# Patient Record
Sex: Male | Born: 1950 | Race: White
Health system: Southern US, Community
[De-identification: ages and names within clinical notes are randomized; demographics above are authoritative.]

## PROBLEM LIST (undated history)

## (undated) DIAGNOSIS — G459 Transient cerebral ischemic attack, unspecified: Secondary | ICD-10-CM

## (undated) DIAGNOSIS — R209 Unspecified disturbances of skin sensation: Secondary | ICD-10-CM

## (undated) DIAGNOSIS — M542 Cervicalgia: Secondary | ICD-10-CM

## (undated) DIAGNOSIS — I495 Sick sinus syndrome: Secondary | ICD-10-CM

## (undated) DIAGNOSIS — R0600 Dyspnea, unspecified: Secondary | ICD-10-CM

## (undated) DIAGNOSIS — R0609 Other forms of dyspnea: Secondary | ICD-10-CM

## (undated) DIAGNOSIS — I4891 Unspecified atrial fibrillation: Secondary | ICD-10-CM

## (undated) DIAGNOSIS — Z85828 Personal history of other malignant neoplasm of skin: Secondary | ICD-10-CM

## (undated) DIAGNOSIS — F419 Anxiety disorder, unspecified: Secondary | ICD-10-CM

## (undated) DIAGNOSIS — F32A Depression, unspecified: Secondary | ICD-10-CM

## (undated) DIAGNOSIS — I10 Essential (primary) hypertension: Secondary | ICD-10-CM

## (undated) DIAGNOSIS — E785 Hyperlipidemia, unspecified: Secondary | ICD-10-CM

## (undated) DIAGNOSIS — G43909 Migraine, unspecified, not intractable, without status migrainosus: Secondary | ICD-10-CM

## (undated) DIAGNOSIS — I77810 Thoracic aortic ectasia: Secondary | ICD-10-CM

## (undated) DIAGNOSIS — M549 Dorsalgia, unspecified: Secondary | ICD-10-CM

## (undated) DIAGNOSIS — J479 Bronchiectasis, uncomplicated: Secondary | ICD-10-CM

## (undated) DIAGNOSIS — I499 Cardiac arrhythmia, unspecified: Secondary | ICD-10-CM

## (undated) DIAGNOSIS — Z950 Presence of cardiac pacemaker: Secondary | ICD-10-CM

## (undated) DIAGNOSIS — N200 Calculus of kidney: Secondary | ICD-10-CM

## (undated) HISTORY — DX: Depression, unspecified: F32.A

## (undated) HISTORY — PX: CARDIAC PACEMAKER PLACEMENT: SHX583

## (undated) HISTORY — DX: Unspecified disturbances of skin sensation: R20.9

## (undated) HISTORY — DX: Other disorders of bilirubin metabolism: E80.6

## (undated) HISTORY — DX: Bronchiectasis, uncomplicated: J47.9

## (undated) HISTORY — DX: Transient cerebral ischemic attack, unspecified: G45.9

## (undated) HISTORY — DX: Migraine, unspecified, not intractable, without status migrainosus: G43.909

## (undated) HISTORY — DX: Cervicalgia: M54.2

## (undated) HISTORY — DX: Personal history of other malignant neoplasm of skin: Z85.828

## (undated) HISTORY — DX: Thoracic aortic ectasia: I77.810

## (undated) HISTORY — DX: Anxiety disorder, unspecified: F41.9

## (undated) HISTORY — DX: Presence of cardiac pacemaker: Z95.0

## (undated) HISTORY — DX: Dorsalgia, unspecified: M54.9

## (undated) SURGERY — CARDIAC CATHETERIZATION
Anesthesia: Local

---

## 1999-02-09 ENCOUNTER — Emergency Department: Admit: 1999-02-09 | Payer: Self-pay | Admitting: Emergency Medicine

## 2006-05-10 ENCOUNTER — Ambulatory Visit
Admit: 2006-05-10 | Disposition: A | Discharge status: Home / Self Care | Payer: Self-pay | Source: Ambulatory Visit | Admitting: Internal Medicine

## 2013-02-07 ENCOUNTER — Ambulatory Visit
Admission: RE | Admit: 2013-02-07 | Discharge: 2013-02-07 | Disposition: A | Discharge status: Home / Self Care | Payer: No Typology Code available for payment source | Source: Ambulatory Visit | Attending: Cardiology | Admitting: Cardiology

## 2013-02-07 DIAGNOSIS — I10 Essential (primary) hypertension: Secondary | ICD-10-CM | POA: Insufficient documentation

## 2013-02-07 DIAGNOSIS — I4891 Unspecified atrial fibrillation: Secondary | ICD-10-CM | POA: Insufficient documentation

## 2013-02-07 DIAGNOSIS — R079 Chest pain, unspecified: Secondary | ICD-10-CM | POA: Insufficient documentation

## 2013-02-07 DIAGNOSIS — I495 Sick sinus syndrome: Secondary | ICD-10-CM | POA: Insufficient documentation

## 2013-02-07 LAB — TSH: TSH: 3.18 (ref 0.35–4.94)

## 2013-02-22 ENCOUNTER — Emergency Department: Payer: No Typology Code available for payment source

## 2013-02-22 ENCOUNTER — Emergency Department
Admission: EM | Admit: 2013-02-22 | Discharge: 2013-02-22 | Disposition: A | Discharge status: Home / Self Care | Payer: No Typology Code available for payment source | Attending: Emergency Medicine | Admitting: Emergency Medicine

## 2013-02-22 DIAGNOSIS — I4891 Unspecified atrial fibrillation: Secondary | ICD-10-CM | POA: Insufficient documentation

## 2013-02-22 DIAGNOSIS — R9389 Abnormal findings on diagnostic imaging of other specified body structures: Secondary | ICD-10-CM | POA: Insufficient documentation

## 2013-02-22 DIAGNOSIS — I10 Essential (primary) hypertension: Secondary | ICD-10-CM | POA: Insufficient documentation

## 2013-02-22 DIAGNOSIS — R059 Cough, unspecified: Secondary | ICD-10-CM | POA: Insufficient documentation

## 2013-02-22 DIAGNOSIS — R042 Hemoptysis: Secondary | ICD-10-CM | POA: Insufficient documentation

## 2013-02-22 HISTORY — DX: Essential (primary) hypertension: I10

## 2013-02-22 HISTORY — DX: Unspecified atrial fibrillation: I48.91

## 2013-02-22 HISTORY — DX: Sick sinus syndrome: I49.5

## 2013-02-22 MED ORDER — HYDROCOD POLST-CPM POLST ER 10-8 MG/5ML PO LQCR
5.0000 mL | Freq: Two times a day (BID) | ORAL | Status: DC | PRN
Start: 2013-02-22 — End: 2013-08-23

## 2013-02-22 MED ORDER — HYDROCODONE-ACETAMINOPHEN 5-325 MG PO TABS
1.0000 | ORAL_TABLET | Freq: Once | ORAL | Status: AC
Start: 2013-02-22 — End: 2013-02-22
  Administered 2013-02-22: 1 via ORAL
  Filled 2013-02-22: qty 1

## 2013-02-22 MED ORDER — TUBERCULIN PPD 5 UNIT/0.1ML ID SOLN
0.1000 mL | Freq: Once | INTRADERMAL | Status: AC
Start: 2013-02-22 — End: 2013-02-22
  Administered 2013-02-22: 0.1 mL via INTRADERMAL
  Filled 2013-02-22: qty 0.1

## 2013-02-22 MED ORDER — GUAIFENESIN 100 MG/5ML PO SOLN
400.0000 mg | Freq: Once | ORAL | Status: AC
Start: 2013-02-22 — End: 2013-02-22
  Administered 2013-02-22: 400 mg via ORAL
  Filled 2013-02-22: qty 20

## 2013-02-22 MED ORDER — CEFUROXIME AXETIL 250 MG PO TABS
500.0000 mg | ORAL_TABLET | Freq: Once | ORAL | Status: AC
Start: 2013-02-22 — End: 2013-02-22
  Administered 2013-02-22: 500 mg via ORAL
  Filled 2013-02-22: qty 2

## 2013-02-22 MED ORDER — DEXTROMETHORPHAN-GUAIFENESIN ER 30-600 MG PO TB12
1.0000 | ORAL_TABLET | Freq: Two times a day (BID) | ORAL | Status: DC
Start: 2013-02-22 — End: 2013-08-23

## 2013-02-22 MED ORDER — CEFUROXIME AXETIL 500 MG PO TABS
500.0000 mg | ORAL_TABLET | Freq: Two times a day (BID) | ORAL | Status: AC
Start: 2013-02-22 — End: 2013-03-01

## 2013-02-22 NOTE — ED Provider Notes (Signed)
Physician/Midlevel provider first contact with patient: 02/22/13 0058         EMERGENCY DEPARTMENT NOTE    Physician/Midlevel provider first contact with patient: 02/22/13 0058         HISTORY OF PRESENT ILLNESS   Historian:Patient  Translator Used: No    HPI: This is a 62 y.o. male with Hx of HTN and AFib and in visit with wife is here with complaints of URI symptoms and severe heavy coughing spells with  hemoptysis x1 hour ago. Pt has been experiencing cold-like Sx contracted from grandchildren for the past x3 days including rhinorrhea and nasal congestion. He is currently on blood thinners for recent Dx of AFib. Pt was advised by physician to take cold and cough alka-seltzer with last dose at 2330, with mild relief. He then began to have Sx of hemoptysis, coughing 1 tbsp of serosanguinous fluid. No CP, SOB, vomiting.    1. Chief Complaint: hemoptysis  2. Onset of symptoms: x1 hour  3. What was patient doing when symptoms started (Context): + sick contacts.  Pt has also been working at [employer] and jails daily   4. Severity: moderate  5. Timing: constant  6. Activities that worsen symptoms: none  7. Activities that improve symptoms: none  8. Quality: aching  9. Radiation of symptoms: none  10. Associated signs and Symptoms: Associated Cough, body aches, rhinorrhea and nasal congestion. Denies SOB and wheezing  11. Are symptoms worsening? no  MEDICAL HISTORY     Past Medical History:  Past Medical History   Diagnosis Date   . Hypertension    . Sick sinus syndrome    . Atrial fibrillation        Past Surgical History:  History reviewed. No pertinent past surgical history.    Social History:  History     Social History   . Marital Status: Married     Spouse Name: N/A     Number of Children: N/A   . Years of Education: N/A     Occupational History   . Not on file.     Social History Main Topics   . Smoking status: Never Smoker    . Smokeless tobacco: Not on file   . Alcohol Use: Yes      Comment: socially   .  Drug Use: No   . Sexually Active: Not on file     Other Topics Concern   . Not on file     Social History Narrative   . No narrative on file       Family History:  Family History   Problem Relation Age of Onset   . Hypertension Mother    . Heart disease Father    . Stroke Father        Outpatient Medication:  Previous Medications    APIXABAN (ELIQUIS) 5 MG    Take 5 mg by mouth every 12 (twelve) hours.    ASPIRIN-SOD BICARB-CITRIC ACID (ALKA-SELTZER) 325 MG EFFER TAB    Take 325 mg by mouth every 6 (six) hours as needed.    LISINOPRIL (PRINIVIL,ZESTRIL) 10 MG TABLET    Take 10 mg by mouth daily.         REVIEW OF SYSTEMS     Review of Systems   Constitutional: Positive for fever, chills and body aches/ftigue  HENT: Positive nasal congestion and rhinorrhea. Negative for  Ear pains: Negative for eye discharge  Respiratory: Positive  for cough. Negative shortness of breath and  wheezing  . Positive for hemoptysis.  Cardiovascular: Negative for chest pain  Gastrointestinal: Negative for  Nausea, vomiting and diarrhea  Musculoskeletal: Positive generalized rash.   All other systems reviewed and are negative.      PHYSICAL EXAM     Filed Vitals:    02/22/13 0051   BP: 156/78   Pulse: 67   Temp: 98.2 F (36.8 C)   Resp: 20   SpO2: 98%       Physical Exam   Nursing note and vitals reviewed.  Constitutional: Pt is well-developed, well-nourished  In minimal pain distress. Posterior pharyngeal wall shows blood clot stringing.   Head:  Normocephalic and atraumatic.   Nose: Clear rhinorrhea with  crusting and  bilateral nasal mucosal edema  Mouth/Throat: Oropharynx is erythematous and slightly injected. No exudate present.    Ear-TM - clear  Eyes: Conjunctivae normal and EOM are normal. Pupils are equal, round, and reactive to light.   Neck: Normal range of motion. Neck supple. No mass   Cardiovascular: Regular rate, irregular rhythm, and normal heart sounds.    Pulmonary/Chest: Effort normal and breath sounds normal. No  respiratory distress.   Abdominal: Soft. Normal appearance and bowel sounds are normal. Pt exhibits no distension. There is no tenderness.   Musculoskeletal: Normal range of motion. Pt  exhibits no edema and no tenderness.   Lymphadenopathy:     Pt has no anterior cervical adenopathy.   Neurological: No focal neuro deficits.   Skin: Skin is warm, dry and intact. No rash noted.    MEDICAL DECISION MAKING   MDM:  URI with flu-like symptoms, Fever, Cough  DDx:  Influenza,  URI, Strep Throat, Allergic Rhinitis,  PLAN:  CXR, cough/ pain med and fever control PRN  reassess.  CT due to abnormal CXR and consult Infectious DZ due to concern for TB    REASSESSMENT   Symptoms: feels improved  Exam: resting comfortably-      RUE:AVWU/ Bronchitissymptoms - Patient presents with upper respiratory and flulike symptoms. Based on my assessment in the ED, I do not suspect any respiratory, airway, pulmonary, cardiovascular (including myocarditis), metabolic, CNS, medical, or surgical emergency medical condition. I have discussed with the patient and/or caregiver signs and symptoms for secondary bacterial infections, such as pneumonia. I believe that the patient's symptoms are most consistent with a viral illness, possibly influenza. Patient is safe for discharge home with conservative therapy.     I have discussed the physical findings, labs, radiological findings, diagnosis and plan of care with the patient and/or family and they have verbally expressed understanding and agreement with this management.        DISCUSSION        Vital Signs: Reviewed the patient?s vital signs.   Nursing Notes: Reviewed and utilized available nursing notes.  Medical Records Reviewed: Reviewed available past medical records.  Counseling: The emergency provider has spoken with the patient and discussed today?s findings, in addition to providing specific details for the plan of care.  Questions are answered and there is agreement with the plan.       CONSULTATIONS     DrDan Humphreys- ID Specialist.  Instructed to place ppd, start ceftin, send sputum cx and refer to Pulmonary for further evaluation.  TB less likely due to lack of fever, weight loss and sweats        IMAGING STUDIES    The following imaging studies were independently interpreted by the Emergency Medicine Physician.  For full imaging  study results please see chart.        PULSE OXIMETRY    Oxygen Saturation by Pulse Oximetry: 98%  Interventions: none  Interpretation: normal  Interpreted independently by Emergency Physician      EMERGENCY DEPT. MEDICATIONS      ED Medication Orders     None          LABORATORY RESULTS    Ordered and independently interpreted AVAILABLE laboratory tests. Please see results section in chart for full details.  Results for orders placed during the hospital encounter of 02/07/13   TSH       Component Value Range    Thyroid Stimulating Hormone 3.18  0.35 - 4.94          CRITICAL CARE        ATTESTATIONS      I have discussed the physical findings available labs,  diagnosis and plan of care with the patient and/or family and they have verbally expressed understanding and agreement with this management.    DIAGNOSIS      Diagnosis:  Final diagnoses:   None       Disposition:  ED Disposition     None          Prescriptions      Varney Daily, MD  02/22/13 [phone]

## 2013-02-22 NOTE — ED Notes (Signed)
Ambulatory alert orientedx4 no sob, c/o of colds for 2-3 days, today at midnite started to cough out blood.bright blood , thick and clotted.

## 2013-02-22 NOTE — ED Notes (Signed)
Patient was moved from room 13 to room 10 for positive pressure room.

## 2013-02-22 NOTE — ED Notes (Signed)
Patient states that his throat pain feels better after the medication

## 2013-02-24 ENCOUNTER — Emergency Department: Payer: No Typology Code available for payment source

## 2013-02-24 ENCOUNTER — Emergency Department
Admission: EM | Admit: 2013-02-24 | Discharge: 2013-02-24 | Disposition: A | Discharge status: Home / Self Care | Payer: No Typology Code available for payment source | Attending: Emergency Medicine | Admitting: Emergency Medicine

## 2013-02-24 DIAGNOSIS — Z111 Encounter for screening for respiratory tuberculosis: Secondary | ICD-10-CM | POA: Insufficient documentation

## 2013-02-24 DIAGNOSIS — I10 Essential (primary) hypertension: Secondary | ICD-10-CM | POA: Insufficient documentation

## 2013-02-24 DIAGNOSIS — R059 Cough, unspecified: Secondary | ICD-10-CM | POA: Insufficient documentation

## 2013-02-24 DIAGNOSIS — I4891 Unspecified atrial fibrillation: Secondary | ICD-10-CM | POA: Insufficient documentation

## 2013-02-24 DIAGNOSIS — R042 Hemoptysis: Secondary | ICD-10-CM | POA: Insufficient documentation

## 2013-02-24 NOTE — ED Notes (Signed)
Pt was here two days ago for cough evalv.; Cough is improving; here with sputum sample to have PPD read.

## 2013-02-24 NOTE — ED Provider Notes (Signed)
Physician/Midlevel provider first contact with patient: 02/24/13 0501         EMERGENCY DEPARTMENT NOTE    Physician/Midlevel provider first contact with patient: 02/24/13 0501         HISTORY OF PRESENT ILLNESS   Historian:Patient  Translator Used: No    HPI: This is a 62 y.o. male  here with complaints of Cough seen in ED 2 days ago for hemoptysis and had sputums sent and PPD placed.  Cough improved.  Hemoptysis decreasing, now more clear sputum expectorated    1. Chief Complaint: Cough  2. Onset of symptoms:  day  3. What was patient doing when symptoms started (Context): denies trauma.   4. Severity: improving  5. Timing: constant  6. Activities that worsen symptoms:none  7. Activities that improve symptoms: none  8. Quality: aching  9. Radiation of symptoms:none  10. Associated signs and Symptoms: No dizziness,  No F/C, focal numbness or weakness  11. Are symptoms worsening? no  MEDICAL HISTORY     Past Medical History:  Past Medical History   Diagnosis Date   . Hypertension    . Sick sinus syndrome    . Atrial fibrillation        Past Surgical History:  History reviewed. No pertinent past surgical history.    Social History:  History     Social History   . Marital Status: Married     Spouse Name: N/A     Number of Children: N/A   . Years of Education: N/A     Occupational History   . Not on file.     Social History Main Topics   . Smoking status: Never Smoker    . Smokeless tobacco: Not on file   . Alcohol Use: Yes      Comment: socially   . Drug Use: No   . Sexually Active: Not on file     Other Topics Concern   . Not on file     Social History Narrative   . No narrative on file       Family History:  Family History   Problem Relation Age of Onset   . Hypertension Mother    . Heart disease Father    . Stroke Father        Outpatient Medication:  Previous Medications    APIXABAN (ELIQUIS) 5 MG    Take 5 mg by mouth every 12 (twelve) hours.    CEFUROXIME (CEFTIN) 500 MG TABLET    Take 1 tablet (500 mg total)  by mouth 2 (two) times daily.    DEXTROMETHORPHAN-GUAIFENESIN (MUCINEX DM) 30-600 MG PER 12 HR TABLET    Take 1 tablet by mouth every 12 (twelve) hours.    HYDROCODONE-CHLORPHENIRAMINE (TUSSIONEX PENNKINETIC ER) 10-8 MG/5ML SUSPENSION    Take 5 mLs by mouth every 12 (twelve) hours as needed.    LISINOPRIL (PRINIVIL,ZESTRIL) 10 MG TABLET    Take 10 mg by mouth daily.         REVIEW OF SYSTEMS   Review of Systems   Constitutional: Negative for fever and chills.    Respiratory: Positive for cough and negative shortness of breath.    Cardiovascular: Negative for chest pain.   Gastrointestinal: Negative for nausea, vomiting, abdominal pain and diarrhea.   Musculoskeletal: Negative for back pain.   Skin: Negative for rash.   Neurological:  Negative for sensory change and focal weakness.   All other systems reviewed and are negative.  PHYSICAL EXAM     Filed Vitals:    02/24/13 0522   BP: 157/79   Pulse: 58   Temp: 97.7 F (36.5 C)   Resp: 18   SpO2: 98%       Physical Exam   Nursing note and vitals reviewed.  Constitutional: Pt is  well-developed, well-nourished, and in  moderate  pain distress . Nontoxic   ENT: Nose normal.Moist mucous membranes.   Lungs: CTA bilaterally    Neurological: Pt is alert and oriented to person, place, and time. No focal neuro deficits   Skin: Skin is warm, dry and intact.   Psychiatric: Affect appropropriate      MEDICAL DECISION MAKING   MDM:  Cough with Hemoptysis   DDx:  TB, infectious process, bronchitis,   PLAN: will read PPD  Previous visits and medical history reviewed     REASSESSMENT PRIOR TO DISPOSITION  Symptoms: Improving  Exam: Resting comfortably    I have discussed the physical findings, labs, radiological findings, diagnosis and plan of care with the patient and/or family and they have verbally expressed understanding and agreement with this management.    DISCUSSION    MDM: Cough  D/C Precautions - I discussed with patient and/or family/caretaker that evaluation in the  ED does not suggest any emergent or life threatening condition medical condition requiring immediate intervention beyond what was provided in the ED, and I believe patient is safe for discharge.  Regardless, an unremarkable evaluation in the ED does not preclude the development or presence of a serious of life threatening condition. As such, patient was instructed to return immediately for any worsening or change in current symptoms.     Vital Signs: Reviewed the patient?s vital signs.   Nursing Notes: Reviewed and utilized available nursing notes.  Medical Records Reviewed: Reviewed available past medical records.  Counseling: The emergency provider has spoken with the patient and discussed today?s findings, in addition to providing specific details for the plan of care.  Questions are answered and there is agreement with the plan.      PROCEDURES          IMAGING STUDIES    The following imaging studies were independently interpreted by the Emergency Medicine Physician.  For full imaging study results please see chart.        PULSE OXIMETRY    Oxygen Saturation by Pulse Oximetry: 98%  Interventions: none  Interpretation: normal  Interpreted independently by Emergency Physician      EMERGENCY DEPT. MEDICATIONS      ED Medication Orders     None          LABORATORY RESULTS    Ordered and independently interpreted AVAILABLE laboratory tests. Please see results section in chart for full details.  Results for orders placed during the hospital encounter of 02/07/13   TSH       Component Value Range    Thyroid Stimulating Hormone 3.18  0.35 - 4.94         CRITICAL CARE        ATTESTATIONS      I have discussed the physical findings available labs/radiological findings, diagnosis and plan of care with the patient and/or family and they have verbally expressed understanding and agreement with this management.    DIAGNOSIS      Diagnosis:  Final diagnoses:   Cough with hemoptysis   Encounter for PPD skin test reading        Disposition:  ED Disposition  Discharge [name] discharge to home/self care.    Condition at discharge: IImproved            Prescriptions:    Jeremy Johann Rashidah, MD  02/24/13 [phone]

## 2013-03-18 ENCOUNTER — Other Ambulatory Visit
Admission: RE | Admit: 2013-03-18 | Discharge: 2013-03-18 | Disposition: A | Discharge status: Home / Self Care | Payer: No Typology Code available for payment source | Source: Ambulatory Visit | Attending: Internal Medicine | Admitting: Internal Medicine

## 2013-03-18 DIAGNOSIS — I4891 Unspecified atrial fibrillation: Secondary | ICD-10-CM | POA: Insufficient documentation

## 2013-03-18 LAB — PT/INR
PT INR: 1.2 — ABNORMAL HIGH (ref 0.9–1.1)
PT: 14.8 (ref 12.6–15.0)

## 2013-03-22 ENCOUNTER — Encounter (INDEPENDENT_AMBULATORY_CARE_PROVIDER_SITE_OTHER): Payer: Self-pay

## 2013-03-22 ENCOUNTER — Other Ambulatory Visit
Admission: RE | Admit: 2013-03-22 | Discharge: 2013-03-22 | Disposition: A | Discharge status: Home / Self Care | Payer: No Typology Code available for payment source | Source: Ambulatory Visit

## 2013-03-22 LAB — PT/INR
PT INR: 1.1 (ref 0.9–1.1)
PT: 14.4 (ref 12.6–15.0)

## 2013-03-26 ENCOUNTER — Other Ambulatory Visit
Admission: RE | Admit: 2013-03-26 | Discharge: 2013-03-26 | Disposition: A | Discharge status: Home / Self Care | Payer: No Typology Code available for payment source | Source: Ambulatory Visit

## 2013-03-26 LAB — PT/INR
PT INR: 1.2 — ABNORMAL HIGH (ref 0.9–1.1)
PT: 14.6 (ref 12.6–15.0)

## 2013-03-28 ENCOUNTER — Other Ambulatory Visit
Admission: RE | Admit: 2013-03-28 | Discharge: 2013-03-28 | Disposition: A | Discharge status: Home / Self Care | Payer: No Typology Code available for payment source | Source: Ambulatory Visit

## 2013-03-28 LAB — PT/INR
PT INR: 1.2 — ABNORMAL HIGH (ref 0.9–1.1)
PT: 14.8 (ref 12.6–15.0)

## 2013-04-08 ENCOUNTER — Other Ambulatory Visit
Admission: RE | Admit: 2013-04-08 | Discharge: 2013-04-08 | Disposition: A | Discharge status: Home / Self Care | Payer: No Typology Code available for payment source | Source: Ambulatory Visit | Attending: Cardiology | Admitting: Cardiology

## 2013-04-08 DIAGNOSIS — I4891 Unspecified atrial fibrillation: Secondary | ICD-10-CM | POA: Insufficient documentation

## 2013-04-08 LAB — PT/INR
PT INR: 1.3 — ABNORMAL HIGH (ref 0.9–1.1)
PT: 15.9 — ABNORMAL HIGH (ref 12.6–15.0)

## 2013-04-19 ENCOUNTER — Other Ambulatory Visit
Admission: RE | Admit: 2013-04-19 | Discharge: 2013-04-19 | Disposition: A | Discharge status: Home / Self Care | Payer: No Typology Code available for payment source | Source: Ambulatory Visit

## 2013-04-19 LAB — PT/INR
PT INR: 1.5 — ABNORMAL HIGH (ref 0.9–1.1)
PT: 17.8 — ABNORMAL HIGH (ref 12.6–15.0)

## 2013-05-06 ENCOUNTER — Other Ambulatory Visit
Admission: RE | Admit: 2013-05-06 | Discharge: 2013-05-06 | Disposition: A | Discharge status: Home / Self Care | Payer: No Typology Code available for payment source | Source: Ambulatory Visit | Attending: Cardiology | Admitting: Cardiology

## 2013-05-06 DIAGNOSIS — I4891 Unspecified atrial fibrillation: Secondary | ICD-10-CM | POA: Insufficient documentation

## 2013-05-06 LAB — PT/INR
PT INR: 1.4 — ABNORMAL HIGH (ref 0.9–1.1)
PT: 17.3 — ABNORMAL HIGH (ref 12.6–15.0)

## 2013-05-24 ENCOUNTER — Other Ambulatory Visit
Admission: RE | Admit: 2013-05-24 | Discharge: 2013-05-24 | Disposition: A | Discharge status: Home / Self Care | Payer: No Typology Code available for payment source | Source: Ambulatory Visit

## 2013-05-24 LAB — PT/INR
PT INR: 1.5 — ABNORMAL HIGH (ref 0.9–1.1)
PT: 18.1 — ABNORMAL HIGH (ref 12.6–15.0)

## 2013-06-11 ENCOUNTER — Other Ambulatory Visit
Admission: RE | Admit: 2013-06-11 | Discharge: 2013-06-11 | Disposition: A | Discharge status: Home / Self Care | Payer: No Typology Code available for payment source | Source: Ambulatory Visit | Attending: Cardiology | Admitting: Cardiology

## 2013-06-11 DIAGNOSIS — I4891 Unspecified atrial fibrillation: Secondary | ICD-10-CM | POA: Insufficient documentation

## 2013-06-11 LAB — PT/INR
PT INR: 2 — ABNORMAL HIGH (ref 0.9–1.1)
PT: 22.7 — ABNORMAL HIGH (ref 12.6–15.0)

## 2013-06-24 ENCOUNTER — Ambulatory Visit
Admission: RE | Admit: 2013-06-24 | Discharge: 2013-06-24 | Disposition: A | Discharge status: Home / Self Care | Payer: No Typology Code available for payment source | Source: Ambulatory Visit | Attending: Cardiology | Admitting: Cardiology

## 2013-06-24 DIAGNOSIS — I4891 Unspecified atrial fibrillation: Secondary | ICD-10-CM | POA: Insufficient documentation

## 2013-06-24 LAB — PT/INR
PT INR: 1.8 — ABNORMAL HIGH (ref 0.9–1.1)
PT: 20.2 — ABNORMAL HIGH (ref 12.6–15.0)

## 2013-07-09 ENCOUNTER — Other Ambulatory Visit
Admission: RE | Admit: 2013-07-09 | Discharge: 2013-07-09 | Disposition: A | Discharge status: Home / Self Care | Payer: No Typology Code available for payment source | Source: Ambulatory Visit | Attending: Cardiology | Admitting: Cardiology

## 2013-07-09 DIAGNOSIS — I4891 Unspecified atrial fibrillation: Secondary | ICD-10-CM | POA: Insufficient documentation

## 2013-07-09 LAB — PT/INR
PT INR: 1.9 — ABNORMAL HIGH (ref 0.9–1.1)
PT: 21.7 — ABNORMAL HIGH (ref 12.6–15.0)

## 2013-07-19 ENCOUNTER — Other Ambulatory Visit
Admission: RE | Admit: 2013-07-19 | Discharge: 2013-07-19 | Disposition: A | Discharge status: Home / Self Care | Payer: No Typology Code available for payment source | Source: Ambulatory Visit

## 2013-07-19 LAB — PT/INR
PT INR: 2 — ABNORMAL HIGH (ref 0.9–1.1)
PT: 22.5 — ABNORMAL HIGH (ref 12.6–15.0)

## 2013-08-09 ENCOUNTER — Encounter (INDEPENDENT_AMBULATORY_CARE_PROVIDER_SITE_OTHER): Payer: Self-pay

## 2013-08-16 ENCOUNTER — Other Ambulatory Visit
Admission: RE | Admit: 2013-08-16 | Discharge: 2013-08-16 | Disposition: A | Discharge status: Home / Self Care | Payer: No Typology Code available for payment source | Source: Ambulatory Visit | Attending: Cardiology | Admitting: Cardiology

## 2013-08-16 DIAGNOSIS — I4891 Unspecified atrial fibrillation: Secondary | ICD-10-CM | POA: Insufficient documentation

## 2013-08-16 LAB — PT/INR
PT INR: 2.1 — ABNORMAL HIGH (ref 0.9–1.1)
PT: 23 — ABNORMAL HIGH (ref 12.6–15.0)

## 2013-08-19 ENCOUNTER — Ambulatory Visit (INDEPENDENT_AMBULATORY_CARE_PROVIDER_SITE_OTHER): Payer: Self-pay

## 2013-08-19 LAB — PT/INR
PT INR: 2.1
PT INR: 2.1

## 2013-08-23 ENCOUNTER — Encounter (INDEPENDENT_AMBULATORY_CARE_PROVIDER_SITE_OTHER): Payer: Self-pay | Admitting: Cardiology

## 2013-08-23 ENCOUNTER — Ambulatory Visit (INDEPENDENT_AMBULATORY_CARE_PROVIDER_SITE_OTHER): Payer: No Typology Code available for payment source | Admitting: Cardiology

## 2013-08-23 VITALS — BP 135/64 | HR 57 | Ht 73.0 in | Wt 218.0 lb

## 2013-08-23 DIAGNOSIS — I495 Sick sinus syndrome: Secondary | ICD-10-CM

## 2013-08-23 DIAGNOSIS — I10 Essential (primary) hypertension: Secondary | ICD-10-CM

## 2013-08-23 DIAGNOSIS — I4891 Unspecified atrial fibrillation: Secondary | ICD-10-CM

## 2013-08-23 NOTE — Progress Notes (Signed)
[facility]    Chief Complaint   Patient presents with   . Hypertension   . Atrial Fibrillation   . Bradycardia         History of Present Illness     Jonathan [name] has some limitation in his exercise tolerance that he feels could be related to the afib. No problem at rest. The patient denies chest discomfort, shortness of breath, racing heart beat, syncope or near syncope.    The patient has a history of hypertension, sick sinus syndrome with afib rates in 50s-60s.       Past Medical History     Past Medical History   Diagnosis Date   . Hypertension    . Sick sinus syndrome    . Atrial fibrillation        Past Surgical History     History reviewed. No pertinent past surgical history.    Family History     Family History   Problem Relation Age of Onset   . Hypertension Mother    . Heart disease Father    . Stroke Father        Social History     History     Social History   . Marital Status: Married     Spouse Name: N/A     Number of Children: N/A   . Years of Education: N/A     Occupational History   . Not on file.     Social History Main Topics   . Smoking status: Never Smoker    . Smokeless tobacco: Not on file   . Alcohol Use: Yes      Comment: socially   . Drug Use: No   . Sexually Active: Not on file     Other Topics Concern   . Not on file     Social History Narrative   . No narrative on file       Allergies     No Known Allergies    Medications     Current Outpatient Prescriptions on File Prior to Visit   Medication Sig Dispense Refill   . [DISCONTINUED] apixaban (ELIQUIS) 5 MG Take 5 mg by mouth every 12 (twelve) hours.       . [DISCONTINUED] dextromethorphan-guaifenesin (MUCINEX DM) 30-600 MG per 12 hr tablet Take 1 tablet by mouth every 12 (twelve) hours.  14 tablet  0   . [DISCONTINUED] hydrocodone-chlorpheniramine (TUSSIONEX PENNKINETIC ER) 10-8 MG/5ML suspension Take 5 mLs by mouth every 12 (twelve) hours as needed.  60 mL  0   . [DISCONTINUED] lisinopril (PRINIVIL,ZESTRIL) 10 MG tablet  Take 10 mg by mouth daily.           Review of Systems     Constitutional: Negative for fevers and chills  Skin: No rash or lesions  Respiratory: Negative for cough, wheezing, or hemoptysis  Cardiovascular: as per HPI  Gastrointestinal: Negative for abdominal pain, nausea, vomiting and diarrhea  Musculoskeletal:  No arthritic symptoms  Genitourinary: Negative for dysuria  Otherwise 10 point review of systems is negative.      Physical Exam     Filed Vitals:    08/23/13 1304   BP: 135/64   Pulse: 57       Body mass index is 28.77 kg/(m^2).    General:  Patient appears their stated age, well-nourished.  Alert and in no apparent distress.  Eyes: No conjunctivitis, no purulent discharge, no lid lag  ENT:  Hearing  grossly intact, Nares patent bilaterally, Lips moist, color appropriate for race.  Respiratory: Clear to auscultation and percussion throughout. Respiratory effort unlabored, chest expansion symmetric.    Cardio: Regular rate and rhythm. Normal S1/S2 No carotid bruits or thrills, no JVD.  Extremities: warm, pulses 2+, no peripheral edema  GI: Soft, nondistended, nontender.  No guarding or rebound.  Skin: Color appropriate for race, Skin warm, dry, and intact  Psychiatric: Good insight and judgment, oriented to person, place, and time    Labs     CBC:   No results found for this basename: WBC, RBC, HGB, HCT, MCV, MCHC, RDW, PLT       CMP:   No results found for this basename: NA, K, CL, CO2, GLU, BUN, CREATININE, CALCIUM, PROT, ALBUMIN, BILITOT, ALKPHOS, AST, ALT, ANIONGAP, GRFNONAFAMER, GFRAFAMER       Lipid Panel   No results found for this basename: chol, trig, hdl, ldlc, vldlc         EKG   I have reviewed and interpreted the EKG.  The EKG is significant for afib rate 51 b/m.     Assessment and Plan     1. Atrial fibrillation, since 01/2013 with rates on slow side 30-111 on holter monitor 01/2013.   2. Hypertension adequately controlled.    PLAN:  The patient has some business to attend to and so cannot do  cardioversion currently. Will see the patient back in 2 months and reassess his anticoagulation status at that point. At this point the patient has been anticoagulated for a month but pt does not want to do the cardioversion now.

## 2013-08-26 ENCOUNTER — Encounter (INDEPENDENT_AMBULATORY_CARE_PROVIDER_SITE_OTHER): Payer: Self-pay | Admitting: Cardiology

## 2013-08-27 ENCOUNTER — Encounter (INDEPENDENT_AMBULATORY_CARE_PROVIDER_SITE_OTHER): Payer: Self-pay | Admitting: Cardiology

## 2013-09-26 ENCOUNTER — Other Ambulatory Visit
Admission: RE | Admit: 2013-09-26 | Discharge: 2013-09-26 | Disposition: A | Discharge status: Home / Self Care | Payer: No Typology Code available for payment source | Source: Ambulatory Visit | Attending: Cardiology | Admitting: Cardiology

## 2013-09-26 DIAGNOSIS — I4891 Unspecified atrial fibrillation: Secondary | ICD-10-CM | POA: Insufficient documentation

## 2013-09-26 LAB — PT/INR
PT INR: 2.3 — ABNORMAL HIGH (ref 0.9–1.1)
PT: 24.8 s — ABNORMAL HIGH (ref 12.6–15.0)

## 2013-09-27 ENCOUNTER — Ambulatory Visit (INDEPENDENT_AMBULATORY_CARE_PROVIDER_SITE_OTHER): Payer: Self-pay

## 2013-09-27 NOTE — Progress Notes (Signed)
Diagnosis:  atrial fibrillation/flutter  INR range:  2.0-3.0  Primary cardiologist:  Dr Franchot Erichsen  Attention:  may leave message on machine  ______________________________    INR = 2.3, date: Feb 26 , day Thursday  Comment:   none    New dose?  No  Sun 7.5, Mon 10, Tue 10, Wed 10, Thu 10, Fri 10, Sat 7.5  Next INR:  4 week(s)    Spoke to:  left message on machine  Verbalized understanding:  No  Bill 4th INR No 1/4  _______________________________

## 2013-10-21 ENCOUNTER — Telehealth (INDEPENDENT_AMBULATORY_CARE_PROVIDER_SITE_OTHER): Payer: Self-pay

## 2013-10-21 NOTE — Telephone Encounter (Signed)
Jonathan [name] presented to the lobby and said that he has had some fluctuations in his bp with his morning bp running higher. He indicates he takes his losartan early in the morning, then his bp has been as high as bps 170. I advised him to make an appointment to see dr Franchot Erichsen and discuss his medications. He states understanding.

## 2013-10-24 ENCOUNTER — Encounter (INDEPENDENT_AMBULATORY_CARE_PROVIDER_SITE_OTHER): Payer: Self-pay | Admitting: Cardiology

## 2013-10-24 ENCOUNTER — Ambulatory Visit (INDEPENDENT_AMBULATORY_CARE_PROVIDER_SITE_OTHER): Payer: No Typology Code available for payment source | Admitting: Cardiology

## 2013-10-24 VITALS — BP 116/74 | HR 56 | Resp 18 | Ht 73.0 in | Wt 207.0 lb

## 2013-10-24 DIAGNOSIS — R0789 Other chest pain: Secondary | ICD-10-CM

## 2013-10-24 DIAGNOSIS — I10 Essential (primary) hypertension: Secondary | ICD-10-CM

## 2013-10-24 DIAGNOSIS — I4891 Unspecified atrial fibrillation: Secondary | ICD-10-CM

## 2013-10-24 DIAGNOSIS — I495 Sick sinus syndrome: Secondary | ICD-10-CM

## 2013-10-24 NOTE — Progress Notes (Signed)
[facility] PROGRESS NOTE    I had the pleasure of seeing Jonathan [name] today for cardiovascular follow up. He is a pleasant 63 y.o. male with a history of atrial fibrillation found 7/14 with no near syncope with av nodal dysfunction (pt does not tolerate av nodal blocking agents) who presents for chest discomfort. Over the past week the patient has awakened at times in the early morning hours with chest tightness sensations lasting 5 min or so. No sob.          MEDICATIONS:  Current outpatient prescriptions:losartan (COZAAR) 50 MG tablet, Take 50 mg by mouth daily., Disp: , Rfl: ;  warfarin (COUMADIN) 10 MG tablet, Take 10 mg by mouth daily., Disp: , Rfl: ;  warfarin (COUMADIN) 7.5 MG tablet, Take 7.5 mg by mouth daily., Disp: , Rfl:        REVIEW OF SYSTEMS: All other systems reviewed and negative except as above.    PHYSICAL EXAMINATION  General Appearance: well-appearing and in no acute distress.   Vital Signs: BP 116/74  Pulse 56  Resp 18  Ht 1.854 m (6\' 1" )  Wt 93.895 kg (207 lb)  BMI 27.32 kg/m2   HEENT: Sclera anicteric, conjunctiva without pallor, moist mucous membranes.  Neck: Supple without jugular venous distention.  Chest: Clear to auscultation bilaterally with good air movement and respiratory effort and no wheezes, rales, or rhonchi  Cardiovascular: Normal S1 and  S2 without murmurs, gallops or rub. PMI of normal size and nondisplaced.   Abdomen: Soft, nontender.  Extremities: Warm without edema.   Skin: No rash, xanthoma or xanthelasma.   Neuro: Alert and oriented x3. Grossly intact. Strength is symmetrical. Normal mood and affect.     ECG: afib rate 51 no stt changes.   Past Medical History   Diagnosis Date   . Hypertension    . Sick sinus syndrome    . Atrial fibrillation 7/14 dx     holter7/14 rates 30-111     Family History   Problem Relation Age of Onset   . Hypertension Mother    . Heart disease Father    . Stroke Father      History     Social History   . Marital Status: Married      Spouse Name: N/A     Number of Children: N/A   . Years of Education: N/A     Social History Main Topics   . Smoking status: Never Smoker    . Smokeless tobacco: None   . Alcohol Use: Yes      Comment: socially   . Drug Use: No   . Sexually Active: None     Other Topics Concern   . None     Social History Narrative   . None       LABS:  CBC:   No results found for this basename: WBC, RBC, HGB, HCT, MCV, MCHC, RDW, PLT       CMP:   No results found for this basename: NA, K, CL, CO2, GLU, BUN, CREATININE, CALCIUM, PROT, ALBUMIN, BILITOT, ALKPHOS, AST, ALT, ANIONGAP, GRFNONAFAMER, GFRAFAMER       Lipid Panel   No results found for this basename: chol, trig, hdl, ldlc, vldlc         IMPRESSION:  Atrial fibrillation for at least since July 2014.   Sinus node dysfunction and av nodal dysfunction  Hypertension controlled in the office and variable at home, can be as high as 180  systolic but below 140 most of the time in the evening.   Chest discomfort, atypical.      PLAN:   Pt has elected not to have a stress nuclear scan  Will continue  Current rx.  Pt to return for follow in a month. Pt will consider this.  Pt declines a 24 hour monitor    Royann Shivers, MD   10/24/2013

## 2013-10-31 ENCOUNTER — Ambulatory Visit
Admission: RE | Admit: 2013-10-31 | Discharge: 2013-10-31 | Disposition: A | Discharge status: Home / Self Care | Payer: No Typology Code available for payment source | Source: Ambulatory Visit | Attending: Cardiology | Admitting: Cardiology

## 2013-10-31 ENCOUNTER — Other Ambulatory Visit (INDEPENDENT_AMBULATORY_CARE_PROVIDER_SITE_OTHER): Payer: Self-pay

## 2013-10-31 DIAGNOSIS — I4891 Unspecified atrial fibrillation: Secondary | ICD-10-CM

## 2013-10-31 LAB — PT/INR
PT INR: 2.7 — ABNORMAL HIGH (ref 0.9–1.1)
PT: 28.4 s — ABNORMAL HIGH (ref 12.6–15.0)

## 2013-11-04 ENCOUNTER — Ambulatory Visit (INDEPENDENT_AMBULATORY_CARE_PROVIDER_SITE_OTHER): Payer: Self-pay

## 2013-11-04 NOTE — Progress Notes (Signed)
Diagnosis: atrial fibrillation/flutter   INR range: 2.0-3.0   Primary cardiologist: Dr Franchot Erichsen   Attention: may leave message on machine   ______________________________   INR = 2.7, date: Arpil 2 , day Thursday   Comment: none   New dose? No   Sun 7.5, Mon 10, Tue 10, Wed 10, Thu 10, Fri 10, Sat 7.5   Next INR: 4 week(s)   Spoke to: spoke to Jonathan [name] and he states understanding    Verbalized understanding: yes      _______________________________

## 2013-12-06 ENCOUNTER — Ambulatory Visit
Admission: RE | Admit: 2013-12-06 | Discharge: 2013-12-06 | Disposition: A | Discharge status: Home / Self Care | Payer: No Typology Code available for payment source | Source: Ambulatory Visit | Attending: Internal Medicine | Admitting: Internal Medicine

## 2013-12-06 ENCOUNTER — Other Ambulatory Visit
Admission: RE | Admit: 2013-12-06 | Discharge: 2013-12-06 | Disposition: A | Discharge status: Home / Self Care | Payer: No Typology Code available for payment source | Source: Ambulatory Visit | Attending: Cardiology | Admitting: Cardiology

## 2013-12-06 DIAGNOSIS — I4891 Unspecified atrial fibrillation: Secondary | ICD-10-CM | POA: Insufficient documentation

## 2013-12-06 DIAGNOSIS — Z0000 Encounter for general adult medical examination without abnormal findings: Secondary | ICD-10-CM | POA: Insufficient documentation

## 2013-12-06 LAB — PT/INR
PT INR: 2.6 — ABNORMAL HIGH (ref 0.9–1.1)
PT: 27.7 s — ABNORMAL HIGH (ref 12.6–15.0)

## 2013-12-06 LAB — COMPREHENSIVE METABOLIC PANEL
ALT: 43 U/L (ref 0–55)
AST (SGOT): 40 U/L — ABNORMAL HIGH (ref 5–34)
Albumin/Globulin Ratio: 1.5 (ref 0.9–2.2)
Albumin: 4.4 g/dL (ref 3.5–5.0)
Alkaline Phosphatase: 84 U/L (ref 38–106)
BUN: 12 mg/dL (ref 9.0–28.0)
Bilirubin, Total: 1.5 mg/dL — ABNORMAL HIGH (ref 0.1–1.2)
CO2: 24 mEq/L (ref 21–30)
Calcium: 10 mg/dL (ref 8.5–10.5)
Chloride: 99 mEq/L — ABNORMAL LOW (ref 100–111)
Creatinine: 0.8 mg/dL (ref 0.5–1.5)
Globulin: 3 g/dL (ref 2.0–3.7)
Glucose: 89 mg/dL (ref 70–100)
Potassium: 5.2 mEq/L (ref 3.5–5.3)
Protein, Total: 7.4 g/dL (ref 6.0–8.3)
Sodium: 133 mEq/L — ABNORMAL LOW (ref 135–146)

## 2013-12-06 LAB — HEMOLYSIS INDEX: Hemolysis Index: 9 (ref 0–18)

## 2013-12-06 LAB — GFR: EGFR: >60

## 2013-12-10 ENCOUNTER — Other Ambulatory Visit: Payer: Self-pay | Admitting: Gastroenterology

## 2013-12-10 ENCOUNTER — Ambulatory Visit
Admission: RE | Admit: 2013-12-10 | Discharge: 2013-12-10 | Disposition: A | Discharge status: Home / Self Care | Payer: No Typology Code available for payment source | Source: Ambulatory Visit | Attending: Gastroenterology | Admitting: Gastroenterology

## 2013-12-10 ENCOUNTER — Ambulatory Visit (INDEPENDENT_AMBULATORY_CARE_PROVIDER_SITE_OTHER): Payer: Self-pay

## 2013-12-10 ENCOUNTER — Ambulatory Visit (HOSPITAL_BASED_OUTPATIENT_CLINIC_OR_DEPARTMENT_OTHER)
Admission: RE | Admit: 2013-12-10 | Discharge: 2013-12-10 | Disposition: A | Discharge status: Home / Self Care | Payer: No Typology Code available for payment source | Source: Ambulatory Visit | Attending: Gastroenterology | Admitting: Gastroenterology

## 2013-12-10 DIAGNOSIS — R85618 Other abnormal cytological findings on specimens from anus: Secondary | ICD-10-CM

## 2013-12-10 DIAGNOSIS — K838 Other specified diseases of biliary tract: Secondary | ICD-10-CM | POA: Insufficient documentation

## 2013-12-10 DIAGNOSIS — R6889 Other general symptoms and signs: Secondary | ICD-10-CM

## 2013-12-10 DIAGNOSIS — R7989 Other specified abnormal findings of blood chemistry: Secondary | ICD-10-CM | POA: Insufficient documentation

## 2013-12-10 DIAGNOSIS — K7689 Other specified diseases of liver: Secondary | ICD-10-CM | POA: Insufficient documentation

## 2013-12-10 LAB — HEPATITIS B SURFACE ANTIBODY: HEPATITIS B SURFACE ANTIBODY: <8

## 2013-12-10 LAB — HEPATITIS A ANTIBODY, IGM: Hep A IgM: NONREACTIVE

## 2013-12-10 LAB — BILIRUBIN, TOTAL AND DIRECT
Bilirubin Direct: 0.6 mg/dL — ABNORMAL HIGH (ref 0.0–0.5)
Bilirubin Indirect: 1 mg/dL (ref 0.0–1.0)
Bilirubin, Total: 1.6 mg/dL — ABNORMAL HIGH (ref 0.1–1.2)

## 2013-12-10 LAB — HEMOLYSIS INDEX: Hemolysis Index: 6 (ref 0–18)

## 2013-12-10 LAB — HEPATITIS A ANTIBODY, TOTAL: Hepatitis A Total Antibody: NONREACTIVE

## 2013-12-10 LAB — HEPATITIS C ANTIBODY: Hepatitis C, AB: NONREACTIVE

## 2013-12-10 LAB — HEPATITIS B SURFACE ANTIGEN W/ REFLEX TO CONFIRMATION: Hepatitis B Surface Antigen: NONREACTIVE

## 2013-12-10 NOTE — Progress Notes (Signed)
Diagnosis:  atrial fibrillation/flutter  INR range:  2.0-3.0  Primary cardiologist:  Denyse Dago, MD  Attention:  may leave message on machine  ______________________________    INR = 2.6, date: May 11 , day: Monday  Comment:   Prefers [facility]    New dose?  No    Sun ZOX Tue Wed Thu Fri Sat   7.5 10 10 10 10 10  7.5     Next INR:  4 week(s)    Spoke to:  patient  Verbalized understanding:  Yes  Bill 4th INR bill NOT submitted  _______________________________  Diagnosis: atrial fibrillation/flutter   INR range: 2.0-3.0   Primary cardiologist: Dr Franchot Erichsen   Attention: may leave message on machine   ______________________________   INR = 2.7, date: Arpil 2 , day Thursday   Comment: none   New dose? No   Sun 7.5, Mon 10, Tue 10, Wed 10, Thu 10, Fri 10, Sat 7.5   Next INR: 4 week(s)   Spoke to: spoke to Mr Paule and he states understanding   Verbalized understanding: yes

## 2013-12-11 ENCOUNTER — Encounter (INDEPENDENT_AMBULATORY_CARE_PROVIDER_SITE_OTHER): Payer: Self-pay | Admitting: Cardiovascular Disease

## 2013-12-12 ENCOUNTER — Other Ambulatory Visit: Payer: Self-pay | Admitting: Gastroenterology

## 2013-12-12 ENCOUNTER — Telehealth (INDEPENDENT_AMBULATORY_CARE_PROVIDER_SITE_OTHER): Payer: Self-pay

## 2013-12-12 DIAGNOSIS — R932 Abnormal findings on diagnostic imaging of liver and biliary tract: Secondary | ICD-10-CM

## 2013-12-12 LAB — HEPATITIS B CORE ANTIBODY, TOTAL: Hepatitis B Core Total AB: NONREACTIVE

## 2013-12-12 NOTE — Telephone Encounter (Signed)
Mr Leitz walked in expressing concern about anxiety he is having with potential diagnostic work up with GI. He is being evaluated for increase in liver studies.he has a vacation planned to [place] and he is concerned because he is anxious and having difficulty sleeping. Per DOD, Dr Ezzard Standing Xanax 1mg  #20 take one tablet twice a day prn no refills. Mr Eckerson states understanding.

## 2013-12-13 ENCOUNTER — Other Ambulatory Visit: Payer: Self-pay | Admitting: Gastroenterology

## 2013-12-13 ENCOUNTER — Ambulatory Visit
Admission: RE | Admit: 2013-12-13 | Discharge: 2013-12-13 | Disposition: A | Discharge status: Home / Self Care | Payer: No Typology Code available for payment source | Source: Ambulatory Visit | Attending: Gastroenterology | Admitting: Gastroenterology

## 2013-12-13 DIAGNOSIS — R932 Abnormal findings on diagnostic imaging of liver and biliary tract: Secondary | ICD-10-CM

## 2013-12-13 DIAGNOSIS — R7989 Other specified abnormal findings of blood chemistry: Secondary | ICD-10-CM

## 2013-12-13 DIAGNOSIS — K7689 Other specified diseases of liver: Secondary | ICD-10-CM | POA: Insufficient documentation

## 2013-12-13 MED ORDER — IOHEXOL 350 MG/ML IV SOLN
100.0000 mL | Freq: Once | INTRAVENOUS | Status: AC | PRN
Start: 2013-12-13 — End: 2013-12-13
  Administered 2013-12-13: 100 mL via INTRAVENOUS

## 2013-12-20 ENCOUNTER — Ambulatory Visit: Payer: No Typology Code available for payment source

## 2014-01-22 ENCOUNTER — Telehealth (INDEPENDENT_AMBULATORY_CARE_PROVIDER_SITE_OTHER): Payer: Self-pay

## 2014-01-22 DIAGNOSIS — R079 Chest pain, unspecified: Secondary | ICD-10-CM

## 2014-01-22 NOTE — Telephone Encounter (Signed)
lexiscan

## 2014-01-22 NOTE — Telephone Encounter (Signed)
Pt phoned office stating that he is not be able to do nuclear treadmill due to tendonitis to his right achilles. Pt request lexiscan. Please be advised.

## 2014-01-22 NOTE — Telephone Encounter (Signed)
lexiscan instead of stress is ok

## 2014-01-24 ENCOUNTER — Other Ambulatory Visit (INDEPENDENT_AMBULATORY_CARE_PROVIDER_SITE_OTHER): Payer: Self-pay | Admitting: Cardiology

## 2014-01-24 DIAGNOSIS — R079 Chest pain, unspecified: Secondary | ICD-10-CM

## 2014-01-27 ENCOUNTER — Other Ambulatory Visit
Admission: RE | Admit: 2014-01-27 | Discharge: 2014-01-27 | Disposition: A | Discharge status: Home / Self Care | Payer: No Typology Code available for payment source | Source: Ambulatory Visit | Attending: Cardiology | Admitting: Cardiology

## 2014-01-27 ENCOUNTER — Ambulatory Visit (HOSPITAL_BASED_OUTPATIENT_CLINIC_OR_DEPARTMENT_OTHER)
Admission: RE | Admit: 2014-01-27 | Discharge: 2014-01-27 | Disposition: A | Discharge status: Home / Self Care | Payer: No Typology Code available for payment source | Source: Ambulatory Visit | Attending: Cardiology | Admitting: Cardiology

## 2014-01-27 ENCOUNTER — Ambulatory Visit
Admission: RE | Admit: 2014-01-27 | Discharge: 2014-01-27 | Disposition: A | Discharge status: Home / Self Care | Payer: No Typology Code available for payment source | Source: Ambulatory Visit | Attending: Cardiology | Admitting: Cardiology

## 2014-01-27 ENCOUNTER — Encounter (INDEPENDENT_AMBULATORY_CARE_PROVIDER_SITE_OTHER): Payer: Self-pay | Admitting: Cardiology

## 2014-01-27 ENCOUNTER — Ambulatory Visit: Payer: No Typology Code available for payment source

## 2014-01-27 DIAGNOSIS — R079 Chest pain, unspecified: Secondary | ICD-10-CM | POA: Insufficient documentation

## 2014-01-27 DIAGNOSIS — I4891 Unspecified atrial fibrillation: Secondary | ICD-10-CM | POA: Insufficient documentation

## 2014-01-27 DIAGNOSIS — I517 Cardiomegaly: Secondary | ICD-10-CM | POA: Insufficient documentation

## 2014-01-27 LAB — PT/INR
PT INR: 3.4 — ABNORMAL HIGH (ref 0.9–1.1)
PT: 33.7 s — ABNORMAL HIGH (ref 12.6–15.0)

## 2014-01-27 MED ORDER — THALLOUS CHLORIDE TL 201 1 MCI/ML IV SOLN
3.0000 | Freq: Once | INTRAVENOUS | Status: AC | PRN
Start: 2014-01-27 — End: 2014-01-27
  Administered 2014-01-27: 3 via INTRAVENOUS

## 2014-01-27 MED ORDER — TECHNETIUM TC 99M TETROFOSMIN INJECTION
1.0000 | Freq: Once | Status: AC | PRN
Start: 2014-01-27 — End: 2014-01-27
  Administered 2014-01-27: 1 via INTRAVENOUS

## 2014-01-27 MED ORDER — REGADENOSON 0.4 MG/5ML IV SOLN
INTRAVENOUS | Status: AC
Start: 2014-01-27 — End: 2014-01-27
  Filled 2014-01-27: qty 5

## 2014-01-27 NOTE — Progress Notes (Signed)
I spoke to the patient during the stress lexiscan in hospital (pt was outpt). He is considering cardioversion and I recommended we discuss this at an office visit.

## 2014-01-28 ENCOUNTER — Other Ambulatory Visit (INDEPENDENT_AMBULATORY_CARE_PROVIDER_SITE_OTHER): Payer: Self-pay

## 2014-01-28 ENCOUNTER — Ambulatory Visit (INDEPENDENT_AMBULATORY_CARE_PROVIDER_SITE_OTHER): Payer: No Typology Code available for payment source

## 2014-01-28 DIAGNOSIS — I4891 Unspecified atrial fibrillation: Secondary | ICD-10-CM

## 2014-01-28 DIAGNOSIS — I4819 Other persistent atrial fibrillation: Secondary | ICD-10-CM

## 2014-01-28 MED ORDER — LOSARTAN POTASSIUM 50 MG PO TABS
50.0000 mg | ORAL_TABLET | Freq: Every day | ORAL | Status: DC
Start: 2014-01-28 — End: 2015-01-15

## 2014-01-28 MED ORDER — WARFARIN SODIUM 5 MG PO TABS
5.0000 mg | ORAL_TABLET | Freq: Every day | ORAL | Status: DC
Start: 2014-01-28 — End: 2014-04-17

## 2014-01-28 NOTE — Progress Notes (Signed)
Diagnosis:  atrial fibrillation/flutter  INR range:  2.0-3.0  Primary cardiologist: Denyse Dago, MD  Attention: may leave message on machine  ______________________________    INR = 3.4 , date: June 29 , day: Monday  Comment: Prefers [place] lab    New dose? Yes   Glynis Smiles  Tue  Wed  Thu  Fri  Sat    10 7.5 10  10 10   7.5 10      Next INR:  1 week(s)    Spoke to: patient  Verbalized understanding: Yes  Bill 4th INR bill  Submitted         INR = 2.6, date: May 11 , day: Monday  Comment: Prefers [place] lab    New dose? No  Sun  ION  Tue  Wed  Thu  Fri  Sat    7.5  10  10  10  10  10   7.5      Next INR:  4 week(s)    Spoke to: patient  Verbalized understanding: Yes  Bill 4th INR bill NOT submitted

## 2014-01-29 DIAGNOSIS — I2510 Atherosclerotic heart disease of native coronary artery without angina pectoris: Secondary | ICD-10-CM | POA: Insufficient documentation

## 2014-01-29 HISTORY — DX: Atherosclerotic heart disease of native coronary artery without angina pectoris: I25.10

## 2014-02-04 ENCOUNTER — Encounter (INDEPENDENT_AMBULATORY_CARE_PROVIDER_SITE_OTHER): Payer: Self-pay | Admitting: Cardiology

## 2014-02-04 ENCOUNTER — Ambulatory Visit (INDEPENDENT_AMBULATORY_CARE_PROVIDER_SITE_OTHER): Payer: No Typology Code available for payment source | Admitting: Cardiology

## 2014-02-04 ENCOUNTER — Other Ambulatory Visit (INDEPENDENT_AMBULATORY_CARE_PROVIDER_SITE_OTHER): Payer: Self-pay | Admitting: Cardiology

## 2014-02-04 VITALS — BP 130/78 | HR 71 | Resp 16 | Ht 73.0 in | Wt 194.0 lb

## 2014-02-04 DIAGNOSIS — R0789 Other chest pain: Secondary | ICD-10-CM

## 2014-02-04 DIAGNOSIS — I10 Essential (primary) hypertension: Secondary | ICD-10-CM

## 2014-02-04 DIAGNOSIS — I482 Chronic atrial fibrillation, unspecified: Secondary | ICD-10-CM

## 2014-02-04 DIAGNOSIS — I4891 Unspecified atrial fibrillation: Secondary | ICD-10-CM

## 2014-02-04 NOTE — Addendum Note (Signed)
Addended by: Royann Shivers on: 02/04/2014 08:42 AM     Modules accepted: Orders

## 2014-02-04 NOTE — Progress Notes (Signed)
[place] CARDIOLOGY PROGRESS NOTE    I had the pleasure of seeing Jonathan [name] today for cardiovascular follow up. He is a pleasant 63 y.o. male with a history of chest discomfort gradually worsening over time who presents for discussion. The patient has noted a cramp feeling mid chest that occurs with moderate to increased exercise such as swimming hard and sob with exercise. Pt has needed to back off on the amount of exercise he does. No lightheadedness.   Occasionally the patient wakes up at night to urinate and feels some sob.          MEDICATIONS:  Current outpatient prescriptions: losartan (COZAAR) 50 MG tablet, Take 1 tablet (50 mg total) by mouth daily., Disp: 90 tablet, Rfl: 3;  warfarin (COUMADIN) 10 MG tablet, Take 10 mg by mouth daily., Disp: , Rfl: ;  warfarin (COUMADIN) 5 MG tablet, Take 1 tablet (5 mg total) by mouth daily. Or as directed, Disp: 90 tablet, Rfl: 3       REVIEW OF SYSTEMS: All other systems reviewed and negative except as above.    PHYSICAL EXAMINATION  General Appearance: well-appearing and in no acute distress.   Vital Signs: BP 130/78 mmHg  Pulse 71  Resp 16  Ht 1.854 m (6\' 1" )  Wt 87.998 kg (194 lb)  BMI 25.60 kg/m2     Neck: Supple without jugular venous distention.  Normal carotid upstrokes without bruits.  Chest: Clear to auscultation bilaterally with good air movement and respiratory effort and no wheezes, rales, or rhonchi  Cardiovascular: Normal S1 and  S2 without murmurs, gallops or rub. PMI of normal size and nondisplaced.     Extremities: Warm without edema.     Past Medical History   Diagnosis Date   . Hypertension    . Sick sinus syndrome    . Atrial fibrillation 7/14 dx     holter7/14 rates 30-111     Family History   Problem Relation Age of Onset   . Hypertension Mother    . Heart disease Father    . Stroke Father    Father CABG at age 26  History     Social History   . Marital Status: Married     Spouse Name: N/A     Number of Children: N/A   . Years of Education:  N/A     Social History Main Topics   . Smoking status: Never Smoker    . Smokeless tobacco: None   . Alcohol Use: Yes      Comment: socially   . Drug Use: No   . Sexual Activity: None     Other Topics Concern   . None     Social History Narrative         IMPRESSION:  Abnormal cardiac nuclear scan  Risk factors for CAD  Chest discomfort with heavier exertion limits the level of exercise the patient would like to achieve  Atrial fibrillation with bradycardia diagnosis July 2014.       PLAN:   Cardiac cath and stop warfarin in advance. Bridging is not necessary.  Consider eventual cardioversion with tikosyn vs cardioversion first without antiarrhythmic.         Royann Shivers, MD   02/04/2014

## 2014-02-07 ENCOUNTER — Telehealth (INDEPENDENT_AMBULATORY_CARE_PROVIDER_SITE_OTHER): Payer: Self-pay

## 2014-02-07 NOTE — Telephone Encounter (Signed)
Lm to call back regarding outpatient procedure

## 2014-02-18 ENCOUNTER — Ambulatory Visit
Admission: RE | Admit: 2014-02-18 | Discharge: 2014-02-18 | Disposition: A | Discharge status: Home / Self Care | Payer: No Typology Code available for payment source | Source: Ambulatory Visit | Attending: Cardiology | Admitting: Cardiology

## 2014-02-18 DIAGNOSIS — R0789 Other chest pain: Secondary | ICD-10-CM | POA: Insufficient documentation

## 2014-02-18 LAB — COMPREHENSIVE METABOLIC PANEL
ALT: 33 U/L (ref 0–55)
AST (SGOT): 34 U/L (ref 5–34)
Albumin/Globulin Ratio: 1.3 (ref 0.9–2.2)
Albumin: 3.9 g/dL (ref 3.5–5.0)
Alkaline Phosphatase: 77 U/L (ref 38–106)
BUN: 12 mg/dL (ref 9.0–28.0)
Bilirubin, Total: 1 mg/dL (ref 0.1–1.2)
CO2: 25 mEq/L (ref 21–30)
Calcium: 9.4 mg/dL (ref 8.5–10.5)
Chloride: 99 mEq/L — ABNORMAL LOW (ref 100–111)
Creatinine: 0.9 mg/dL (ref 0.5–1.5)
Globulin: 2.9 g/dL (ref 2.0–3.7)
Glucose: 102 mg/dL — ABNORMAL HIGH (ref 70–100)
Potassium: 4.3 mEq/L (ref 3.5–5.3)
Protein, Total: 6.8 g/dL (ref 6.0–8.3)
Sodium: 134 mEq/L — ABNORMAL LOW (ref 135–146)

## 2014-02-18 LAB — CBC AND DIFFERENTIAL
Basophils Absolute Automated: 0.04 10*3/uL (ref 0.00–0.20)
Basophils Automated: 0 %
Eosinophils Absolute Automated: 0.02 10*3/uL (ref 0.00–0.70)
Eosinophils Automated: 0 %
Hematocrit: 37.1 % — ABNORMAL LOW (ref 42.0–52.0)
Hgb: 13.2 g/dL (ref 13.0–17.0)
Immature Granulocytes Absolute: 0.02 10*3/uL
Immature Granulocytes: 0 %
Lymphocytes Absolute Automated: 1.62 10*3/uL (ref 0.50–4.40)
Lymphocytes Automated: 21 %
MCH: 31.1 pg (ref 28.0–32.0)
MCHC: 35.6 g/dL (ref 32.0–36.0)
MCV: 87.3 fL (ref 80.0–100.0)
MPV: 10.4 fL (ref 9.4–12.3)
Monocytes Absolute Automated: 0.63 10*3/uL (ref 0.00–1.20)
Monocytes: 8 %
Neutrophils Absolute: 5.26 10*3/uL (ref 1.80–8.10)
Neutrophils: 70 %
Nucleated RBC: 0 /100 WBC (ref 0–1)
Platelets: 212 10*3/uL (ref 140–400)
RBC: 4.25 10*6/uL — ABNORMAL LOW (ref 4.70–6.00)
RDW: 14 % (ref 12–15)
WBC: 7.57 10*3/uL (ref 3.50–10.80)

## 2014-02-18 LAB — HEMOLYSIS INDEX: Hemolysis Index: 2 (ref 0–18)

## 2014-02-18 LAB — GFR: EGFR: >60

## 2014-02-20 ENCOUNTER — Telehealth (INDEPENDENT_AMBULATORY_CARE_PROVIDER_SITE_OTHER): Payer: Self-pay

## 2014-02-20 NOTE — Telephone Encounter (Signed)
-----   Message from Royann Shivers, MD sent at 02/19/2014  8:54 PM EDT -----  Lab is ok please tell pt

## 2014-02-20 NOTE — Telephone Encounter (Signed)
Message left for pt.     Jonathan [name] R.

## 2014-02-21 ENCOUNTER — Other Ambulatory Visit
Admission: RE | Admit: 2014-02-21 | Discharge: 2014-02-21 | Disposition: A | Discharge status: Home / Self Care | Payer: No Typology Code available for payment source | Source: Ambulatory Visit | Attending: Cardiology | Admitting: Cardiology

## 2014-02-21 DIAGNOSIS — I4891 Unspecified atrial fibrillation: Secondary | ICD-10-CM | POA: Insufficient documentation

## 2014-02-21 LAB — PT/INR
PT INR: 2 — ABNORMAL HIGH (ref 0.9–1.1)
PT: 22.4 s — ABNORMAL HIGH (ref 12.6–15.0)

## 2014-02-24 ENCOUNTER — Ambulatory Visit (INDEPENDENT_AMBULATORY_CARE_PROVIDER_SITE_OTHER): Payer: Self-pay

## 2014-02-24 NOTE — Progress Notes (Signed)
Diagnosis:  atrial fibrillation/flutter  INR range:  2.0-3.0  Primary cardiologist: Denyse Dago, MD  Attention: may leave message on machine  ______________________________    INR = 2.0, date: July 24, day: Friday  Comment: Prefers [place] lab    New dose? No.   Jonathan [name]   Mon  Tue  Wed  Thu  Fri  Sat    10  7.5  10   10  10    7.5  10       Next INR:  1 week(s)    Spoke to: Left message.  Verbalized understanding: No.   INR 1/4      INR = 3.4 , date: June 29 , day: Monday  Comment: Prefers [place] lab    New dose? Yes   Jonathan [name]  Tue  Wed  Thu  Fri  Sat    10  7.5  10   10  10    7.5  10       Next INR:  1 week(s)    Spoke to: patient  Verbalized understanding: Yes  Bill 4th INR bill Submitted

## 2014-03-11 ENCOUNTER — Ambulatory Visit
Admission: RE | Admit: 2014-03-11 | Discharge: 2014-03-11 | Disposition: A | Discharge status: Home / Self Care | Payer: No Typology Code available for payment source | Source: Ambulatory Visit | Attending: Pulmonary Disease | Admitting: Pulmonary Disease

## 2014-03-11 ENCOUNTER — Other Ambulatory Visit: Payer: Self-pay | Admitting: Pulmonary Disease

## 2014-03-11 DIAGNOSIS — R918 Other nonspecific abnormal finding of lung field: Secondary | ICD-10-CM

## 2014-03-11 DIAGNOSIS — J984 Other disorders of lung: Secondary | ICD-10-CM

## 2014-03-12 ENCOUNTER — Ambulatory Visit: Payer: No Typology Code available for payment source

## 2014-03-24 ENCOUNTER — Other Ambulatory Visit (INDEPENDENT_AMBULATORY_CARE_PROVIDER_SITE_OTHER): Payer: Self-pay | Admitting: Cardiology

## 2014-03-24 DIAGNOSIS — I2510 Atherosclerotic heart disease of native coronary artery without angina pectoris: Secondary | ICD-10-CM

## 2014-03-24 DIAGNOSIS — I2583 Coronary atherosclerosis due to lipid rich plaque: Secondary | ICD-10-CM

## 2014-03-25 ENCOUNTER — Ambulatory Visit
Admission: RE | Admit: 2014-03-25 | Discharge: 2014-03-25 | Disposition: A | Discharge status: Home / Self Care | Payer: No Typology Code available for payment source | Source: Ambulatory Visit | Attending: Cardiology | Admitting: Cardiology

## 2014-03-25 DIAGNOSIS — I2510 Atherosclerotic heart disease of native coronary artery without angina pectoris: Secondary | ICD-10-CM | POA: Insufficient documentation

## 2014-03-25 DIAGNOSIS — I2583 Coronary atherosclerosis due to lipid rich plaque: Secondary | ICD-10-CM | POA: Insufficient documentation

## 2014-03-25 LAB — BASIC METABOLIC PANEL
BUN: 11 mg/dL (ref 9.0–28.0)
CO2: 24 mEq/L (ref 21–30)
Calcium: 9.4 mg/dL (ref 8.5–10.5)
Chloride: 97 mEq/L — ABNORMAL LOW (ref 100–111)
Creatinine: 0.8 mg/dL (ref 0.5–1.5)
Glucose: 97 mg/dL (ref 70–100)
Potassium: 4.4 mEq/L (ref 3.5–5.3)
Sodium: 130 mEq/L — ABNORMAL LOW (ref 135–146)

## 2014-03-25 LAB — CBC
Hematocrit: 38.4 % — ABNORMAL LOW (ref 42.0–52.0)
Hgb: 13 g/dL (ref 13.0–17.0)
MCH: 30.4 pg (ref 28.0–32.0)
MCHC: 33.9 g/dL (ref 32.0–36.0)
MCV: 89.7 fL (ref 80.0–100.0)
MPV: 10.5 fL (ref 9.4–12.3)
Nucleated RBC: 0 /100 WBC (ref 0–1)
Platelets: 219 10*3/uL (ref 140–400)
RBC: 4.28 10*6/uL — ABNORMAL LOW (ref 4.70–6.00)
RDW: 14 % (ref 12–15)
WBC: 7.07 10*3/uL (ref 3.50–10.80)

## 2014-03-25 LAB — GFR: EGFR: >60

## 2014-03-25 LAB — HEMOLYSIS INDEX: Hemolysis Index: 7 (ref 0–18)

## 2014-03-27 ENCOUNTER — Encounter (INDEPENDENT_AMBULATORY_CARE_PROVIDER_SITE_OTHER): Payer: Self-pay

## 2014-03-27 ENCOUNTER — Telehealth (INDEPENDENT_AMBULATORY_CARE_PROVIDER_SITE_OTHER): Payer: Self-pay

## 2014-03-27 NOTE — Telephone Encounter (Signed)
Approved Berkley Harvey #[id number] ex/ 05/11/14 per kim.S. Of [employer] for cath - [phone] with Besch 8/28 at alx

## 2014-03-28 ENCOUNTER — Encounter
Admission: RE | Disposition: A | Discharge status: Home / Self Care | Payer: Self-pay | Source: Ambulatory Visit | Attending: Cardiovascular Disease

## 2014-03-28 ENCOUNTER — Encounter: Admission: RE | Payer: Self-pay | Source: Ambulatory Visit

## 2014-03-28 ENCOUNTER — Ambulatory Visit: Payer: No Typology Code available for payment source | Admitting: Cardiovascular Disease

## 2014-03-28 ENCOUNTER — Ambulatory Visit
Admission: RE | Admit: 2014-03-28 | Discharge: 2014-03-28 | Disposition: A | Discharge status: Home / Self Care | Payer: No Typology Code available for payment source | Source: Ambulatory Visit | Attending: Cardiovascular Disease | Admitting: Cardiovascular Disease

## 2014-03-28 ENCOUNTER — Telehealth (INDEPENDENT_AMBULATORY_CARE_PROVIDER_SITE_OTHER): Payer: Self-pay

## 2014-03-28 ENCOUNTER — Ambulatory Visit
Admission: RE | Admit: 2014-03-28 | Payer: No Typology Code available for payment source | Source: Ambulatory Visit | Admitting: Cardiovascular Disease

## 2014-03-28 DIAGNOSIS — Z029 Encounter for administrative examinations, unspecified: Secondary | ICD-10-CM

## 2014-03-28 DIAGNOSIS — I4891 Unspecified atrial fibrillation: Secondary | ICD-10-CM | POA: Insufficient documentation

## 2014-03-28 DIAGNOSIS — I2510 Atherosclerotic heart disease of native coronary artery without angina pectoris: Secondary | ICD-10-CM | POA: Insufficient documentation

## 2014-03-28 SURGERY — LEFT HEART CATH POSS PCI
Anesthesia: Conscious Sedation | Laterality: Left

## 2014-03-28 SURGERY — LEFT HEART CATH POSS PCI
Laterality: Left

## 2014-03-28 MED ORDER — SODIUM CHLORIDE 0.9 % IV SOLN
INTRAVENOUS | Status: DC
Start: 2014-03-28 — End: 2014-03-28

## 2014-03-28 MED ORDER — SODIUM CHLORIDE 0.9 % IV SOLN
INTRAVENOUS | Status: AC
Start: 2014-03-28 — End: 2014-03-28

## 2014-03-28 MED ORDER — NITROGLYCERIN IN D5W 200-5 MCG/ML-% IV SOLN
INTRAVENOUS | Status: AC
Start: 2014-03-28 — End: 2014-03-28
  Filled 2014-03-28: qty 250

## 2014-03-28 MED ORDER — MIDAZOLAM HCL 2 MG/2ML IJ SOLN
INTRAMUSCULAR | Status: AC
Start: 2014-03-28 — End: 2014-03-28
  Administered 2014-03-28: 1 mg via INTRAVENOUS
  Filled 2014-03-28: qty 2

## 2014-03-28 MED ORDER — HEPARIN SODIUM (PORCINE) 1000 UNIT/ML IJ SOLN
INTRAMUSCULAR | Status: AC
Start: 2014-03-28 — End: 2014-03-28
  Administered 2014-03-28: 2000 [IU] via INTRAVENOUS
  Filled 2014-03-28: qty 10

## 2014-03-28 MED ORDER — VERAPAMIL HCL 2.5 MG/ML IV SOLN
INTRAVENOUS | Status: AC
Start: 2014-03-28 — End: 2014-03-28
  Administered 2014-03-28: 2.5 mg via INTRA_ARTERIAL
  Filled 2014-03-28: qty 2

## 2014-03-28 MED ORDER — HEPARIN WASH BOWL 5 UNITS/ML SOLN (CATH LAB)
Status: AC
Start: 2014-03-28 — End: 2014-03-28
  Filled 2014-03-28: qty 2000

## 2014-03-28 MED ORDER — ONDANSETRON HCL 4 MG/2ML IJ SOLN
4.0000 mg | Freq: Every day | INTRAMUSCULAR | Status: DC | PRN
Start: 2014-03-28 — End: 2014-03-28

## 2014-03-28 MED ORDER — SODIUM CHLORIDE 0.9 % IV BOLUS
250.0000 mL | Freq: Once | INTRAVENOUS | Status: DC | PRN
Start: 2014-03-28 — End: 2014-03-28

## 2014-03-28 MED ORDER — FENTANYL CITRATE 0.05 MG/ML IJ SOLN
INTRAMUSCULAR | Status: AC
Start: 2014-03-28 — End: 2014-03-28
  Administered 2014-03-28: 75 ug via INTRAVENOUS
  Filled 2014-03-28: qty 2

## 2014-03-28 MED ORDER — LIDOCAINE HCL (PF) 1 % IJ SOLN
INTRAMUSCULAR | Status: AC
Start: 2014-03-28 — End: 2014-03-28
  Filled 2014-03-28: qty 30

## 2014-03-28 MED ORDER — IODIXANOL 320 MG/ML IV SOLN
110.0000 mL | Freq: Once | INTRAVENOUS | Status: AC | PRN
Start: 2014-03-28 — End: 2014-03-28
  Administered 2014-03-28: 110 mL via INTRA_ARTERIAL

## 2014-03-28 MED ORDER — MIDAZOLAM HCL 2 MG/2ML IJ SOLN
INTRAMUSCULAR | Status: AC
Start: 2014-03-28 — End: 2014-03-28
  Administered 2014-03-28: 2 mg via INTRAVENOUS
  Filled 2014-03-28: qty 2

## 2014-03-28 NOTE — Progress Notes (Signed)
Received pt from cath lab s/p left heart cath with RRA access, Vascband intact, no pain, no hematoma, Aldrete 10, family at bedside.

## 2014-03-28 NOTE — Progress Notes (Signed)
The patient was taken  To the CCL and underwent LHC with coronary angio and LV angio via RRA approach with no immediate complications.    Findings: LM patent                  LAD 40-50% proximal lesion, 70% stenosis in dividing diagonal branch                  LCX minimal or no disease                  RCA large vessel minimal disease  LV angio EF 50% with normal wall motion.     Conclusion CAD with 40-50% proximal LAD and 70% diagonal lesion complex ostial location otherwise no significanr disease                     AF slow VR with occasional ventricular escape beats ? Chronotropic incompetence    Plan Consider adding statin Nitrate           Evaluate for chronotropic incompetence ? Cardioversion/PPM

## 2014-03-28 NOTE — H&P (Signed)
ASA        INDICATIONS:   The patient presnts with exertional chest pain and dyspnea with abnormal stress MPI showing mild inferior ischemia. He also has AF with controlled VR and has been on warfarin which has been held.     PMH  AF   REVIEW OF SYSTEMS:     YES  (x)         ALLERGIES:     No Known Allergies      LABS:     Lab Results   Component Value Date    WBC 7.07 03/25/2014    HGB 13.0 03/25/2014    HCT 38.4* 03/25/2014    MCV 89.7 03/25/2014    PLT 219 03/25/2014         Recent Labs  Lab 03/25/14  1335   SODIUM 130*   POTASSIUM 4.4   CHLORIDE 97*   CO2 24   BUN 11.0   CREATININE 0.8   EGFR >60.0   GLUCOSE 97   CALCIUM 9.4          ASA PHYSICAL STATUS     (  )  ASA 1   HEALTHY PATIENT  ( x )  ASA 2   MILD SYSTEMIC ILLNESS  (  )  ASA 3   SYSTEMIC DISEASE, NOT INCAPACITATING  (  )  ASA 4   SEVERE SYSTEMIC DISEASE, DISEASE IS CONSTANT THREAT TO                         LIFE  (  )  ASA 5   MORIBUND CONDITION, NOT EXPECTED TO LIVE >24 HOURS            IRRESPECTIVE OF PROCEDURE  (  )  E           EMERGENCY PROCEDURE       PLANNED SEDATION:     (  ) NO SEDATION  (x) MODERATE SEDATION  (  ) DEEP SEDATION WITH ANESTHESIA      CONCLUSION:     This patient has been seen and examined immediately prior to the procedure, and I feel that they are an appropriate candidate for the planned procedure with the planned sedation.    The risks, benefits, and alternatives to the planned procedure and sedation have been explained to the patient or the patient's guardian.     The currently available history & physical has been reviewed, and there are no major changes.     Exceptions only in the case of emergency.         Sheela Stack, MD

## 2014-03-28 NOTE — Discharge Instructions (Signed)
Interventional Cardiovascular Admission and Recovery  Catheterization Discharge Instructions  Arm Access          Access Site: Radial Artery    Activity:  1. Do not lift anything greater than five (5) pounds in weight and no strenuous activity for 48 hours.  2. No driving for 48 hours following your procedure.  3. Ask your doctor when you should retun to work  4. Drink 6-8 glasses of water for at least 48 hours to help flush your body of the dye used during the procedure.    Access Site Care:  1. You may shower 24 hours after your procedure.  Leave the bandage in place and  let the water passively flow over the site.  After 48 hours REMOVE the dressing before or during your shower.  Again, let the water passively flow over the site, wash gently with your hand, then pat the area dry.  Do not submerge access site in water (tub bath, pool, etc) until completely healed.   2. Do not rub, pick or scratch the area.   3. Do not apply creams, powders, lotions, or ointments to the site.   4. Apply a regular sized Band-Aid to the puncture site and change it daily for five (5) days.  You may shower daily.  5. Observe for signs of infection:  redness, warmth, swelling, drainage, or temperature greater than 100 degrees F.  If you suspect infection call the doctor who performed the procedure.    Normal Observation:  1. You may feel tenderness.  May take ACETAMINOPHEN (TYLENOL) if needed.    2. You may experience some mild bruising.      Call 911 if:  1. You are experiencing unrelieved chest pain.  2. You notice bleeding either through the dressing or underneath the skin.  If the blood is trapped under the skin, the area will hurt, become swollen and hard.  If either happens, lay down flat and hold pressure on the site.  This is an arterial bleed, and may become an emergency if unattended.    3. Your arm becomes cold, numb, painful, grayish in color, or change from usual color/sensation.

## 2014-03-28 NOTE — Progress Notes (Signed)
Pre- Cath Teaching and Learning Objectives   Learner: [name],   Preference for learning: Verbal  Teaching Method: Verbal Instruction   Outcome of Learning: Fully Achieved    Described/Demonstrated the following:     + Responsibilities of patient's care  + Cardiac Cath  + Purpose of procedure  + Need to be NPO pre-procedure  + Need for maintaining bedrest & straight leg post-procedure & sheath removal.  + Necessary fluid intake after procedure  + Symptoms of bleeding & states plan to notify nurse.

## 2014-04-01 HISTORY — PX: CARDIAC CATHETERIZATION: SHX172

## 2014-04-15 ENCOUNTER — Other Ambulatory Visit
Admission: RE | Admit: 2014-04-15 | Discharge: 2014-04-15 | Disposition: A | Discharge status: Home / Self Care | Payer: No Typology Code available for payment source | Source: Ambulatory Visit | Attending: Cardiology | Admitting: Cardiology

## 2014-04-15 DIAGNOSIS — I4891 Unspecified atrial fibrillation: Secondary | ICD-10-CM | POA: Insufficient documentation

## 2014-04-15 LAB — PT/INR
PT INR: 1.9 — ABNORMAL HIGH (ref 0.9–1.1)
PT: 21.7 s — ABNORMAL HIGH (ref 12.6–15.0)

## 2014-04-16 ENCOUNTER — Ambulatory Visit (INDEPENDENT_AMBULATORY_CARE_PROVIDER_SITE_OTHER): Payer: Self-pay

## 2014-04-16 NOTE — Progress Notes (Signed)
Diagnosis:  atrial fibrillation/flutter  INR range:  2.0-3.0  Primary cardiologist: Denyse Dago, MD  Attention: may leave message on machine  ______________________________    INR = 1.9, date: Sept 15, day: Tuesday  Comment: Prefers [place] lab    New dose? No.   Jonathan [name]   Mon  Tue  Wed  Thu  Fri  Sat    10  7.5  10   10  10    7.5  10       Next INR:  1 week(s)    Spoke to: Left message.  Verbalized understanding: No.   INR 2/4        INR = 2.0, date: July 24, day: Friday  Comment: Prefers [place] lab    New dose? No.   Jonathan [name]  Tue  Wed  Thu  Fri  Sat    10  7.5  10   10  10    7.5  10       Next INR:  1 week(s)    Spoke to: Left message.  Verbalized understanding: No.   INR 1/4

## 2014-04-17 ENCOUNTER — Ambulatory Visit (INDEPENDENT_AMBULATORY_CARE_PROVIDER_SITE_OTHER): Payer: No Typology Code available for payment source | Admitting: Cardiology

## 2014-04-17 ENCOUNTER — Encounter (INDEPENDENT_AMBULATORY_CARE_PROVIDER_SITE_OTHER): Payer: Self-pay | Admitting: Cardiology

## 2014-04-17 VITALS — BP 129/66 | HR 50 | Ht 73.0 in | Wt 192.0 lb

## 2014-04-17 DIAGNOSIS — R0789 Other chest pain: Secondary | ICD-10-CM

## 2014-04-17 DIAGNOSIS — I495 Sick sinus syndrome: Secondary | ICD-10-CM

## 2014-04-17 DIAGNOSIS — I10 Essential (primary) hypertension: Secondary | ICD-10-CM

## 2014-04-17 DIAGNOSIS — I4891 Unspecified atrial fibrillation: Secondary | ICD-10-CM

## 2014-04-17 DIAGNOSIS — I2510 Atherosclerotic heart disease of native coronary artery without angina pectoris: Secondary | ICD-10-CM | POA: Insufficient documentation

## 2014-04-17 DIAGNOSIS — I482 Chronic atrial fibrillation, unspecified: Secondary | ICD-10-CM

## 2014-04-17 NOTE — Progress Notes (Signed)
[place] CARDIOLOGY PROGRESS NOTE    I had the pleasure of seeing Jonathan [name] today for cardiovascular follow up. He is a pleasant 63 y.o. male with a history of cad by recent cath with a 50% mid lad and tight first diagonal with mild inferior ischemia on nuclear scan who presents for follow up. Patient has a sensation in the chest with exercise, it goes away if he slows or stops swimming.        MEDICATIONS:  Current outpatient prescriptions: losartan (COZAAR) 50 MG tablet, Take 1 tablet (50 mg total) by mouth daily., Disp: 90 tablet, Rfl: 3;  warfarin (COUMADIN) 10 MG tablet, Take 10 mg by mouth daily., Disp: , Rfl:        REVIEW OF SYSTEMS: All other systems reviewed and negative except as above.    PHYSICAL EXAMINATION  General Appearance: well-appearing and in no acute distress.   Vital Signs: BP 129/66 mmHg  Pulse 50  Ht 1.854 m (6\' 1" )  Wt 87.091 kg (192 lb)  BMI 25.34 kg/m2     Neck: Supple without jugular venous distention.  Normal carotid upstrokes without bruits.  Chest: Clear to auscultation bilaterally with good air movement and respiratory effort and no wheezes, rales, or rhonchi  Cardiovascular: Normal S1 and  S2 without murmurs, gallops or rub. PMI of normal size and nondisplaced.     Extremities: Warm without edema.   ECG: afib, rate 50 I have personally reviewed and interpreted the EKG/Rhythm.     Past Medical History   Diagnosis Date   . Hypertension    . Sick sinus syndrome    . Coronary artery disease 01/2014     mild inf ischemia   . Atrial fibrillation 7/14 dx     holter7/14 rates 30-111     Family History   Problem Relation Age of Onset   . Hypertension Mother    . Heart disease Father    . Stroke Father      History     Social History   . Marital Status: Married     Spouse Name: N/A     Number of Children: N/A   . Years of Education: N/A     Social History Main Topics   . Smoking status: Former Smoker     Quit date: 03/28/1984   . Smokeless tobacco: None   . Alcohol Use: Yes      Comment:  socially   . Drug Use: No   . Sexual Activity: None     Other Topics Concern   . None     Social History Narrative         IMPRESSION:  1. CAD , significant in D1 only with mild inferior ischemia (different region) on nuclear scan with chest discomfort.  2. Atrial fibrillation, ? How long this is present, pt has not wanted cardioversion in thepast but now is considering it. It will be difficult for this to be successful without additional meds.           PLAN:   Echocardiogram  Continue anticoag and when adequate for 1 month consider admit and tikosyn and cardioversion.  Sinus node dysfunction - Holter  Consider pacer due to CP with exertion and mild cad.      Royann Shivers, MD   04/17/2014

## 2014-04-18 ENCOUNTER — Encounter (INDEPENDENT_AMBULATORY_CARE_PROVIDER_SITE_OTHER): Payer: Self-pay | Admitting: Cardiology

## 2014-04-21 ENCOUNTER — Other Ambulatory Visit (HOSPITAL_BASED_OUTPATIENT_CLINIC_OR_DEPARTMENT_OTHER)
Admission: RE | Admit: 2014-04-21 | Discharge: 2014-04-21 | Disposition: A | Discharge status: Home / Self Care | Payer: No Typology Code available for payment source | Source: Ambulatory Visit | Attending: Cardiology | Admitting: Cardiology

## 2014-04-21 DIAGNOSIS — I4891 Unspecified atrial fibrillation: Secondary | ICD-10-CM

## 2014-04-21 LAB — PT/INR
PT INR: 3.1 — ABNORMAL HIGH (ref 0.9–1.1)
PT: 31.5 s — ABNORMAL HIGH (ref 12.6–15.0)

## 2014-04-25 ENCOUNTER — Ambulatory Visit (INDEPENDENT_AMBULATORY_CARE_PROVIDER_SITE_OTHER): Payer: No Typology Code available for payment source | Admitting: Cardiovascular Disease

## 2014-04-25 ENCOUNTER — Ambulatory Visit (INDEPENDENT_AMBULATORY_CARE_PROVIDER_SITE_OTHER): Payer: Self-pay

## 2014-04-25 DIAGNOSIS — I4891 Unspecified atrial fibrillation: Secondary | ICD-10-CM

## 2014-04-25 DIAGNOSIS — I482 Chronic atrial fibrillation, unspecified: Secondary | ICD-10-CM

## 2014-04-25 NOTE — Procedures (Signed)
TRANSTHORACIC ECHOCARDIOGRAM REPORT    [facility] - [facility]  Tel [phone]      PATIENT:  Jonathan [name]         MRN: [id number].  Gender: male.  DOB: [birth year].  Age: 63 y.o.    Date of study:  04/25/2014    Ordering Physician:  Denyse Dago, MD  Primary Physician:  Alease Medina, MD  Primary Cardiologist:  Leamon Arnt, MD    INDICATION:    1. Chronic atrial fibrillation         PROCEDURE: Transthoracic echocardiography was performed using standard  2-dimensional views, M-mode, and color and spectral Doppler.  Sonographer is Nutritional therapist.  This study meets appropriate use criteria.      Vitals: BP 129/66 mmHg, height 6 ft 1 in, weight 192 lbs.  Quality of the study is Good    DIMENSIONS:    3.9 cm -- Aortic root   4.9 cm -- Left atrium   5.7 cm -- Left ventricular diastolic diameter   3.8 cm -- Left ventricular systolic diameter   4.7 cm -- Right ventricular diameter  1.0 cm -- Interventricular septum  1.0 cm -- Posterior wall     60-65 % -- Left ventricular ejection fraction VELOCITIES and PRESSURES:    1.0 m/sec -- Mitral valve   1.4 m/sec -- Aortic valve   1.0 m/sec -- Pulmonic valve   0.5 m/sec -- Tricuspid valve   1.3 m/sec -- LV outflow tract     2.4 m/sec -- Tricuspid regurgitation velocity  10 mmHg -- Estimated RA pressure  32 mmHg -- RV systolic pressure     FINDINGS:    Left ventricle:  Normal size. Normal wall thickness. Systolic function is normal. Ejection fraction is estimated in the range of 60 - 65%. There are no regional wall motion abnormalities. Normal diastolic function.     Right ventricle:  Mildly dilated., Normal systolic functoin.     Left atrium:  Mildly dilated.     Right atrium:  Mildly dilated.     Mitral valve:  Normal valve morphology. No stenosis. Mild regurgitation.     Aortic valve:  Trileaflet. No stenosis. No regurgitation.     Tricuspid valve:  Normal valve morphology. No stenosis. Trace (physiologic) regurgitation.     Pulmonic valve:  No stenosis. Mild  regurgitation.     Pulmonary artery:  Pulmonary artery systolic pressure is normal.     Aorta:  The aortic root is mildly dilated.     Pericardium/ Pleura:  No pericardial effusion is seen.       IMPRESSION;    Normal left ventricular size and function.  Ejection fraction 60-65%  Biatrial dilatation  No significant valvular disease  Mildly dilated ascending aorta      Interpreted and electronically signed by:  Leamon Arnt, MD  [facility], [facility] [place] - Norberto Sorenson - Faythe Dingwall

## 2014-04-25 NOTE — Progress Notes (Signed)
Diagnosis:  atrial fibrillation/flutter  INR range:  2.0-3.0  Primary cardiologist: Denyse Dago, MD  Attention: may leave message on machine  ______________________________    INR = 3.2, date: Sept 22, day: Tuesday  Comment: Prefers [place] lab    New dose? No.   Sun   Mon  Tue  Wed  Thu  Fri  Sat    10  7.5  10   7.5 10   7.5  Hold       Next INR:  1 week(s)    Spoke to: Left message.  Verbalized understanding: No.   INR  3/4 not Billed     Diagnosis:  atrial fibrillation/flutter  INR range:  2.0-3.0  Primary cardiologist: Denyse Dago, MD  Attention: may leave message on machine  ______________________________    INR = 1.9, date: Sept 15, day: Tuesday  Comment: Prefers  lab    New dose? No.   Jonathan [name]  Tue  Wed  Thu  Fri  Sat    10  7.5  10   10  10    7.5  10       Next INR:  1 week(s)    Spoke to: Left message.  Verbalized understanding: No.   INR 2/4

## 2014-04-28 ENCOUNTER — Other Ambulatory Visit (HOSPITAL_BASED_OUTPATIENT_CLINIC_OR_DEPARTMENT_OTHER)
Admission: RE | Admit: 2014-04-28 | Discharge: 2014-04-28 | Disposition: A | Discharge status: Home / Self Care | Payer: No Typology Code available for payment source | Source: Ambulatory Visit | Attending: Cardiology | Admitting: Cardiology

## 2014-04-28 DIAGNOSIS — I4891 Unspecified atrial fibrillation: Secondary | ICD-10-CM

## 2014-04-28 LAB — PT/INR
PT INR: 1.9 — ABNORMAL HIGH (ref 0.9–1.1)
PT: 21.2 s — ABNORMAL HIGH (ref 12.6–15.0)

## 2014-04-30 ENCOUNTER — Ambulatory Visit (INDEPENDENT_AMBULATORY_CARE_PROVIDER_SITE_OTHER): Payer: Self-pay

## 2014-04-30 NOTE — Progress Notes (Signed)
Diagnosis:  atrial fibrillation/flutter  INR range:  2.0-3.0  Primary cardiologist: Denyse Dago, MD  Attention: may leave message on machine      (Pt is awaiting cardioversion when INR is 2-3 - x3 consecutive Readings readings Janelle to schedule cardioversion    Pls copy Janelle on all Results  -  On Weekly INR checks   ______________________________    INR = 1.9, date: Sept 28, day: Tuesday  Comment: Prefers [place] lab    New dose? Yes .   Wynelle Link   ZOX  Tue  Wed  Thu  Fri  Sat    10  5 10   10 10   5  10       Next INR: 1 week(s)    Spoke to: Left message.  Verbalized understanding: No.   INR 3/4 not Billed   .................................................................Marland Kitchen  Diagnosis:  atrial fibrillation/flutter  INR range:  2.0-3.0  Primary cardiologist: Denyse Dago, MD  Attention: may leave message on machine  ______________________________    INR = 3.2, date: Sept 22, day: Tuesday  Comment: Prefers [place] lab    New dose? No.   Sun   Mon  Tue  Wed  Thu  Fri  Sat    10  7.5  10   7.5 10   7.5  Hold       Next INR:  1 week(s)    Spoke to: Left message.  Verbalized understanding: No.   INR 3/4 not Billed

## 2014-05-02 ENCOUNTER — Other Ambulatory Visit
Admission: RE | Admit: 2014-05-02 | Discharge: 2014-05-02 | Disposition: A | Discharge status: Home / Self Care | Payer: No Typology Code available for payment source | Source: Ambulatory Visit | Attending: Cardiology | Admitting: Cardiology

## 2014-05-02 DIAGNOSIS — I4891 Unspecified atrial fibrillation: Secondary | ICD-10-CM | POA: Insufficient documentation

## 2014-05-02 LAB — PT/INR
PT INR: 2.1 — ABNORMAL HIGH (ref 0.9–1.1)
PT: 23.7 s — ABNORMAL HIGH (ref 12.6–15.0)

## 2014-05-05 ENCOUNTER — Ambulatory Visit (INDEPENDENT_AMBULATORY_CARE_PROVIDER_SITE_OTHER): Payer: Self-pay

## 2014-05-05 NOTE — Progress Notes (Incomplete)
Expand All Collapse All   Diagnosis:  atrial fibrillation/flutter  INR range:  2.0-3.0  Primary cardiologist: Denyse Dago, MD  Attention: may leave message on machine     (Pt is awaiting cardioversion when INR is 2-3 - x3 consecutive Readings readings Jonathan [name] to schedule cardioversion    Pls copy Jonathan [name] on all Results - On Weekly INR checks   INR =2.1, date: Oct 2 , day: Friday  Laboratory:  Jonathan [name]    Comment:  No comment    New dose?  No    Jonathan [name] Tue Wed Thu Fri Sat   {MNINRDOSE:31077} {MNINRDOSE:31077} {MNINRDOSE:31077} {MNINRDOSE:31077} {MNINRDOSE:31077} {MNINRDOSE:31077} {MNINRDOSE:31077}     Next INR:  *** {MNNEXTINR:31076}    Reviewed INR instructions with:  {MNINRSPOKETO:31078}    Billing: {MN1TO2OF 4:31997}  _______________________________    ____________________________    INR = 1.9, date: Sept 28, day: Tuesday  Comment: Prefers [place] lab    New dose? Yes .   Jonathan [name]    BJY   Tue   Wed   Thu   Fri   Sat     10   5  10    10  10    5   10        Next INR: 1 week(s)    Spoke to: Left message.  Verbalized understanding: No.   INR 3/4 not Billed

## 2014-05-05 NOTE — Progress Notes (Signed)
Diagnosis:  atrial fibrillation/flutter  INR range:  2.0-3.0  Primary cardiologist: Denyse Dago, MD  Attention: may leave message on machine     (Pt is awaiting cardioversion when INR is 2-3 - x3 consecutive Readings readings Janelle to schedule cardioversion    Pls copy Janelle on all Results - On Weekly INR checks   _INR =2.1, date: Oct 2 , day: Friday  Laboratory:  [place]    Comment:  No comment    New dose?  No    Sun Mon Tue Wed Thu Fri Sat   10 5.0 10 10 10  5.0 10     Next INR:  1 week(s)    Reviewed INR instructions with: n/a within range    Billing: 4th of 4 home INR not billed   _______________________________    _____________________________    INR = 1.9, date: Sept 28, day: Tuesday  Comment: Prefers [place] lab    New dose? Yes .   Wynelle Link    NUU   Tue   Wed   Thu   Fri   Sat     10   5  10    10  10    5   10        Next INR: 1 week(s)    Spoke to: Left message.  Verbalized understanding: No.   INR 3/4 not Billed

## 2014-05-09 ENCOUNTER — Other Ambulatory Visit (HOSPITAL_BASED_OUTPATIENT_CLINIC_OR_DEPARTMENT_OTHER)
Admission: RE | Admit: 2014-05-09 | Discharge: 2014-05-09 | Disposition: A | Discharge status: Home / Self Care | Payer: No Typology Code available for payment source | Source: Ambulatory Visit

## 2014-05-09 DIAGNOSIS — I4891 Unspecified atrial fibrillation: Secondary | ICD-10-CM

## 2014-05-09 LAB — PT/INR
PT INR: 3.4 — ABNORMAL HIGH (ref 0.9–1.1)
PT: 33.3 s — ABNORMAL HIGH (ref 12.6–15.0)

## 2014-05-16 ENCOUNTER — Other Ambulatory Visit (HOSPITAL_BASED_OUTPATIENT_CLINIC_OR_DEPARTMENT_OTHER)
Admission: RE | Admit: 2014-05-16 | Discharge: 2014-05-16 | Disposition: A | Discharge status: Home / Self Care | Payer: No Typology Code available for payment source | Source: Ambulatory Visit

## 2014-05-16 DIAGNOSIS — I4891 Unspecified atrial fibrillation: Secondary | ICD-10-CM

## 2014-05-16 LAB — PT/INR
PT INR: 2.8 — ABNORMAL HIGH (ref 0.9–1.1)
PT: 29.2 s — ABNORMAL HIGH (ref 12.6–15.0)

## 2014-05-23 ENCOUNTER — Other Ambulatory Visit (HOSPITAL_BASED_OUTPATIENT_CLINIC_OR_DEPARTMENT_OTHER)
Admission: RE | Admit: 2014-05-23 | Discharge: 2014-05-23 | Disposition: A | Discharge status: Home / Self Care | Payer: No Typology Code available for payment source | Source: Ambulatory Visit

## 2014-05-23 ENCOUNTER — Encounter (INDEPENDENT_AMBULATORY_CARE_PROVIDER_SITE_OTHER): Payer: Self-pay

## 2014-05-23 ENCOUNTER — Ambulatory Visit (INDEPENDENT_AMBULATORY_CARE_PROVIDER_SITE_OTHER): Payer: No Typology Code available for payment source | Admitting: Cardiovascular Disease

## 2014-05-23 DIAGNOSIS — I4891 Unspecified atrial fibrillation: Secondary | ICD-10-CM

## 2014-05-23 LAB — PT/INR
PT INR: 3.2 — ABNORMAL HIGH (ref 0.9–1.1)
PT: 32 s — ABNORMAL HIGH (ref 12.6–15.0)

## 2014-05-23 NOTE — Procedures (Signed)
HOLTER REPORT    [facility] - [facility]  Tel [phone]      PATIENT:  Jonathan [name]         MRN: [id number].  Gender: male.  DOB: 1950/10/23.  Age: 63 y.o.  Date of study:  05/02/2014    Ordering Physician:  Denyse Dago, MD  Primary Physician:  Alease Medina, MD  Primary Cardiologist:  Denyse Dago, MD    INDICATION:    1. Atrial fibrillation, unspecified          DATA:  Test of hookup:  05/02/2014  Recording time:  24 hours  Quality:  good   Tech comments:      Heart Rate Data     Total beats: 61111   Min HR: 27   Avg HR: 41   Max HR:146      Pauses > 2.5 sec: 0        Longest: 0 sec  Ventricular Ectopy     Total VE beats: 18 (<0.1%)   Vent runs: 0 events          Longest: 0 beats          Fastest: 0 bpm   Triplets: 0 events   Couplets: 0 events   Supraventricular Ectopy     Total SVE beats: 0 (0%)   Atrial runs: 0 events          Longest: 0 beats          Fastest: 0 bpm   Atrial pairs: 0 events     FINDINGS:  Baseline rhythm:  Atrial Fibrillation    Arrhythmia:  Occasional PVCs, slow ventricular response    Symptoms reported:  chest pain, SOB  Symptom correlation with arrhythmia:  none    IMPRESSION:   Controlled afib with episodes of bradycardia and rare tachycardia with exercise      Interpreted and electronically signed by:  Dr. Willaim Bane, MD   [facility], [facility] [place] - Norberto Sorenson - Faythe Dingwall

## 2014-05-25 ENCOUNTER — Other Ambulatory Visit: Payer: No Typology Code available for payment source

## 2014-05-26 ENCOUNTER — Ambulatory Visit (INDEPENDENT_AMBULATORY_CARE_PROVIDER_SITE_OTHER): Payer: Self-pay

## 2014-05-26 NOTE — Progress Notes (Signed)
Expand All Collapse All   Diagnosis:  atrial fibrillation/flutter  INR range:  2.0-3.0  Primary cardiologist: Denyse Dago, MD  Attention: may leave message on machine     (Pt is awaiting cardioversion when INR is 2-3 - x3 consecutive Readings readings Janelle to schedule cardioversion    Pls copy Janelle on all Results - On Weekly INR checks   _INR =2.1, date: Oct 2 , day: Friday  Laboratory: [place]    Comment:  No comment    New dose? No  Sun  Mon  Tue  Wed  Thu  Fri  Sat    10  5.0  10  10  10   5.0  10      Next INR:  1 week(s)    Reviewed INR instructions with: n/a within range    Billing: 4th of 4 home INR not billed

## 2014-05-26 NOTE — Progress Notes (Signed)
Diagnosis:  atrial fibrillation/flutter  INR range:  2.0-3.0  Primary cardiologist: Denyse Dago, MD  Attention: may leave message on machine  ________________________________________________________    INR = 3.2, date: Oct 23 , day: Friday  Laboratory:  [facility]    Comment:  No comment    New dose?  Yes, decreased dose (decrease tues to 5mg )    Sun Mon Tue Wed Thu Fri Sat   10 5.0 5.0 10 10 5.0 10     Next INR:  1 week(s)    Reviewed INR instructions with: L/M for pt    Billing: 1st of 4 home INR  _______________________________    (Pt is awaiting cardioversion when INR is 2-3 - x3 consecutive Readings readings Janelle to schedule cardioversion    Pls copy Janelle on all Results - On Weekly INR checks   _INR =2.1, date: Oct 2 , day: Friday  Laboratory:     Comment:  No comment    New dose? No  Sun   Mon  Tue  Wed  Thu  Fri  Sat    10   5.0   10   10   10    5.0   10       Next INR:  1 week(s)    Reviewed INR instructions with: n/a within range    Billing: 4th of 4 home INR not billed

## 2014-05-29 ENCOUNTER — Encounter (INDEPENDENT_AMBULATORY_CARE_PROVIDER_SITE_OTHER): Payer: Self-pay | Admitting: Cardiology

## 2014-05-29 ENCOUNTER — Ambulatory Visit (INDEPENDENT_AMBULATORY_CARE_PROVIDER_SITE_OTHER): Payer: No Typology Code available for payment source | Admitting: Cardiology

## 2014-05-29 VITALS — BP 114/66 | HR 54 | Ht 73.0 in | Wt 194.8 lb

## 2014-05-29 DIAGNOSIS — I481 Persistent atrial fibrillation: Secondary | ICD-10-CM

## 2014-05-29 DIAGNOSIS — I4819 Other persistent atrial fibrillation: Secondary | ICD-10-CM

## 2014-05-29 DIAGNOSIS — I10 Essential (primary) hypertension: Secondary | ICD-10-CM

## 2014-05-29 NOTE — Patient Instructions (Signed)
[facility] : [phone]

## 2014-05-29 NOTE — Progress Notes (Signed)
[facility] PROGRESS NOTE    I had the pleasure of seeing Jonathan [name] today for cardiovascular follow up. He is a pleasant 63 y.o. male with a history of cad by recent cath with a 50% mid lad and tight first diagonal with mild inferior ischemia on nuclear scan and chronic afib with rates 27-143 mean 41 by recent holter monitor asymptomatic who presents with occ chest discomfort lasting 1-2 seconds like a thump. No near syncope. Occ mild lightheadedness with standing up quickly.          MEDICATIONS:  Current outpatient prescriptions: losartan (COZAAR) 50 MG tablet, Take 1 tablet (50 mg total) by mouth daily., Disp: 90 tablet, Rfl: 3;  warfarin (COUMADIN) 10 MG tablet, Take 10 mg by mouth daily., Disp: , Rfl:        REVIEW OF SYSTEMS: All other systems reviewed and negative except as above.    PHYSICAL EXAMINATION  General Appearance: well-appearing and in no acute distress.   Vital Signs: BP 114/66 mmHg  Pulse 54  Ht 1.854 m (6\' 1" )  Wt 88.361 kg (194 lb 12.8 oz)  BMI 25.71 kg/m2         Past Medical History   Diagnosis Date   . Hypertension    . Sick sinus syndrome    . Coronary artery disease 01/2014     mild inf ischemia   . Bronchiectasis    . Atrial fibrillation 7/14 dx     holter7/14 rates 30-111, 05/2014 27-146, Avg 41  no pauses >2.5 sec   . Atrial fibrillation      Holter 10/15 rates in 30s mostly 7 pm to 7 am, > 100 w exercise only     Family History   Problem Relation Age of Onset   . Hypertension Mother    . Heart disease Father    . Stroke Father      History     Social History   . Marital Status: Married     Spouse Name: N/A     Number of Children: N/A   . Years of Education: N/A     Social History Main Topics   . Smoking status: Former Smoker     Quit date: 03/28/1984   . Smokeless tobacco: None   . Alcohol Use: Yes      Comment: socially   . Drug Use: No   . Sexual Activity: None     Other Topics Concern   . None     Social History Narrative         IMPRESSION:  1.   Persistent atrial  fibrillation  2. Bradycardia but rates are stable and no near syncope.   3, Pt is now 4 weeks with inr >2.0      PLAN:   Appt. With electrophysiologist to discuss afib ablation as a possiblity  Pt is not convinced he wants to go in hospital for 3 days for tikosyn rx and cardioversion.      Royann Shivers, MD   05/29/2014'

## 2014-05-30 ENCOUNTER — Ambulatory Visit (INDEPENDENT_AMBULATORY_CARE_PROVIDER_SITE_OTHER): Payer: No Typology Code available for payment source | Admitting: Cardiology

## 2014-05-30 ENCOUNTER — Other Ambulatory Visit (HOSPITAL_BASED_OUTPATIENT_CLINIC_OR_DEPARTMENT_OTHER)
Admission: RE | Admit: 2014-05-30 | Discharge: 2014-05-30 | Disposition: A | Discharge status: Home / Self Care | Payer: No Typology Code available for payment source | Source: Ambulatory Visit

## 2014-05-30 DIAGNOSIS — I4891 Unspecified atrial fibrillation: Secondary | ICD-10-CM

## 2014-05-30 LAB — PT/INR
PT INR: 2.3 — ABNORMAL HIGH (ref 0.9–1.1)
PT: 24.9 s — ABNORMAL HIGH (ref 12.6–15.0)

## 2014-06-02 ENCOUNTER — Ambulatory Visit (INDEPENDENT_AMBULATORY_CARE_PROVIDER_SITE_OTHER): Payer: Self-pay

## 2014-06-02 NOTE — Progress Notes (Signed)
Diagnosis:  atrial fibrillation/flutter  INR range:  2.0-3.0  Primary cardiologist: Denyse Dago, MD  Attention: may leave message on machine     (Pt is awaiting cardioversion when INR is 2-3 - x3 consecutive Readings readings Janelle to schedule cardioversion    Pls copy Janelle on all Results - On Weekly INR checks   _INR =2.3, date: Oct 30 , day: Friday  Laboratory: [facility]    Comment:  No comment    New dose? No  Sun  Mon  Tue  Wed  Thu  Fri  Sat    10  5.0  10  10  10   5.0  10      Next INR:  1 week(s)    Reviewed INR instructions with: n/a within range    Billing: 4th of 4 home INR not billed     Diagnosis:  atrial fibrillation/flutter  INR range:  2.0-3.0  Primary cardiologist: Denyse Dago, MD  Attention: may leave message on machine     (Pt is awaiting cardioversion when INR is 2-3 - x3 consecutive Readings readings Janelle to schedule cardioversion    Pls copy Janelle on all Results - On Weekly INR checks   _INR =2.1, date: Oct 2 , day: Friday  Laboratory: [place]    Comment:  No comment    New dose? No  Sun  Mon  Tue  Wed  Thu  Fri  Sat    10  5.0  10  10  10   5.0  10      Next INR:  1 week(s)    Reviewed INR instructions with: n/a within range    Billing: 4th of 4 home INR not billed

## 2014-06-06 ENCOUNTER — Other Ambulatory Visit
Admission: RE | Admit: 2014-06-06 | Discharge: 2014-06-06 | Disposition: A | Discharge status: Home / Self Care | Payer: No Typology Code available for payment source | Source: Ambulatory Visit | Attending: Cardiology | Admitting: Cardiology

## 2014-06-06 DIAGNOSIS — I4891 Unspecified atrial fibrillation: Secondary | ICD-10-CM | POA: Insufficient documentation

## 2014-06-06 LAB — PT/INR
PT INR: 3.2 — ABNORMAL HIGH (ref 0.9–1.1)
PT: 31.8 s — ABNORMAL HIGH (ref 12.6–15.0)

## 2014-06-12 ENCOUNTER — Ambulatory Visit (INDEPENDENT_AMBULATORY_CARE_PROVIDER_SITE_OTHER): Payer: No Typology Code available for payment source | Admitting: Clinical Cardiac Electrophysiology

## 2014-06-12 VITALS — BP 144/90 | HR 50 | Ht 73.0 in | Wt 193.0 lb

## 2014-06-12 DIAGNOSIS — I482 Chronic atrial fibrillation, unspecified: Secondary | ICD-10-CM

## 2014-06-12 DIAGNOSIS — Z9229 Personal history of other drug therapy: Secondary | ICD-10-CM

## 2014-06-12 DIAGNOSIS — I10 Essential (primary) hypertension: Secondary | ICD-10-CM

## 2014-06-12 DIAGNOSIS — Z7901 Long term (current) use of anticoagulants: Secondary | ICD-10-CM

## 2014-06-12 NOTE — Progress Notes (Signed)
[facility] NEW OFFICE CONSULTATION    I had the pleasure of seeing Jonathan [name] today for outpatient cardiac electrophysiology consultation. He presents for evaluation of atrial fibrillation.  He is a patient of Dr. Denyse Dago.     HPI:     [name] is a pleasant 63 year old gentleman who was found to have atrial  fibrillation on electrocardiogram when he presented for an insurance  physical in July 2014.  He had not seen a physician in many years.  He  thinks that the last time he had an evaluation before June of 2014 was when  he had an insurance physical in the mid 1990s.  He says that over the past  5 to 10 years, he has noticed that his pace when he exercises  has gotten  gradually slower.  He participated in a bicycle tour in 2009.  However, he  denies lightheadedness or dizziness, no syncope or near syncope.  He denies  palpitations.  However, he does admit that by the afternoon, he is tired,  and his wife says that he is tired all the time.     He was initially placed on Eliquis.  He then developed a chest cold.  He  was coughing up some blood.  He had a chest x-ray and chest CAT scan that  showed bronchiectasis.  There was a 1.2 cm nodule that had resolved by the  time of the next CT.  He has been on warfarin since June 2014.     It was initially planned that he would have a cardioversion after being on  anticoagulation.  However, he says that time went by and this was not  pursued.     He had a Holter monitor last year that showed atrial fibrillation with a  slow response.  On that Holter from last year, his average heart rate was  53.  However, during wakeful hours, he would have rates into the 30s, wide  complex escape rhythms of about 30, consistent with complete heart block in  atrial fibrillation.  He had another Holter monitor this year that showed  an average heart rate of 41.  His heart rate was less than 50, 80% of the  time.  His heart rates went into the 20s during wakeful hours, but I  do not  see wide complex escape.     His echocardiogram shows intact LV function.  His left atrial dimension is  5 cm.     Earlier this year, he also had some chest pain.  He came to the cardiac  catheterization.  No stenting was performed, but he had a 50% LAD lesion,  and a tighter lesion in the first diagonal.     [name] says that there was an episode 4 or 5 years ago when he kept  dropping his keys.  He tried to put them in his pocket, and could not.  He  kept dropping them.  He then noticed that the side of his face was numb,  and he was drooling.  The episode lasted about 5 minutes.  He never sought  medical attention for it.  He treated it himself by taking more aspirin.     He more recently has complained of clay-colored stools.  He has seen a  gastroenterologist.  He says that he has bloody sputum, consistent with the  bronchiectasis, on a daily basis.     He has a history of hypertension.  No history of diabetes  or  hypercholesterolemia.  He quit cigarette smoking 30 years ago.  He has  smoked 2 packs of cigarettes per day.     FAMILY HISTORY:  Notable for a father who had had bypass surgery, and paternal uncles with  coronary disease.     FAMILY HISTORY:  Otherwise noncontributory.     Remainder of review of systems noncontributory.    Assessment and Plan:    In summary, we have a 63 year old gentleman with chronic atrial  fibrillation.  It was discovered 1-1/2 years ago, and I suspect that he has  probably had it since the TIA that had 4 to 5 years ago.  In any case, it  has been documented for the last 1-1/2 years.  I think that the chance that  we are going to get him back to regular with any form of therapy at this  point is poor.  I discussed with him the AFFIRM study, indicating that the  goal of treatment is symptoms.  He is not aware of palpitations.  His  principal symptom is fatigue.     His Holter monitor last year showed wide-complex escape, and he has heart  rates into the 20s during  wakeful hours.  I think [name] needs a  pacemaker.     I discussed with him pharmacological and nonpharmacologic treatment of  atrial fibrillation, which I really do not think he is a candidate for.  I  talked to him about transvenous versus lead-less pacemaker, and he is very  interested in pursuing the latter, if his insurance will cover it.       I think [name] did indeed have a TIA several years ago, and this would  give him a CHADS VASc score of 5.  I discussed with him the CHADS VASc  score and his risk of an embolic event.  Will anticipate scheduling his  pacemaker.     Thank you very much for asking [place] to participate in his care.    PMH:   Past Medical History   Diagnosis Date   . Hypertension    . Sick sinus syndrome    . Coronary artery disease 01/2014     mild inf ischemia   . Bronchiectasis    . Atrial fibrillation 7/14 dx     holter7/14 rates 30-111, 05/2014 27-146, Avg 41  no pauses >2.5 sec   . Atrial fibrillation      Holter 10/15 rates in 30s mostly 7 pm to 7 am, > 100 w exercise only        MEDICATIONS: He has a current medication list which includes the following prescription(s): losartan - Take 1 tablet (50 mg total) by mouth daily and warfarin - Take 10 mg by mouth daily.    Current Outpatient Prescriptions   Medication Sig Dispense Refill   . losartan (COZAAR) 50 MG tablet Take 1 tablet (50 mg total) by mouth daily. 90 tablet 3   . warfarin (COUMADIN) 10 MG tablet Take 10 mg by mouth daily.       No current facility-administered medications for this visit.        SH:   History   Substance Use Topics   . Smoking status: Former Smoker     Quit date: 03/28/1984   . Smokeless tobacco: Not on file   . Alcohol Use: Yes      Comment: socially       FH: no family history of sudden death  REVIEW OF SYSTEMS: All other systems reviewed and negative except as above.    PHYSICAL EXAMINATION  General Appearance: A well-appearing male in no acute distress.   Vital Signs: Ht 1.854 m (6\' 1" )  Wt 87.544  kg (193 lb)  BMI 25.47 kg/m2   HEENT: Sclera anicteric, conjunctiva without pallor, moist mucous membranes, normal dentition.   Neck: Supple. Full range of motion.  Chest: Clear to auscultation. No wheezes, rales, or rhonchi  Cardiovascular: Normal S1 and S2 without murmurs, rub, or gallops.No edema. Pulses 2+ and equal. JVP <10 cm H2O  Abdomen: Soft, nontender. Nl bowel sounds  Extremities: Anicteric  Skin: Warm and dry  Neuro: Alert and oriented x3. Grossly intact. Strength is symmetric and moves all 4. Normal mood and affect.     ECG: AF, slow response of 50.     Cardiac Diagnostics:  As above.  RV pressure 32 mmHg.

## 2014-06-13 ENCOUNTER — Ambulatory Visit
Admission: RE | Admit: 2014-06-13 | Discharge: 2014-06-13 | Disposition: A | Discharge status: Home / Self Care | Payer: No Typology Code available for payment source | Source: Ambulatory Visit | Attending: Clinical Cardiac Electrophysiology | Admitting: Clinical Cardiac Electrophysiology

## 2014-06-13 DIAGNOSIS — Z01818 Encounter for other preprocedural examination: Secondary | ICD-10-CM | POA: Insufficient documentation

## 2014-06-13 DIAGNOSIS — I482 Chronic atrial fibrillation: Secondary | ICD-10-CM | POA: Insufficient documentation

## 2014-06-13 LAB — CBC AND DIFFERENTIAL
Basophils Absolute Automated: 0.06 10*3/uL (ref 0.00–0.20)
Basophils Automated: 1 %
Eosinophils Absolute Automated: 0.03 10*3/uL (ref 0.00–0.70)
Eosinophils Automated: 0 %
Hematocrit: 41.9 % — ABNORMAL LOW (ref 42.0–52.0)
Hgb: 14.1 g/dL (ref 13.0–17.0)
Immature Granulocytes Absolute: 0.02 10*3/uL
Immature Granulocytes: 0 %
Lymphocytes Absolute Automated: 1.55 10*3/uL (ref 0.50–4.40)
Lymphocytes Automated: 25 %
MCH: 30.7 pg (ref 28.0–32.0)
MCHC: 33.7 g/dL (ref 32.0–36.0)
MCV: 91.3 fL (ref 80.0–100.0)
MPV: 10.7 fL (ref 9.4–12.3)
Monocytes Absolute Automated: 0.61 10*3/uL (ref 0.00–1.20)
Monocytes: 10 %
Neutrophils Absolute: 3.93 10*3/uL (ref 1.80–8.10)
Neutrophils: 63 %
Nucleated RBC: 0 /100 WBC (ref 0–1)
Platelets: 251 10*3/uL (ref 140–400)
RBC: 4.59 10*6/uL — ABNORMAL LOW (ref 4.70–6.00)
RDW: 14 % (ref 12–15)
WBC: 6.2 10*3/uL (ref 3.50–10.80)

## 2014-06-13 LAB — PT/INR
PT INR: 3.1 — ABNORMAL HIGH (ref 0.9–1.1)
PT: 31.2 s — ABNORMAL HIGH (ref 12.6–15.0)

## 2014-06-13 LAB — BASIC METABOLIC PANEL
BUN: 18 mg/dL (ref 9.0–28.0)
CO2: 26 mEq/L (ref 21–30)
Calcium: 9.9 mg/dL (ref 8.5–10.5)
Chloride: 107 mEq/L (ref 100–111)
Creatinine: 0.9 mg/dL (ref 0.5–1.5)
Glucose: 114 mg/dL — ABNORMAL HIGH (ref 70–100)
Potassium: 4.7 mEq/L (ref 3.5–5.3)
Sodium: 141 mEq/L (ref 135–146)

## 2014-06-13 LAB — HEMOLYSIS INDEX: Hemolysis Index: 8 (ref 0–18)

## 2014-06-13 LAB — GFR: EGFR: >60

## 2014-06-16 ENCOUNTER — Telehealth: Payer: Self-pay

## 2014-06-16 ENCOUNTER — Encounter (INDEPENDENT_AMBULATORY_CARE_PROVIDER_SITE_OTHER): Payer: No Typology Code available for payment source | Admitting: Clinical Cardiac Electrophysiology

## 2014-06-16 ENCOUNTER — Institutional Professional Consult (permissible substitution) (INDEPENDENT_AMBULATORY_CARE_PROVIDER_SITE_OTHER): Payer: No Typology Code available for payment source | Admitting: Clinical Cardiac Electrophysiology

## 2014-06-16 NOTE — Telephone Encounter (Signed)
LM with patient to CV for OV to discuss.

## 2014-06-16 NOTE — Telephone Encounter (Signed)
You saw patient on 11/12.  Having leadless PM on 11/20 with Dr. Jannett Celestine

## 2014-06-16 NOTE — Telephone Encounter (Signed)
Study: LEADLESS II  Date: 06/16/2014  Time: 2:45PM    Mr. Deshone "Jonathan [name]" Melvyn Neth was referred to research by Dr. Albertine Grates for the LEADLESS II research study.    Mr. Latterell was contacted by research staff to go over the study protocol and what his participation would entail if enrolling in the study.  All questions were answered and concerns addressed.  The consent form was emailed to the patient for further review.    Research staff will contact the patient again tomorrow to discuss potential enrollment and answer any new questions that may arise.    Nikki Dom  Clinical Research Coordinator  Cardiology Research  [phone]

## 2014-06-17 ENCOUNTER — Ambulatory Visit: Payer: No Typology Code available for payment source

## 2014-06-17 NOTE — Pre-Procedure Instructions (Signed)
   Labs done- @[facility]    Medication per MD instruction   Discharge ride home/companion confirmed see checklist for phone #   No open skin / wounds    NPO and arrival time confirmed   Smoking status confirmed -former  CHG wipes

## 2014-06-18 ENCOUNTER — Telehealth: Payer: Self-pay

## 2014-06-18 NOTE — Telephone Encounter (Signed)
Study: LEADLESS II   Date: 06/18/2014  Time: 11:15AM    [name] was referred to research by Dr. Albertine Grates for the LEADLESS II research study.    He was contacted by research staff earlier this week and was provided with the informed consent form for more information.  He called today to notify staff that he is interested in participating in the study.  He is scheduled for Friday, 06/20/2014 for implant.    We will meet up with him prior to his procedure to sign and go over consent form.  All questions were answered and he is aware of all events.    Nikki Dom  Clinical Research Coordinator  Cardiology Research  [phone]

## 2014-06-19 ENCOUNTER — Encounter (INDEPENDENT_AMBULATORY_CARE_PROVIDER_SITE_OTHER): Payer: Self-pay | Admitting: Clinical Cardiac Electrophysiology

## 2014-06-20 ENCOUNTER — Ambulatory Visit: Payer: No Typology Code available for payment source

## 2014-06-20 ENCOUNTER — Ambulatory Visit: Payer: No Typology Code available for payment source | Admitting: Internal Medicine

## 2014-06-20 ENCOUNTER — Encounter
Admission: RE | Disposition: A | Discharge status: Home / Self Care | Payer: Self-pay | Source: Ambulatory Visit | Attending: Internal Medicine

## 2014-06-20 ENCOUNTER — Ambulatory Visit: Payer: No Typology Code available for payment source | Admitting: Anesthesiology

## 2014-06-20 ENCOUNTER — Ambulatory Visit
Admission: RE | Admit: 2014-06-20 | Discharge: 2014-06-21 | Disposition: A | Discharge status: Home / Self Care | Payer: No Typology Code available for payment source | Source: Ambulatory Visit | Attending: Internal Medicine | Admitting: Internal Medicine

## 2014-06-20 DIAGNOSIS — I482 Chronic atrial fibrillation: Secondary | ICD-10-CM | POA: Insufficient documentation

## 2014-06-20 DIAGNOSIS — I4891 Unspecified atrial fibrillation: Secondary | ICD-10-CM | POA: Insufficient documentation

## 2014-06-20 DIAGNOSIS — Z7901 Long term (current) use of anticoagulants: Secondary | ICD-10-CM | POA: Insufficient documentation

## 2014-06-20 DIAGNOSIS — Z006 Encounter for examination for normal comparison and control in clinical research program: Secondary | ICD-10-CM | POA: Insufficient documentation

## 2014-06-20 DIAGNOSIS — R001 Bradycardia, unspecified: Secondary | ICD-10-CM | POA: Insufficient documentation

## 2014-06-20 HISTORY — DX: Dyspnea, unspecified: R06.00

## 2014-06-20 HISTORY — DX: Other forms of dyspnea: R06.09

## 2014-06-20 HISTORY — DX: Hyperlipidemia, unspecified: E78.5

## 2014-06-20 LAB — PT/INR
PT INR: 1.8 — ABNORMAL HIGH (ref 0.9–1.1)
PT: 21.1 s — ABNORMAL HIGH (ref 12.6–15.0)

## 2014-06-20 SURGERY — PM IMPLANT DUAL
Anesthesia: Anesthesia MAC / Sedation

## 2014-06-20 MED ORDER — PROPOFOL 10 MG/ML IV EMUL
INTRAVENOUS | Status: AC
Start: 2014-06-20 — End: ?
  Filled 2014-06-20: qty 50

## 2014-06-20 MED ORDER — PROPOFOL INFUSION 10 MG/ML
INTRAVENOUS | Status: DC | PRN
Start: 2014-06-20 — End: 2014-06-20
  Administered 2014-06-20: 40 mg via INTRAVENOUS
  Administered 2014-06-20: 20 mg via INTRAVENOUS

## 2014-06-20 MED ORDER — CEFUROXIME SODIUM 1.5 G IJ/IV SOLR (WRAP)
Status: DC | PRN
Start: 2014-06-20 — End: 2014-06-20
  Administered 2014-06-20: 1.5 g via INTRAVENOUS

## 2014-06-20 MED ORDER — FAMOTIDINE 20 MG/2ML IV SOLN
INTRAVENOUS | Status: AC
Start: 2014-06-20 — End: ?
  Filled 2014-06-20: qty 2

## 2014-06-20 MED ORDER — MIDAZOLAM HCL 2 MG/2ML IJ SOLN
INTRAMUSCULAR | Status: DC | PRN
Start: 2014-06-20 — End: 2014-06-20
  Administered 2014-06-20: 2 mg via INTRAVENOUS

## 2014-06-20 MED ORDER — ZOLPIDEM TARTRATE 5 MG PO TABS
5.0000 mg | ORAL_TABLET | Freq: Every evening | ORAL | Status: DC | PRN
Start: 2014-06-20 — End: 2014-06-21
  Administered 2014-06-21: 5 mg via ORAL
  Filled 2014-06-20: qty 1

## 2014-06-20 MED ORDER — LOSARTAN POTASSIUM 25 MG PO TABS
50.0000 mg | ORAL_TABLET | Freq: Every day | ORAL | Status: DC
Start: 2014-06-20 — End: 2014-06-21
  Administered 2014-06-20: 50 mg via ORAL
  Filled 2014-06-20: qty 2

## 2014-06-20 MED ORDER — IODIXANOL 320 MG/ML IV SOLN
23.0000 mL | Freq: Once | INTRAVENOUS | Status: AC | PRN
Start: 2014-06-20 — End: 2014-06-20
  Administered 2014-06-20: 23 mL via INTRAVENOUS

## 2014-06-20 MED ORDER — LIDOCAINE HCL (PF) 1 % IJ SOLN
INTRAMUSCULAR | Status: AC
Start: 2014-06-20 — End: 2014-06-20
  Administered 2014-06-20: 12 mL
  Filled 2014-06-20: qty 30

## 2014-06-20 MED ORDER — HEPARIN (PORCINE) IN NACL 2-0.9 UNIT/ML-% IJ SOLN
INTRAMUSCULAR | Status: AC
Start: 2014-06-20 — End: 2014-06-20
  Filled 2014-06-20: qty 2000

## 2014-06-20 MED ORDER — ONDANSETRON HCL 4 MG/2ML IJ SOLN
INTRAMUSCULAR | Status: DC | PRN
Start: 2014-06-20 — End: 2014-06-20
  Administered 2014-06-20: 4 mg via INTRAVENOUS

## 2014-06-20 MED ORDER — WARFARIN SODIUM 5 MG PO TABS
5.0000 mg | ORAL_TABLET | Freq: Every day | ORAL | Status: DC
Start: 2014-06-20 — End: 2014-06-21
  Administered 2014-06-20: 5 mg via ORAL
  Filled 2014-06-20: qty 1

## 2014-06-20 MED ORDER — ONDANSETRON HCL 4 MG/2ML IJ SOLN
INTRAMUSCULAR | Status: AC
Start: 2014-06-20 — End: ?
  Filled 2014-06-20: qty 2

## 2014-06-20 MED ORDER — LACTATED RINGERS IV SOLN
INTRAVENOUS | Status: DC | PRN
Start: 2014-06-20 — End: 2014-06-20

## 2014-06-20 MED ORDER — MIDAZOLAM HCL 2 MG/2ML IJ SOLN
INTRAMUSCULAR | Status: AC
Start: 2014-06-20 — End: ?
  Filled 2014-06-20: qty 2

## 2014-06-20 MED ORDER — ACETAMINOPHEN-CODEINE #3 300-30 MG PO TABS
1.0000 | ORAL_TABLET | Freq: Four times a day (QID) | ORAL | Status: DC | PRN
Start: 2014-06-20 — End: 2014-06-21

## 2014-06-20 MED ORDER — ACETAMINOPHEN 325 MG PO TABS
325.0000 mg | ORAL_TABLET | Freq: Four times a day (QID) | ORAL | Status: DC | PRN
Start: 2014-06-20 — End: 2014-06-21

## 2014-06-20 MED ORDER — FAMOTIDINE 10 MG/ML IV SOLN (WRAP)
INTRAVENOUS | Status: DC | PRN
Start: 2014-06-20 — End: 2014-06-20
  Administered 2014-06-20: 20 mg via INTRAVENOUS

## 2014-06-20 MED ORDER — DEXAMETHASONE SODIUM PHOSPHATE 4 MG/ML IJ SOLN (WRAP)
INTRAMUSCULAR | Status: DC | PRN
Start: 2014-06-20 — End: 2014-06-20
  Administered 2014-06-20: 4 mg via INTRAVENOUS

## 2014-06-20 MED ORDER — SODIUM CHLORIDE 0.9 % IV MBP
1.5000 g | Freq: Three times a day (TID) | INTRAVENOUS | Status: DC
Start: 2014-06-20 — End: 2014-06-21
  Administered 2014-06-20 – 2014-06-21 (×2): 1.5 g via INTRAVENOUS
  Filled 2014-06-20 (×2): qty 1500

## 2014-06-20 MED ORDER — LACTATED RINGERS IV SOLN
INTRAVENOUS | Status: DC
Start: 2014-06-20 — End: 2014-06-20

## 2014-06-20 MED ORDER — CEFUROXIME SODIUM 1.5 G IJ/IV SOLR (WRAP)
Status: AC
Start: 2014-06-20 — End: ?
  Filled 2014-06-20: qty 1500

## 2014-06-20 MED ORDER — HEPARIN (PORCINE) IN NACL 2-0.9 UNIT/ML-% IJ SOLN
INTRAMUSCULAR | Status: AC
Start: 2014-06-20 — End: 2014-06-20
  Filled 2014-06-20: qty 1000

## 2014-06-20 MED ORDER — PROPOFOL INFUSION 10 MG/ML
INTRAVENOUS | Status: DC | PRN
Start: 2014-06-20 — End: 2014-06-20
  Administered 2014-06-20: 160 ug/kg/min via INTRAVENOUS

## 2014-06-20 MED ORDER — FLUTICASONE PROPIONATE 50 MCG/ACT NA SUSP
2.0000 | NASAL | Status: DC | PRN
Start: 2014-06-20 — End: 2014-06-21
  Filled 2014-06-20: qty 16

## 2014-06-20 MED ORDER — FENTANYL CITRATE 0.05 MG/ML IJ SOLN
INTRAMUSCULAR | Status: AC
Start: 2014-06-20 — End: ?
  Filled 2014-06-20: qty 2

## 2014-06-20 MED ORDER — SODIUM CHLORIDE 0.9 % IJ SOLN
INTRAMUSCULAR | Status: AC
Start: 2014-06-20 — End: ?
  Filled 2014-06-20: qty 20

## 2014-06-20 MED ORDER — FENTANYL CITRATE 0.05 MG/ML IJ SOLN
INTRAMUSCULAR | Status: DC | PRN
Start: 2014-06-20 — End: 2014-06-20
  Administered 2014-06-20 (×4): 25 ug via INTRAVENOUS

## 2014-06-20 MED ORDER — DEXAMETHASONE SODIUM PHOSPHATE 20 MG/5ML IJ SOLN
INTRAMUSCULAR | Status: AC
Start: 2014-06-20 — End: ?
  Filled 2014-06-20: qty 5

## 2014-06-20 NOTE — Anesthesia Preprocedure Evaluation (Addendum)
Anesthesia Evaluation    AIRWAY    Mallampati: II    TM distance: >3 FB  Neck ROM: full     CARDIOVASCULAR    regular       DENTAL         PULMONARY    clear to auscultation     OTHER FINDINGS      In summary, we have a 63 year old gentleman with chronic atrial  fibrillation. It was discovered 1-1/2 years ago, and I suspect that he has  probably had it since the TIA that had 4 to 5 years ago. In any case, it  has been documented for the last 1-1/2 years. I think that the chance that  we are going to get him back to regular with any form of therapy at this  point is poor. I discussed with him the AFFIRM study, indicating that the  goal of treatment is symptoms. He is not aware of palpitations. His  principal symptom is fatigue.    Nl LV function                Anesthesia Plan    ASA 3     MAC                                 informed consent obtained

## 2014-06-20 NOTE — Progress Notes (Signed)
Admitted patient from home for nano-pacemaker implant.  Pt. Verbalized understanding of procedure and recovery requirements.  Wife at bedside.

## 2014-06-20 NOTE — Transfer of Care (Signed)
Anesthesia Transfer of Care Note    Patient: Jonathan [name]    Procedures performed: Procedure(s) with comments:  PM Implant Dual - ST JUDE-NANOSTIM    Anesthesia type: General TIVA    Patient location:[facility]    Last vitals:   Filed Vitals:    06/20/14 1919   BP: 125/78   Pulse: 61   Temp: 36.4 C (97.5 F)   Resp: 20   SpO2: 98%       Post pain: Patient not complaining of pain, continue current therapy      Mental Status:awake and alert     Respiratory Function: tolerating room air    Cardiovascular: stable    Nausea/Vomiting: patient not complaining of nausea or vomiting    Hydration Status: adequate    Post assessment: no apparent anesthetic complications and no reportable events

## 2014-06-20 NOTE — Plan of Care (Addendum)
Received S/P PMI (Nanostim) accessed via RFV, CDI, no hematoma. C/O discomfort to right side testicles noted some bruising. Anesthesia aware. Elevated with towel. Verbalized discomfort to left upper back. Relieved with repositioning. Dr. Lujean Amel made aware. Explained plan of care to patient. Instructions given re: safety and bleeding precautions. Verbalized understanding. Resting comfortably at this time. Will continue to monitor.

## 2014-06-20 NOTE — Research Enrollment (Signed)
Study: LEADLESS II  Date: 06/20/2014  Time: 2:00PM    [name] was referred to research by Dr. Albertine Grates for the LEADLESS II research study.    He was contacted by research staff on 06/16/2014 and provided with the informed consent to review.    Today, 06/20/2014, the patient signed consent form and was enrolled in the LEADLESS II research study.  All questions were answered and concerns addressed.    No procedures or testing was performed prior to consent.    Nikki Dom  Clinical Research Coordinator  Cardiology Research  [phone]

## 2014-06-20 NOTE — Progress Notes (Signed)
sjm Leadless pm placed by dr wish, assisted by dr Kerrie Pleasure.    Bedrest  [place] in am after remove figure of 8 hemostasis suture.    Note in [facility].    Edward Qualia, MD  [facility]   Spectralink  x [phone]  Office: [phone]

## 2014-06-21 DIAGNOSIS — I481 Persistent atrial fibrillation: Secondary | ICD-10-CM

## 2014-06-21 DIAGNOSIS — I4891 Unspecified atrial fibrillation: Secondary | ICD-10-CM

## 2014-06-21 LAB — BASIC METABOLIC PANEL
BUN: 14 mg/dL (ref 9.0–28.0)
CO2: 22 mEq/L (ref 22–29)
Calcium: 9.3 mg/dL (ref 8.5–10.5)
Chloride: 104 mEq/L (ref 100–111)
Creatinine: 0.8 mg/dL (ref 0.7–1.3)
Glucose: 110 mg/dL — ABNORMAL HIGH (ref 70–100)
Potassium: 4.4 mEq/L (ref 3.5–5.1)
Sodium: 136 mEq/L (ref 136–145)

## 2014-06-21 LAB — GFR: EGFR: >60

## 2014-06-21 LAB — MAGNESIUM: Magnesium: 1.9 mg/dL (ref 1.6–2.6)

## 2014-06-21 MED ORDER — ZOLPIDEM TARTRATE 5 MG PO TABS
5.0000 mg | ORAL_TABLET | Freq: Every evening | ORAL | Status: DC | PRN
Start: 2014-06-21 — End: 2014-07-01

## 2014-06-21 NOTE — Progress Notes (Signed)
OOB post bedrest. Ambulate to hallway. Voided. Accessed site remained CDI, no hematoma.

## 2014-06-21 NOTE — Anesthesia Postprocedure Evaluation (Signed)
Patient discharged no anesthesia complications noted

## 2014-06-21 NOTE — Plan of Care (Signed)
Problem: Hemodynamic Status: Cardiac  Goal: Stable vital signs and fluid balance  Intervention: Assess incision for bleeding/hematoma.  Right groin  clean dry no hematoma patient ambulating

## 2014-06-21 NOTE — Discharge Instructions (Signed)
Interventional Cardiovascular Admission and Recovery  Discharge Instructions      ACTIVITY:  1. No driving for 24 hours following your procedure due to medications you may have received.  2. Rest today and tomorrow, gradually resuming your usual activities.  3. Limit stair usage for the next 24 hours. If you must use the stairs, take one stair at a time leading with the unaffected leg.  Support the puncture site/s with your hand.  4. Do not lift anything over 10 pounds in weight and you should not do any strenuous activities for at least 3 days.  That includes pushing, pulling, dragging or moving anything.  5. Ask the doctor when you may return to work.    WOUND/INCISION CARE:  1. You may shower 24 hours after your procedure. REMOVE the bandage/s, let the water passively flow over the site/s, wash gently with your hand, pat the area dry. Leave site/s open to air.  Do not submerge access site in water (bath tub, pool, etc) until the area is completely healed.  2. Do not apply any creams, powders, lotions or ointments to the site/s.  3. Do not rub, scrub, pick, scratch or even use a wash cloth over the site/s.  4. Observe for signs of infection:  redness, warmth, swelling, drainage or if you have a temperature greater than 100 degrees F.  If you suspect infection call the doctor who performed the procedure.  5. You may have some tenderness. May take ACETAMINOPHEN (Tylenol) if needed.  6. You may experience some mild bruising.  7. If you notice bleeding either at the incision site or underneath the skin, you should lay down flat and hold pressure on the area for 10 minutes and call your doctor.  If the bleeding does not stop, you should continue to hold pressure and call 911.  8. If your leg/ arm becomes cold, numb, painful, grayish in color, or change from usual color/sensation occurs, you should call 911.

## 2014-06-21 NOTE — Progress Notes (Addendum)
Cardiology Progress Note  NP service with Dr     Date Time: 06/21/2014 8:25 AM  Patient Name: Jonathan Jonathan [name]      Assessment:     Past Medical History   Diagnosis Date   . Hypertension    . Sick sinus syndrome    . Coronary artery disease 01/2014     mild inf ischemia   . Bronchiectasis    . Atrial fibrillation 7/14 dx     holter7/14 rates 30-111, 05/2014 27-146, Avg 41  no pauses >2.5 sec   . Atrial fibrillation      Holter 10/15 rates in 30s mostly 7 pm to 7 am, > 100 w exercise only   . Dyspnea on exertion    . Cardiac arrhythmia    . Hyperlipidemia      pt. denies pain        Patient Active Problem List   Diagnosis   . AF (atrial fibrillation)   . Sinus node dysfunction   . Essential hypertension   . Chest discomfort   . Chest pain, unspecified chest pain type   . Coronary artery disease involving native coronary artery of native heart without angina pectoris   . Personal history of long-term (current) use of anticoagulants   . Atrial fibrillation, unspecified     Plan:    Discharge pt home after pt ambulates okay after suture removal. Suture removed without difficulty. Dr. Cyndra Numbers to see pt by 1000 am.   Carloyn Manner 8/Mag this am   Follow up with Dr. Lujean Amel in 10-14 days   Continue on home meds coumadin, losartan, flonase, xanax prn, ambien prn (aware not to take both xanax and ambien together)   To have follow up INR with cardiology, Dr Franchot Erichsen    Subjective:   Denies chest pain, SOB, palpitations.  Pt with muscle twitching to rt lat calf which is visible. No pain      Physical Exam:   BP 132/78 mmHg  Pulse 70  Temp(Src) 97.5 F (36.4 Jonathan [name]) (Oral)  Resp 18  Ht 1.854 m (6\' 1" )  Wt 87.544 kg (193 lb)  BMI 25.47 kg/m2  SpO2 98%  Temp (24hrs), Avg:96.7 F (35.9 Jonathan [name]), Min:95 F (35 Jonathan [name]), Max:97.5 F (36.4 Jonathan [name])      Temp:  [95 F (35 Jonathan [name])-97.5 F (36.4 Jonathan [name])] 97.5 F (36.4 Jonathan [name])  Heart Rate:  [49-70] 70  Resp Rate:  [16-20] 18  BP: (115-151)/(71-92) 132/78 mmHg    Patient Vitals for the past 24 hrs:   BP Temp  Temp src Pulse Resp SpO2   06/21/14 0730 132/78 mmHg 97.5 F (36.4 Jonathan [name]) - 70 18 98 %   06/21/14 0450 122/74 mmHg - - 62 16 98 %   06/21/14 0100 115/71 mmHg - - 68 - 99 %   06/21/14 0030 128/76 mmHg - - 64 16 98 %   06/21/14 0000 128/81 mmHg - - 64 - 99 %   06/20/14 2330 135/83 mmHg - - 62 - 99 %   06/20/14 2300 144/90 mmHg - - 66 - 100 %   06/20/14 2230 136/84 mmHg - - 60 - 98 %   06/20/14 2200 138/87 mmHg - - 60 - 98 %   06/20/14 2130 137/89 mmHg - - 60 - 98 %   06/20/14 2100 (!) 142/91 mmHg - - 64 - 98 %   06/20/14 2030 (!) 142/91 mmHg - - 64 - 99 %   06/20/14 1930 123/73 mmHg - - 62 -  99 %   06/20/14 1919 125/78 mmHg 97.5 F (36.4 Jonathan [name]) Oral 61 20 98 %   06/20/14 1915 125/78 mmHg - - 64 - 98 %   06/20/14 1300 142/82 mmHg (!) 95 F (35 Jonathan [name]) Oral (!) 49 16 100 %       Weight change:   Intake and Output Summary (Last 24 hours) at Date Time    Intake/Output Summary (Last 24 hours) at 06/21/14 0825  Last data filed at 06/20/14 1920   Gross per 24 hour   Intake    600 ml   Output      0 ml   Net    600 ml       General appearance - alert, well appearing, and in no distress  Chest - clear to auscultation, no wheezes, rales or rhonchi, symmetric air entry  Heart - normal rate and regular rhythm, S1 and S2 normal, no murmurs noted  Abdomen - soft, nontender, nondistended  Extremities - DP pulses 2+, no pedal edema, no clubbing or cyanosis; Rt groin site with black suture removed without problem. Rt lateral calf with muscular twitching. Discussed with Dr. Lane Hacker.    Medications:      Current Facility-Administered Medications   Medication Dose Route Frequency   . cefuroxime  1.5 g Intravenous Q8H SCH   . losartan  50 mg Oral Daily   . warfarin  5 mg Oral Daily at 1800       Labs:     Results     Procedure Component Value Units Date/Time    Protime-INR [[id number]]  (Abnormal) Collected:  06/20/14 1400    Specimen Information:  Blood Updated:  06/20/14 1420     PT 21.1 (H) sec      PT INR 1.8 (H)      PT Anticoag. Given  Within 48 hrs. warfarin ([place]             Recent Labs  Lab 06/20/14  1400   PT 21.1*   PT INR 1.8*       No results for input(s): CK, TROPI, TROPT, CKMBINDEX in the last 168 hours.    No results for input(s): DIG in the last 168 hours.    No results found for: BNP    No results found for: DDIMER No results for input(s): GLU, BUN, CREAT, CA, NA, K, CL, CO2, ALB, PHOS, MG, AST, ALT, TSH in the last 168 hours.    Invalid input(s): ALP, TP    No results found for: HGBA1C    No results for input(s): CHOL, TRIG, HDL, LDL in the last 168 hours.           Rads:     Radiology Results (24 Hour)     Procedure Component Value Units Date/Time    XR Chest AP Portable [[id number]] Collected:  06/20/14 2014    Order Status:  Completed Updated:  06/20/14 2035    Narrative:      History: Post pacemaker placement    Findings: Portable supine chest demonstrates a 5 cm linear leadless  pacemaker projected over the right ventricle. There is unfolding of  thoracic aorta. Degenerative changes of the spine. Normal vasculature.  No focal opacity, pleural effusion or pneumothorax      Impression:      Impression: Lead less pacemaker, as above    Genelle Bal, MD   06/20/2014 8:19 PM            Cardiographics:  Telemetry: V paced 70    Signed by: Drema Balzarine, NP  [facility], Cardiology  Spect [phone]       Patient seen and examined.    Stable after NANOSTIM leadless pacemaker.    Groin stable, tele with AF with V  Pacing.  Normal single lead pacemaker interrogation with excellent sensing/threshold/impedance and battery.    All questions answered.       [place] home, plan as above.    If muscle twitching continues, will need neuro eval as outpatient.    ----------------------------  Alvia Grove, MD  [facility]   Pager [phone]  Office [phone]  Spectralink Ext# [phone]

## 2014-06-21 NOTE — Plan of Care (Signed)
Problem: Hemodynamic Status: Cardiac  Goal: Stable vital signs and fluid balance  Intervention: Assess signs and symptoms associated with cardiac rhythm changes  S/p pacemaker implant v-paced 100 %

## 2014-06-21 NOTE — Discharge Summary -  Nursing (Signed)
Patient given discharge instruction verbalized understanding iv d/cd telemetry d/cd discharged home with family

## 2014-06-22 LAB — ECG 12-LEAD
Atrial Rate: 61 {beats}/min
Q-T Interval: 498 ms
QRS Duration: 192 ms
QTC Calculation (Bezet): 526 ms
R Axis: -54 degrees
T Axis: 90 degrees
Ventricular Rate: 67 {beats}/min

## 2014-06-23 ENCOUNTER — Encounter (INDEPENDENT_AMBULATORY_CARE_PROVIDER_SITE_OTHER): Payer: Self-pay | Admitting: Clinical Cardiac Electrophysiology

## 2014-06-30 ENCOUNTER — Institutional Professional Consult (permissible substitution) (INDEPENDENT_AMBULATORY_CARE_PROVIDER_SITE_OTHER): Payer: No Typology Code available for payment source | Admitting: Clinical Cardiac Electrophysiology

## 2014-07-01 ENCOUNTER — Ambulatory Visit (INDEPENDENT_AMBULATORY_CARE_PROVIDER_SITE_OTHER): Payer: No Typology Code available for payment source | Admitting: Physician Assistant

## 2014-07-01 VITALS — BP 120/70 | HR 80 | Ht 73.0 in | Wt 197.0 lb

## 2014-07-01 DIAGNOSIS — I481 Persistent atrial fibrillation: Secondary | ICD-10-CM

## 2014-07-01 DIAGNOSIS — Z7901 Long term (current) use of anticoagulants: Secondary | ICD-10-CM

## 2014-07-01 DIAGNOSIS — I4819 Other persistent atrial fibrillation: Secondary | ICD-10-CM

## 2014-07-01 DIAGNOSIS — I495 Sick sinus syndrome: Secondary | ICD-10-CM

## 2014-07-01 DIAGNOSIS — Z9229 Personal history of other drug therapy: Secondary | ICD-10-CM

## 2014-07-01 DIAGNOSIS — Z950 Presence of cardiac pacemaker: Secondary | ICD-10-CM | POA: Insufficient documentation

## 2014-07-01 NOTE — Progress Notes (Signed)
Have you sought care outside of [place] since we last saw you?   No

## 2014-07-01 NOTE — Progress Notes (Signed)
[facility] OFFICE VISIT    [name] presents to the office today for leadless pacemaker site check.   He is a 63 yo male with chronic atrial fibrillation and slow ventricular response who underwent nanostim leadless pacemaker implantation on 06/20/14.  He has a history of a TIA several years ago and has a CHADS VASc of 5. He has been feeling well since implant with the exception of low energy which he attributes to not being able to exercise.   He has multiple questions about the device and settings.  Mr. Prouty denies chest pain, pressure, palpitations, SOB, dizziness, lightheadedness, pre syncope or syncope.         PMH:   Past Medical History   Diagnosis Date   . Hypertension    . Sick sinus syndrome    . Coronary artery disease 01/2014     mild inf ischemia   . Bronchiectasis    . Atrial fibrillation 7/14 dx     holter7/14 rates 30-111, 05/2014 27-146, Avg 41  no pauses >2.5 sec   . Atrial fibrillation      Holter 10/15 rates in 30s mostly 7 pm to 7 am, > 100 w exercise only   . Dyspnea on exertion    . Cardiac arrhythmia    . Hyperlipidemia      pt. denies pain        MEDICATIONS:   Current Outpatient Prescriptions   Medication Sig Dispense Refill   . losartan (COZAAR) 50 MG tablet Take 1 tablet (50 mg total) by mouth daily. 90 tablet 3   . warfarin (COUMADIN) 10 MG tablet Take 10 mg by mouth daily. Adjusted for inr       . Fluticasone Propionate (FLONASE NA) 1 puff by Nasal route as needed.       No current facility-administered medications for this visit.        SH:   History   Substance Use Topics   . Smoking status: Former Smoker -- 1.00 packs/day for 10 years     Quit date: 03/28/1984   . Smokeless tobacco: Never Used   . Alcohol Use: Yes      Comment: socially       REVIEW OF SYSTEMS: As above per HPI    PHYSICAL EXAM:  Filed Vitals:    07/01/14 1044   BP: 120/70   Pulse: 80   Height: 1.854 m (6\' 1" )   Weight: 89.359 kg (197 lb)     Gen:  well-developed, well-nourished  year old male, alert and  oriented and in no acute distress  HEENT: normocephalic, atraumatic; sclera anicteric  Neck: good range of motion with trachea midline and no JVD appreciated  Chest:  good air entry bilaterally and lungs are clear to auscultation bilaterally  Heart: normal S1, S2; regular rate and rhythm; no significant murmur, rubs or gallops  Extremities: warm without edema         DEVICE INTERROGATION/REPROGRAMMING:    St Jude nano stim set in the VVIR mode with lpr 60 bpm.  98% V paced  Impedence stable.  R wave sensed at 9mv.  Threshold .5v.  Battery at BOL.      IMPRESSION/RECOMMENDATIONS:   1. Persistent atrial fibrillation     2. Personal history of long-term (current) use of anticoagulants     3. Sinus node dysfunction     4. Cardiac pacemaker in situ--leadless nano stim         [name] is stable  from an arrhythmia standpoint at this time.  No changes will be made to the device or medications.  He did have questions about his heart rates and possibly decreasing the rate responsiveness at his next visit if he feels it is too sensitive when he exercises.  His ongoing restrictions were reviewed.  The patient will follow up with our office in 4 weeks per research protocol.    ----------------------------  Ramon Dredge, PA-C  [facility]  Spectra link [phone]  Office [phone]    Incident to service performed with physician present in the office in accordance with this patient's established plan of care.

## 2014-08-08 ENCOUNTER — Other Ambulatory Visit
Admission: RE | Admit: 2014-08-08 | Discharge: 2014-08-08 | Disposition: A | Discharge status: Home / Self Care | Payer: No Typology Code available for payment source | Source: Ambulatory Visit | Attending: Cardiology | Admitting: Cardiology

## 2014-08-08 ENCOUNTER — Encounter (INDEPENDENT_AMBULATORY_CARE_PROVIDER_SITE_OTHER): Payer: Self-pay | Admitting: Physician Assistant

## 2014-08-08 ENCOUNTER — Ambulatory Visit (INDEPENDENT_AMBULATORY_CARE_PROVIDER_SITE_OTHER): Payer: No Typology Code available for payment source | Admitting: Physician Assistant

## 2014-08-08 VITALS — BP 130/86 | Ht 73.0 in | Wt 191.0 lb

## 2014-08-08 DIAGNOSIS — I4819 Other persistent atrial fibrillation: Secondary | ICD-10-CM

## 2014-08-08 DIAGNOSIS — I2510 Atherosclerotic heart disease of native coronary artery without angina pectoris: Secondary | ICD-10-CM

## 2014-08-08 DIAGNOSIS — I481 Persistent atrial fibrillation: Secondary | ICD-10-CM

## 2014-08-08 DIAGNOSIS — I4891 Unspecified atrial fibrillation: Secondary | ICD-10-CM | POA: Insufficient documentation

## 2014-08-08 DIAGNOSIS — Z950 Presence of cardiac pacemaker: Secondary | ICD-10-CM

## 2014-08-08 LAB — PT/INR
PT INR: 1.6 — ABNORMAL HIGH (ref 0.9–1.1)
PT: 19 s — ABNORMAL HIGH (ref 12.6–15.0)

## 2014-08-08 NOTE — Progress Notes (Signed)
[facility] OFFICE VISIT    Jonathan [name] presents to the office today for leadless pacemaker site check.   He is a 64 yo male with chronic atrial fibrillation and slow ventricular response who underwent nanostim leadless pacemaker implantation on 06/20/14.  He has a history of a TIA several years ago and has a CHADS VASc of 5. He has been feeling well since his last visit, but has still not noted much of an improvement in energy with exercise.  He does state that after a work out, he feels an improvement, but not during.   Jonathan [name] denies chest pain, pressure, palpitations, SOB, dizziness, lightheadedness, pre syncope or syncope.  He follows with Dr.Rosenblatt.        PMH:   Past Medical History   Diagnosis Date   . Hypertension    . Sick sinus syndrome    . Coronary artery disease 01/2014     mild inf ischemia   . Bronchiectasis    . Atrial fibrillation 7/14 dx     holter7/14 rates 30-111, 05/2014 27-146, Avg 41  no pauses >2.5 sec   . Atrial fibrillation      Holter 10/15 rates in 30s mostly 7 pm to 7 am, > 100 w exercise only   . Dyspnea on exertion    . Cardiac arrhythmia    . Hyperlipidemia      pt. denies pain        MEDICATIONS:   Current Outpatient Prescriptions   Medication Sig Dispense Refill   . aspirin EC 81 MG EC tablet Take 81 mg by mouth daily.     . Fluticasone Propionate (FLONASE NA) 1 puff by Nasal route as needed.     Marland Kitchen losartan (COZAAR) 50 MG tablet Take 1 tablet (50 mg total) by mouth daily. 90 tablet 3   . warfarin (COUMADIN) 10 MG tablet Take 10 mg by mouth daily. Adjusted for inr         No current facility-administered medications for this visit.        SH:   History   Substance Use Topics   . Smoking status: Former Smoker -- 1.00 packs/day for 10 years     Quit date: 03/28/1984   . Smokeless tobacco: Never Used   . Alcohol Use: Yes      Comment: socially       REVIEW OF SYSTEMS: As above per HPI    PHYSICAL EXAM:  Filed Vitals:    08/08/14 0955   BP: 130/86   Height: 1.854 m (6\' 1" )    Weight: 86.637 kg (191 lb)     Gen:  well-developed, well-nourished  year old male, alert and oriented and in no acute distress  HEENT: normocephalic, atraumatic; sclera anicteric  Neck: good range of motion with trachea midline and no JVD appreciated  Chest:  good air entry bilaterally and lungs are clear to auscultation bilaterally  Heart: normal S1, S2; regular rate and rhythm; no significant murmur, rubs or gallops  Extremities: warm without edema         DEVICE INTERROGATION/REPROGRAMMING:    St Jude nano stim set in the VVIR mode with lpr 60 bpm.  95% V paced, heart rates range from 60-90s per histograms.  Impedence 500.  R wave sensed at 9.90mv.  Threshold 1.25v at .4ms.  Battery at BOL.        IMPRESSION/RECOMMENDATIONS:   1. Persistent atrial fibrillation     2. Cardiac pacemaker in situ--leadless nano stim  3. Coronary artery disease involving native coronary artery of native heart without angina pectoris         Jonathan [name] is stable from an arrhythmia standpoint at this time.  We did take the liberty of increasing his upper pacing rate to 130bpm, given his level of exercise. The patient will follow up with our office in 6 weeks per research protocol.  We may consider preforming an exercise stress test in the future to evaluate his heart rates with activity if this programming change doesn't improve his symptoms during exercise.    ----------------------------  Jonathan Dredge, PA-C  [facility]  Spectra link [phone]  Office [phone]    Incident to service performed with physician present in the office in accordance with this patient's established plan of care.

## 2014-09-19 ENCOUNTER — Encounter (INDEPENDENT_AMBULATORY_CARE_PROVIDER_SITE_OTHER): Payer: Self-pay | Admitting: Physician Assistant

## 2014-09-19 ENCOUNTER — Ambulatory Visit (INDEPENDENT_AMBULATORY_CARE_PROVIDER_SITE_OTHER): Payer: Medicare Other | Admitting: Physician Assistant

## 2014-09-19 VITALS — BP 132/92 | Ht 73.0 in | Wt 195.0 lb

## 2014-09-19 DIAGNOSIS — I4819 Other persistent atrial fibrillation: Secondary | ICD-10-CM

## 2014-09-19 DIAGNOSIS — I481 Persistent atrial fibrillation: Secondary | ICD-10-CM

## 2014-09-19 DIAGNOSIS — I495 Sick sinus syndrome: Secondary | ICD-10-CM

## 2014-09-19 DIAGNOSIS — Z950 Presence of cardiac pacemaker: Secondary | ICD-10-CM

## 2014-09-19 NOTE — Progress Notes (Signed)
[facility] OFFICE VISIT    Jonathan [name] presents to the office today for leadless pacemaker site check.   He is a 64 yo male with chronic atrial fibrillation and slow ventricular response who underwent nanostim leadless pacemaker implantation on 06/20/14.  He has a history of a TIA several years ago and has a CHADS VASc of 5. He has been feeling well since his last visit, but again has not noted much of an improvement in energy with exercise.  He feels as if he is "slowing down".  He wonders if his lower pacing rate should be decreased as his body was used to lower rates prior to device implant.  Jonathan [name] denies chest pain, pressure, palpitations, SOB, dizziness, lightheadedness, pre syncope or syncope. He denies weight gain, pedal edema, PND/orthopnea.  He follows with Dr.Rosenblatt.        PMH:   Past Medical History   Diagnosis Date   . Hypertension    . Sick sinus syndrome    . Coronary artery disease 01/2014     mild inf ischemia   . Bronchiectasis    . Atrial fibrillation 7/14 dx     holter7/14 rates 30-111, 05/2014 27-146, Avg 41  no pauses >2.5 sec   . Atrial fibrillation      Holter 10/15 rates in 30s mostly 7 pm to 7 am, > 100 w exercise only   . Dyspnea on exertion    . Cardiac arrhythmia    . Hyperlipidemia      pt. denies pain        MEDICATIONS:   Current Outpatient Prescriptions   Medication Sig Dispense Refill   . aspirin EC 81 MG EC tablet Take 81 mg by mouth daily.     . Fluticasone Propionate (FLONASE NA) 1 puff by Nasal route as needed.     Marland Kitchen losartan (COZAAR) 50 MG tablet Take 1 tablet (50 mg total) by mouth daily. 90 tablet 3   . warfarin (COUMADIN) 10 MG tablet Take 10 mg by mouth daily. Adjusted for inr         No current facility-administered medications for this visit.        SH:   History   Substance Use Topics   . Smoking status: Former Smoker -- 1.00 packs/day for 10 years     Quit date: 03/28/1984   . Smokeless tobacco: Never Used   . Alcohol Use: Yes      Comment: socially        REVIEW OF SYSTEMS: As above per HPI    PHYSICAL EXAM:  Filed Vitals:    09/19/14 1118   BP: 132/92   Height: 1.854 m (6\' 1" )   Weight: 88.451 kg (195 lb)     Gen:  well-developed, well-nourished  year old male, alert and oriented and in no acute distress  HEENT: normocephalic, atraumatic; sclera anicteric  Neck: good range of motion with trachea midline and no JVD appreciated  Chest:  good air entry bilaterally and lungs are clear to auscultation bilaterally  Heart: normal S1, S2; regular rate and rhythm; no significant murmur, rubs or gallops  Extremities: warm without edema         DEVICE INTERROGATION/REPROGRAMMING:    St Jude nano stim set in the VVIR mode with lpr 60 bpm.  95% V paced, heart rates range from 60-90s per histograms.  Impedence 460.  R wave sensed at 10mv.  Threshold .5v at .4ms.  Battery at BOL.  IMPRESSION/RECOMMENDATIONS:   1. Persistent atrial fibrillation     2. Sinus node dysfunction     3. Cardiac pacemaker in situ--leadless nano stim         Jonathan [name] is stable from an arrhythmia standpoint at this time.  Outputs were decreased today to optimize battery life.  Hysteresis was turned on to allow for intrinsic conduction between 50 and 60bpm.  He is predominantly RV paced, and should probably undergo a repeat echo given his exercise intolerance in the next few months with Dr.Rosenblatt.  The patient will follow up with our office in 3 weeks per research protocol and will see Dr.Wish for re-evaluation.  We may consider preforming an exercise stress test in the future to evaluate his heart rates with activity if this programming change doesn't improve his symptoms during exercise.    ----------------------------  Ramon Dredge, PA-C  [facility]  Spectra link [phone]  Office [phone]    Incident to service performed with physician present in the office in accordance with this patient's established plan of care.

## 2014-10-03 ENCOUNTER — Ambulatory Visit (INDEPENDENT_AMBULATORY_CARE_PROVIDER_SITE_OTHER): Payer: No Typology Code available for payment source | Admitting: Cardiology

## 2014-10-07 ENCOUNTER — Ambulatory Visit (INDEPENDENT_AMBULATORY_CARE_PROVIDER_SITE_OTHER): Payer: Medicare Other | Admitting: Family Medicine

## 2014-10-07 ENCOUNTER — Encounter (INDEPENDENT_AMBULATORY_CARE_PROVIDER_SITE_OTHER): Payer: Self-pay | Admitting: Family Medicine

## 2014-10-07 ENCOUNTER — Ambulatory Visit (FREE_STANDING_LABORATORY_FACILITY): Payer: Medicare Other | Admitting: Family Medicine

## 2014-10-07 VITALS — BP 121/73 | HR 70 | Ht 73.0 in | Wt 196.0 lb

## 2014-10-07 DIAGNOSIS — F419 Anxiety disorder, unspecified: Secondary | ICD-10-CM

## 2014-10-07 DIAGNOSIS — Z7901 Long term (current) use of anticoagulants: Secondary | ICD-10-CM

## 2014-10-07 DIAGNOSIS — J479 Bronchiectasis, uncomplicated: Secondary | ICD-10-CM

## 2014-10-07 DIAGNOSIS — I10 Essential (primary) hypertension: Secondary | ICD-10-CM

## 2014-10-07 DIAGNOSIS — R0600 Dyspnea, unspecified: Secondary | ICD-10-CM

## 2014-10-07 DIAGNOSIS — I495 Sick sinus syndrome: Secondary | ICD-10-CM

## 2014-10-07 DIAGNOSIS — I2510 Atherosclerotic heart disease of native coronary artery without angina pectoris: Secondary | ICD-10-CM

## 2014-10-07 DIAGNOSIS — Z9229 Personal history of other drug therapy: Secondary | ICD-10-CM

## 2014-10-07 DIAGNOSIS — G43109 Migraine with aura, not intractable, without status migrainosus: Secondary | ICD-10-CM

## 2014-10-07 DIAGNOSIS — I482 Chronic atrial fibrillation, unspecified: Secondary | ICD-10-CM

## 2014-10-07 DIAGNOSIS — R209 Unspecified disturbances of skin sensation: Secondary | ICD-10-CM

## 2014-10-07 DIAGNOSIS — E782 Mixed hyperlipidemia: Secondary | ICD-10-CM

## 2014-10-07 DIAGNOSIS — G459 Transient cerebral ischemic attack, unspecified: Secondary | ICD-10-CM

## 2014-10-07 DIAGNOSIS — R7309 Other abnormal glucose: Secondary | ICD-10-CM

## 2014-10-07 DIAGNOSIS — R042 Hemoptysis: Secondary | ICD-10-CM

## 2014-10-07 DIAGNOSIS — Z1211 Encounter for screening for malignant neoplasm of colon: Secondary | ICD-10-CM

## 2014-10-07 DIAGNOSIS — R5383 Other fatigue: Secondary | ICD-10-CM

## 2014-10-07 DIAGNOSIS — R0609 Other forms of dyspnea: Secondary | ICD-10-CM

## 2014-10-07 DIAGNOSIS — M542 Cervicalgia: Secondary | ICD-10-CM

## 2014-10-07 DIAGNOSIS — Z950 Presence of cardiac pacemaker: Secondary | ICD-10-CM

## 2014-10-07 DIAGNOSIS — M545 Low back pain: Secondary | ICD-10-CM

## 2014-10-07 LAB — CBC AND DIFFERENTIAL
Basophils Absolute Automated: 0.06 10*3/uL (ref 0.00–0.20)
Basophils Automated: 1 %
Eosinophils Absolute Automated: 0.05 10*3/uL (ref 0.00–0.70)
Eosinophils Automated: 1 %
Hematocrit: 41 % — ABNORMAL LOW (ref 42.0–52.0)
Hgb: 13.3 g/dL (ref 13.0–17.0)
Immature Granulocytes Absolute: 0.03 10*3/uL
Immature Granulocytes: 0 %
Lymphocytes Absolute Automated: 1.25 10*3/uL (ref 0.50–4.40)
Lymphocytes Automated: 15 %
MCH: 30.3 pg (ref 28.0–32.0)
MCHC: 32.4 g/dL (ref 32.0–36.0)
MCV: 93.4 fL (ref 80.0–100.0)
MPV: 10.8 fL (ref 9.4–12.3)
Monocytes Absolute Automated: 0.65 10*3/uL (ref 0.00–1.20)
Monocytes: 8 %
Neutrophils Absolute: 6.19 10*3/uL (ref 1.80–8.10)
Neutrophils: 75 %
Nucleated RBC: 0 /100 WBC (ref 0–1)
Platelets: 248 10*3/uL (ref 140–400)
RBC: 4.39 10*6/uL — ABNORMAL LOW (ref 4.70–6.00)
RDW: 14 % (ref 12–15)
WBC: 8.23 10*3/uL (ref 3.50–10.80)

## 2014-10-07 LAB — LIPID PANEL
Cholesterol / HDL Ratio: 3.4
Cholesterol: 171 mg/dL (ref 0–199)
HDL: 51 mg/dL (ref >40)
LDL Calculated: 110 mg/dL — ABNORMAL HIGH (ref 0–99)
Triglycerides: 51 mg/dL (ref 34–149)
VLDL Calculated: 10 mg/dL (ref 10–40)

## 2014-10-07 LAB — PT/INR
PT INR: 1.7 — ABNORMAL HIGH (ref 0.9–1.1)
PT: 19.5 s — ABNORMAL HIGH (ref 12.6–15.0)

## 2014-10-07 LAB — COMPREHENSIVE METABOLIC PANEL
ALT: 19 U/L (ref 0–55)
AST (SGOT): 25 U/L (ref 5–34)
Albumin/Globulin Ratio: 1.2 (ref 0.9–2.2)
Albumin: 3.9 g/dL (ref 3.5–5.0)
Alkaline Phosphatase: 90 U/L (ref 38–106)
BUN: 16 mg/dL (ref 9.0–28.0)
Bilirubin, Total: 0.6 mg/dL (ref 0.1–1.2)
CO2: 28 mEq/L (ref 21–30)
Calcium: 9.8 mg/dL (ref 8.5–10.5)
Chloride: 106 mEq/L (ref 100–111)
Creatinine: 1 mg/dL (ref 0.5–1.5)
Globulin: 3.2 g/dL (ref 2.0–3.7)
Glucose: 116 mg/dL — ABNORMAL HIGH (ref 70–100)
Potassium: 5 mEq/L (ref 3.5–5.3)
Protein, Total: 7.1 g/dL (ref 6.0–8.3)
Sodium: 142 mEq/L (ref 135–146)

## 2014-10-07 LAB — GFR: EGFR: >60

## 2014-10-07 LAB — HEMOGLOBIN A1C: Hemoglobin A1C: 6 % (ref 0.0–6.0)

## 2014-10-07 LAB — HEMOLYSIS INDEX: Hemolysis Index: 5 (ref 0–18)

## 2014-10-07 LAB — TSH: TSH: 2.14 u[IU]/mL (ref 0.35–4.94)

## 2014-10-07 NOTE — Progress Notes (Signed)
Patient ID: Jonathan [name]  is a 64 y.o.  male.     Chief Complaint   Patient presents with   . Hypertension   . Rx for Colonosopy     Pt. is due for colon cancer screening    . Fatigue        HPI    Mr. Essner is new to [place].  He used to see Dr. Chestine Spore before he left medical practice.     Blood pressure is under good control.    Mr. Mackowski is very discouraged that he seems to have had a decline in his stamina over the last two years.  He has had heart issues as well as bronchiectasis.  He does have recurrent hemoptysis for which he has seen pulmonology and no treatment was deemed necessary.    Colon cancer screening is needed.    Labwork should be done.  He has had abnormal glucose in the past and with CAD lipids need to be checked.    The following portions of the patient's history were reviewed and updated as appropriate: current medications, allergies, past family history, past medical history, past social history, past surgical history and problem list.     Review of Systems   Constitutional: Positive for fatigue.        Decreased stamina   Respiratory: Positive for cough.         Hemoptysis when he tries to cough up the mucus that forms when he works out.   Cardiovascular: Negative.    Gastrointestinal: Negative.    Genitourinary: Negative.    Musculoskeletal: Positive for back pain and neck pain.   Neurological: Positive for numbness.        Cold hands and feet   All other systems reviewed and are negative.           Objective:   BP 121/73 mmHg  Pulse 70  Ht 1.854 m (6\' 1" )  Wt 88.905 kg (196 lb)  BMI 25.86 kg/m2 Body mass index is 25.86 kg/(m^2).    Physical Exam   Constitutional: He is oriented to person, place, and time. He appears well-developed and well-nourished.   HENT:   Head: Normocephalic and atraumatic.   Right Ear: External ear normal.   Left Ear: External ear normal.   Nose: Nose normal.   Mouth/Throat: Oropharynx is clear and moist.   Eyes: Conjunctivae and EOM are normal. Pupils are equal, round,  and reactive to light.   Neck: Normal range of motion. Neck supple.   Cardiovascular: Normal rate, regular rhythm, normal heart sounds and intact distal pulses.    Pulmonary/Chest: Effort normal and breath sounds normal.   Abdominal: Soft. Bowel sounds are normal.   Musculoskeletal: Normal range of motion.   Neurological: He is alert and oriented to person, place, and time.   Skin: Skin is warm and dry.   Psychiatric: He has a normal mood and affect. His behavior is normal. Judgment and thought content normal.   Nursing note and vitals reviewed.         Assessment:   Jase was seen today for hypertension, rx for colonosopy and fatigue.    Diagnoses and all orders for this visit:    Essential hypertension    Colon cancer screening  Orders:  -     Ambulatory referral to Gastroenterology    Personal history of long-term (current) use of anticoagulants  Orders:  -     Protime-INR    Chronic atrial fibrillation  Orders:  -     Protime-INR    Hemoptysis  Orders:  -     CBC and differential  -     Protime-INR    Other fatigue  Orders:  -     CBC and differential  -     TSH    Abnormal glucose  Orders:  -     Hemoglobin A1c    Coronary artery disease involving native coronary artery of native heart without angina pectoris  Orders:  -     Comprehensive Metabolic Panel  -     Lipid panel    Sick sinus syndrome    Bronchiectasis without complication    Dyspnea on exertion    Mixed hyperlipidemia    Pacemaker    Transient cerebral ischemia, unspecified type    Migraine equivalent    Neck pain    Midline low back pain, with sciatica presence unspecified    Cold hands and feet    Anxiety    Other orders  -     Hemolysis index  -     GFR            Plan:   Orders as above    Hypertension is controlled.  Continue current medications.    Colon cancer screening.  Refer to gastroenterology.    Sick sinus syndrome s/p pacemaker is stable.    CAD/hyperlipidemia.  Check labs.  Follow up with cardiology per their  routine.    Bronchiectasis with frequent hemoptysis.  Follow up with pulmonary prn.    Abnormal glucose.  Check lab.    Fatigue.  Check labs.    TIA only occurred once and was never worked up.    Migraine equivalent.  Monitor for now.    Cervical and lumbar pain with numbness in extremities and cold hands and feet.  Conservative management at this time.    Anxiety is not being addressed.      Risk & Benefits of any previous or new medication(s) were explained to the patient who verbalized understanding & agreed to the treatment plan.

## 2014-10-08 ENCOUNTER — Encounter (INDEPENDENT_AMBULATORY_CARE_PROVIDER_SITE_OTHER): Payer: Self-pay | Admitting: Family Medicine

## 2014-10-08 DIAGNOSIS — Z85828 Personal history of other malignant neoplasm of skin: Secondary | ICD-10-CM | POA: Insufficient documentation

## 2014-10-08 DIAGNOSIS — M549 Dorsalgia, unspecified: Secondary | ICD-10-CM | POA: Insufficient documentation

## 2014-10-08 DIAGNOSIS — I4891 Unspecified atrial fibrillation: Secondary | ICD-10-CM | POA: Insufficient documentation

## 2014-10-08 DIAGNOSIS — Z8673 Personal history of transient ischemic attack (TIA), and cerebral infarction without residual deficits: Secondary | ICD-10-CM | POA: Insufficient documentation

## 2014-10-08 DIAGNOSIS — E785 Hyperlipidemia, unspecified: Secondary | ICD-10-CM | POA: Insufficient documentation

## 2014-10-08 DIAGNOSIS — R0609 Other forms of dyspnea: Secondary | ICD-10-CM | POA: Insufficient documentation

## 2014-10-08 DIAGNOSIS — G43109 Migraine with aura, not intractable, without status migrainosus: Secondary | ICD-10-CM | POA: Insufficient documentation

## 2014-10-08 DIAGNOSIS — R209 Unspecified disturbances of skin sensation: Secondary | ICD-10-CM | POA: Insufficient documentation

## 2014-10-08 DIAGNOSIS — I499 Cardiac arrhythmia, unspecified: Secondary | ICD-10-CM | POA: Insufficient documentation

## 2014-10-08 DIAGNOSIS — J479 Bronchiectasis, uncomplicated: Secondary | ICD-10-CM | POA: Insufficient documentation

## 2014-10-08 DIAGNOSIS — R0600 Dyspnea, unspecified: Secondary | ICD-10-CM | POA: Insufficient documentation

## 2014-10-08 DIAGNOSIS — Z950 Presence of cardiac pacemaker: Secondary | ICD-10-CM | POA: Insufficient documentation

## 2014-10-08 DIAGNOSIS — F419 Anxiety disorder, unspecified: Secondary | ICD-10-CM | POA: Insufficient documentation

## 2014-10-08 DIAGNOSIS — M542 Cervicalgia: Secondary | ICD-10-CM | POA: Insufficient documentation

## 2014-10-08 DIAGNOSIS — I10 Essential (primary) hypertension: Secondary | ICD-10-CM | POA: Insufficient documentation

## 2014-10-08 DIAGNOSIS — Z8679 Personal history of other diseases of the circulatory system: Secondary | ICD-10-CM | POA: Insufficient documentation

## 2014-10-08 NOTE — Progress Notes (Signed)
Quick Note:    Call pt about results. INR is low. I know he gets a little nervous about having it too high due to the hemoptysis he has. I will share these results with Dr. Franchot Erichsen who has been managing his anticoagulation.    Blood count is fine.    Blood chemistries are normal except for glucose which is a little high, but HgA1c shows no diabetes.    Thyroid looks normal.  ______

## 2014-10-09 ENCOUNTER — Telehealth (INDEPENDENT_AMBULATORY_CARE_PROVIDER_SITE_OTHER): Payer: Self-pay

## 2014-10-09 ENCOUNTER — Ambulatory Visit (INDEPENDENT_AMBULATORY_CARE_PROVIDER_SITE_OTHER): Payer: Medicare Other | Admitting: Cardiology

## 2014-10-09 ENCOUNTER — Encounter (INDEPENDENT_AMBULATORY_CARE_PROVIDER_SITE_OTHER): Payer: Self-pay | Admitting: Cardiology

## 2014-10-09 VITALS — BP 122/80 | HR 69 | Resp 16 | Ht 73.0 in | Wt 192.0 lb

## 2014-10-09 DIAGNOSIS — I482 Chronic atrial fibrillation, unspecified: Secondary | ICD-10-CM

## 2014-10-09 DIAGNOSIS — Z950 Presence of cardiac pacemaker: Secondary | ICD-10-CM

## 2014-10-09 DIAGNOSIS — I2510 Atherosclerotic heart disease of native coronary artery without angina pectoris: Secondary | ICD-10-CM

## 2014-10-09 NOTE — Telephone Encounter (Signed)
-----   Message from Mackey Birchwood, MD sent at 10/08/2014  1:27 PM EST -----  Call pt about results. INR is low.  I know he gets a little nervous about having it too high due to the hemoptysis he has.  I will share these results with Dr. Franchot Erichsen who has been managing his anticoagulation.    Blood count is fine.    Blood chemistries are normal except for glucose which is a little high, but HgA1c shows no diabetes.    Thyroid looks normal.

## 2014-10-09 NOTE — Telephone Encounter (Signed)
L/M on pt's voice mail call me in the office.

## 2014-10-09 NOTE — Telephone Encounter (Signed)
L/M to call back Re: lab results.

## 2014-10-09 NOTE — Progress Notes (Signed)
[facility] CARDIOLOGY PROGRESS NOTE    I had the pleasure of seeing Jonathan [name] today for cardiovascular follow up. He is a pleasant 64 y.o. male with a history of chronic atrial fibrillation and pacemaker in place (leadless device placed) with CAD with cath 03/2014 showing 40- 50% lad and 70% D1 stenosis with EF 50% and no segmental wall motion abnormality at cath who presents for follow up.   The patient maintains fatigue, maybe more since his pacemaker and no chest pain. DOE with a flight of stairs.    Pt has a hx of hemoptysis and bronchiectasis.       MEDICATIONS:    Current outpatient prescriptions:   .  aspirin EC 81 MG EC tablet, Take 81 mg by mouth daily., Disp: , Rfl:   .  Fluticasone Propionate (FLONASE NA), 1 puff by Nasal route as needed., Disp: , Rfl:   .  losartan (COZAAR) 50 MG tablet, Take 1 tablet (50 mg total) by mouth daily., Disp: 90 tablet, Rfl: 3  .  warfarin (COUMADIN) 10 MG tablet, Take 10 mg by mouth daily. Adjusted for inr , Disp: , Rfl:        REVIEW OF SYSTEMS: All other systems reviewed and negative except as above.    PHYSICAL EXAMINATION  General Appearance: well-appearing and in no acute distress.   Vital Signs: BP 122/80 mmHg  Pulse 69  Resp 16  Ht 1.854 m (6\' 1" )  Wt 87.091 kg (192 lb)  BMI 25.34 kg/m2     Chest: Clear to auscultation bilaterally with good air movement and respiratory effort and no wheezes, rales, or rhonchi  Cardiovascular: Normal S1 and  S2 without murmurs, gallops or rub. PMI of normal size and nondisplaced.     Extremities: Warm without edema.     ECG: afib v pace I have personally reviewed and interpreted the EKG/Rhythm.     Past Medical History   Diagnosis Date   . Hypertension    . Sick sinus syndrome    . Coronary artery disease 01/2014     mild inf ischemia   . Bronchiectasis    . Atrial fibrillation 7/14 dx     holter7/14 rates 30-111, 05/2014 27-146, Avg 41  no pauses >2.5 sec   . Atrial fibrillation      Holter 10/15 rates in 30s mostly 7 pm to 7 am, >  100 w exercise only   . Dyspnea on exertion    . Cardiac arrhythmia    . Hyperlipidemia      pt. denies pain   . Pacemaker    . TIA (transient ischemic attack)    . Migraine equivalent    . Neck pain      numbness in hands   . Back pain      numbness in feet   . Cold hands and feet    . Bilirubinemia    . Anxiety    . History of basal cell cancer    . History of basal cell cancer      Family History   Problem Relation Age of Onset   . Hypertension Mother    . Aplastic anemia Mother    . Stroke Father    . Diabetes Father    . Heart disease Father      pacemaker,CAD   . COPD Father    . Leukemia Sister      cml   . Depression Sister    . Anxiety disorder Sister    .  Obesity Daughter    . Heart attack Paternal Uncle    . Heart attack Paternal Grandfather    . Depression Sister    . Anxiety disorder Sister    . Sleep apnea Brother    . Cancer Brother      basal cell   . Heart attack Paternal Aunt      History     Social History   . Marital Status: Married     Spouse Name: N/A     Number of Children: N/A   . Years of Education: N/A     Social History Main Topics   . Smoking status: Former Smoker -- 1.00 packs/day for 10 years     Quit date: 03/28/1984   . Smokeless tobacco: Never Used   . Alcohol Use: No   . Drug Use: No   . Sexual Activity:     Partners: Female     Other Topics Concern   . None     Social History Narrative         IMPRESSION:  Atrial Fibrillation, chronic.  New VVI leadless pacemaker in place.   Bronchiectasis.  CAD  Hyperlipidemia       PLAN:   Continue current medication  I recommend statin treatment of hyperlipidemia, patient declines.   3 months cardiac follow up.       Royann Shivers, MD   10/09/2014

## 2014-10-10 ENCOUNTER — Telehealth (INDEPENDENT_AMBULATORY_CARE_PROVIDER_SITE_OTHER): Payer: Self-pay

## 2014-10-10 ENCOUNTER — Encounter (INDEPENDENT_AMBULATORY_CARE_PROVIDER_SITE_OTHER): Payer: Self-pay | Admitting: Cardiology

## 2014-10-10 NOTE — Telephone Encounter (Signed)
Spoke with patient and  gave them their lab results.

## 2014-10-10 NOTE — Telephone Encounter (Signed)
-----   Message from Mackey Birchwood, MD sent at 10/08/2014  1:27 PM EST -----  Call pt about results. INR is low.  I know he gets a little nervous about having it too high due to the hemoptysis he has.  I will share these results with Dr. Franchot Erichsen who has been managing his anticoagulation.    Blood count is fine.    Blood chemistries are normal except for glucose which is a little high, but HgA1c shows no diabetes.    Thyroid looks normal.

## 2014-12-19 ENCOUNTER — Encounter (INDEPENDENT_AMBULATORY_CARE_PROVIDER_SITE_OTHER): Payer: Medicare Other | Admitting: Physician Assistant

## 2014-12-31 ENCOUNTER — Encounter (INDEPENDENT_AMBULATORY_CARE_PROVIDER_SITE_OTHER): Payer: Medicare Other | Admitting: Clinical Cardiac Electrophysiology

## 2015-01-05 ENCOUNTER — Encounter (INDEPENDENT_AMBULATORY_CARE_PROVIDER_SITE_OTHER): Payer: Self-pay | Admitting: Clinical Cardiac Electrophysiology

## 2015-01-05 ENCOUNTER — Ambulatory Visit (INDEPENDENT_AMBULATORY_CARE_PROVIDER_SITE_OTHER): Payer: Medicare Other | Admitting: Clinical Cardiac Electrophysiology

## 2015-01-05 ENCOUNTER — Encounter (FREE_STANDING_LABORATORY_FACILITY): Payer: Medicare Other

## 2015-01-05 VITALS — BP 126/80 | Ht 73.0 in | Wt 197.0 lb

## 2015-01-05 DIAGNOSIS — I10 Essential (primary) hypertension: Secondary | ICD-10-CM

## 2015-01-05 DIAGNOSIS — Z950 Presence of cardiac pacemaker: Secondary | ICD-10-CM

## 2015-01-05 DIAGNOSIS — Z9229 Personal history of other drug therapy: Secondary | ICD-10-CM

## 2015-01-05 DIAGNOSIS — R05 Cough: Secondary | ICD-10-CM

## 2015-01-05 DIAGNOSIS — Z029 Encounter for administrative examinations, unspecified: Secondary | ICD-10-CM

## 2015-01-05 DIAGNOSIS — Z7901 Long term (current) use of anticoagulants: Secondary | ICD-10-CM

## 2015-01-05 DIAGNOSIS — I482 Chronic atrial fibrillation, unspecified: Secondary | ICD-10-CM

## 2015-01-05 NOTE — Progress Notes (Signed)
Have you sought care outside of [place] since we last saw you?   No

## 2015-01-05 NOTE — Progress Notes (Signed)
[facility] OFFICE VISIT    I had the pleasure of seeing [name] today in cardiac electrophysiology follow up for routine pacemaker check and office visit.     He is a 64 yo male with chronic atrial fibrillation and slow ventricular response who underwent Nanostim leadless pacemaker implantation on 06/20/14. He has a history of a TIA several years ago and has a CHADS VASc of 5. He  not noted much of an improvement in energy with exercise.    He feels more easily fatigued and short of breath with activity than he was  previously.  His principal exercise is swimming.    PMH:   Patient Active Problem List    Diagnosis Date Noted   . Hypertension    . Sick sinus syndrome    . Bronchiectasis    . Atrial fibrillation    . Dyspnea on exertion    . Hyperlipidemia    . Pacemaker    . TIA (transient ischemic attack)    . Migraine equivalent    . Neck pain    . Back pain    . Cold hands and feet    . Bilirubinemia    . Anxiety    . History of basal cell cancer    . Cardiac pacemaker in situ--leadless nano stim 07/01/2014   . Personal history of long-term (current) use of anticoagulants 06/12/2014   . Coronary artery disease involving native coronary artery of native heart without angina pectoris 04/17/2014   . Chest pain, unspecified chest pain type    . Sinus node dysfunction 08/23/2013   . Essential hypertension 08/23/2013        MEDICATIONS: He has a current medication list which includes the following prescription(s): aspirin ec - Take 81 mg by mouth daily, fluticasone propionate - 1 puff by Nasal route as needed, losartan - Take 1 tablet (50 mg total) by mouth daily, and warfarin - Take 10 mg by mouth daily. Adjusted for inr  .    Current Outpatient Prescriptions   Medication Sig Dispense Refill   . aspirin EC 81 MG EC tablet Take 81 mg by mouth daily.     . Fluticasone Propionate (FLONASE NA) 1 puff by Nasal route as needed.     Marland Kitchen losartan (COZAAR) 50 MG tablet Take 1 tablet (50 mg total) by mouth daily. 90 tablet  3   . warfarin (COUMADIN) 10 MG tablet Take 10 mg by mouth daily. Adjusted for inr         No current facility-administered medications for this visit.        Meds reviewed, no changes since last visit.    SH:   History   Substance Use Topics   . Smoking status: Former Smoker -- 1.00 packs/day for 10 years     Quit date: 03/28/1984   . Smokeless tobacco: Never Used   . Alcohol Use: No       FH: no history of sudden death    REVIEW OF SYSTEMS: All other systems reviewed and negative except as above.    PHYSICAL EXAMINATION  General Appearance: A well-appearing male in no acute distress.   Vital Signs: There were no vitals taken for this visit.   HEENT: Sclera anicteric, conjunctiva without pallor, moist mucous membranes, normal dentition.   Neck: Supple without jugular venous distention. Thyroid nonpalpable. Normal carotid upstrokes without bruits.  Chest: Clear to auscultation bilaterally with good air movement and respiratory effort and no wheezes, rales, or rhonchi  Cardiovascular: Normal S1 and physiologically split S2 without murmurs, gallops or rub. PMI of normal size and nondisplaced.   Abdomen: Soft, nontender. No organomegaly.  No pulsatile masses or bruits.    Extremities: Warm without edema. All peripheral pulses are full and equal.  Chest Wall:  Pacemaker site well healed without evidence of hematoma or infection.  Skin: No rash, xanthoma or xanthelasma.   Neuro: Alert and oriented x3. Grossly intact. Strength is symmetrical. Normal mood and affect.     Pacemaker interrogation/reprogramming:   Normal St. Jude single chamber pacemaker function with normal sensing/threshold/impedances.  No significant arrhythmias, battery good.  Threshold is outstanding at 0.25 volts.  R-wave is greater than 9.  I have  taken the liberty of increasing the sensitivity of the sensor to activity  because his rate histogram shows that he is very little of the time over 90  beats per minute.  I also wonder whether the sensor is  less effective in  someone who is swimming, because it works on a temperature change.    IMPRESSION/RECOMMENDATIONS: Mr. Diel is a 64 y.o. male who presents for follow up. Pacemaker with normal function, patient clinically stable.      -Continue with current pacemaker settings  -Office check in 6 months  -Echo cardiogram with Dr. Franchot Erichsen to assess EF with predominantly RV pacing.   [facility]

## 2015-01-08 ENCOUNTER — Ambulatory Visit (INDEPENDENT_AMBULATORY_CARE_PROVIDER_SITE_OTHER): Payer: Medicare Other | Admitting: Cardiology

## 2015-01-08 VITALS — BP 126/80 | Ht 73.0 in | Wt 197.0 lb

## 2015-01-08 DIAGNOSIS — R5383 Other fatigue: Secondary | ICD-10-CM

## 2015-01-08 DIAGNOSIS — Z950 Presence of cardiac pacemaker: Secondary | ICD-10-CM

## 2015-01-08 DIAGNOSIS — I10 Essential (primary) hypertension: Secondary | ICD-10-CM

## 2015-01-08 DIAGNOSIS — I482 Chronic atrial fibrillation, unspecified: Secondary | ICD-10-CM

## 2015-01-08 NOTE — Procedures (Signed)
TRANSTHORACIC ECHOCARDIOGRAM REPORT    [facility] Cardiology - [facility]  Tel [phone]      PATIENT:  Jonathan [name]         MRN: [id number].  Gender: male.  DOB: 08-Feb-1951.  Age: 64 y.o.    Date of study:  01/08/2015    Ordering Physician:  DR Albertine Grates.  Primary Physician:  Alease Medina, MD  Primary Cardiologist:  none    INDICATION:    1. Other fatigue    2. Chronic atrial fibrillation    3. Essential hypertension    4. Pacemaker         PROCEDURE: Transthoracic echocardiography was performed using standard  2-dimensional views, M-mode, and color and spectral Doppler.  Sonographer is [employer].  This study meets appropriate use criteria.      Vitals: BP 126/80 mmHg, height 6 ft 1 in, weight 197 lbs.  Quality of the study is Fair. (poor acoustic windows & obesity).    DIMENSIONS:    4.2 cm -- Aortic root   4.4 cm -- Left atrium   5.1 cm -- Left ventricular diastolic diameter   3.5 cm -- Left ventricular systolic diameter   2.4 cm -- Right ventricular diameter  4.4 cm -- Right atrium  1.0 cm -- Interventricular septum  1.0 cm -- Posterior wall       VELOCITIES and PRESSURES:    0.6 m/sec -- Mitral valve   1.2 m/sec -- Aortic valve   1.0 m/sec -- Pulmonic valve   0.8 m/sec -- Tricuspid valve   0.7 m/sec -- LV outflow tract     2.2 m/sec -- Tricuspid regurgitation velocity  10 mmHg -- Estimated RA pressure  29 mmHg -- RV systolic pressure     FINDINGS:    Left ventricle:  Normal size. Normal wall thickness.. Ejection fraction is estimated in the range of 60 - 65%. There are no regional wall motion abnormalities. Diastolic function could not be assessed.     Right ventricle:  Normal in size and function., pacing wire noted     Left atrium:  Mild to moderately dilated.     Atrial septum:  No defect or patent foramen ovale identified.     Right atrium:  Mild to moderately dilated.     Mitral valve:  Normal valve morphology. No stenosis. Mild regurgitation.     Aortic valve:  Normal valve morphology.  Trileaflet. No stenosis. No regurgitation.     Tricuspid valve:  Normal valve morphology. No stenosis. Trace (physiologic) regurgitation.     Pulmonic valve:  Not well visualized. No stenosis. No regurgitation.     Pulmonary artery:  Pulmonary artery systolic pressure is normal.     Aorta:  The aortic root is mildly dilated. The maximal dimension is 4.2 cm at the sinuses and ascending aorta. The aortic arch is normal at 3.0 cm.    Systemic veins:  The inferior vena cava is normal size with normal respirophasic variation.     Pericardium/ Pleura:  No pericardial effusion is seen.     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       Interpreted and electronically signed by:  Denyse Dago MD  [facility] Cardiology, [facility]  [place] [place] [facility] [place] [facility] [place] - [place]

## 2015-01-15 ENCOUNTER — Ambulatory Visit (INDEPENDENT_AMBULATORY_CARE_PROVIDER_SITE_OTHER): Payer: Medicare Other | Admitting: Cardiology

## 2015-01-15 ENCOUNTER — Encounter (INDEPENDENT_AMBULATORY_CARE_PROVIDER_SITE_OTHER): Payer: Self-pay | Admitting: Cardiology

## 2015-01-15 VITALS — BP 126/80 | HR 71 | Resp 18 | Ht 73.0 in | Wt 193.0 lb

## 2015-01-15 DIAGNOSIS — I481 Persistent atrial fibrillation: Secondary | ICD-10-CM

## 2015-01-15 DIAGNOSIS — I495 Sick sinus syndrome: Secondary | ICD-10-CM

## 2015-01-15 DIAGNOSIS — J479 Bronchiectasis, uncomplicated: Secondary | ICD-10-CM

## 2015-01-15 DIAGNOSIS — I482 Chronic atrial fibrillation, unspecified: Secondary | ICD-10-CM

## 2015-01-15 DIAGNOSIS — I10 Essential (primary) hypertension: Secondary | ICD-10-CM

## 2015-01-15 DIAGNOSIS — Z950 Presence of cardiac pacemaker: Secondary | ICD-10-CM

## 2015-01-15 DIAGNOSIS — I4819 Other persistent atrial fibrillation: Secondary | ICD-10-CM

## 2015-01-15 DIAGNOSIS — G458 Other transient cerebral ischemic attacks and related syndromes: Secondary | ICD-10-CM

## 2015-01-15 MED ORDER — LOSARTAN POTASSIUM 50 MG PO TABS
50.0000 mg | ORAL_TABLET | Freq: Every day | ORAL | Status: DC
Start: 2015-01-15 — End: 2015-02-23

## 2015-01-15 MED ORDER — WARFARIN SODIUM 10 MG PO TABS
10.0000 mg | ORAL_TABLET | Freq: Every day | ORAL | Status: DC
Start: 2015-01-15 — End: 2016-01-14

## 2015-01-15 NOTE — Progress Notes (Signed)
Jonathan [name] CARDIOLOGY PROGRESS NOTE    I had the pleasure of seeing Jonathan [name] today for cardiovascular follow up. He is a pleasant 64 y.o. 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 The patient has some fatigue with exertion and recently had his pacemaker rate responsiveness adjusted.          MEDICATIONS:    Current outpatient prescriptions:   .  albuterol-ipratropium (COMBIVENT RESPIMAT) 20-100 MCG/ACT Aero Soln, Inhale 1 puff into the lungs 4 (four) times daily., Disp: , Rfl:   .  aspirin EC 81 MG EC tablet, Take 81 mg by mouth daily., Disp: , Rfl:   .  azelastine (ASTELIN) 0.1 % nasal spray, , Disp: , Rfl:   .  Fluticasone Propionate (FLONASE NA), 1 puff by Nasal route as needed., Disp: , Rfl:   .  losartan (COZAAR) 50 MG tablet, Take 1 tablet (50 mg total) by mouth daily., Disp: 90 tablet, Rfl: 3  .  warfarin (COUMADIN) 10 MG tablet, Take 10 mg by mouth daily. Adjusted for inr , Disp: , Rfl:        REVIEW OF SYSTEMS: All other systems reviewed and negative except as above.    PHYSICAL EXAMINATION  General Appearance: well-appearing and in no acute distress.   Vital Signs: BP 126/80 mmHg  Pulse 71  Resp 18  Ht 1.854 m (6\' 1" )  Wt 87.544 kg (193 lb)  BMI 25.47 kg/m2     Chest: Clear to auscultation bilaterally with good air movement and respiratory effort and no wheezes, rales, or rhonchi  Cardiovascular: Normal S1 and  S2 without murmurs, gallops or rub. PMI of normal size and nondisplaced.     Extremities: Warm without edema  Past Medical History   Diagnosis Date   . Hypertension    . Sick sinus syndrome    . Coronary artery disease 01/2014     mild inf ischemia   . Bronchiectasis    . Dyspnea on exertion    . Cardiac arrhythmia    . Hyperlipidemia      pt. denies pain   . Pacemaker    . TIA  (transient ischemic attack)    . Migraine equivalent    . Neck pain      numbness in hands   . Back pain      numbness in feet   . Cold hands and feet    . Bilirubinemia    . Anxiety    . History of basal cell cancer    . History of basal cell cancer    . Atrial fibrillation 7/14 dx     holter7/14 rates 30-111, 05/2014 27-146, Avg 41  no pauses >2.5 sec   . Atrial fibrillation      Holter 10/15 rates in 30s mostly 7 pm to 7 am, > 100 w exercise only   . Atrial fibrillation      Chronic, EP feels he is not likely to maintain nsr w/any form of rx     Family History   Problem Relation Age of Onset   . Hypertension Mother    . Aplastic anemia Mother    . Stroke Father    . Diabetes Father    . Heart disease Father  pacemaker,CAD   . COPD Father    . Leukemia Sister      cml   . Depression Sister    . Anxiety disorder Sister    . Obesity Daughter    . Heart attack Paternal Uncle    . Heart attack Paternal Grandfather    . Depression Sister    . Anxiety disorder Sister    . Sleep apnea Brother    . Cancer Brother      basal cell   . Heart attack Paternal Aunt      History     Social History   . Marital Status: Married     Spouse Name: N/A   . Number of Children: N/A   . Years of Education: N/A     Social History Main Topics   . Smoking status: Former Smoker -- 1.00 packs/day for 10 years     Quit date: 03/28/1984   . Smokeless tobacco: Never Used   . Alcohol Use: No   . Drug Use: No   . Sexual Activity:     Partners: Female     Other Topics Concern   . None     Social History Narrative         IMPRESSION:  Chronic atrial fibrillation  Mild CAD clinically stable as outlined above.   Pacemaker in place  Mild ectasia of ascending aorta  BP 100-110 often at home  Occasional hemoptysis followed by Dr Genene Churn      PLAN:   Losartan decrease recommended, patient wants to keep it the same.  Patient has not had an inr for a few months and says he is taking care of it himself. I strongly recommended he go through [place] for INR  checks.   6 mos follow up.       Royann Shivers, MD   01/15/2015

## 2015-01-15 NOTE — Patient Instructions (Signed)
OBTAIN AN INR VALUE IMMEDIATELY AND CALL us TO DISCUSS THE RESULTS

## 2015-01-19 ENCOUNTER — Inpatient Hospital Stay
Admission: RE | Admit: 2015-01-19 | Discharge: 2015-01-19 | Disposition: A | Discharge status: Home / Self Care | Payer: Medicare Other | Source: Ambulatory Visit | Attending: Cardiology | Admitting: Cardiology

## 2015-01-19 DIAGNOSIS — I482 Chronic atrial fibrillation: Secondary | ICD-10-CM | POA: Insufficient documentation

## 2015-01-19 DIAGNOSIS — Z7901 Long term (current) use of anticoagulants: Secondary | ICD-10-CM | POA: Insufficient documentation

## 2015-01-19 DIAGNOSIS — Z5181 Encounter for therapeutic drug level monitoring: Secondary | ICD-10-CM | POA: Insufficient documentation

## 2015-01-19 LAB — PT/INR
PT INR: 2 — ABNORMAL HIGH (ref 0.9–1.1)
PT: 22.4 s — ABNORMAL HIGH (ref 12.6–15.0)

## 2015-01-28 ENCOUNTER — Ambulatory Visit (INDEPENDENT_AMBULATORY_CARE_PROVIDER_SITE_OTHER): Payer: Self-pay

## 2015-01-28 NOTE — Progress Notes (Signed)
Diagnosis: Atrial Fibrillation  INR range:  2.0-3.0  Primary cardiologist:  Denyse Dago, MD  Attention:  patient wants to be called for out of range only  ______________________________    INR = 2.0, date: Jun 20 , day: Monday  Laboratory:  [place]    Comment:  No comment.      New dose?  No    Glynis Smiles Tue Wed Thu Fri Sat   10 10 10 10 10 10 10      Next INR:  1 week(s)    Billing:    _______________________________

## 2015-02-23 ENCOUNTER — Other Ambulatory Visit (INDEPENDENT_AMBULATORY_CARE_PROVIDER_SITE_OTHER): Payer: Self-pay | Admitting: Cardiology

## 2015-02-23 DIAGNOSIS — I159 Secondary hypertension, unspecified: Secondary | ICD-10-CM

## 2015-03-03 ENCOUNTER — Ambulatory Visit (INDEPENDENT_AMBULATORY_CARE_PROVIDER_SITE_OTHER): Payer: Medicare Other | Admitting: Family Medicine

## 2015-04-24 ENCOUNTER — Telehealth (INDEPENDENT_AMBULATORY_CARE_PROVIDER_SITE_OTHER): Payer: Self-pay

## 2015-04-24 NOTE — Telephone Encounter (Signed)
Returned patient call.

## 2015-05-06 ENCOUNTER — Encounter (INDEPENDENT_AMBULATORY_CARE_PROVIDER_SITE_OTHER): Payer: Self-pay | Admitting: Family Medicine

## 2015-05-06 ENCOUNTER — Ambulatory Visit (INDEPENDENT_AMBULATORY_CARE_PROVIDER_SITE_OTHER): Payer: Medicare Other | Admitting: Family Medicine

## 2015-05-06 ENCOUNTER — Telehealth (INDEPENDENT_AMBULATORY_CARE_PROVIDER_SITE_OTHER): Payer: Self-pay | Admitting: Family Medicine

## 2015-05-06 VITALS — BP 156/95 | HR 82 | Ht 73.0 in | Wt 199.0 lb

## 2015-05-06 DIAGNOSIS — N529 Male erectile dysfunction, unspecified: Secondary | ICD-10-CM

## 2015-05-06 DIAGNOSIS — M545 Low back pain, unspecified: Secondary | ICD-10-CM

## 2015-05-06 DIAGNOSIS — G8929 Other chronic pain: Secondary | ICD-10-CM

## 2015-05-06 DIAGNOSIS — I10 Essential (primary) hypertension: Secondary | ICD-10-CM

## 2015-05-06 MED ORDER — METHYLPREDNISOLONE 4 MG PO TBPK
4.0000 mg | ORAL_TABLET | Freq: Every day | ORAL | Status: DC
Start: 2015-05-06 — End: 2015-05-06

## 2015-05-06 MED ORDER — SILDENAFIL CITRATE 20 MG PO TABS
100.0000 mg | ORAL_TABLET | Freq: Every day | ORAL | Status: DC | PRN
Start: 2015-05-06 — End: 2015-05-06

## 2015-05-06 MED ORDER — SILDENAFIL CITRATE 20 MG PO TABS
100.0000 mg | ORAL_TABLET | Freq: Every day | ORAL | Status: DC | PRN
Start: 2015-05-06 — End: 2015-06-19

## 2015-05-06 MED ORDER — METHYLPREDNISOLONE 4 MG PO TBPK
4.0000 mg | ORAL_TABLET | Freq: Every day | ORAL | Status: DC
Start: 2015-05-06 — End: 2015-06-19

## 2015-05-06 NOTE — Progress Notes (Signed)
Subjective:       Patient ID: Jonathan [name] is a 64 y.o. male.    HPI  Patient of Dr. Everardo Beals that presents for follow up of back pain. Has h/o chronic back pain on no prescription medications. He'll be heading out on a month long international trip (including visiting [place]). In anticipation of this month of travel he'd like a save-in-case prescription for oral corticosteroid in the event his back pain was to flare. He's had this medication in the past and tolerated it without problems.     He'd also like a script for Viagra. He's been prescribed this in the past (taking between 50 and 100mg  mg) for a h/o ED. He reports difficulty achieving an erection, difficulty maintaining "tumescent" for penetration.     The following portions of the patient's history were reviewed and updated as appropriate: allergies, current medications and problem list.    Review of Systems   Constitutional: Negative for fever and activity change.   Respiratory: Negative for cough and shortness of breath.    Cardiovascular: Negative for chest pain and palpitations.   Genitourinary: Negative for discharge, penile swelling, scrotal swelling, genital sores and testicular pain.        Positive ED   Musculoskeletal: Positive for back pain (chronic ).   Neurological: Negative for weakness.        Negative: incontinence of bowel or bladder, saddle anesthesia           Objective:    Physical Exam   Constitutional: He is oriented to person, place, and time. He appears well-developed and well-nourished. No distress.   Cardiovascular: Normal rate and normal heart sounds.  An irregularly irregular rhythm present.   Pulmonary/Chest: Effort normal and breath sounds normal. No respiratory distress.   Musculoskeletal:        Lumbar back: He exhibits pain. He exhibits normal range of motion, no tenderness, no bony tenderness and no spasm.   Negative bilateral straight leg raise. Although raising his left leg >45 degrees does cause localized pain in his  lower back   Neurological: He is alert and oriented to person, place, and time. He exhibits normal muscle tone. Coordination and gait normal.   BLE strength 5/5           Assessment:     1. Chronic bilateral low back pain without sciatica  - methylPREDNISolone (MEDROL DOSPACK) 4 MG tablet; Take 1 tablet (4 mg total) by mouth daily. follow package directions  Dispense: 21 tablet; Refill: 0    2. Erectile dysfunction, unspecified erectile dysfunction type  - sildenafil (REVATIO) 20 MG tablet; Take 5 tablets (100 mg total) by mouth daily as needed.  Dispense: 60 tablet; Refill: 0    3. Essential hypertension        Plan:       Chronic. OTC aceteaminophen prn pain. Save-in-case script for medrol dose pack to be used in the event he was to develop acute back pain while traveling abroad given    Chronic. Recurrent. After discussion, patient is agreeable to script for 20mg  tablets and he can take between 60-100mg  prn erectile dysfunction. GoodRx coupon for sildenafil provided to patient.     Chronic, a little above goal today, however previously documented and home BP readings are usually well controlled in the 110s-120s/70s-80s. Continue on current regimen. F/u with cardiology as previously scheduled.     Risks and benefits of the medications prescribed and the plan of care were discussed with patient  and he voiced his understanding and agreement. All questions were answered to his satisfaction.    Procedures

## 2015-05-06 NOTE — Telephone Encounter (Signed)
FYI - Patient called from pharmacy regarding rx. Informed him system just came up and to give some time for refill.

## 2015-05-07 ENCOUNTER — Encounter (INDEPENDENT_AMBULATORY_CARE_PROVIDER_SITE_OTHER): Payer: Self-pay | Admitting: Family Medicine

## 2015-05-07 NOTE — Telephone Encounter (Signed)
Script for medrol dose pack was resent to patient's [facility] pharmacy after the system came back online, although original prescription showed confirmation of receipt the first time written.

## 2015-05-08 ENCOUNTER — Ambulatory Visit
Admission: RE | Admit: 2015-05-08 | Discharge: 2015-05-08 | Disposition: A | Discharge status: Home / Self Care | Payer: Medicare Other | Source: Ambulatory Visit | Attending: Cardiology | Admitting: Cardiology

## 2015-05-08 DIAGNOSIS — I482 Chronic atrial fibrillation: Secondary | ICD-10-CM | POA: Insufficient documentation

## 2015-05-08 LAB — PT/INR
PT INR: 1.8 — ABNORMAL HIGH (ref 0.9–1.1)
PT: 21 s — ABNORMAL HIGH (ref 12.6–15.0)

## 2015-06-19 ENCOUNTER — Ambulatory Visit (INDEPENDENT_AMBULATORY_CARE_PROVIDER_SITE_OTHER): Payer: Medicare Other | Admitting: Physician Assistant

## 2015-06-19 ENCOUNTER — Encounter (INDEPENDENT_AMBULATORY_CARE_PROVIDER_SITE_OTHER): Payer: Self-pay | Admitting: Physician Assistant

## 2015-06-19 VITALS — BP 145/86 | HR 65 | Ht 73.0 in | Wt 205.0 lb

## 2015-06-19 DIAGNOSIS — I482 Chronic atrial fibrillation, unspecified: Secondary | ICD-10-CM

## 2015-06-19 DIAGNOSIS — I495 Sick sinus syndrome: Secondary | ICD-10-CM

## 2015-06-19 DIAGNOSIS — Z950 Presence of cardiac pacemaker: Secondary | ICD-10-CM

## 2015-06-19 NOTE — Progress Notes (Signed)
[facility] OFFICE VISIT    Jonathan [name] presents to the office today for leadless pacemaker site check.   He is a 64 yo male with chronic atrial fibrillation and slow ventricular response who underwent nanostim leadless pacemaker implantation on 06/20/14.  He has a history of a TIA several years ago and has a CHADS VASc of 5. He has been feeling well since his last visit, but again has not noted much of an improvement in energy with exercise.  He has had similar complaints since device implant.  His last office visit with Jonathan [name], his rate responsiveness was made more sensitive.  He has not noted any significant change in his symptoms.  He continues to swim regularly.  Jonathan [name] denies chest pain, pressure, palpitations, SOB, dizziness, lightheadedness, pre syncope or syncope. He follows with Jonathan [name].        PMH:   Past Medical History   Diagnosis Date   . Hypertension    . Sick sinus syndrome    . Coronary artery disease 01/2014     mild inf ischemia   . Bronchiectasis    . Dyspnea on exertion    . Cardiac arrhythmia    . Hyperlipidemia      pt. denies pain   . Pacemaker    . TIA (transient ischemic attack)    . Migraine equivalent    . Neck pain      numbness in hands   . Back pain      numbness in feet   . Cold hands and feet    . Bilirubinemia    . Anxiety    . History of basal cell cancer    . History of basal cell cancer    . Atrial fibrillation 7/14 dx     holter7/14 rates 30-111, 05/2014 27-146, Avg 41  no pauses >2.5 sec. Holter 10/15 rates in 30s mostly 7 pm to 7 am, > 100 w exercise only   . 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 aspirin EC 81 MG EC tablet Take 81 mg by mouth daily.     Marland Kitchen azelastine (ASTELIN) 0.1 % nasal spray      . Fluticasone Propionate (FLONASE NA) 1 puff by Nasal route as needed.     Marland Kitchen losartan (COZAAR) 50 MG tablet TAKE 1 TABLET BY MOUTH EVERY DAY 30 tablet 3   . PROAIR  RESPICLICK 108 (90 BASE) MCG/ACT Aerosol Powder, Breath Activtivatede      . warfarin (COUMADIN) 10 MG tablet Take 1 tablet (10 mg total) by mouth daily. Adjusted for inr 100 tablet 3     No current facility-administered medications for this visit.        SH:   Social History   Substance Use Topics   . Smoking status: Former Smoker -- 1.00 packs/day for 10 years     Quit date: 03/28/1984   . Smokeless tobacco: Never Used   . Alcohol Use: No       REVIEW OF SYSTEMS: As above per HPI    PHYSICAL EXAM:  Filed Vitals:    06/19/15 1117   BP: 145/86   Pulse: 65   Height: 1.854 m (6\' 1" )   Weight: 92.987 kg (205 lb)     Gen:  well-developed, well-nourished 64 year old male, alert and oriented and in no acute distress  HEENT:  normocephalic, atraumatic; sclera anicteric  Neck: good range of motion with trachea midline and no JVD appreciated  Chest:  good air entry bilaterally and lungs are clear to auscultation bilaterally  Heart: normal S1, S2; regular rate and rhythm; no significant murmur, rubs or gallops  Extremities: warm without edema         DEVICE INTERROGATION/REPROGRAMMING:    St Jude nano stim set in the VVIR mode with lpr 60 bpm.  92% V paced, heart rates range from 60-90s per histograms.  Impedence 460.  R wave sensed at 8.2mv.  Threshold .5v at .4ms.  Battery >3.3v, 10.8 yrs to ERI.  Max sensor rate increased to 140bpm.  HRs range from 60-130bpm.      IMPRESSION/RECOMMENDATIONS:   1. Chronic atrial fibrillation     2. Cardiac pacemaker in situ--leadless nano stim     3. Sick sinus syndrome         Jonathan [name] is stable from an arrhythmia standpoint at this time.  His upper tracking rate was increased 240 bpm, as he notes heart rates of about 130 bpm during swimming.  D nano battery advisory was discussed with the patient.  As he is not pacemaker dependent and his device was implanted less than 24 months ago, we will continue to monitor via routine research protocol.  The patient will follow up with our office  in 6 mo.      ----------------------------  Jonathan Dredge, PA-C  [facility]  Spectra link [phone]  Office [phone]    Incident to service performed with physician present in the office in accordance with this patient's established plan of care.

## 2015-11-16 ENCOUNTER — Inpatient Hospital Stay
Admission: RE | Admit: 2015-11-16 | Discharge: 2015-11-16 | Disposition: A | Discharge status: Home / Self Care | Payer: Medicare Other | Source: Ambulatory Visit | Attending: Cardiology | Admitting: Cardiology

## 2015-11-16 DIAGNOSIS — I482 Chronic atrial fibrillation: Secondary | ICD-10-CM | POA: Insufficient documentation

## 2015-11-16 LAB — PT/INR
PT INR: 1.8 — ABNORMAL HIGH (ref 0.9–1.1)
PT: 21 s — ABNORMAL HIGH (ref 12.6–15.0)

## 2015-11-23 ENCOUNTER — Ambulatory Visit (INDEPENDENT_AMBULATORY_CARE_PROVIDER_SITE_OTHER): Payer: Medicare Other | Admitting: Family Medicine

## 2015-11-23 VITALS — BP 146/94 | HR 79 | Temp 97.2°F | Ht 73.0 in | Wt 212.2 lb

## 2015-11-23 DIAGNOSIS — I10 Essential (primary) hypertension: Secondary | ICD-10-CM

## 2015-11-23 DIAGNOSIS — F4322 Adjustment disorder with anxiety: Secondary | ICD-10-CM

## 2015-11-23 MED ORDER — SERTRALINE HCL 50 MG PO TABS
ORAL_TABLET | ORAL | Status: DC
Start: 2015-11-23 — End: 2015-12-25

## 2015-11-23 MED ORDER — ALPRAZOLAM 0.5 MG PO TABS
0.5000 mg | ORAL_TABLET | Freq: Two times a day (BID) | ORAL | Status: DC | PRN
Start: 2015-11-23 — End: 2017-11-17

## 2015-11-23 MED ORDER — SERTRALINE HCL 50 MG PO TABS
ORAL_TABLET | ORAL | Status: DC
Start: 2015-11-23 — End: 2015-11-23

## 2015-11-23 NOTE — Progress Notes (Signed)
Subjective:      Patient ID: Jonathan [name] is a 65 y.o. male     Chief Complaint   Patient presents with   . Anxiety     1 month for this current episode not taking any medications pt states it is a combination of job and personal situations        HPI     Presents for evaluation of "feeling a little anxious". 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 He's already paid some business and he's worried he may have to give more back. He reports having had anxiety in the past and that it runs in the family (specifically 2 of his sisters). He's unsure if he wants to start on something at this time, but he thinks just by having the prescriptions, in the event his symptoms become very bad will be helpful, "it's a plan". He was prescribed xanax a few years ago when had increased anxiety due to travel and work related issues, he states he tolerated the medication without problem. He thought it worked well for him in reducing his anxiety symptoms, he took the medication sparingly and denies feeling physiological or psychological dependency.     PMhx significant for HTN, on losartan 50mg . He forgot to take his blood pressure medication this morning.     The following portions of the patient's history were reviewed and updated as appropriate: allergies, current medications and problem list.    Review of Systems   Constitutional: Negative for activity change.   Respiratory: Negative for shortness of breath.    Cardiovascular: Negative for chest pain and palpitations.   Neurological: Negative for headaches.   Psychiatric/Behavioral: Positive for sleep disturbance. Negative for suicidal ideas, self-injury and dysphoric mood. The patient is nervous/anxious.           BP 146/94 mmHg  Pulse 79  Temp(Src) 97.2 F (36.2 C) (Oral)  Ht 1.854 m (6\' 1" )  Wt 96.253 kg (212 lb 3.2 oz)  BMI 28.00 kg/m2     Objective:     Physical Exam   Constitutional: He is oriented to  person, place, and time. He appears well-developed and well-nourished. No distress.   Cardiovascular: Normal rate, regular rhythm and normal heart sounds.    No murmur heard.  Pulmonary/Chest: Effort normal and breath sounds normal. No respiratory distress.   Neurological: He is alert and oriented to person, place, and time. He exhibits normal muscle tone.   Psychiatric: His speech is normal and behavior is normal. Judgment and thought content normal. His mood appears anxious. He expresses no homicidal and no suicidal ideation.          Assessment:     1. Adjustment disorder with anxious mood  - ALPRAZolam (XANAX) 0.5 MG tablet; Take 1 tablet (0.5 mg total) by mouth every 12 (twelve) hours as needed.  Dispense: 30 tablet; Refill: 1  - sertraline (ZOLOFT) 50 MG tablet; 1/2 tab PO daily 1 week, then increase to 1 full tab PO daily  Dispense: 30 tablet; Refill: 0    2. Essential hypertension       Plan:     Acute. Secondary to recent life stressors. He's agreeable to script for both controller medication, zoloft and as needed therapy with xanax. Medication side effects reviewed. Discussed with him risk for psychological and physiological dependence risk of xanax and that his medication is a controlled substance and requires  appointment for refill and adjustment.     Chronic. Uncontrolled and a little above goal of less than 140/80. Instructed patient to take dose of medication upon his return home this afternoon, then resume daily dosing tomorrow morning as per usual.    Risks and benefits of the medications prescribed and the plan of care were discussed with patient and he voiced his understanding and agreement. All questions were answered to his satisfaction.    RTO in 1 month for f/u anxiety.     Emelda Brothers, MD

## 2015-11-24 NOTE — Patient Instructions (Signed)
AnxietyReaction  Anxiety is the feeling we all get when we think something bad might happen. It is a normal response to stress and usually causes only a mild reaction. When anxiety becomes more severe, it caninterfere with daily life. In some cases, you may not even be aware of what it is you're anxious about. There may also be a genetic link or it may be a learned behavior in the home.  Both psychological and physical triggers cause stress reaction. It's often a response to fear or emotional stress, real or imagined. This stress may come from home, family, work, or social relationships.  During an anxiety reaction, you may feel:   Helpless   Nervous   Depressed   Irritable  Your body may show signs of anxiety in many ways. You may experience:   Dry mouth   Shakiness   Dizziness   Weakness   Trouble breathing   Breathing fast (hyperventilating)   Chest pressure   Sweating   Headache   Nausea   Diarrhea   Tiredness   Inability to sleep   Sexual problems  Home care   Try to locate the sources of stress in your life. They may not be obvious. These may include:   Daily hassles of life (traffic jams, missed appointments, car troubles, etc.)   Major life changes, both good (new baby, job promotion) and bad (loss of job, loss of loved one)   Overload: feeling that you have too many responsibilities and can't take care of all of them at once   Feeling helpless, feeling that your problems are beyond what you're able to solve   Notice how your body reacts to stress. Learn to listen to your body signals. This will help you take action before the stress becomes severe.   When you can, do something about the source of your stress. (Avoid hassles, limit the amount of change that happens in your life at one time and take a break when you feel overloaded).   Unfortunately, many stressful situations can't be avoided. It is necessary to learn how to better manage stress. There are many proven methods  that will reduce your anxiety. These include simple things like exercise, good nutrition and adequate rest. Also, there are certain techniques that are helpful:   Relaxation   Breathing exercises   Visualization   Biofeedback   Meditation  For more information about this, consult your doctor or go to a local bookstore and review the many books and tapes available on this subject.  Follow-up care  If you feel that your anxiety is not responding to self-help measures, contact your doctor or make an appointment with a counselor. You may need short-term psychological counseling and temporary medicine to help you manage stress.  Call 911  Call your healthcare provider right away if any of these occur:   Trouble breathing   Confusion   Drowsiness or trouble wakening   Fainting or loss of consciousness   Rapid heart rate   Seizure   New chest pain that becomes more severe, lasts longer, or spreads into your shoulder, arm, neck, jaw, or back  When to seek medical advice  Call your healthcare provider right away if any of these occur:   Your symptoms get worse   Severe headache not relieved by rest and mild pain reliever  Date Last Reviewed: 04/29/2014   2000-2016 [employer] [address], [place] [address]. All rights reserved. This information is not   intended as a substitute for professional medical care. Always follow your healthcare professional's instructions.        Your Body's Response to Anxiety    Normal anxiety is part of the body's natural defense system. It's an alert to a threat that is unknown, vague, or comes from your own internal fears.While you're in this state, your feelings can range from a vague sense of worry to physical sensations such as a pounding heartbeat. These feelings make you want to react to the threat. An anxiety response is normal in many situations. But when you have an anxiety disorder, the same response can occur at the wrong times.  Anxiety can be  helpful  Normal anxiety is a signal from your brain that warns you of a threat and is a normal response to help you prevent something or decrease the bad effects of something you can't control. For example, anxiety is a normal response to situations that might damage your body, separate you from a loved one, or lose your job. The symptoms of anxiety can be physical and mental.  How does it feel?  At certain times, people with anxiety may have:   Dizziness   Muscle tension or pain   Restlessness   Sleeplessness   Difficulty concentrating   Racing heartbeat   Fast breathing   Shaking or trembling   Stomachache   Diarrhea   Loss of energy   Sweating   Cold, clammy hands   Chest pain   Dry mouth  Anxiety can also be a problem  Anxiety can become a problem when it is difficult to control, occurs for months, and interferes with important parts of your life. With an anxiety disorder, your body has the response described above, but in inappropriate ways. The response a person has depends on the anxiety disorder he or she has. With some disorders, the anxiety is way out of proportion to the threat that triggers it. With others, anxiety may occur even when there isn't a clear threat or trigger.  Who does it affect?  Some people are more prone to persistent anxiety than others. It tends to run in families, and it affects more younger people than older people. But no age, race, or gender is immune to anxiety problems.  Anxiety can be treated  The good news is that the anxiety that's disrupting your life can be treated. Working with your doctor or other healthcare provider, you can develop skills to help you cope with anxiety. You can also gain the perspective you need to overcome your fears. Note:Good sources of support or guidance can be found at your local hospital, mental health clinic, or an employee assistance program.    If anxiety is wearing you down, here are some things you can do to cope:   Keep in  mind that you can't control everything about a situation. Change what you can and let the rest take its course.   Exercise--it's a great way to relieve tension and help your body feel relaxed.   Avoid caffeine and nicotine, which can make anxiety symptoms worse.   Fight the temptation to turn to alcohol or unprescribed drugs for relief. They only make things worse in the long run.   Date Last Reviewed: 08/19/2013   2000-2016 [employer] [address], [place] [address]. All rights reserved. This information is not intended as a substitute for professional medical care. Always follow your healthcare professional's instructions.

## 2015-11-26 ENCOUNTER — Encounter (INDEPENDENT_AMBULATORY_CARE_PROVIDER_SITE_OTHER): Payer: Self-pay | Admitting: Family Medicine

## 2015-11-30 ENCOUNTER — Emergency Department: Payer: Medicare Other

## 2015-11-30 ENCOUNTER — Other Ambulatory Visit: Payer: Medicare Other

## 2015-11-30 ENCOUNTER — Emergency Department
Admission: EM | Admit: 2015-11-30 | Discharge: 2015-11-30 | Disposition: A | Discharge status: Home / Self Care | Payer: Medicare Other | Attending: Emergency Medicine | Admitting: Emergency Medicine

## 2015-11-30 DIAGNOSIS — E785 Hyperlipidemia, unspecified: Secondary | ICD-10-CM | POA: Insufficient documentation

## 2015-11-30 DIAGNOSIS — Z7982 Long term (current) use of aspirin: Secondary | ICD-10-CM | POA: Insufficient documentation

## 2015-11-30 DIAGNOSIS — I2510 Atherosclerotic heart disease of native coronary artery without angina pectoris: Secondary | ICD-10-CM | POA: Insufficient documentation

## 2015-11-30 DIAGNOSIS — Z8673 Personal history of transient ischemic attack (TIA), and cerebral infarction without residual deficits: Secondary | ICD-10-CM | POA: Insufficient documentation

## 2015-11-30 DIAGNOSIS — Z7901 Long term (current) use of anticoagulants: Secondary | ICD-10-CM | POA: Insufficient documentation

## 2015-11-30 DIAGNOSIS — I712 Thoracic aortic aneurysm, without rupture: Secondary | ICD-10-CM | POA: Insufficient documentation

## 2015-11-30 DIAGNOSIS — I10 Essential (primary) hypertension: Secondary | ICD-10-CM | POA: Insufficient documentation

## 2015-11-30 DIAGNOSIS — I495 Sick sinus syndrome: Secondary | ICD-10-CM | POA: Insufficient documentation

## 2015-11-30 DIAGNOSIS — R791 Abnormal coagulation profile: Secondary | ICD-10-CM

## 2015-11-30 DIAGNOSIS — R079 Chest pain, unspecified: Secondary | ICD-10-CM | POA: Insufficient documentation

## 2015-11-30 DIAGNOSIS — Z950 Presence of cardiac pacemaker: Secondary | ICD-10-CM | POA: Insufficient documentation

## 2015-11-30 LAB — BASIC METABOLIC PANEL
Anion Gap: 16 — ABNORMAL HIGH (ref 5.0–15.0)
BUN: 18 mg/dL (ref 9–28)
CO2: 21 mEq/L — ABNORMAL LOW (ref 22–29)
Calcium: 10.2 mg/dL (ref 8.5–10.5)
Chloride: 103 mEq/L (ref 100–111)
Creatinine: 1.1 mg/dL (ref 0.7–1.3)
Glucose: 114 mg/dL — ABNORMAL HIGH (ref 70–100)
Potassium: 4.8 mEq/L (ref 3.5–5.1)
Sodium: 140 mEq/L (ref 136–145)

## 2015-11-30 LAB — PT AND APTT
PT INR: 1.1 (ref 0.9–1.1)
PT: 14.5 s (ref 12.6–15.0)
PTT: 25 s (ref 23–37)

## 2015-11-30 LAB — CBC AND DIFFERENTIAL
Basophils Absolute Automated: 0.04 10*3/uL (ref 0.00–0.20)
Basophils Automated: 0 %
Eosinophils Absolute Automated: 0.04 10*3/uL (ref 0.00–0.70)
Eosinophils Automated: 0 %
Hematocrit: 42.2 % (ref 42.0–52.0)
Hgb: 14.6 g/dL (ref 13.0–17.0)
Immature Granulocytes Absolute: 0.01 10*3/uL
Immature Granulocytes: 0 %
Lymphocytes Absolute Automated: 1.4 10*3/uL (ref 0.50–4.40)
Lymphocytes Automated: 16 %
MCH: 30.7 pg (ref 28.0–32.0)
MCHC: 34.6 g/dL (ref 32.0–36.0)
MCV: 88.7 fL (ref 80.0–100.0)
MPV: 10.1 fL (ref 9.4–12.3)
Monocytes Absolute Automated: 0.61 10*3/uL (ref 0.00–1.20)
Monocytes: 7 %
Neutrophils Absolute: 6.77 10*3/uL (ref 1.80–8.10)
Neutrophils: 76 %
Nucleated RBC: 0 /100 WBC (ref 0–1)
Platelets: 229 10*3/uL (ref 140–400)
RBC: 4.76 10*6/uL (ref 4.70–6.00)
RDW: 14 % (ref 12–15)
WBC: 8.86 10*3/uL (ref 3.50–10.80)

## 2015-11-30 LAB — TROPONIN I
Troponin I: 0.02 ng/mL (ref 0.00–0.09)
Troponin I: 0.02 ng/mL (ref 0.00–0.09)

## 2015-11-30 LAB — GFR: EGFR: >60

## 2015-11-30 LAB — GLUCOSE WHOLE BLOOD - POCT: Whole Blood Glucose POCT: 118 mg/dL — ABNORMAL HIGH (ref 70–100)

## 2015-11-30 MED ORDER — IOHEXOL 350 MG/ML IV SOLN
INTRAVENOUS | Status: AC
Start: 2015-11-30 — End: 2015-11-30
  Administered 2015-11-30: 100 mL via INTRAVENOUS
  Filled 2015-11-30: qty 100

## 2015-11-30 MED ORDER — ASPIRIN 81 MG PO CHEW
324.0000 mg | CHEWABLE_TABLET | Freq: Once | ORAL | Status: DC
Start: 2015-11-30 — End: 2015-11-30
  Filled 2015-11-30: qty 4

## 2015-11-30 NOTE — ED Notes (Signed)
VSS. Patient A&Ox4

## 2015-11-30 NOTE — ED Notes (Signed)
Patient presents to the ED for dizziness and chest "tightness". Patient reports,"I felt dizzy and began to sweat like a horse. Then I felt tightness in my chest." Patient was given 162mg  of aspirin on scene by bystanders.

## 2015-11-30 NOTE — ED Provider Notes (Addendum)
EMERGENCY DEPARTMENT NOTE    Physician/Midlevel provider first contact with patient: 11/30/15 1035         HISTORY OF PRESENT ILLNESS   Historian: patient  Translator Used: no    65 y.o. male presents with chest tightness.    1. Location of symptoms: chest tightness  2. Onset of symptoms: 9-10 am today  3. What was patient doing when symptoms started (Context): after swimming, at mass when symptoms occurred, received asa 162 mg by ems  4. Severity: 3/10  5. Timing: acute onset, lasted few minutes  6. Activities that worsen symptoms: none  7. Activities that improve symptoms: none  8. Quality: chest tightness   9. Radiation of symptoms: between shoulder blades  10. Associated signs and Symptoms: sob, sweaty, dizzy   11. Are symptoms worsening? yes    MEDICAL HISTORY     Past Medical History:  Past Medical History   Diagnosis Date   . Hypertension    . Sick sinus syndrome    . Coronary artery disease 01/2014     mild inf ischemia   . Bronchiectasis    . Dyspnea on exertion    . Cardiac arrhythmia    . Hyperlipidemia      pt. denies pain   . Pacemaker    . TIA (transient ischemic attack)    . Migraine equivalent    . Neck pain      numbness in hands   . Back pain      numbness in feet   . Cold hands and feet    . Bilirubinemia    . Anxiety    . History of basal cell cancer    . History of basal cell cancer    . Atrial fibrillation 7/14 dx     holter7/14 rates 30-111, 05/2014 27-146, Avg 41  no pauses >2.5 sec. Holter 10/15 rates in 30s mostly 7 pm to 7 am, > 100 w exercise only   . Thoracic aortic ectasia 4.30 January 2013, 4.2 12/2014       Past Surgical History:  Past Surgical History   Procedure Laterality Date   . Cardiac catheterization  04/2014     50% lad, tight origin of D1   . Cardiac pacemaker placement         Social History:  Social History     Social History   . Marital Status: Married     Spouse Name: N/A   . Number of Children: N/A   . Years of Education: N/A     Occupational History   . Not on file.      Social History Main Topics   . Smoking status: Former Smoker -- 1.00 packs/day for 10 years     Quit date: 03/28/1984   . Smokeless tobacco: Never Used   . Alcohol Use: Yes      Comment: occasionally   . Drug Use: No   . Sexual Activity:     Partners: Female     Other Topics Concern   . Not on file     Social History Narrative       Family History:  Family History   Problem Relation Age of Onset   . Hypertension Mother    . Aplastic anemia Mother    . Stroke Father    . Diabetes Father    . Heart disease Father      pacemaker,CAD   . COPD Father    . Leukemia Sister  cml   . Depression Sister    . Anxiety disorder Sister    . Obesity Daughter    . Heart attack Paternal Uncle    . Heart attack Paternal Grandfather    . Depression Sister    . Anxiety disorder Sister    . Sleep apnea Brother    . Cancer Brother      basal cell   . Heart attack Paternal Aunt        Outpatient Medication:  Discharge Medication List as of 11/30/2015  2:10 PM      CONTINUE these medications which have NOT CHANGED    Details   ALPRAZolam (XANAX) 0.5 MG tablet Take 1 tablet (0.5 mg total) by mouth every 12 (twelve) hours as needed., Starting 11/23/2015, Until Discontinued, Print      aspirin EC 81 MG EC tablet Take 81 mg by mouth daily., Until Discontinued, Historical Med      azelastine (ASTELIN) 0.1 % nasal spray Starting 12/26/2014, Until Discontinued, Historical Med      Fluticasone Propionate (FLONASE NA) 1 puff by Nasal route as needed., Until Discontinued, Historical Med      losartan (COZAAR) 50 MG tablet TAKE 1 TABLET BY MOUTH EVERY DAY, Normal      PROAIR RESPICLICK 108 (90 BASE) MCG/ACT Aerosol Powder, Breath Activtivatede Starting 04/09/2015, Until Discontinued, Historical Med      sertraline (ZOLOFT) 50 MG tablet 1/2 tab PO daily 1 week, then increase to 1 full tab PO daily, Print      warfarin (COUMADIN) 10 MG tablet Take 1 tablet (10 mg total) by mouth daily. Adjusted for inr, Starting 01/15/2015, Until Discontinued, Normal              Allergies:  No Known Allergies      REVIEW OF SYSTEMS   Review of Systems   Respiratory: Positive for shortness of breath.    Cardiovascular: Positive for chest pain.   Musculoskeletal: Positive for back pain.   Neurological: Positive for dizziness. Negative for loss of consciousness.   All other systems reviewed and are negative.        PHYSICAL EXAM     Filed Vitals:    11/30/15 1400   BP: 113/78   Pulse: 66   Temp: 97.9 F (36.6 C)   Resp: 20   SpO2: 97%       Nursing note and vitals reviewed.    Constitutional: non-toxic  Head: Atraumatic.  Eyes: PERRL. EOMI. No scleral icterus.  ENT: Mucous membranes are moist and intact. Oropharynx is clear. Patent airway.  Neck: Supple. No cervical lymphadenopathy.  Cardiovascular: Regular rate. Regular rhythm. No murmurs, rubs, or gallops.  Pulmonary/Chest: No evidence of respiratory distress. Clear to auscultation bilaterally. No wheezing, rales or rhonchi.   GI: Soft, non-distended abdomen. No tenderness to palpation of abdomen.  Extremities: No edema. No deformity.  Skin: No rash.   Neurological: Awake, alert and oriented x 3. CN II-XII intact. Strength intact. Sensation intact.  Psychiatric: Appropriate affect. Appropriate mood. Appropriate behavior.    MEDICAL DECISION MAKING   Diff- ACS, aortic dissection  CTA chest obtained to rule out aortic dissection.  I discussed case with Dr. Loma Newton. Patient does not want to be admitted. Dr. Loma Newton agrees with checking trop 3 hours after initial and d/c home if normal. 2nd trop normal. Reassured patient. Instructed him to follow up with his cardiologist promptly. I also discouraged him from engaging in any strenuous physical activity such as exercise.  I  also discussed patient's normal INR with him. He is supposed to be taking coumadin for hx of afib for stroke ppx. He states he didn't take his coumadin for several days and resumed taking it a few days ago. I instructed him to speak with his cardiologist about this as soon  as possible, like this afternoon.    DISCUSSION      Vital Signs: Reviewed the patient?s vital signs.   Nursing Notes: Reviewed and utilized available nursing notes.  Medical Records Reviewed: Reviewed available past medical records.  Counseling: The emergency provider has spoken with the patient and discussed today?s findings, in addition to providing specific details for the plan of care.  Questions are answered and there is agreement with the plan.    IMAGING STUDIES    The following imaging studies were independently interpreted by the Emergency Medicine Physician.  For full imaging study results please see chart.    CARDIAC STUDIES     The following cardiac studies were independently interpreted by the Emergency Medicine Physician. For full cardiac study results please see chart     EKG Interpretation:   Signed and interpreted by ED Physician   Time Interpreted: 1014  Comparison:   Rate: 68  Rhythm: ventricular paced   Axis:   Intervals:   Blocks:   ST segments: ventricular paced  Interpretation: abnormal EKG      PULSE OXIMETRY    Oxygen Saturation by Pulse Oximetry: 99% RA  Interventions: none  Interpretation: normal    EMERGENCY DEPT. MEDICATIONS      ED Medication Orders     Start Ordered     Status Ordering Provider    11/30/15 1140 11/30/15 1140  iohexol (OMNIPAQUE) 350 MG/ML injection     Comments:  Created by cabinet override    Last MAR action:  Imaging Agent Given     11/30/15 1053 11/30/15 1052     Once,   Status:  Discontinued     Route: Oral  Ordered Dose: 324 mg     Discontinued Bridget Durham WINDSOR          LABORATORY RESULTS    Ordered and independently interpreted AVAILABLE laboratory tests. Please see results section in chart for full details.  Results for orders placed or performed during the hospital encounter of 11/30/15   Basic Metabolic Panel   Result Value Ref Range    Glucose 114 (H) 70 - 100 mg/dL    BUN 18 9 - 28 mg/dL    Creatinine 1.1 0.7 - 1.3 mg/dL    Calcium 16.1 8.5 - 09.6  mg/dL    Sodium 045 409 - 811 mEq/L    Potassium 4.8 3.5 - 5.1 mEq/L    Chloride 103 100 - 111 mEq/L    CO2 21 (L) 22 - 29 mEq/L    Anion Gap 16.0 (H) 5.0 - 15.0   Troponin I   Result Value Ref Range    Troponin I 0.02 0.00 - 0.09 ng/mL   CBC with differential   Result Value Ref Range    WBC 8.86 3.50 - 10.80 x10 3/uL    Hgb 14.6 13.0 - 17.0 g/dL    Hematocrit 91.4 78.2 - 52.0 %    Platelets 229 140 - 400 x10 3/uL    RBC 4.76 4.70 - 6.00 x10 6/uL    MCV 88.7 80.0 - 100.0 fL    MCH 30.7 28.0 - 32.0 pg    MCHC 34.6 32.0 - 36.0 g/dL  RDW 14 12 - 15 %    MPV 10.1 9.4 - 12.3 fL    Neutrophils 76 None %    Lymphocytes Automated 16 None %    Monocytes 7 None %    Eosinophils Automated 0 None %    Basophils Automated 0 None %    Immature Granulocyte 0 None %    Nucleated RBC 0 0 - 1 /100 WBC    Neutrophils Absolute 6.77 1.80 - 8.10 x10 3/uL    Abs Lymph Automated 1.40 0.50 - 4.40 x10 3/uL    Abs Mono Automated 0.61 0.00 - 1.20 x10 3/uL    Abs Eos Automated 0.04 0.00 - 0.70 x10 3/uL    Absolute Baso Automated 0.04 0.00 - 0.20 x10 3/uL    Absolute Immature Granulocyte 0.01 0 x10 3/uL   GFR   Result Value Ref Range    EGFR >60.0    PT/ APTT   Result Value Ref Range    PT 14.5 12.6 - 15.0 sec    PT INR 1.1 0.9 - 1.1    PT Anticoag. Given Within 48 hrs. None     PTT 25 23 - 37 sec   Troponin I   Result Value Ref Range    Troponin I 0.02 0.00 - 0.09 ng/mL   Glucose Whole Blood - POCT   Result Value Ref Range    POCT - Glucose Whole blood 118 (H) 70 - 100 mg/dL   ECG 12 Lead   Result Value Ref Range    Ventricular Rate 68 BPM    Atrial Rate 326 BPM    P-R Interval  ms    QRS Duration 192 ms    Q-T Interval 466 ms    QTC Calculation (Bezet) 495 ms    P Axis  degrees    R Axis -33 degrees    T Axis 84 degrees       CONSULTATIONS        CRITICAL CARE        ATTESTATIONS        Physician Attestation: Darlyn Read MD, have been the primary provider for Jonathan [name] during this Emergency Dept visit and have reviewed the chart  for accuracy and agree with its content.       DIAGNOSIS      Diagnosis:  Final diagnoses:   Chest pain, unspecified type   Subtherapeutic international normalized ratio (INR)       Disposition:  ED Disposition     Discharge Jonathan [name] discharge to home/self care.    Condition at disposition: Stable            Prescriptions:  Discharge Medication List as of 11/30/2015  2:10 PM      CONTINUE these medications which have NOT CHANGED    Details   ALPRAZolam (XANAX) 0.5 MG tablet Take 1 tablet (0.5 mg total) by mouth every 12 (twelve) hours as needed., Starting 11/23/2015, Until Discontinued, Print      aspirin EC 81 MG EC tablet Take 81 mg by mouth daily., Until Discontinued, Historical Med      azelastine (ASTELIN) 0.1 % nasal spray Starting 12/26/2014, Until Discontinued, Historical Med      Fluticasone Propionate (FLONASE NA) 1 puff by Nasal route as needed., Until Discontinued, Historical Med      losartan (COZAAR) 50 MG tablet TAKE 1 TABLET BY MOUTH EVERY DAY, Normal      PROAIR RESPICLICK 108 (90 BASE) MCG/ACT Aerosol  Powder, Breath Activtivatede Starting 04/09/2015, Until Discontinued, Historical Med      sertraline (ZOLOFT) 50 MG tablet 1/2 tab PO daily 1 week, then increase to 1 full tab PO daily, Print      warfarin (COUMADIN) 10 MG tablet Take 1 tablet (10 mg total) by mouth daily. Adjusted for inr, Starting 01/15/2015, Until Discontinued, Normal                 Marland Mcalpine, MD  11/30/15 1601    Marland Mcalpine, MD  11/30/15 1602    Marland Mcalpine, MD  11/30/15 [phone]

## 2015-12-01 LAB — ECG 12-LEAD
Atrial Rate: 326 {beats}/min
Q-T Interval: 466 ms
QRS Duration: 192 ms
QTC Calculation (Bezet): 495 ms
R Axis: -33 degrees
T Axis: 84 degrees
Ventricular Rate: 68 {beats}/min

## 2015-12-09 ENCOUNTER — Inpatient Hospital Stay
Admission: RE | Admit: 2015-12-09 | Discharge: 2015-12-09 | Disposition: A | Discharge status: Home / Self Care | Payer: Medicare Other | Source: Ambulatory Visit | Attending: Cardiology | Admitting: Cardiology

## 2015-12-09 DIAGNOSIS — I482 Chronic atrial fibrillation, unspecified: Secondary | ICD-10-CM

## 2015-12-09 LAB — PT/INR
PT INR: 1.7 — ABNORMAL HIGH (ref 0.9–1.1)
PT: 19.7 s — ABNORMAL HIGH (ref 12.6–15.0)

## 2015-12-10 ENCOUNTER — Ambulatory Visit (INDEPENDENT_AMBULATORY_CARE_PROVIDER_SITE_OTHER): Payer: Medicare Other | Admitting: Physician Assistant

## 2015-12-10 VITALS — BP 130/98 | Ht 73.0 in | Wt 200.0 lb

## 2015-12-10 DIAGNOSIS — I482 Chronic atrial fibrillation: Secondary | ICD-10-CM

## 2015-12-10 DIAGNOSIS — I4821 Permanent atrial fibrillation: Secondary | ICD-10-CM

## 2015-12-10 DIAGNOSIS — R0789 Other chest pain: Secondary | ICD-10-CM

## 2015-12-10 DIAGNOSIS — Z950 Presence of cardiac pacemaker: Secondary | ICD-10-CM

## 2015-12-10 DIAGNOSIS — I498 Other specified cardiac arrhythmias: Secondary | ICD-10-CM | POA: Insufficient documentation

## 2015-12-10 DIAGNOSIS — I499 Cardiac arrhythmia, unspecified: Secondary | ICD-10-CM

## 2015-12-10 DIAGNOSIS — Z006 Encounter for examination for normal comparison and control in clinical research program: Secondary | ICD-10-CM

## 2015-12-10 NOTE — Progress Notes (Signed)
[facility] OFFICE VISIT    [name] presents to the office today for leadless pacemaker site check.   He is a 65 yo male with chronic atrial fibrillation and slow ventricular response who underwent nanostim leadless pacemaker implantation on 06/2014.      CHA2DS2-VASc 5 for HTN, TIA, CAD, and age > 39.  He is anticoagulated with warfarin.     [name] continues to complain of decreased energy with exercise.  He has had several adjustments to the rate response feature on his PM.  He continues to swim regularly.  He says that approximately 1 week ago he experiened chest tightness, profuse sweating, and lightheadedness/presyncope in church, shortly after going for a swim.   He went to the ED and was ruled out for ACS by ECG and ezymes.  He is sheduled to see Dr. Janace [name] tomorrow for further evaluation.      Of note, pt had an abnormal Nuc ST and underwent LHC 2015, which showed 50% LAD stenosis and a 70% ostial D1 lesion, which was felt to be difficult to treat with PCI, so pt continued with medical management till this time.  He has not had consistent follow up with general cardiology.  Left ventriculogram 2015 showed LVEF 50-55%.      PMH:   Past Medical History   Diagnosis Date   . Hypertension    . Sick sinus syndrome    . Coronary artery disease 01/2014     mild inf ischemia   . Bronchiectasis    . Dyspnea on exertion    . Cardiac arrhythmia    . Hyperlipidemia      pt. denies pain   . Pacemaker    . TIA (transient ischemic attack)    . Migraine equivalent    . Neck pain      numbness in hands   . Back pain      numbness in feet   . Cold hands and feet    . Bilirubinemia    . Anxiety    . History of basal cell cancer    . History of basal cell cancer    . Atrial fibrillation 7/14 dx     holter7/14 rates 30-111, 05/2014 27-146, Avg 41  no pauses >2.5 sec. Holter 10/15 rates in 30s mostly 7 pm to 7 am, > 100 w exercise only   . Thoracic aortic ectasia 4.30 January 2013, 4.2 12/2014        MEDICATIONS:    Current Outpatient Prescriptions   Medication Sig Dispense Refill   . ALPRAZolam (XANAX) 0.5 MG tablet Take 1 tablet (0.5 mg total) by mouth every 12 (twelve) hours as needed. 30 tablet 1   . aspirin EC 81 MG EC tablet Take 81 mg by mouth daily.     Marland Kitchen losartan (COZAAR) 50 MG tablet TAKE 1 TABLET BY MOUTH EVERY DAY 30 tablet 3   . sertraline (ZOLOFT) 50 MG tablet 1/2 tab PO daily 1 week, then increase to 1 full tab PO daily 30 tablet 0   . warfarin (COUMADIN) 10 MG tablet Take 1 tablet (10 mg total) by mouth daily. Adjusted for inr 100 tablet 3   . azelastine (ASTELIN) 0.1 % nasal spray      . Fluticasone Propionate (FLONASE NA) 1 puff by Nasal route as needed.     Marland Kitchen PROAIR RESPICLICK 108 (90 BASE) MCG/ACT Aerosol Powder, Breath Activtivatede        No current facility-administered medications for  this visit.        SH:   Social History   Substance Use Topics   . Smoking status: Former Smoker -- 1.00 packs/day for 10 years     Quit date: 03/28/1984   . Smokeless tobacco: Never Used   . Alcohol Use: Yes      Comment: occasionally       REVIEW OF SYSTEMS: As above per HPI    PHYSICAL EXAM:  Filed Vitals:    12/10/15 1135   BP: 130/98   Height: 1.854 m (6\' 1" )   Weight: 90.719 kg (200 lb)     Gen:  well-developed, well-nourished 65 year old male, alert and oriented and in no acute distress  HEENT: normocephalic, atraumatic; sclera anicteric  Neck: good range of motion with trachea midline and no JVD appreciated  Chest:  good air entry bilaterally and lungs are clear to auscultation bilaterally  Heart: normal S1, S2; regular rate and rhythm; no significant murmur, rubs or gallops  Extremities: warm without edema       DEVICE INTERROGATION/REPROGRAMMING:    St Jude nano stim leadless PM, implanted 06/20/14, set in the VVIR mode with lpr 60 bpm, max sensor rate 140 bpm.  93% V paced.  Underlying rhythm AF with ventricular response 40s.  Impedence 460.  R wave sensed at 10 mV.  Threshold 0.5 V at 0.4 ms.  Battery > 3.3 V,  10.1 yrs to ERI.  Long term ventricular heart rate histogram within normal range with good variability (HRs 60-130s).  No evidence of V high rates per histograms, though nano PM does not provide arrhythmia detection.        IMPRESSION/RECOMMENDATIONS:   1. Permanent atrial fibrillation     2. Slow ventricular response     3. Cardiac pacemaker in situ--leadless nano stim     4. Chest tightness         Jonathan [name] is stable from an arrhythmia standpoint and his nanostim leadless PM is functioning normally.  He is RV paced  93%.  He is on warfarin for AF.      His rate response has been adjusted several times on previous visits with improvement in heart rate variability (HR range 60-130s), but persistent complaint of decreased energy with exercise.  Pt also had episode of chest tightness, profuse sweating, and lightheadedness/presyncope ~ 1 week ago and was ruled out for ACS in ED, however, pt has history of LAD stenosis and 70% ostial D1 lesion that was not felt to be amenable to PCI at time of LHC 2015.  Discussed with Dr. Kerrie Pleasure, who agrees with follow up with general cardiology, Dr. Thelma Barge, whom pt is scheduled to see tomorrow.  Assessment of LV function should be considered as part of work up given chronic RV pacing.      [name] will return to the office with [facility] representative present for Nanostim leadless pacemaker assessment and office visit in 6 months.      ----------------------------  Netty Starring, PA-C  [facility]  Office [phone]      Incident to service performed with physician present in the office in accordance with this patient's established plan of care.

## 2015-12-11 ENCOUNTER — Encounter (INDEPENDENT_AMBULATORY_CARE_PROVIDER_SITE_OTHER): Payer: Self-pay

## 2015-12-11 ENCOUNTER — Ambulatory Visit (INDEPENDENT_AMBULATORY_CARE_PROVIDER_SITE_OTHER): Payer: Medicare Other | Admitting: Internal Medicine

## 2015-12-11 ENCOUNTER — Encounter (INDEPENDENT_AMBULATORY_CARE_PROVIDER_SITE_OTHER): Payer: Self-pay | Admitting: Internal Medicine

## 2015-12-11 VITALS — BP 110/76 | HR 92 | Resp 16 | Ht 73.0 in | Wt 202.0 lb

## 2015-12-11 DIAGNOSIS — I495 Sick sinus syndrome: Secondary | ICD-10-CM

## 2015-12-11 DIAGNOSIS — R55 Syncope and collapse: Secondary | ICD-10-CM

## 2015-12-11 NOTE — Progress Notes (Signed)
[facility] OFFICE VISIT      Chief Complaint   Patient presents with   . Loss of Consciousness       I had the pleasure of seeing Mr. Lukehart today for cardiovascular follow up. He is a pleasant 65 y.o. male with a history of near syncope who presents for continued management.      Near syncopal episode while in church  Monday 11-30-15. EMS to [facility] ( released same day after normal CNS evaluation), saw EP ( checked our ok).  He is an avid swimmer and had swam that morning  One half hour before going to mass . While kneeling and standing during the mass,he was able to feel that he was getting dizzy and he was sweating profusely. He did feel some chest tightness and shortness of breath. According to people near him, "he looked as white as a sheet" and EMS was called and he was taken to the hospital.      Has has noticed  some decrease in exercise tolerance ( even swimming). He does have a pacemaker ( sick sinus syndrome). Does have documented CAD ( cath 03-28-2014).Coronary atherosclerosis with 40% to 50% stenosis in proximal left anterior descending coronary artery, 70% to 75% stenosis at origin of a edium-to-large diagonal branch, and nonobstructive disease seen in left circumflex and right coronary arteries. He's had no intervention.    MEDICATIONS:     Current Outpatient Prescriptions   Medication Sig Dispense Refill   . ALPRAZolam (XANAX) 0.5 MG tablet Take 1 tablet (0.5 mg total) by mouth every 12 (twelve) hours as needed. 30 tablet 1   . aspirin EC 81 MG EC tablet Take 81 mg by mouth daily.     . Fluticasone Propionate (FLONASE NA) 1 puff by Nasal route as needed.     Marland Kitchen losartan (COZAAR) 50 MG tablet TAKE 1 TABLET BY MOUTH EVERY DAY 30 tablet 3   . sertraline (ZOLOFT) 50 MG tablet 1/2 tab PO daily 1 week, then increase to 1 full tab PO daily 30 tablet 0   . warfarin (COUMADIN) 10 MG tablet Take 1 tablet (10 mg total) by mouth daily. Adjusted for inr 100 tablet 3     No current facility-administered  medications for this visit.       REVIEW OF SYSTEMS: All other systems reviewed and negative except as above.    PHYSICAL EXAMINATION  Vital Signs: BP 110/76 mmHg  Pulse 92  Resp 16  Ht 1.854 m (6\' 1" )  Wt 91.627 kg (202 lb)  BMI 26.66 kg/m2   Vital signs reviewed    Wt Readings from Last 3 Encounters:   12/11/15 91.627 kg (202 lb)   12/10/15 90.719 kg (200 lb)   11/30/15 91.445 kg (201 lb 9.6 oz)        General Appearance:  A well-appearing male in no acute distress.    HEENT: Sclera anicteric, conjunctiva without pallor, moist mucous membranes, normal dentition.   Neck:  Supple without jugular venous distention.  Normal carotid upstrokes without bruits.   Chest: Clear to auscultation bilaterally with good air movement and respiratory effort and no wheezes, rales, or rhonchi   Cardiac: RRR.  Normal S1 and physiologically split S2, without gallops or rub. No murmurs.  PMI of normal size and nondisplaced.   Vascular:  2+ carotid, radial, and distal pulses bilaterally  Abdomen: Soft, nontender, nondistended, with normoactive bowel sounds.  No pulsatile masses, or bruits.   Extremities: Warm without  edema, clubbing, or cyanosis.   Skin: No rash, warm, appropriate for race.   Neuro: Alert and oriented x3. Grossly intact.  CN II-XII intact.  Normal mood and affect.     ECG:   Independent review shows: Sinus Rhythm and Ventricular pacing    ASSESSMENT/PLAN:  Known Coronary Artery Diseaseor Sick Sinus Syndrome       Does have documented CAD ( cath 03-28-2014).Coronary atherosclerosis with 40% to 50% stenosis in proximal left anterior descending coronary artery, 70% to 75% stenosis at origin of a medium-to-large diagonal branch, and nonobstructive disease seen in left circumflex and right coronary arteries. He's had no intervention.  Permanent Pacemaker for treatment of sick sinus syndrome  Recent near syncopal episode        Strong likelihood of this being a vasovagal episode but also need an ischemic work up the face  of coronary artery disease     Plan  Lexiscan Dual Isotope study  2 D ECHO  Janace Litten, MD,  12/11/2015

## 2015-12-14 ENCOUNTER — Ambulatory Visit (INDEPENDENT_AMBULATORY_CARE_PROVIDER_SITE_OTHER): Payer: Medicare Other | Admitting: Family Medicine

## 2015-12-14 ENCOUNTER — Encounter (INDEPENDENT_AMBULATORY_CARE_PROVIDER_SITE_OTHER): Payer: Self-pay | Admitting: Family Medicine

## 2015-12-14 VITALS — BP 131/84 | HR 96 | Temp 97.3°F | Ht 73.0 in | Wt 207.0 lb

## 2015-12-14 DIAGNOSIS — F419 Anxiety disorder, unspecified: Secondary | ICD-10-CM

## 2015-12-14 DIAGNOSIS — R55 Syncope and collapse: Secondary | ICD-10-CM

## 2015-12-14 NOTE — Patient Instructions (Signed)
Your Body's Response to Anxiety    Normal anxiety is part of the body's natural defense system. It's an alert to a threat that is unknown, vague, or comes from your own internal fears.While you're in this state, your feelings can range from a vague sense of worry to physical sensations such as a pounding heartbeat. These feelings make you want to react to the threat. An anxiety response is normal in many situations. But when you have an anxiety disorder, the same response can occur at the wrong times.  Anxiety can be helpful  Normal anxiety is a signal from your brain that warns you of a threat and is a normal response to help you prevent something or decrease the bad effects of something you can't control. For example, anxiety is a normal response to situations that might damage your body, separate you from a loved one, or lose your job. The symptoms of anxiety can be physical and mental.  How does it feel?  At certain times, people with anxiety may have:   Dizziness   Muscle tension or pain   Restlessness   Sleeplessness   Difficulty concentrating   Racing heartbeat   Fast breathing   Shaking or trembling   Stomachache   Diarrhea   Loss of energy   Sweating   Cold, clammy hands   Chest pain   Dry mouth  Anxiety can also be a problem  Anxiety can become a problem when it is difficult to control, occurs for months, and interferes with important parts of your life. With an anxiety disorder, your body has the response described above, but in inappropriate ways. The response a person has depends on the anxiety disorder he or she has. With some disorders, the anxiety is way out of proportion to the threat that triggers it. With others, anxiety may occur even when there isn't a clear threat or trigger.  Who does it affect?  Some people are more prone to persistent anxiety than others. It tends to run in families, and it affects more younger people than older people. But no age, race, or gender is immune  to anxiety problems.  Anxiety can be treated  The good news is that the anxiety that's disrupting your life can be treated. Working with your doctor or other healthcare provider, you can develop skills to help you cope with anxiety. You can also gain the perspective you need to overcome your fears. Note:Good sources of support or guidance can be found at your local hospital, mental health clinic, or an employee assistance program.    If anxiety is wearing you down, here are some things you can do to cope:   Keep in mind that you can't control everything about a situation. Change what you can and let the rest take its course.   Exercise--it's a great way to relieve tension and help your body feel relaxed.   Avoid caffeine and nicotine, which can make anxiety symptoms worse.   Fight the temptation to turn to alcohol or unprescribed drugs for relief. They only make things worse in the long run.   Date Last Reviewed: 08/19/2013   2000-2016 [employer] [address], [place], [place] [address]. All rights reserved. This information is not intended as a substitute for professional medical care. Always follow your healthcare professional's instructions.        AnxietyReaction  Anxiety is the feeling we all get when we think something bad might happen. It is a normal  response to stress and usually causes only a mild reaction. When anxiety becomes more severe, it caninterfere with daily life. In some cases, you may not even be aware of what it is you're anxious about. There may also be a genetic link or it may be a learned behavior in the home.  Both psychological and physical triggers cause stress reaction. It's often a response to fear or emotional stress, real or imagined. This stress may come from home, family, work, or social relationships.  During an anxiety reaction, you may feel:   Helpless   Nervous   Depressed   Irritable  Your body may show signs of anxiety in many ways. You may  experience:   Dry mouth   Shakiness   Dizziness   Weakness   Trouble breathing   Breathing fast (hyperventilating)   Chest pressure   Sweating   Headache   Nausea   Diarrhea   Tiredness   Inability to sleep   Sexual problems  Home care   Try to locate the sources of stress in your life. They may not be obvious. These may include:   Daily hassles of life (traffic jams, missed appointments, car troubles, etc.)   Major life changes, both good (new baby, job promotion) and bad (loss of job, loss of loved one)   Overload: feeling that you have too many responsibilities and can't take care of all of them at once   Feeling helpless, feeling that your problems are beyond what you're able to solve   Notice how your body reacts to stress. Learn to listen to your body signals. This will help you take action before the stress becomes severe.   When you can, do something about the source of your stress. (Avoid hassles, limit the amount of change that happens in your life at one time and take a break when you feel overloaded).   Unfortunately, many stressful situations can't be avoided. It is necessary to learn how to better manage stress. There are many proven methods that will reduce your anxiety. These include simple things like exercise, good nutrition and adequate rest. Also, there are certain techniques that are helpful:   Relaxation   Breathing exercises   Visualization   Biofeedback   Meditation  For more information about this, consult your doctor or go to a local bookstore and review the many books and tapes available on this subject.  Follow-up care  If you feel that your anxiety is not responding to self-help measures, contact your doctor or make an appointment with a counselor. You may need short-term psychological counseling and temporary medicine to help you manage stress.  Call 911  Call your healthcare provider right away if any of these occur:   Trouble  breathing   Confusion   Drowsiness or trouble wakening   Fainting or loss of consciousness   Rapid heart rate   Seizure   New chest pain that becomes more severe, lasts longer, or spreads into your shoulder, arm, neck, jaw, or back  When to seek medical advice  Call your healthcare provider right away if any of these occur:   Your symptoms get worse   Severe headache not relieved by rest and mild pain reliever  Date Last Reviewed: 04/29/2014   2000-2016 [employer] [address] [place], [place] [address]. All rights reserved. This information is not intended as a substitute for professional medical care. Always follow your healthcare professional's instructions.

## 2015-12-14 NOTE — Progress Notes (Signed)
Subjective:      Patient ID: Jonathan [name] is a 65 y.o. male     Chief Complaint   Patient presents with   . Anxiety     follow up of medications started 2 weeks ago. tolerating zoloft without medication side effects. has noticed he's less easily teaful since starting on the medication. he's taken about 3 xanax tablets over the last 2 weeks which was helpful at reducing increased anxiety symptoms, helped with sleep        HPI     Less demonstrative of his emotions, doesn't cry as easily as he has in the past. Unsure if these are good or bad things at the moment. Sometimes feels like he should have to feel these emotions.     A few days after starting on the medication, while at church, he had an episode where he felt flushed and like he was going to pass out. Was seen in the ER where he was diagnosed as having had a vasovagal episode. He's since followed up with his cardiology who also thought the incident was due to vasovagal response. He is for 2D Echo and NM stress test to further evaluate the heart though. No additional episodes since then.     The following portions of the patient's history were reviewed and updated as appropriate: allergies, current medications and problem list.    Review of Systems   Constitutional: Negative for activity change and appetite change.   Gastrointestinal: Negative for abdominal pain, diarrhea and constipation.   Neurological: Negative for headaches.   Psychiatric/Behavioral: Positive for sleep disturbance (improved some when he took one xanax tablet when he was having significant anxiety preventing him from sleeping) and dysphoric mood. Negative for suicidal ideas and self-injury. The patient is nervous/anxious.           BP 131/84 mmHg  Pulse 96  Temp(Src) 97.3 F (36.3 C) (Oral)  Ht 1.854 m (6\' 1" )  Wt 93.895 kg (207 lb)  BMI 27.32 kg/m2     Objective:     Physical Exam   Constitutional: He is oriented to person, place, and time. He appears well-developed and  well-nourished.   Pulmonary/Chest: Effort normal. No respiratory distress.   Neurological: He is alert and oriented to person, place, and time. He exhibits normal muscle tone.   Psychiatric: His speech is normal and behavior is normal. Thought content normal. His mood appears anxious.   Vitals reviewed.         Assessment:     1. Anxiety    2. Pre-syncope       Plan:     Acute on chronic. Tolerating zoloft and xanax without problems. Continue daily zoloft and pr xanax for severe anxiety symptoms.     Resolved. Thought to be result of vasovagal phenomenon. F/u with cardiology as previously scheduled for continued evaluation.     Emelda Brothers, MD

## 2015-12-16 ENCOUNTER — Encounter (INDEPENDENT_AMBULATORY_CARE_PROVIDER_SITE_OTHER): Payer: Self-pay | Admitting: Internal Medicine

## 2015-12-24 ENCOUNTER — Ambulatory Visit (INDEPENDENT_AMBULATORY_CARE_PROVIDER_SITE_OTHER): Payer: Medicare Other

## 2015-12-24 ENCOUNTER — Ambulatory Visit (INDEPENDENT_AMBULATORY_CARE_PROVIDER_SITE_OTHER): Payer: Medicare Other | Admitting: Cardiovascular Disease

## 2015-12-24 VITALS — Ht 73.0 in | Wt 195.0 lb

## 2015-12-24 DIAGNOSIS — R0600 Dyspnea, unspecified: Secondary | ICD-10-CM

## 2015-12-24 DIAGNOSIS — I2510 Atherosclerotic heart disease of native coronary artery without angina pectoris: Secondary | ICD-10-CM

## 2015-12-24 DIAGNOSIS — R55 Syncope and collapse: Secondary | ICD-10-CM

## 2015-12-24 DIAGNOSIS — Z950 Presence of cardiac pacemaker: Secondary | ICD-10-CM

## 2015-12-24 DIAGNOSIS — I495 Sick sinus syndrome: Secondary | ICD-10-CM

## 2015-12-24 DIAGNOSIS — I4821 Permanent atrial fibrillation: Secondary | ICD-10-CM

## 2015-12-24 DIAGNOSIS — E785 Hyperlipidemia, unspecified: Secondary | ICD-10-CM

## 2015-12-24 DIAGNOSIS — R0609 Other forms of dyspnea: Secondary | ICD-10-CM

## 2015-12-24 MED ORDER — TECHNETIUM TC 99M TETROFOSMIN INJECTION
1.0000 | Freq: Once | Status: AC
Start: 2015-12-24 — End: 2015-12-24
  Administered 2015-12-24: 1 via INTRAVENOUS

## 2015-12-24 MED ORDER — REGADENOSON 0.4 MG/5ML IV SOLN
0.4000 mg | Freq: Once | INTRAVENOUS | Status: AC
Start: 2015-12-24 — End: 2015-12-24
  Administered 2015-12-24: 0.4 mg via INTRAVENOUS

## 2015-12-24 NOTE — Procedures (Signed)
[facility] NUCLEAR ECG REPORT    [facility] - [facility]  Tel [phone]      PATIENT:  Jonathan [name]         MRN: [id number].  Gender: male.  DOB: September 11, 1950.  Age: 65 y.o.    Date of study:  [id number]    Ordering Physician:  Janace Litten, MD  Primary Physician:  Emelda Brothers, MD  Primary Cardiologist:  Janace Litten, MD    INDICATION:  Near syncope, Sick sinus syndrome    LEXISCAN ECG DATA:  Resting ECG:  Vpaced    Protocol:  Lexiscan  Stress time:  1 minutes 27 seconds.   Vitals at rest:  83 bpm, 140/66 mmHg  Vitals at peak stress:  80 bpm, 120/72 mmHg    Reason for stopping exercise:  Protocol completed   ST changes at peak stress:  indeterminate  Arrhythmias:  none  Recovery:  normal    LEXISCAN ECG IMPRESSION:  1. No chest pain concerning for myocardial ischemia with pharmacologic stress. Indeterminate ECG.  2. Nuclear Imaging report will be reported separately       Lexiscan ECG interpreted and electronically signed by:  Leamon Arnt, MD

## 2015-12-25 ENCOUNTER — Ambulatory Visit (INDEPENDENT_AMBULATORY_CARE_PROVIDER_SITE_OTHER): Payer: Medicare Other

## 2015-12-25 ENCOUNTER — Telehealth (INDEPENDENT_AMBULATORY_CARE_PROVIDER_SITE_OTHER): Payer: Self-pay | Admitting: Internal Medicine

## 2015-12-25 ENCOUNTER — Other Ambulatory Visit (INDEPENDENT_AMBULATORY_CARE_PROVIDER_SITE_OTHER): Payer: Self-pay | Admitting: Family Medicine

## 2015-12-25 ENCOUNTER — Telehealth (INDEPENDENT_AMBULATORY_CARE_PROVIDER_SITE_OTHER): Payer: Self-pay

## 2015-12-25 DIAGNOSIS — R55 Syncope and collapse: Secondary | ICD-10-CM

## 2015-12-25 NOTE — Telephone Encounter (Signed)
L/M regarding nuclear results per Dr. Ezzard Standing

## 2015-12-25 NOTE — Telephone Encounter (Signed)
-----   Message from Stephens Shire, MD sent at 12/24/2015  2:32 PM EDT -----  Regarding: nuc  No significant abnormalities. F/u as directed

## 2015-12-25 NOTE — Telephone Encounter (Signed)
I called patient, no answer. L/M on machine requesting he call me back at [phone].     Notes Recorded by Janace Litten, MD on 12/24/2015 at 9:35 PM  Normal stress test

## 2015-12-29 ENCOUNTER — Telehealth (INDEPENDENT_AMBULATORY_CARE_PROVIDER_SITE_OTHER): Payer: Self-pay | Admitting: Cardiovascular Disease

## 2015-12-29 NOTE — Telephone Encounter (Signed)
I, left a message to the pt regarding the instructions of echo result, also I, faxed th final to his pcp AS WELL.

## 2015-12-29 NOTE — Telephone Encounter (Signed)
-----   Message from Stephens Shire, MD sent at 12/25/2015  1:08 PM EDT -----  Regarding: echo  No changes. F/u as doirected

## 2016-01-14 ENCOUNTER — Ambulatory Visit (INDEPENDENT_AMBULATORY_CARE_PROVIDER_SITE_OTHER): Payer: Medicare Other | Admitting: Internal Medicine

## 2016-01-14 ENCOUNTER — Encounter (INDEPENDENT_AMBULATORY_CARE_PROVIDER_SITE_OTHER): Payer: Self-pay | Admitting: Internal Medicine

## 2016-01-14 ENCOUNTER — Other Ambulatory Visit
Admission: RE | Admit: 2016-01-14 | Discharge: 2016-01-14 | Disposition: A | Discharge status: Home / Self Care | Payer: Medicare Other | Source: Ambulatory Visit | Attending: Internal Medicine | Admitting: Internal Medicine

## 2016-01-14 VITALS — BP 116/75 | HR 83 | Ht 73.0 in | Wt 201.0 lb

## 2016-01-14 DIAGNOSIS — I482 Chronic atrial fibrillation, unspecified: Secondary | ICD-10-CM

## 2016-01-14 DIAGNOSIS — I159 Secondary hypertension, unspecified: Secondary | ICD-10-CM

## 2016-01-14 LAB — PT/INR
PT INR: 1.9 — ABNORMAL HIGH (ref 0.9–1.1)
PT: 21.6 s — ABNORMAL HIGH (ref 12.6–15.0)

## 2016-01-14 MED ORDER — WARFARIN SODIUM 10 MG PO TABS
10.0000 mg | ORAL_TABLET | Freq: Every day | ORAL | Status: DC
Start: 2016-01-14 — End: 2017-01-18

## 2016-01-14 MED ORDER — LOSARTAN POTASSIUM 50 MG PO TABS
ORAL_TABLET | ORAL | Status: DC
Start: 2016-01-14 — End: 2017-01-18

## 2016-01-14 NOTE — Progress Notes (Signed)
[facility] OFFICE VISIT      Chief Complaint   Patient presents with   . Follow-up       I had the pleasure of seeing Jonathan [name] today for cardiovascular follow up. He is a pleasant 65 y.o. male with a history of vasovagal syncope who presents for continued management.  ECHO and Nuclear angio were both normal. Has not happened since.       MEDICATIONS:     Current Outpatient Prescriptions   Medication Sig Dispense Refill   . ALPRAZolam (XANAX) 0.5 MG tablet Take 1 tablet (0.5 mg total) by mouth every 12 (twelve) hours as needed. 30 tablet 1   . aspirin EC 81 MG EC tablet Take 81 mg by mouth daily.     . Fluticasone Propionate (FLONASE NA) 1 puff by Nasal route as needed.     Marland Kitchen losartan (COZAAR) 50 MG tablet TAKE 1 TABLET BY MOUTH EVERY DAY 30 tablet 3   . sertraline (ZOLOFT) 50 MG tablet Take 1 tablet (50 mg total) by mouth daily. 30 tablet 2   . warfarin (COUMADIN) 10 MG tablet Take 1 tablet (10 mg total) by mouth daily. Adjusted for inr 100 tablet 3     No current facility-administered medications for this visit.     REVIEW OF SYSTEMS: All other systems reviewed and negative except as above.    PHYSICAL EXAMINATION  Vital Signs: BP 116/75 mmHg  Pulse 83  Ht 1.854 m (6\' 1" )  Wt 91.173 kg (201 lb)  BMI 26.52 kg/m2   Vital signs reviewed    Wt Readings from Last 3 Encounters:   01/14/16 91.173 kg (201 lb)   12/24/15 88.451 kg (195 lb)   12/14/15 93.895 kg (207 lb)        General Appearance:  A well-appearing male in no acute distress.    HEENT: Sclera anicteric, conjunctiva without pallor, moist mucous membranes, normal dentition.   Neck:  Supple without jugular venous distention.  Normal carotid upstrokes without bruits.   Chest: Clear to auscultation bilaterally with good air movement and respiratory effort and no wheezes, rales, or rhonchi   Cardiac: RRR.  Normal S1 and physiologically split S2, without gallops or rub. No murmurs.  PMI of normal size and nondisplaced.   Vascular:  2+ carotid,  radial, and distal pulses bilaterally  Abdomen: Soft, nontender, nondistended, with normoactive bowel sounds.  No pulsatile masses, or bruits.   Extremities: Warm without edema, clubbing, or cyanosis.   Skin: No rash, warm, appropriate for race.   Neuro: Alert and oriented x3. Grossly intact.  CN II-XII intact.  Normal mood and affect.     ASSESSMENT/PLAN:  Vasovagal Syncope       No recurrent episodes  Paroxysmal Atrial Fibrillation  Permanent Pacemaker    Plan    RTC  6 months    Janace Litten, MD  01/14/2016

## 2016-01-15 ENCOUNTER — Telehealth (INDEPENDENT_AMBULATORY_CARE_PROVIDER_SITE_OTHER): Payer: Self-pay | Admitting: Internal Medicine

## 2016-01-15 NOTE — Telephone Encounter (Signed)
Patient's INR from 01/14/16 1.9. I called patient, no answer. L/M on machine for patient to call me back at [phone] to discuss Coumadin dosing.

## 2016-02-19 ENCOUNTER — Inpatient Hospital Stay
Admission: RE | Admit: 2016-02-19 | Discharge: 2016-02-19 | Disposition: A | Discharge status: Home / Self Care | Payer: Medicare Other | Source: Ambulatory Visit | Attending: Cardiology | Admitting: Cardiology

## 2016-02-19 DIAGNOSIS — I482 Chronic atrial fibrillation: Secondary | ICD-10-CM | POA: Insufficient documentation

## 2016-02-19 LAB — PT/INR
PT INR: 3.2 — ABNORMAL HIGH (ref 0.9–1.1)
PT: 31.9 s — ABNORMAL HIGH (ref 12.6–15.0)

## 2016-02-22 ENCOUNTER — Telehealth (INDEPENDENT_AMBULATORY_CARE_PROVIDER_SITE_OTHER): Payer: Self-pay | Admitting: Internal Medicine

## 2016-02-22 NOTE — Telephone Encounter (Signed)
INR from 02/19/16 3.2. I called to discuss Coumadin dosing with patient. No answer. I left a message on machine requesting patient call me back at [phone].

## 2016-02-25 ENCOUNTER — Other Ambulatory Visit (INDEPENDENT_AMBULATORY_CARE_PROVIDER_SITE_OTHER): Payer: Self-pay | Admitting: Family Medicine

## 2016-02-25 MED ORDER — SERTRALINE HCL 50 MG PO TABS
50.0000 mg | ORAL_TABLET | Freq: Every day | ORAL | Status: DC
Start: 2016-02-25 — End: 2016-06-17

## 2016-02-25 NOTE — Telephone Encounter (Signed)
02/25/16@2 :52pm - would like sertraline (ZOLOFT) 50 MG tablet sent to pharmacy. Wants 100 mg once a day. Wants a call when available [phone].

## 2016-02-25 NOTE — Telephone Encounter (Signed)
Request sent to provider.

## 2016-03-11 ENCOUNTER — Other Ambulatory Visit
Admission: RE | Admit: 2016-03-11 | Discharge: 2016-03-11 | Disposition: A | Discharge status: Home / Self Care | Payer: Medicare Other | Source: Ambulatory Visit | Attending: Internal Medicine | Admitting: Internal Medicine

## 2016-03-11 DIAGNOSIS — I482 Chronic atrial fibrillation, unspecified: Secondary | ICD-10-CM

## 2016-03-11 LAB — PT/INR
PT INR: 2.4 — ABNORMAL HIGH (ref 0.9–1.1)
PT: 26.1 s — ABNORMAL HIGH (ref 12.6–15.0)

## 2016-03-14 ENCOUNTER — Telehealth (INDEPENDENT_AMBULATORY_CARE_PROVIDER_SITE_OTHER): Payer: Self-pay | Admitting: Internal Medicine

## 2016-03-14 NOTE — Telephone Encounter (Signed)
Patient's INR on 8/11 was 2.4. I called patient to discuss and find out how much Coumadin he is currently taking. No answer. Unable to leave a message. Will try again later.

## 2016-04-27 ENCOUNTER — Other Ambulatory Visit
Admission: RE | Admit: 2016-04-27 | Discharge: 2016-04-27 | Disposition: A | Discharge status: Home / Self Care | Payer: Medicare Other | Source: Ambulatory Visit | Attending: Internal Medicine | Admitting: Internal Medicine

## 2016-04-27 DIAGNOSIS — I482 Chronic atrial fibrillation, unspecified: Secondary | ICD-10-CM

## 2016-04-27 LAB — PT/INR
PT INR: 2.9 — ABNORMAL HIGH (ref 0.9–1.1)
PT: 29.9 s — ABNORMAL HIGH (ref 12.6–15.0)

## 2016-04-28 ENCOUNTER — Telehealth (INDEPENDENT_AMBULATORY_CARE_PROVIDER_SITE_OTHER): Payer: Self-pay | Admitting: Internal Medicine

## 2016-04-28 NOTE — Telephone Encounter (Signed)
I called patient to inform patient INR result from 04/27/16 was 2.9. No answer. I left a message on patient's voicemail. I requested patient call me back at [phone] to inform me of what dose he is currently taking of Coumadin.

## 2016-05-27 ENCOUNTER — Other Ambulatory Visit
Admission: RE | Admit: 2016-05-27 | Discharge: 2016-05-27 | Disposition: A | Discharge status: Home / Self Care | Payer: Medicare Other | Source: Ambulatory Visit | Attending: Internal Medicine | Admitting: Internal Medicine

## 2016-05-27 DIAGNOSIS — I482 Chronic atrial fibrillation, unspecified: Secondary | ICD-10-CM

## 2016-05-27 LAB — PT/INR
PT INR: 3.5 — ABNORMAL HIGH (ref 0.9–1.1)
PT: 34.4 s — ABNORMAL HIGH (ref 12.6–15.0)

## 2016-05-30 ENCOUNTER — Telehealth (INDEPENDENT_AMBULATORY_CARE_PROVIDER_SITE_OTHER): Payer: Self-pay | Admitting: Internal Medicine

## 2016-05-30 NOTE — Telephone Encounter (Signed)
Patient's INR result from 05/27/16 3.5. I called patient, no answer. I left a detailed message on patient's v/m informing him INR 3.5. I asked patient to call me back with his current Coumadin dosing, as we have no records and informed patient he would probably need to hold his dose one day. I requested patient call me back at [phone].

## 2016-06-10 ENCOUNTER — Telehealth (INDEPENDENT_AMBULATORY_CARE_PROVIDER_SITE_OTHER): Payer: Self-pay

## 2016-06-10 ENCOUNTER — Other Ambulatory Visit
Admission: RE | Admit: 2016-06-10 | Discharge: 2016-06-10 | Disposition: A | Discharge status: Home / Self Care | Payer: Medicare Other | Source: Ambulatory Visit | Attending: Internal Medicine | Admitting: Internal Medicine

## 2016-06-10 DIAGNOSIS — I482 Chronic atrial fibrillation, unspecified: Secondary | ICD-10-CM

## 2016-06-10 LAB — PT/INR
PT INR: 2.6 — ABNORMAL HIGH (ref 0.9–1.1)
PT: 27.7 s — ABNORMAL HIGH (ref 12.6–15.0)

## 2016-06-10 NOTE — Telephone Encounter (Signed)
I tried contacting patient regarding INR from today, result 2.6. I left message on patient's voicemail informing him of result. I also requested patient call me back to let me know what dose he is currently taking as we have no record of his dose.

## 2016-06-14 ENCOUNTER — Telehealth (INDEPENDENT_AMBULATORY_CARE_PROVIDER_SITE_OTHER): Payer: Self-pay | Admitting: Family Medicine

## 2016-06-14 ENCOUNTER — Other Ambulatory Visit (INDEPENDENT_AMBULATORY_CARE_PROVIDER_SITE_OTHER): Payer: Self-pay

## 2016-06-14 NOTE — Telephone Encounter (Signed)
Pt called to request a refill on sertraline (ZOLOFT) 50 MG tablet and wants it to be sent to [facility] [address], [place] - [address] [phone] (Phone)  [phone] (Fax)        Pt can be reached at [phone]

## 2016-06-14 NOTE — Telephone Encounter (Signed)
Request sent to provider.

## 2016-06-14 NOTE — Telephone Encounter (Signed)
Can you check with patient to see if he wants 50mg  or 100mg  tablets?

## 2016-06-16 ENCOUNTER — Encounter (INDEPENDENT_AMBULATORY_CARE_PROVIDER_SITE_OTHER): Payer: Self-pay | Admitting: Family Medicine

## 2016-06-16 ENCOUNTER — Encounter (INDEPENDENT_AMBULATORY_CARE_PROVIDER_SITE_OTHER): Payer: Self-pay | Admitting: Physician Assistant

## 2016-06-16 ENCOUNTER — Ambulatory Visit (INDEPENDENT_AMBULATORY_CARE_PROVIDER_SITE_OTHER): Payer: Medicare Other | Admitting: Physician Assistant

## 2016-06-16 VITALS — BP 115/80 | Ht 73.0 in | Wt 200.0 lb

## 2016-06-16 DIAGNOSIS — I482 Chronic atrial fibrillation: Secondary | ICD-10-CM

## 2016-06-16 DIAGNOSIS — Z7901 Long term (current) use of anticoagulants: Secondary | ICD-10-CM

## 2016-06-16 DIAGNOSIS — I499 Cardiac arrhythmia, unspecified: Secondary | ICD-10-CM

## 2016-06-16 DIAGNOSIS — I498 Other specified cardiac arrhythmias: Secondary | ICD-10-CM

## 2016-06-16 DIAGNOSIS — Z006 Encounter for examination for normal comparison and control in clinical research program: Secondary | ICD-10-CM

## 2016-06-16 DIAGNOSIS — I4821 Permanent atrial fibrillation: Secondary | ICD-10-CM

## 2016-06-16 DIAGNOSIS — Z950 Presence of cardiac pacemaker: Secondary | ICD-10-CM

## 2016-06-16 NOTE — Progress Notes (Addendum)
[facility] VISIT    [name] presents to the office today for leadless pacemaker site check.   He is a 65 yo male with chronic atrial fibrillation and slow ventricular response who underwent SJM Nanostim leadless pacemaker implantation on 06/20/14.      CHA2DS2-VASc 5 for HTN, TIA, CAD, and age > 57.  He is anticoagulated with warfarin.     Mr. Turpen continues to complain of decreased energy with exercise despite several adjustments to his rate response settings with subsequent histograms showing improvement in heart rate variability.  He has a history of CAD and underwent repeat TTE and stress imaging 11/2015, both of which were normal.  Today he is requesting lowering of his max sensor rate because he feels this may help his symptoms.  He has He continues to swim regularly.  He denies wt changes, chest pain, palpitations, orthopnea, LE edema, lightheadedness, presyncope or syncope.      TTE 11/2015 normal LV dimension, EF 55-60%, normal RV, mild BAE, trace MR/TR, mild ascending aorta dilation, 4.2 cm    Nuc ST 11/2015 (regadenoson) normal perfusion, no ischemia or scar      PMH:   Past Medical History:   Diagnosis Date   . Anxiety    . Atrial fibrillation 7/14 dx    holter7/14 rates 30-111, 05/2014 27-146, Avg 41  no pauses >2.5 sec. Holter 10/15 rates in 30s mostly 7 pm to 7 am, > 100 w exercise only   . Back pain     numbness in feet   . Bilirubinemia    . Bronchiectasis    . Cardiac arrhythmia    . Cold hands and feet    . Coronary artery disease 01/2014    mild inf ischemia   . Dyspnea on exertion    . History of basal cell cancer    . History of basal cell cancer    . Hyperlipidemia     pt. denies pain   . Hypertension    . Migraine equivalent    . Neck pain     numbness in hands   . Pacemaker    . Sick sinus syndrome    . Thoracic aortic ectasia 4.30 January 2013, 4.2 12/2014   . TIA (transient ischemic attack)         MEDICATIONS:   Current Outpatient Prescriptions   Medication Sig Dispense Refill   .  ALPRAZolam (XANAX) 0.5 MG tablet Take 1 tablet (0.5 mg total) by mouth every 12 (twelve) hours as needed. 30 tablet 1   . aspirin EC 81 MG EC tablet Take 81 mg by mouth daily.     . Fluticasone Propionate (FLONASE NA) 1 puff by Nasal route as needed.     Marland Kitchen losartan (COZAAR) 50 MG tablet TAKE 1 TABLET BY MOUTH EVERY DAY 90 tablet 3   . sertraline (ZOLOFT) 50 MG tablet Take 1 tablet (50 mg total) by mouth daily. 90 tablet 1   . warfarin (COUMADIN) 10 MG tablet Take 1 tablet (10 mg total) by mouth daily. Adjusted for inr 100 tablet 3     No current facility-administered medications for this visit.         SH:   Social History   Substance Use Topics   . Smoking status: Former Smoker     Packs/day: 1.00     Years: 10.00     Quit date: 03/28/1984   . Smokeless tobacco: Never Used   . Alcohol use Yes  Comment: occasionally       REVIEW OF SYSTEMS: As above per HPI    PHYSICAL EXAM:  Vitals:    06/16/16 1136   BP: 115/80   BP Site: Right arm   Patient Position: Sitting   Weight: 90.7 kg (200 lb)   Height: 1.854 m (6\' 1" )     Gen:  well-developed, well-nourished 65 year old male, alert and oriented and in no acute distress  HEENT: normocephalic, atraumatic; sclera anicteric  Neck: good range of motion with trachea midline and no JVD appreciated  Chest:  good air entry bilaterally and lungs are clear to auscultation bilaterally  Heart: normal S1, S2; regular rate and rhythm; no significant murmur, rubs or gallops  Extremities: warm without edema       DEVICE INTERROGATION/REPROGRAMMING:    [employer] Nanostim leadless PM, implanted 06/20/14, set in the VVIR mode with lpr 60 bpm, max sensor rate 140 bpm.  94% V paced.  Underlying rhythm AF with ventricular response high 40s-50 bpm.  R wave sensed at 9 mV, with stable trend.  Threshold 0.5 V at 0.4 ms, with stable trend.  Battery > 3.3 V, 9.3 years estimated longevity.  Long term ventricular heart rate histogram within normal range with good variability (HRs 60-130s).  No  evidence of V high rates per histograms, though nano PM does not provide arrhythmia detection.        IMPRESSION/RECOMMENDATIONS:   1. Permanent atrial fibrillation     2. Slow ventricular response     3. Cardiac pacemaker in situ--leadless nano stim       -Normal SJM Nanostim leadless PM function  -Underlying rhythm AF with V-rate high 40s-50 bpm  -Anticoagulated with warfarin for AF  -RV-paced 94%, no signs/symptoms of HF, TTE 11/2015 LVEF 55-60%  -pt continues to c/o decreased energy with exercise, but shows good heart rate variability after several adjustments to his rate response settings, and continues to swim regularly.  He asked for his max sensor rate to be decreased, but after discussion agreed to keep it at current setting as lowering it may be counterproductive from the standpoint of increasing his exercise capacity  -Pt's PM is under advisory for sudden early battery depletion.   This has affected < 1% of Nanostim devices worldwide.  All cases have occurred between 29-37 months post implant.   Pt is now 24 months post-implant.  The recommend monitoring protocol at this time is monthly TTMs to check battery, and office PM checks every 6 months with a [employer] representative.  The advisory was discussed in detail and pt understands the monitoring protocol going forward.  Pt was given a TTM device today and instructed on how to use it by our remote monitoring technician.    -RTO 6 months with SJM representative present  -Monthly TTMs    ----------------------------  Netty Starring, PA-C  [facility]  Office [phone]      Incident to service performed with physician present in the office in accordance with this patient's established plan of care.

## 2016-06-17 ENCOUNTER — Other Ambulatory Visit (INDEPENDENT_AMBULATORY_CARE_PROVIDER_SITE_OTHER): Payer: Self-pay | Admitting: Family Medicine

## 2016-06-17 MED ORDER — SERTRALINE HCL 50 MG PO TABS
ORAL_TABLET | ORAL | 1 refills | Status: DC
Start: 2016-06-17 — End: 2017-02-06

## 2016-06-21 ENCOUNTER — Encounter (INDEPENDENT_AMBULATORY_CARE_PROVIDER_SITE_OTHER): Payer: Medicare Other | Admitting: Physician Assistant

## 2016-07-22 ENCOUNTER — Telehealth (INDEPENDENT_AMBULATORY_CARE_PROVIDER_SITE_OTHER): Payer: Medicare Other | Admitting: Clinical Cardiac Electrophysiology

## 2016-07-22 ENCOUNTER — Encounter (INDEPENDENT_AMBULATORY_CARE_PROVIDER_SITE_OTHER): Payer: Self-pay

## 2016-07-22 DIAGNOSIS — Z950 Presence of cardiac pacemaker: Secondary | ICD-10-CM

## 2016-07-22 DIAGNOSIS — Z45018 Encounter for adjustment and management of other part of cardiac pacemaker: Secondary | ICD-10-CM

## 2016-08-05 NOTE — Progress Notes (Unsigned)
[facility] TTM Report    Today, a TTM was performed on this patient.     St[employer] Jude leadless pacemaker, Nano.  Implanted 06/20/2014    Normal output and battery function were observed.    PLAN: Routine PM follow up.

## 2016-08-17 ENCOUNTER — Other Ambulatory Visit
Admission: RE | Admit: 2016-08-17 | Discharge: 2016-08-17 | Disposition: A | Discharge status: Home / Self Care | Payer: Medicare Other | Source: Ambulatory Visit | Attending: Internal Medicine | Admitting: Internal Medicine

## 2016-08-17 DIAGNOSIS — I482 Chronic atrial fibrillation, unspecified: Secondary | ICD-10-CM

## 2016-08-17 LAB — PT/INR
PT INR: 2.9 — ABNORMAL HIGH (ref 0.9–1.1)
PT: 30 s — ABNORMAL HIGH (ref 12.6–15.0)

## 2016-08-18 ENCOUNTER — Telehealth (INDEPENDENT_AMBULATORY_CARE_PROVIDER_SITE_OTHER): Payer: Self-pay

## 2016-08-18 NOTE — Telephone Encounter (Signed)
I called and left a message on patient's voicemail informing him that his INR result was 2.9 on 08/17/16. I requested patient call me back at [phone] to let me know what he is currently taking of Coumadin so that I can update his chart as we have no record of his current dosing.

## 2016-08-26 ENCOUNTER — Ambulatory Visit (INDEPENDENT_AMBULATORY_CARE_PROVIDER_SITE_OTHER): Payer: Medicare Other | Admitting: Clinical Cardiac Electrophysiology

## 2016-08-26 DIAGNOSIS — Z950 Presence of cardiac pacemaker: Secondary | ICD-10-CM

## 2016-08-26 DIAGNOSIS — I4819 Other persistent atrial fibrillation: Secondary | ICD-10-CM

## 2016-08-29 NOTE — Progress Notes (Signed)
[facility] TTM Report    Today, a TTM was performed on this patient.     [employer] Jude Nano, implanted 06/20/2014.    Normal pacing and battery function were observed.    PLAN: Monthly TTM due to advisory regarding premature battery depletion.

## 2016-09-23 ENCOUNTER — Ambulatory Visit (INDEPENDENT_AMBULATORY_CARE_PROVIDER_SITE_OTHER): Payer: Medicare Other | Admitting: Clinical Cardiac Electrophysiology

## 2016-09-23 DIAGNOSIS — Z950 Presence of cardiac pacemaker: Secondary | ICD-10-CM

## 2016-09-28 NOTE — Progress Notes (Signed)
[facility] TTM Report    Today, a TTM was performed on this patient.     [employer] Jude Nano, implanted 06/20/2014.    Normal pacing and battery function were observed.    PLAN: Monthly TTM due to advisory regarding premature battery depletion.

## 2016-10-06 ENCOUNTER — Other Ambulatory Visit: Payer: Medicare Other

## 2016-10-06 ENCOUNTER — Ambulatory Visit
Admission: RE | Admit: 2016-10-06 | Discharge: 2016-10-06 | Disposition: A | Discharge status: Home / Self Care | Payer: Medicare Other | Source: Ambulatory Visit | Attending: Internal Medicine | Admitting: Internal Medicine

## 2016-10-06 DIAGNOSIS — I482 Chronic atrial fibrillation: Secondary | ICD-10-CM | POA: Insufficient documentation

## 2016-10-06 LAB — PT/INR
PT INR: 2.3 — ABNORMAL HIGH (ref 0.9–1.1)
PT: 25.1 s — ABNORMAL HIGH (ref 12.6–15.0)

## 2016-10-07 ENCOUNTER — Telehealth (INDEPENDENT_AMBULATORY_CARE_PROVIDER_SITE_OTHER): Payer: Self-pay

## 2016-10-07 NOTE — Telephone Encounter (Signed)
I tried contacting patient regarding INR 10/06/16 of 2.3. No answer. I left a message on patient's v/m requesting he call me back at [phone] to discuss what he is currently taking so I can update his chart.

## 2016-10-21 ENCOUNTER — Ambulatory Visit (INDEPENDENT_AMBULATORY_CARE_PROVIDER_SITE_OTHER): Payer: Medicare Other | Admitting: Clinical Cardiac Electrophysiology

## 2016-10-21 DIAGNOSIS — I482 Chronic atrial fibrillation, unspecified: Secondary | ICD-10-CM

## 2016-10-21 DIAGNOSIS — Z950 Presence of cardiac pacemaker: Secondary | ICD-10-CM

## 2016-10-24 ENCOUNTER — Other Ambulatory Visit: Payer: Self-pay | Admitting: Pulmonary Disease

## 2016-10-24 ENCOUNTER — Ambulatory Visit
Admission: RE | Admit: 2016-10-24 | Discharge: 2016-10-24 | Disposition: A | Discharge status: Home / Self Care | Payer: Medicare Other | Source: Ambulatory Visit | Attending: Pulmonary Disease | Admitting: Pulmonary Disease

## 2016-10-24 DIAGNOSIS — R059 Cough, unspecified: Secondary | ICD-10-CM

## 2016-10-24 DIAGNOSIS — R911 Solitary pulmonary nodule: Secondary | ICD-10-CM

## 2016-10-24 DIAGNOSIS — J470 Bronchiectasis with acute lower respiratory infection: Secondary | ICD-10-CM

## 2016-10-24 DIAGNOSIS — J40 Bronchitis, not specified as acute or chronic: Secondary | ICD-10-CM

## 2016-10-24 DIAGNOSIS — R05 Cough: Secondary | ICD-10-CM | POA: Insufficient documentation

## 2016-10-24 DIAGNOSIS — J479 Bronchiectasis, uncomplicated: Secondary | ICD-10-CM | POA: Insufficient documentation

## 2016-10-25 NOTE — Progress Notes (Signed)
[facility] TTM Report    Today, a TTM was performed on this patient.     [employer] Jude Nano, implanted 06/20/2014.    Normal pacing and battery function were observed.    PLAN: Monthly TTM due to advisory regarding premature battery depletion.

## 2016-11-25 ENCOUNTER — Telehealth (INDEPENDENT_AMBULATORY_CARE_PROVIDER_SITE_OTHER): Payer: Medicare Other

## 2016-12-02 ENCOUNTER — Ambulatory Visit (INDEPENDENT_AMBULATORY_CARE_PROVIDER_SITE_OTHER): Payer: Medicare Other | Admitting: Clinical Cardiac Electrophysiology

## 2016-12-02 DIAGNOSIS — I482 Chronic atrial fibrillation, unspecified: Secondary | ICD-10-CM

## 2016-12-02 DIAGNOSIS — Z950 Presence of cardiac pacemaker: Secondary | ICD-10-CM

## 2016-12-05 ENCOUNTER — Other Ambulatory Visit
Admission: RE | Admit: 2016-12-05 | Discharge: 2016-12-05 | Disposition: A | Discharge status: Home / Self Care | Payer: Medicare Other | Source: Ambulatory Visit | Attending: Internal Medicine | Admitting: Internal Medicine

## 2016-12-05 DIAGNOSIS — I482 Chronic atrial fibrillation: Secondary | ICD-10-CM | POA: Insufficient documentation

## 2016-12-05 LAB — PT/INR
PT INR: 2.9 — ABNORMAL HIGH (ref 0.9–1.1)
PT: 30.2 s — ABNORMAL HIGH (ref 12.6–15.0)

## 2016-12-06 ENCOUNTER — Telehealth (INDEPENDENT_AMBULATORY_CARE_PROVIDER_SITE_OTHER): Payer: Self-pay

## 2016-12-06 NOTE — Telephone Encounter (Signed)
I called and l/m on v/m informing patient of INR from 12/05/16 of 2.9. I requested patient call me back at [phone] to let me know how much coumadin he is taking so that we can update his chart.

## 2016-12-14 ENCOUNTER — Ambulatory Visit
Admission: RE | Admit: 2016-12-14 | Discharge: 2016-12-14 | Disposition: A | Discharge status: Home / Self Care | Payer: Medicare Other | Source: Ambulatory Visit | Attending: Critical Care Medicine | Admitting: Critical Care Medicine

## 2016-12-14 DIAGNOSIS — J479 Bronchiectasis, uncomplicated: Secondary | ICD-10-CM | POA: Insufficient documentation

## 2016-12-14 LAB — IGM: Immunoglobulin M: 38 mg/dL (ref 22–293)

## 2016-12-14 LAB — IGA: Immunoglobulin A: 604 mg/dL (ref 101–645)

## 2016-12-14 LAB — IGG: Immunoglobulin G: 1103 mg/dL (ref 540–1822)

## 2016-12-14 NOTE — Progress Notes (Signed)
[facility] TTM Report    Today, a TTM was performed on this patient.     [employer] Jude Nano, implanted 06/20/2014.    Normal pacing and battery function were observed.    PLAN: Monthly TTM due to advisory regarding premature battery depletion.

## 2016-12-15 LAB — ANA SCREEN REFLEX

## 2016-12-18 LAB — ANA SCREEN, IFA, WITH REFLEX TO TITER AND PATTERN: ANA Screen, IFA: NEGATIVE

## 2016-12-21 LAB — IGG SUBCLASSES, S
IgG Subclass 1: 549 mg/dL (ref 382–929)
IgG Subclass 2: 278 mg/dL (ref 241–700)
IgG Subclass 3: 76 mg/dL (ref 22–178)
IgG Subclass 4: 13.4 mg/dL (ref 4.0–86.0)
Immunoglobulin G: 1124 mg/dL (ref 694–1618)

## 2016-12-21 LAB — IGE: Immunoglobulin E: <2 (ref <=114)

## 2016-12-22 ENCOUNTER — Encounter (INDEPENDENT_AMBULATORY_CARE_PROVIDER_SITE_OTHER): Payer: Medicare Other | Admitting: Physician Assistant

## 2016-12-23 ENCOUNTER — Encounter (INDEPENDENT_AMBULATORY_CARE_PROVIDER_SITE_OTHER): Payer: Self-pay | Admitting: Physician Assistant

## 2016-12-23 ENCOUNTER — Ambulatory Visit (INDEPENDENT_AMBULATORY_CARE_PROVIDER_SITE_OTHER): Payer: Medicare Other | Admitting: Physician Assistant

## 2016-12-23 VITALS — BP 126/78 | HR 80 | Ht 73.0 in | Wt 206.0 lb

## 2016-12-23 DIAGNOSIS — I482 Chronic atrial fibrillation, unspecified: Secondary | ICD-10-CM

## 2016-12-23 DIAGNOSIS — Z950 Presence of cardiac pacemaker: Secondary | ICD-10-CM

## 2016-12-23 DIAGNOSIS — R0609 Other forms of dyspnea: Secondary | ICD-10-CM

## 2016-12-23 DIAGNOSIS — R0600 Dyspnea, unspecified: Secondary | ICD-10-CM

## 2016-12-23 DIAGNOSIS — I499 Cardiac arrhythmia, unspecified: Secondary | ICD-10-CM

## 2016-12-23 DIAGNOSIS — I498 Other specified cardiac arrhythmias: Secondary | ICD-10-CM

## 2016-12-23 DIAGNOSIS — Z006 Encounter for examination for normal comparison and control in clinical research program: Secondary | ICD-10-CM

## 2016-12-23 NOTE — Progress Notes (Signed)
[facility]ICE VISIT    Jonathan [name] presents to the office today for leadless pacemaker site check.   He is a 66 yo male with chronic atrial fibrillation and slow ventricular response who underwent [employer] Nanostim leadless pacemaker implantation on 06/20/14.      CHA2DS2-VASc 5 for HTN, TIA, CAD, and age > 41.  He is anticoagulated with warfarin.     Jonathan [name] continues to complain of decreased energy with exercise despite several adjustments to his rate response settings with subsequent histograms showing improvement in heart rate variability.  He has a history of CAD and underwent repeat TTE and stress imaging 11/2015, both of which were normal.  Today he is requesting that we decrease his lower pacing rate to 50bpm.   He states that prior to pacemaker placement, he heart rates were typically in the 50s and he felt great. He has continues to swim regularly.  He denies wt changes, chest pain, palpitations, orthopnea, LE edema, lightheadedness, presyncope or syncope.      Given his DOE, he has pursued pulmonary consultation and recently underwent a bronchoscopy.  He does not have results as of yet.    TTE 11/2015 normal LV dimension, EF 55-60%, normal RV, mild BAE, trace MR/TR, mild ascending aorta dilation, 4.2 cm    Nuc ST 11/2015 (regadenoson) normal perfusion, no ischemia or scar      PMH:   Past Medical History:   Diagnosis Date   . Anxiety    . Atrial fibrillation 7/14 dx    holter7/14 rates 30-111, 05/2014 27-146, Avg 41  no pauses >2.5 sec. Holter 10/15 rates in 30s mostly 7 pm to 7 am, > 100 w exercise only   . Back pain     numbness in feet   . Bilirubinemia    . Bronchiectasis    . Cardiac arrhythmia    . Cold hands and feet    . Coronary artery disease 01/2014    mild inf ischemia   . Dyspnea on exertion    . History of basal cell cancer    . History of basal cell cancer    . Hyperlipidemia     pt. denies pain   . Hypertension    . Migraine equivalent    . Neck pain     numbness in hands   . Pacemaker    .  Sick sinus syndrome    . Thoracic aortic ectasia 4.30 January 2013, 4.2 12/2014   . TIA (transient ischemic attack)         MEDICATIONS:   Current Outpatient Prescriptions   Medication Sig Dispense Refill   . aspirin EC 81 MG EC tablet Take 81 mg by mouth daily.     . Fluticasone Propionate (FLONASE NA) 1 puff by Nasal route as needed.     Marland Kitchen guaiFENesin (ROBITUSSIN) 100 MG/5ML syrup Take 400 mg by mouth 2 (two) times daily as needed for Cough.     . losartan (COZAAR) 50 MG tablet TAKE 1 TABLET BY MOUTH EVERY DAY 90 tablet 3   . sertraline (ZOLOFT) 50 MG tablet 75mg  PO qam, 25mg  PO qpm (Patient taking differently: Take 25 mg by mouth daily.    ) 180 tablet 1   . warfarin (COUMADIN) 10 MG tablet Take 1 tablet (10 mg total) by mouth daily. Adjusted for inr 100 tablet 3   . ALPRAZolam (XANAX) 0.5 MG tablet Take 1 tablet (0.5 mg total) by mouth every 12 (twelve) hours as needed.  30 tablet 1     No current facility-administered medications for this visit.         SH:   Social History   Substance Use Topics   . Smoking status: Former Smoker     Packs/day: 1.00     Years: 10.00     Quit date: 03/28/1984   . Smokeless tobacco: Never Used   . Alcohol use Yes      Comment: occasionally       REVIEW OF SYSTEMS: As above per HPI    PHYSICAL EXAM:  Vitals:    12/23/16 1314   BP: 126/78   Pulse: 80   Weight: 93.4 kg (206 lb)   Height: 1.854 m (6\' 1" )     Gen:  well-developed, well-nourished 66 year old male, alert and oriented and in no acute distress  HEENT: normocephalic, atraumatic; sclera anicteric  Neck: good range of motion with trachea midline and no JVD appreciated  Chest:  good air entry bilaterally and lungs are clear to auscultation bilaterally  Heart: normal S1, S2; regular rate and rhythm; no significant murmur, rubs or gallops  Extremities: warm without edema       DEVICE INTERROGATION/REPROGRAMMING:    [employer] Nanostim leadless PM, implanted 06/20/14, set in the VVIR mode with lpr 60 bpm, max sensor rate 140 bpm.  95% V  paced.  Underlying rhythm AF with ventricular response high 40s-50 bpm.  R wave sensed at 6.5 mV.  Threshold 0.5 V at 0.4 ms, with stable trend.  Battery > 3.3 V, 9 years estimated longevity.  Long term ventricular heart rate histogram within normal range with good variability (HRs 60-130s).  No evidence of V high rates per histograms, though nano PM does not provide arrhythmia detection.    Per patient request, lower pacing rate decreased to 50bpm, rate responsiveness remains on.    IMPRESSION/RECOMMENDATIONS:   1. Chronic atrial fibrillation     2. Cardiac pacemaker in situ--leadless nano stim     3. Dyspnea on exertion     4. Slow ventricular response       -Normal SJM Nanostim leadless PM function  -Underlying rhythm AF with V-rate high 40s-50 bpm  -Anticoagulated with warfarin for AF  -RV-paced 94%, no signs/symptoms of HF, TTE 11/2015 LVEF 55-60%  -per patient request, lower pacing rate decreased to 50 bpm.  He understands that he will likely still pace a majority of the time.  -Pt's PM is under advisory for sudden early battery depletion.   This has affected < 1% of Nanostim devices worldwide.  All cases have occurred between 29-37 months post implant.   Pt is now >2.30yrs post-implant.  The recommend monitoring protocol at this time is monthly TTMs to check battery, and office PM checks every 6 months with a [employer] representative.  The advisory was discussed in detail and pt understands the monitoring protocol going forward.      -RTO 6 months with S[employer] representative present  -Monthly TTMs    ----------------------------  Ramon Dredge, PA-C  [facility]  Office [phone]      Incident to service performed with physician present in the office in accordance with this patient's established plan of care.

## 2017-01-18 ENCOUNTER — Other Ambulatory Visit (INDEPENDENT_AMBULATORY_CARE_PROVIDER_SITE_OTHER): Payer: Self-pay | Admitting: Internal Medicine

## 2017-01-18 DIAGNOSIS — I482 Chronic atrial fibrillation, unspecified: Secondary | ICD-10-CM

## 2017-01-18 DIAGNOSIS — I10 Essential (primary) hypertension: Secondary | ICD-10-CM

## 2017-01-20 MED ORDER — LOSARTAN POTASSIUM 50 MG PO TABS
ORAL_TABLET | ORAL | 0 refills | Status: DC
Start: 2017-01-20 — End: 2017-02-02

## 2017-01-20 MED ORDER — WARFARIN SODIUM 10 MG PO TABS
10.0000 mg | ORAL_TABLET | Freq: Every day | ORAL | 0 refills | Status: DC
Start: 2017-01-20 — End: 2017-02-02

## 2017-01-27 ENCOUNTER — Other Ambulatory Visit (INDEPENDENT_AMBULATORY_CARE_PROVIDER_SITE_OTHER): Payer: Self-pay | Admitting: Internal Medicine

## 2017-01-27 ENCOUNTER — Ambulatory Visit (INDEPENDENT_AMBULATORY_CARE_PROVIDER_SITE_OTHER): Payer: Medicare Other | Admitting: Clinical Cardiac Electrophysiology

## 2017-01-27 ENCOUNTER — Other Ambulatory Visit
Admission: RE | Admit: 2017-01-27 | Discharge: 2017-01-27 | Disposition: A | Discharge status: Home / Self Care | Payer: Medicare Other | Source: Ambulatory Visit | Attending: Internal Medicine | Admitting: Internal Medicine

## 2017-01-27 ENCOUNTER — Ambulatory Visit (INDEPENDENT_AMBULATORY_CARE_PROVIDER_SITE_OTHER): Payer: Self-pay

## 2017-01-27 DIAGNOSIS — Z950 Presence of cardiac pacemaker: Secondary | ICD-10-CM

## 2017-01-27 DIAGNOSIS — I482 Chronic atrial fibrillation, unspecified: Secondary | ICD-10-CM

## 2017-01-27 DIAGNOSIS — I4891 Unspecified atrial fibrillation: Secondary | ICD-10-CM

## 2017-01-27 DIAGNOSIS — Z45010 Encounter for checking and testing of cardiac pacemaker pulse generator [battery]: Secondary | ICD-10-CM

## 2017-01-27 LAB — PT/INR
PT INR: 2.5 — ABNORMAL HIGH (ref 0.9–1.1)
PT: 26.9 s — ABNORMAL HIGH (ref 12.6–15.0)

## 2017-01-27 NOTE — Progress Notes (Signed)
Diagnosis:  atrial fibrillation/flutter  INR range:  2.0-3.0  Primary cardiologist:  Janace Litten, MD  Attention:   ______________________________    INR = 2.5, date: Jun 29 , day: Friday  Laboratory:  [facility]    Comment:  I tried contacting patient, no answer. Unsuccessful at reaching patient since having INR's checked by CF. Raiford Noble, RN able to reach patient when he requested med refill at which time patient informed Raiford Noble he was taking 10 mg qd.      New dose?  No    Glynis Smiles Tue Wed Thu Fri Sat   10 10 10 10 10 10 10      Next INR:  1 month(s)    Billing:     Last billed:  ***  (Code G0250 for RCS, MDINR, Coagucheck, Alere)   (Code 16109 for first 90 days and 99364 for subsequent 90 days)  _______________________________

## 2017-02-02 ENCOUNTER — Ambulatory Visit (INDEPENDENT_AMBULATORY_CARE_PROVIDER_SITE_OTHER): Payer: Medicare Other | Admitting: Internal Medicine

## 2017-02-02 ENCOUNTER — Other Ambulatory Visit (INDEPENDENT_AMBULATORY_CARE_PROVIDER_SITE_OTHER): Payer: Self-pay

## 2017-02-02 ENCOUNTER — Ambulatory Visit (INDEPENDENT_AMBULATORY_CARE_PROVIDER_SITE_OTHER): Payer: Self-pay

## 2017-02-02 ENCOUNTER — Ambulatory Visit
Admission: RE | Admit: 2017-02-02 | Discharge: 2017-02-02 | Disposition: A | Discharge status: Home / Self Care | Payer: Medicare Other | Source: Ambulatory Visit | Attending: Internal Medicine | Admitting: Internal Medicine

## 2017-02-02 ENCOUNTER — Encounter (INDEPENDENT_AMBULATORY_CARE_PROVIDER_SITE_OTHER): Payer: Self-pay | Admitting: Internal Medicine

## 2017-02-02 VITALS — BP 132/81 | HR 60 | Wt 202.0 lb

## 2017-02-02 DIAGNOSIS — Z950 Presence of cardiac pacemaker: Secondary | ICD-10-CM

## 2017-02-02 DIAGNOSIS — I10 Essential (primary) hypertension: Secondary | ICD-10-CM

## 2017-02-02 DIAGNOSIS — I482 Chronic atrial fibrillation, unspecified: Secondary | ICD-10-CM

## 2017-02-02 DIAGNOSIS — Z7901 Long term (current) use of anticoagulants: Secondary | ICD-10-CM

## 2017-02-02 DIAGNOSIS — I4891 Unspecified atrial fibrillation: Secondary | ICD-10-CM

## 2017-02-02 MED ORDER — WARFARIN SODIUM 10 MG PO TABS
10.0000 mg | ORAL_TABLET | Freq: Every day | ORAL | 0 refills | Status: DC
Start: 2017-02-02 — End: 2017-02-06

## 2017-02-02 MED ORDER — LOSARTAN POTASSIUM 50 MG PO TABS
ORAL_TABLET | ORAL | 3 refills | Status: DC
Start: 2017-02-02 — End: 2017-02-06

## 2017-02-02 MED ORDER — WARFARIN SODIUM 10 MG PO TABS
10.0000 mg | ORAL_TABLET | Freq: Every day | ORAL | 2 refills | Status: DC
Start: 2017-02-02 — End: 2017-02-06

## 2017-02-02 MED ORDER — LOSARTAN POTASSIUM 50 MG PO TABS
ORAL_TABLET | ORAL | 2 refills | Status: DC
Start: 2017-02-02 — End: 2017-02-06

## 2017-02-02 NOTE — Progress Notes (Signed)
[facility] OFFICE VISIT      Chief Complaint   Patient presents with   . Atrial Fibrillation       I had the pleasure of seeing Jonathan [name] today for cardiovascular follow up. He is a pleasant 66 y.o. male with a history of chronic atrial fibrillation and permanent pacemaker  who presents for continued management.  Currently on warfarin.  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 )      MEDICATIONS:     Current Outpatient Prescriptions   Medication Sig Dispense Refill   . ALPRAZolam (XANAX) 0.5 MG tablet Take 1 tablet (0.5 mg total) by mouth every 12 (twelve) hours as needed. 30 tablet 1   . aspirin EC 81 MG EC tablet Take 81 mg by mouth daily.     . Fluticasone Propionate (FLONASE NA) 1 puff by Nasal route as needed.     . GUAIFENESIN PO Take 400 mg by mouth 2 (two) times daily.     Marland Kitchen losartan (COZAAR) 50 MG tablet TAKE 1 TABLET BY MOUTH EVERY DAY 30 tablet 0   . sertraline (ZOLOFT) 50 MG tablet 75mg  PO qam, 25mg  PO qpm (Patient taking differently: Take 25 mg by mouth daily.    ) 180 tablet 1   . warfarin (COUMADIN) 10 MG tablet Take 1 tablet (10 mg total) by mouth daily.Adjusted for inr 30 tablet 0     No current facility-administered medications for this visit.        REVIEW OF SYSTEMS: All other systems reviewed and negative except as above.    PHYSICAL EXAMINATION  Vital Signs: BP 132/81 (BP Site: Left arm, Patient Position: Sitting, Cuff Size: Large)   Pulse 60   Wt 91.6 kg (202 lb) Comment: per PT  BMI 26.65 kg/m    Vital signs reviewed    Wt Readings from Last 3 Encounters:   02/02/17 91.6 kg (202 lb)   12/23/16 93.4 kg (206 lb)   06/16/16 90.7 kg (200 lb)        General Appearance:  A well-appearing male in no acute distress.    HEENT: Sclera anicteric, conjunctiva without pallor, moist mucous membranes, normal dentition.   Neck:  Supple without jugular venous distention.  Normal carotid upstrokes without bruits.   Chest: Clear to auscultation bilaterally  with good air movement and respiratory effort and no wheezes, rales, or rhonchi   Cardiac: Irreg, Irreg rate and rhythm with variable S1 and physiologically split S2, without gallops or rub. No murmurs.  PMI of normal size and nondisplaced.   Vascular:  2+ carotid, radial, and distal pulses bilaterally  Abdomen: Soft, nontender, nondistended, with normoactive bowel sounds.  No bruits.   Extremities: Warm without edema, clubbing, or cyanosis.   Skin: No rash, warm, appropriate for race.   Neuro: Alert and oriented x3. Grossly intact.  CN II-XII intact.  Normal mood and affect.     ECG:   Independent review shows: Atrial Fibrillation with demand ventricular pacing      ASSESSMENT/PLAN:  Chronic Atrial Fibrillation  Demand Ventricular Pacing     Pre mature batter depletion being followed   Asymptomatic from a cardiac standpoint    Plan    Continue current Rx    Janace Litten, MD  02/02/2017

## 2017-02-06 ENCOUNTER — Encounter (INDEPENDENT_AMBULATORY_CARE_PROVIDER_SITE_OTHER): Payer: Self-pay | Admitting: Family Medicine

## 2017-02-06 ENCOUNTER — Ambulatory Visit (INDEPENDENT_AMBULATORY_CARE_PROVIDER_SITE_OTHER): Payer: Medicare Other | Admitting: Family Medicine

## 2017-02-06 VITALS — BP 123/73 | HR 58 | Temp 97.4°F | Ht 73.0 in | Wt 205.0 lb

## 2017-02-06 DIAGNOSIS — F329 Major depressive disorder, single episode, unspecified: Secondary | ICD-10-CM

## 2017-02-06 DIAGNOSIS — I10 Essential (primary) hypertension: Secondary | ICD-10-CM

## 2017-02-06 DIAGNOSIS — I482 Chronic atrial fibrillation, unspecified: Secondary | ICD-10-CM

## 2017-02-06 DIAGNOSIS — R0609 Other forms of dyspnea: Secondary | ICD-10-CM

## 2017-02-06 DIAGNOSIS — R0600 Dyspnea, unspecified: Secondary | ICD-10-CM

## 2017-02-06 DIAGNOSIS — F32A Depression, unspecified: Secondary | ICD-10-CM

## 2017-02-06 MED ORDER — WARFARIN SODIUM 10 MG PO TABS
10.0000 mg | ORAL_TABLET | Freq: Every evening | ORAL | 3 refills | Status: DC
Start: 2017-02-06 — End: 2018-04-23

## 2017-02-06 MED ORDER — LOSARTAN POTASSIUM 50 MG PO TABS
50.0000 mg | ORAL_TABLET | Freq: Every evening | ORAL | 3 refills | Status: DC
Start: 2017-02-06 — End: 2018-03-14

## 2017-02-06 MED ORDER — SERTRALINE HCL 50 MG PO TABS
50.0000 mg | ORAL_TABLET | Freq: Every day | ORAL | 2 refills | Status: DC
Start: 2017-02-06 — End: 2018-02-03

## 2017-02-06 NOTE — Progress Notes (Signed)
Have you seen any specialists/other providers since your last visit with [place]?      Yes Cardiology,Pulmonary      Arm preference verified?     Yes    The patient is due for colonoscopy, spirometry, pneumonia vaccine and pcmh,shingrix,medicare wellness

## 2017-02-07 ENCOUNTER — Encounter (INDEPENDENT_AMBULATORY_CARE_PROVIDER_SITE_OTHER): Payer: Self-pay | Admitting: Family Medicine

## 2017-02-07 LAB — CARDIO IQ(R) NT-PROBNP (SOFT): NT-proBNP: 1185 pg/mL

## 2017-02-07 LAB — B-TYPE NATRIURETIC PEPTIDE: B-Natriuretic Peptide: 212 pg/mL — ABNORMAL HIGH (ref 0–100)

## 2017-02-07 NOTE — Progress Notes (Signed)
Subjective:      Patient ID: Jonathan [name] is a 66 y.o. male     Chief Complaint   Patient presents with   . Depression     refill pt takes medication as directed medication effective denies side effects        HPI     1. HTN: on losartan 50mg  daily. Tolerating without side effects. Denies chest pain, dizziness, SOB, BLE swelling.     2. A-fib: chronic. Diagnosed about 4 years ago. When he thinks back to his symptoms, he thinks he may have been having a-fib years before it was diagnosed. He's on coumadin 10mg  daily. INR followed by cardiology. He follows cardiology for his a-fib, CAD. Of note, PMHx significant for sick sinus syndrome as well; s/p pacemaker placement.     3. Anxiety/depressed mood: on zoloft 25mg  BID. Tolerating medication w/o problems. 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 Takes xanax rarely - just a tablet or two over the last year.     4. Dyspnea: worsens with exertion. Chronic. Was wondering if he could have BNP and pro BNP lab tests ordered. He had lab work done for [employer] [facility] [employer] and a pro BNP test was ordered and was elevated to 600s. This was the first time he'd heard of this test. Cardiology tried, and he had blood work collected 7/5/8, but review of the chart shows that the test was canceled - reasoning states lab test wasn't received at the lab within 5 hours after collection.     The following sections were reviewed this encounter by the provider:   Allergies  Meds  Problems  Med Hx         Review of Systems   Constitutional: Negative for activity change.   Eyes: Negative for visual disturbance.   Respiratory: Positive for shortness of breath (chronic. worsens with exertion). Negative for cough.    Cardiovascular: Negative for chest pain, palpitations and leg swelling.   Musculoskeletal: Negative for myalgias.   Neurological:  Negative for dizziness, facial asymmetry, speech difficulty and weakness.   Psychiatric/Behavioral: Negative for dysphoric mood, self-injury, sleep disturbance and suicidal ideas. The patient is not nervous/anxious.           BP 123/73 (BP Site: Right arm, Patient Position: Sitting, Cuff Size: Large)   Pulse (!) 58   Temp 97.4 F (36.3 C) (Oral)   Ht 1.854 m (6\' 1" )   Wt 93 kg (205 lb)   BMI 27.05 kg/m     Objective:     Physical Exam   Constitutional: He is oriented to person, place, and time. He appears well-developed and well-nourished. No distress.   Eyes: Conjunctivae are normal. No scleral icterus.   Neck: Neck supple.   Cardiovascular: Normal rate, regular rhythm and normal heart sounds.    No murmur heard.  Pulmonary/Chest: Effort normal and breath sounds normal. No respiratory distress.   Musculoskeletal: He exhibits no edema.   Neurological: He is alert and oriented to person, place, and time. He exhibits normal muscle tone.   Vitals reviewed.       Assessment:     1. Essential hypertension  - losartan (COZAAR) 50 MG tablet; Take 1 tablet (50 mg total) by mouth nightly.  Dispense: 90 tablet; Refill: 3  - B-type natriuretic peptide  - Cardio IQ(R) NT-proBNP  2. Chronic atrial fibrillation  - warfarin (COUMADIN) 10 MG tablet; Take 1 tablet (10 mg total) by mouth nightly.Adjusted for inr  Dispense: 90 tablet; Refill: 3  - B-type natriuretic peptide  - Cardio IQ(R) NT-proBNP    3. Dyspnea on exertion  - B-type natriuretic peptide  - Cardio IQ(R) NT-proBNP    4. Depression, unspecified depression type  - sertraline (ZOLOFT) 50 MG tablet; Take 1 tablet (50 mg total) by mouth daily.  Dispense: 90 tablet; Refill: 2        Plan:     Chronic. Controlled on current regimen. Continue losartan 50mg  daily.     Chronic. Stable. S/p pacemaker for sick sinus syndrome. Thus in regular rhythm on ascultation. On no rate controlling medicine because rate is controlled wit pacemaker. On coumadin for anticoagulation.  INR followed by cardiology.     Chronic. Likely multifactorial and secondary to chronic lung disease of bronchiectasis, cardiomyopathy - patient with chronic a-fib, CAD. F/u BNP, pro BNP. Continue to follow with cardiology.     Chronic. Stable and improved with zoloft. Continue medication. Wean down to 25mg  once daily in a few months as tolerated.     Emelda Brothers, MD

## 2017-02-07 NOTE — Patient Instructions (Signed)
Discharge Instructions for Cardiomyopathy  Cardiomyopathymeans that your heart is not working as itnormally should. This condition can make it more difficult to do things that may have been easy for you in the past. But with proper treatment and some lifestyle changes,you and your healthcare provider can help your heart do its job.  Home care  Work hard to remove the salt from your diet. Here are tips:   Limit canned, dried, packaged, and fast foods.   Don't add salt to your food at the table.   Season foods with herbs instead of salt when you cook.   When you eat out, ask that the chef not add any salt to your dish.   Don't eat fried or greasy foods.   Be careful of bottled beverages. They can contain a lot of salt.  Also check the labels of over-the-counter medicines and supplements. They may be high in sodium. Ask your pharmacist or provider if you need help finding a low-salt product.  Be as active as you can. Ask your healthcare provider how to get started:   Simple activities such as walking or gardening can help.   Find activities you enjoy and make them a priority.   Cardiac rehabilitation programs can help you reach your activity goals. You exercise while staff closely watches the stress on your heart. These programs may be covered by insurance.  Other tips for home care:   Limit how much fluid you have each day. Your healthcare provider will tell you how much is safe.   Break the smoking habit. Enroll in a stop-smoking program to improve your chances of success. Join smoking cessation support groups or ask your healthcare provider about nicotine replacement products.   Take your medicines exactly as directed. Don't skip doses. Don't stop taking your medicines without talking to your healthcare provider first.   Some over-the-counter medicines and herbal supplements can increase your heart rate or blood pressure. This can put extra stress on your heart. Check with your pharmacist to see if  products are heart-safe and won't interact with other medicines you take.   Visit your healthcare provider regularly. Mention any problems with your treatment plan. Together you can find a plan that works for you.   Weigh yourself at the same time each day. The best time is in the morning after you wake up and after urinating. Wear the same clothing each time. Keep a written record of your daily weight.   Limit how much alcohol you drink. Too much alcohol isn't good for the heart. Healthcare providers advise no more than 1 drink per day for women and 2 drinks per day for men.  Follow-up care  Make a follow-up appointment as directed by our staff.    When to call your healthcare provider  Call your healthcare provider right away if you have any of the following:   You gain more than 2 pounds in 1 day, more than 5 pounds in 1 week, or whatever weight gain you were told to report by your healthcare provider   New or increasedchest pain that doesn't get better with medicine   New or increasedshortness of breath or coughing   Weakness in the muscles of your face, arms, or legs   Trouble speaking   Rapid pulse or pounding heartbeat   Fainting, or feeling dizzy or lightheaded   New or increased swelling in your hands, feet, or ankles   Date Last Reviewed: 09/02/2015   2000-2017 [employer] [address] [place], [place] [address]. All rights reserved. This information is not intended as a substitute for professional medical care. Always follow your healthcare professional's instructions.        BNP (Blood)  Does this test have other names?  B-type natriuretic peptide  What is this test?  This test looks for the hormone BNP in your blood. BNP stands for brain natriuretic peptide. Itis made inside the pumping chambers of your heart when pressure builds up from heart failure. The test is an important tool for healthcare providers to diagnose heart failure quickly.  Heart failure happens when your  heart is not pumping blood well. This causes cells inside your heart to release BNP. This opens up blood vessels in your body to take pressure off your heart. A BNP blood test correctly shows heart failure about 90% of the time.  The BNP test can help your healthcare provider diagnose heart failure, plan treatment, see how well the treatment is working, and figure out when it is safe for you to leave the hospital. The BNP test can show how serious your heart failure is now and how severe your heart failure will be in the future. A BNP test is quite accurate and it only takes about 15 minutes to get the results.  Why do I need this test?  You may need this test if your healthcare provider suspects that you have heart failure.  The main symptom of heart failure is difficulty breathing (dyspnea). If you go to your healthcare provider's office or the emergency room with trouble breathing, your provider will want to know the cause as quickly as possible. Many conditions can cause breathing difficulties, but if you also have a blood test that is positive for BNP, heart failure is likely causing your symptoms.  You may also need this test so that your healthcare provider can see how wellyour heart failure therapy is working.  What other tests might I have along with this test?  You may have a blood test called atrial natriuretic peptide (ANP). ANP is a hormone similar to BNP, but it is made in a different part of the heart. You may also have other blood tests, a chest X-ray, an electrocardiogram, or an echocardiogram, which is an ultrasound of the heart.  What do my test results mean?  Test results may vary depending on your age, gender, health history, the method used for the test, and other things. Your test results may not mean you have a problem. Ask your healthcare provider what your test results mean for you.  BNP is measured in picograms per milliliter (pg/mL) or nanograms per liter (ng/L). In general, the more  serious your heart failure, the higher your levels of BNP will be. But test results vary by age, sex, and body mass index.Normal values tend to go up with age. They also tend to be higher in women and lower in men. Both men and women who are obese tend to have lower levels.  How is this test done?  The test is done with a blood sample. A needle is used to draw blood from a vein in your arm or hand.  Does this test pose any risks?  Having a blood test with a needle carries some risks. These include bleeding, infection, bruising, and feeling lightheaded. When the needle pricks your arm or hand, you may feel a slight sting or pain. Afterward, the site may be sore.  What might affect my  test results?  Other things besides heart failure can cause your BNP to rise, including:   Kidney failure or being on dialysis   Long-term, or chronic, heart failure   Nesiritide, a synthetic form of BNP used to treat heart failure  How do I get ready for this test?  You don't need to prepare for this test.Be sure your healthcare provider knows about all medicines, herbs, vitamins, and supplements you are taking. This includes medicines that don't need a prescription and any illicit drugs you may use.   2000-2017 [employer] [place]. [address] [place], [place] [address]. All rights reserved. This information is not intended as a substitute for professional medical care. Always follow your healthcare professional's instructions.

## 2017-02-12 ENCOUNTER — Encounter (INDEPENDENT_AMBULATORY_CARE_PROVIDER_SITE_OTHER): Payer: Self-pay | Admitting: Internal Medicine

## 2017-02-15 NOTE — Progress Notes (Signed)
[facility] TTM Report    Today, a TTM was performed on this patient.     [employer] Jude Nano, implanted 06/20/2014.    Normal pacing and battery function were observed.    PLAN: Monthly TTM due to advisory regarding premature battery depletion.

## 2017-02-22 ENCOUNTER — Encounter (INDEPENDENT_AMBULATORY_CARE_PROVIDER_SITE_OTHER): Payer: Self-pay | Admitting: Internal Medicine

## 2017-02-24 ENCOUNTER — Telehealth (INDEPENDENT_AMBULATORY_CARE_PROVIDER_SITE_OTHER): Payer: Medicare Other

## 2017-03-22 ENCOUNTER — Encounter (INDEPENDENT_AMBULATORY_CARE_PROVIDER_SITE_OTHER): Payer: Self-pay | Admitting: Family Medicine

## 2017-03-24 ENCOUNTER — Telehealth (INDEPENDENT_AMBULATORY_CARE_PROVIDER_SITE_OTHER): Payer: Medicare Other

## 2017-03-24 ENCOUNTER — Ambulatory Visit (INDEPENDENT_AMBULATORY_CARE_PROVIDER_SITE_OTHER): Payer: Medicare Other | Admitting: Family Medicine

## 2017-03-24 ENCOUNTER — Encounter (INDEPENDENT_AMBULATORY_CARE_PROVIDER_SITE_OTHER): Payer: Self-pay | Admitting: Family Medicine

## 2017-03-24 ENCOUNTER — Ambulatory Visit (INDEPENDENT_AMBULATORY_CARE_PROVIDER_SITE_OTHER): Payer: Medicare Other | Admitting: Clinical Cardiac Electrophysiology

## 2017-03-24 VITALS — BP 123/85 | HR 66 | Temp 97.5°F | Ht 73.0 in | Wt 215.0 lb

## 2017-03-24 DIAGNOSIS — Z7184 Encounter for health counseling related to travel: Secondary | ICD-10-CM

## 2017-03-24 DIAGNOSIS — Z7189 Other specified counseling: Secondary | ICD-10-CM

## 2017-03-24 DIAGNOSIS — Z950 Presence of cardiac pacemaker: Secondary | ICD-10-CM

## 2017-03-24 DIAGNOSIS — Z23 Encounter for immunization: Secondary | ICD-10-CM

## 2017-03-24 MED ORDER — MEFLOQUINE HCL 250 MG PO TABS
250.0000 mg | ORAL_TABLET | ORAL | 0 refills | Status: DC
Start: 2017-03-25 — End: 2018-03-30

## 2017-03-24 NOTE — Progress Notes (Signed)
Have you seen any specialists/other providers since your last visit with [place]?      No      Arm preference verified?     Yes    The patient is due for colonoscopy, spirometry, pneumonia vaccine and pcmh,shingrix,medicare wellness

## 2017-03-27 NOTE — Progress Notes (Signed)
Subjective:      Patient ID: Jonathan [name] is a 66 y.o. male     Chief Complaint   Patient presents with   . Immunizations     pt requesting a HEP B vaccination         HPI     Presents for     Travel advice. He will be traveling with friends on a medical mission trip to the [place]. He's reviewed CDC travel website and states he needs Hep B vaccine. He's also interested in taking Malaria prophylaxis.     The following sections were reviewed this encounter by the provider:   Allergies  Meds  Problems         Review of Systems   Constitutional: Negative for activity change.   Eyes: Negative for visual disturbance.   Respiratory: Negative for cough and shortness of breath.    Cardiovascular: Negative for chest pain and palpitations.   Musculoskeletal: Negative for myalgias.   Neurological: Negative for dizziness, facial asymmetry, speech difficulty and weakness.          BP 123/85 (BP Site: Right arm, Patient Position: Sitting, Cuff Size: Medium)   Pulse 66   Temp 97.5 F (36.4 C) (Oral)   Ht 1.854 m (6\' 1" )   Wt 97.5 kg (215 lb)   BMI 28.37 kg/m     Objective:     Physical Exam   Constitutional: He is oriented to person, place, and time. He appears well-developed and well-nourished. No distress.   Eyes: Conjunctivae are normal. No scleral icterus.   Neck: Neck supple.   Cardiovascular: Normal rate, regular rhythm and normal heart sounds.    No murmur heard.  Pulmonary/Chest: Effort normal and breath sounds normal. No respiratory distress.   Musculoskeletal: He exhibits no edema.   Neurological: He is alert and oriented to person, place, and time. He exhibits normal muscle tone.   Vitals reviewed.       Assessment:     1. Travel advice encounter  - mefloquine (LARIAM) 250 MG tablet; Take 1 tablet (250 mg total) by mouth Once each week on Saturday evening.1 week before travel, weekly while away, for 4 weeks after return.  Dispense: 6 tablet; Refill: 0  - Hepatitis B vaccine adult IM    2. Need for  hepatitis B vaccination  - Hepatitis B vaccine adult IM        Plan:     Hep B #1 given. Mefloquine malaria prophylaxis prescribed. Safe travel tips discussed. Discussed with patient getting flu vaccine prior to travel in the next few weeks after the flu season starts.     Emelda Brothers, MD

## 2017-04-07 NOTE — Progress Notes (Signed)
[facility] TTM Report    Today, a TTM was performed on this patient.     [employer] Jude Nano, implanted 06/20/2014.    Normal pacing and battery function were observed.    PLAN: Monthly TTM due to advisory regarding premature battery depletion.

## 2017-04-08 ENCOUNTER — Emergency Department: Payer: Medicare Other

## 2017-04-08 ENCOUNTER — Emergency Department
Admission: EM | Admit: 2017-04-08 | Discharge: 2017-04-08 | Disposition: A | Discharge status: Home / Self Care | Payer: Medicare Other | Attending: Emergency Medicine | Admitting: Emergency Medicine

## 2017-04-08 DIAGNOSIS — E785 Hyperlipidemia, unspecified: Secondary | ICD-10-CM | POA: Insufficient documentation

## 2017-04-08 DIAGNOSIS — S2232XA Fracture of one rib, left side, initial encounter for closed fracture: Secondary | ICD-10-CM

## 2017-04-08 DIAGNOSIS — Z8249 Family history of ischemic heart disease and other diseases of the circulatory system: Secondary | ICD-10-CM | POA: Insufficient documentation

## 2017-04-08 DIAGNOSIS — Z8673 Personal history of transient ischemic attack (TIA), and cerebral infarction without residual deficits: Secondary | ICD-10-CM | POA: Insufficient documentation

## 2017-04-08 DIAGNOSIS — I10 Essential (primary) hypertension: Secondary | ICD-10-CM | POA: Insufficient documentation

## 2017-04-08 DIAGNOSIS — Z85828 Personal history of other malignant neoplasm of skin: Secondary | ICD-10-CM | POA: Insufficient documentation

## 2017-04-08 DIAGNOSIS — I495 Sick sinus syndrome: Secondary | ICD-10-CM | POA: Insufficient documentation

## 2017-04-08 DIAGNOSIS — S300XXA Contusion of lower back and pelvis, initial encounter: Secondary | ICD-10-CM | POA: Insufficient documentation

## 2017-04-08 DIAGNOSIS — Z7901 Long term (current) use of anticoagulants: Secondary | ICD-10-CM | POA: Insufficient documentation

## 2017-04-08 DIAGNOSIS — I4891 Unspecified atrial fibrillation: Secondary | ICD-10-CM | POA: Insufficient documentation

## 2017-04-08 DIAGNOSIS — Z7982 Long term (current) use of aspirin: Secondary | ICD-10-CM | POA: Insufficient documentation

## 2017-04-08 DIAGNOSIS — Z87891 Personal history of nicotine dependence: Secondary | ICD-10-CM | POA: Insufficient documentation

## 2017-04-08 DIAGNOSIS — W109XXA Fall (on) (from) unspecified stairs and steps, initial encounter: Secondary | ICD-10-CM | POA: Insufficient documentation

## 2017-04-08 DIAGNOSIS — Z79899 Other long term (current) drug therapy: Secondary | ICD-10-CM | POA: Insufficient documentation

## 2017-04-08 DIAGNOSIS — S0990XA Unspecified injury of head, initial encounter: Secondary | ICD-10-CM | POA: Insufficient documentation

## 2017-04-08 DIAGNOSIS — Z950 Presence of cardiac pacemaker: Secondary | ICD-10-CM | POA: Insufficient documentation

## 2017-04-08 DIAGNOSIS — J479 Bronchiectasis, uncomplicated: Secondary | ICD-10-CM | POA: Insufficient documentation

## 2017-04-08 DIAGNOSIS — I2510 Atherosclerotic heart disease of native coronary artery without angina pectoris: Secondary | ICD-10-CM | POA: Insufficient documentation

## 2017-04-08 MED ORDER — OXYCODONE-ACETAMINOPHEN 5-325 MG PO TABS
1.0000 | ORAL_TABLET | ORAL | 0 refills | Status: DC | PRN
Start: 2017-04-08 — End: 2017-04-10

## 2017-04-08 MED ORDER — OXYCODONE-ACETAMINOPHEN 5-325 MG PO TABS
2.0000 | ORAL_TABLET | Freq: Once | ORAL | Status: AC
Start: 2017-04-08 — End: 2017-04-08
  Administered 2017-04-08: 16:00:00 2 via ORAL
  Filled 2017-04-08: qty 2

## 2017-04-08 MED ORDER — NALOXONE HCL 4 MG/0.1ML NA LIQD
NASAL | 0 refills | Status: DC
Start: 2017-04-08 — End: 2017-11-17

## 2017-04-08 NOTE — ED Provider Notes (Signed)
Physician/Midlevel provider first contact with patient: 04/08/17 1512         History     Chief Complaint   Patient presents with   . Fall   . Back Pain     Pt c/o mid and low back pain s/p fall at 0930 this morning.  States he was walking down the basement stairs, they were damp at the bottom due to heavy rain, slipped and fell onto his back.  Also hit the back of his head upon impact.  No LOC.  States was able to get up and come back upstairs.  Placed ice on his back right away and has been resting all day but the pain in his mid back is continuing to get worse.  States if he sits still and hunches forward a little the pain is not too bad but gets much worse with any movement or deep breathing.  Denies HA, dizziness, vision changes, neck pain, focal/extremity weakness/numbness/tingling, hematuria, loss of bowel/bladder control, N/V, CP, SOB.  Takes warfarin for afib.  H/o bronchiectasis and pacemaker.       The history is provided by the patient.   Fall   The accident occurred 3 to 5 hours ago. Fall occurred: down stairs. He fell from a height of 1 to 2 ft. He landed on carpet. Point of impact: back, head. Pain location: midline/left mid thoracic area, left lower lumbar. The pain is at a severity of 10/10. He was ambulatory at the scene. Pertinent negatives include no visual change, no fever, no numbness, no abdominal pain, no bowel incontinence, no nausea, no vomiting, no hematuria, no headaches, no hearing loss, no loss of consciousness and no tingling. The symptoms are aggravated by pressure on the injury, standing, rotation and activity. He has tried rest and ice for the symptoms. The treatment provided mild relief.        Nursing (triage) note reviewed for the following pertinent information:  [place] around 0930 this morning fell on his back. Also, hit the back of his head. No LOC. on Blood thinner warfarin.     Past Medical History:   Diagnosis Date   . Anxiety    . Atrial  fibrillation 7/14 dx    holter7/14 rates 30-111, 05/2014 27-146, Avg 41  no pauses >2.5 sec. Holter 10/15 rates in 30s mostly 7 pm to 7 am, > 100 w exercise only   . Back pain     numbness in feet   . Bilirubinemia    . Bronchiectasis    . Cold hands and feet    . Coronary artery disease 01/2014    mild inf ischemia   . Dyspnea on exertion    . History of basal cell cancer    . Hyperlipidemia    . Hypertension    . Migraine headache    . Neck pain     numbness in hands   . Pacemaker    . Sick sinus syndrome     s/p pacemaker placement   . Thoracic aortic ectasia 4.30 January 2013, 4.2 12/2014   . TIA (transient ischemic attack)        Past Surgical History:   Procedure Laterality Date   . CARDIAC CATHETERIZATION  04/2014    50% lad, tight origin of D1   . CARDIAC PACEMAKER PLACEMENT         Family History   Problem Relation Age of Onset   . Hypertension Mother    .  Aplastic anemia Mother    . Stroke Father    . Diabetes Father    . Heart disease Father         pacemaker,CAD   . COPD Father    . Leukemia Sister         cml   . Depression Sister    . Anxiety disorder Sister    . Obesity Daughter    . Heart attack Paternal Uncle    . Heart attack Paternal Grandfather    . Depression Sister    . Anxiety disorder Sister    . Sleep apnea Brother    . Cancer Brother         basal cell   . Heart attack Paternal Aunt        Social  Social History   Substance Use Topics   . Smoking status: Former Smoker     Packs/day: 1.00     Years: 10.00     Quit date: 03/28/1984   . Smokeless tobacco: Never Used   . Alcohol use Yes      Comment: occasionally       .     No Known Allergies    Home Medications     Med List Status:  In Progress Set By: Darden Dates, RN at 04/08/2017  3:12 PM                ALPRAZolam (XANAX) 0.5 MG tablet     Take 1 tablet (0.5 mg total) by mouth every 12 (twelve) hours as needed.     aspirin EC 81 MG EC tablet     Take 81 mg by mouth daily.     Fluticasone Propionate (FLONASE NA)     1 puff by Nasal route as  needed.     GUAIFENESIN PO     Take 400 mg by mouth 2 (two) times daily.     losartan (COZAAR) 50 MG tablet     Take 1 tablet (50 mg total) by mouth nightly.     mefloquine (LARIAM) 250 MG tablet     Take 1 tablet (250 mg total) by mouth Once each week on Saturday evening.1 week before travel, weekly while away, for 4 weeks after return.     sertraline (ZOLOFT) 50 MG tablet     Take 1 tablet (50 mg total) by mouth daily.     warfarin (COUMADIN) 10 MG tablet     Take 1 tablet (10 mg total) by mouth nightly.Adjusted for inr           Review of Systems   Constitutional: Negative for fever.   Gastrointestinal: Negative for abdominal pain, bowel incontinence, nausea and vomiting.   Genitourinary: Negative for hematuria.   Neurological: Negative for tingling, loss of consciousness, numbness and headaches.   All other systems reviewed and are negative.      Physical Exam    BP: 161/88, Heart Rate: 76, Temp: 97.9 F (36.6 C), Resp Rate: 21, SpO2: 98 %, Weight: 97.5 kg    Physical Exam   Constitutional: He is oriented to person, place, and time. He appears well-developed and well-nourished. He appears distressed (moderate, appears to be in pain).   HENT:   Head: Normocephalic.   Mouth/Throat: Oropharynx is clear and moist.   No cranial deformity, step-off, crepitus, hematoma, tenderness   Eyes: Pupils are equal, round, and reactive to light. Conjunctivae and EOM are normal.   Neck: Normal range of motion. Neck supple.  Cardiovascular: Normal rate, regular rhythm, normal heart sounds and intact distal pulses.    Pulmonary/Chest: Effort normal and breath sounds normal.   Abdominal: Soft. Bowel sounds are normal. He exhibits no distension. There is no tenderness.   Musculoskeletal: Normal range of motion. He exhibits tenderness (midline and left mid thoracic paraspinous, mild left lower lumbar). He exhibits no edema or deformity.   Neurological: He is alert and oriented to person, place, and time. No cranial nerve deficit or  sensory deficit. He exhibits normal muscle tone. Coordination normal.   Skin: Skin is warm and dry. Capillary refill takes less than 2 seconds.   No ecchymosis   Nursing note and vitals reviewed.        MDM and ED Course     ED Medication Orders     Start Ordered     Status Ordering Provider    04/08/17 1549 04/08/17 1548  oxyCODONE-acetaminophen (PERCOCET) 5-325 MG per tablet 2 tablet  Once     Route: Oral  Ordered Dose: 2 tablet     Last MAR action:  Given Marri Mcneff J              MDM  DDx including but not limited to:  Fracture, contusion, PTX, head injury, ICH    O2 sat wnl on room air    Available past medical records reviewed    Pt declines head CT.  States he did not hit his head very hard.  No neuro complaints or abnormal exam findings.    Ice pack placed to mid and low back    XR imaging reviewed    Thoracic Spine AP Lateral and Swimmers   Final Result      No evidence of fracture.       Fonnie Mu, MD    04/08/2017 4:39 PM      Lumbar Spine 4+ Views   Final Result      1. No evidence of fracture.   2. Endplate and facet joint degenerative changes of the lumbar spine.       Fonnie Mu, MD    04/08/2017 4:34 PM      Ribs Left/PA Chest   Final Result      Nondisplaced fracture of the left anterior seventh rib.       Fonnie Mu, MD    04/08/2017 4:30 PM        Pt states feeling much better after percocet.  Appears more comfortable, able to stand up straight, walk around, take deep breaths.  No change in neuro exam.    Low suspicion for ICH/hematoma at this time, but advised close monitoring for any delayed onset neuro changes, severe headache, etc.  Discussed pillow splint for deep breathing, pain med as prescribed.  Pt takes xanax very rarely for anxiety, rx for narcan given, advised not to mix sedating medications.  Strict return precautions discsussed.  Pt verbalizes understanding and agrees with plan.  Stable for d/c home at this time, pt's wife driving home.      Procedures    Clinical  Impression & Disposition     Clinical Impression  Final diagnoses:   Closed fracture of one rib of left side, initial encounter   Lumbar contusion, initial encounter   Injury of head, initial encounter        ED Disposition     ED Disposition Condition Date/Time Comment    Discharge  Sat Apr 08, 2017  5:04 PM [name]  discharge to home/self care.    Condition at disposition: Stable           Discharge Medication List as of 04/08/2017  5:04 PM      START taking these medications    Details   naloxone (NARCAN) 4 MG/0.1ML nasal spray 1 spray intranasally. If pt does not respond or relapses into respiratory depression call 911. Give additional doses every 2-3 min., Print      oxyCODONE-acetaminophen (PERCOCET) 5-325 MG per tablet Take 1 tablet by mouth every 4 (four) hours as needed for Pain., Starting Sat 04/08/2017, Print                       Baird Kay, FNP  04/08/17 2000

## 2017-04-08 NOTE — ED Notes (Signed)
Bed: E06  Expected date:   Expected time:   Means of arrival:   Comments:

## 2017-04-08 NOTE — Discharge Instructions (Signed)
Rib Fracture    You have been diagnosed with a rib fracture ("broken rib").    Fracture means "broken bone." Rib fractures are broken ribs. When there is a rib fracture, muscles between the ribs are also likely bruised. This is called a chest wall contusion. Both conditions are painful. This is because every breath moves the injured area. The conditions are not dangerous on their own. Sometimes complications happen. These include pneumonia or a collapsed lung. Rib fractures take 4 to 8 weeks to heal. Your pain should go down over this time.    Do not bind or tape your ribs. Binding or taping them may help with the pain. However, it makes the risk of pneumonia worse.    Cough and deep breathe at least 10 times an hour while awake. Do this even if it is painful. Support the area with a pillow or your hand. This will help with the pain. Use pain medicine as prescribed. This will help with the pain. You will be able to breathe normally. This will help you do the coughing and deep-breathing exercises. These exercises can help prevent pneumonia.    YOU SHOULD SEEK MEDICAL ATTENTION IMMEDIATELY, EITHER HERE OR AT THE NEAREST EMERGENCY DEPARTMENT, IF ANY OF THE FOLLOWING OCCURS:   Shortness of breath (wheezing or trouble breathing).   Coughing up green or yellow material.   Fever (temperature higher than 100.78F / 38C) or any fever that doesn't go away.   Severe chest pain or pain that suddenly gets worse.   No improvement in the next few days.      Head Injury, NOS    You have been seen for a head injury.    A head injury can happen after something strikes the head or as a result of a fall or other injury. Head injuries can range from mild injuries to more severe injuries. The more severe injuries can result in broken bones or injury to the brain itself. Mild head injuries will show no abnormalities if a CT (CAT) scan of the brain is done.     Although you had an injury to your head, you do not seem to have a  serious brain injury.     Head injury symptoms can last from hours to months. The time depends on how bad the injury was. It also depends on whether you've had a concussion in the past. Some problems with a concussion can include: Sleep, memory and concentration problems. They also include chronic (ongoing) headaches and sensitivity to light. These symptoms can happen soon after the concussion. They can also develop slowly over time. They can last up to a year. When this happens, it is called "post concussion syndrome."    If you develop "post-concussive syndrome," you should follow up with your doctor. Your doctor can care for you or provide a referral to a head-injury specialist.    Treatment includes observation at home and pain medicine like acetaminophen (Tylenol)     You might have a mild headache for a few days.    Over the next 24 hours:   Stay with family or friends who can watch your behavior.   Avoid alcohol or drugs.    YOU SHOULD SEEK MEDICAL ATTENTION IMMEDIATELY, EITHER HERE OR AT THE NEAREST EMERGENCY DEPARTMENT, IF ANY OF THE FOLLOWING OCCURS:   Your headache gets worse.   Your headache pain changes.   You have fever (temperature higher than 100.78F / 38C), neck pain, vision changes, difficulty walking  You have fever (temperature higher than 100.4F / 38C), neck pain, vision changes, difficulty walking or change of behavior.   You feel numbness, tingling, weakness in your arms or legs.   You faint.   Your vision changes.   You vomit often or cannot keep medicine down.   You are confused or have difficulty waking from sleep.

## 2017-04-10 ENCOUNTER — Encounter (INDEPENDENT_AMBULATORY_CARE_PROVIDER_SITE_OTHER): Payer: Self-pay | Admitting: Family Medicine

## 2017-04-10 ENCOUNTER — Ambulatory Visit (INDEPENDENT_AMBULATORY_CARE_PROVIDER_SITE_OTHER): Payer: Medicare Other | Admitting: Family Medicine

## 2017-04-10 VITALS — BP 123/80 | HR 68 | Temp 97.5°F | Ht 73.0 in | Wt 228.0 lb

## 2017-04-10 DIAGNOSIS — M62830 Muscle spasm of back: Secondary | ICD-10-CM

## 2017-04-10 DIAGNOSIS — S2232XA Fracture of one rib, left side, initial encounter for closed fracture: Secondary | ICD-10-CM

## 2017-04-10 MED ORDER — NAPROXEN 500 MG PO TABS
500.0000 mg | ORAL_TABLET | Freq: Two times a day (BID) | ORAL | 0 refills | Status: DC
Start: 2017-04-10 — End: 2017-11-17

## 2017-04-10 MED ORDER — OXYCODONE-ACETAMINOPHEN 10-325 MG PO TABS
1.0000 | ORAL_TABLET | Freq: Four times a day (QID) | ORAL | 0 refills | Status: DC | PRN
Start: 2017-04-10 — End: 2017-11-17

## 2017-04-10 NOTE — Progress Notes (Signed)
Have you seen any specialists/other providers since your last visit with [place]?      No      Arm preference verified?     Yes    The patient is due for colonoscopy, spirometry, pneumonia vaccine and pcmh,shingrix,medicare wellness,flu shot

## 2017-04-12 NOTE — Progress Notes (Signed)
Subjective:      Patient ID: Jonathan [name] is a 66 y.o. male     Chief Complaint   Patient presents with   . Rib Injury     left rib fracture pt slipped on steps 04/08/17 pt was seen in ER pt stll having pain        HPI     Presents for evaluation of     Left rib fracture. 2 days ago patient slipped and fell outdoors while carrying items to his house. He was wearing flip flops. He fell hard, breath knocked out of him. He initially thought things were okay, but he continued to experience worsening back pain. He eventually went to the ER because the pain became unbearable. In the ER he was informed he had left rib fracture. He was given 2 tabs of percocet in the ER and he felt significant improvement in his pain. He was able to eat dinner a few hours later comfortably, however since waking up yesterday he's been in pain despite taking the percocet. When he woke up Sunday morning, he took just 1 tablet of percocet (5-325mg ) and this didn't touch his pain at all. 4 hours later he then took 1.5 tablets with just a slight improvement in the pain. He's having intermittent sharp pain that worsens with movement and causes him to catch his breath.     The following sections were reviewed this encounter by the provider:   Allergies  Meds  Problems         Review of Systems   Constitutional: Positive for activity change. Negative for fever.   Respiratory: Positive for shortness of breath.    Cardiovascular: Negative for chest pain and palpitations.   Genitourinary: Positive for flank pain (left sided).   Musculoskeletal: Positive for back pain (left sided mid back).   Neurological: Negative for dizziness and headaches.   Psychiatric/Behavioral: Positive for sleep disturbance.          BP 123/80 (BP Site: Right arm, Patient Position: Sitting, Cuff Size: Large)   Pulse 68   Temp 97.5 F (36.4 C) (Oral)   Ht 1.854 m (6\' 1" )   Wt 103.4 kg (228 lb)   BMI 30.08 kg/m     Objective:     Physical Exam   Constitutional: He is  oriented to person, place, and time. He appears well-developed and well-nourished. No distress.   Cardiovascular: Normal rate.  An irregularly irregular rhythm present.   Pulmonary/Chest: Effort normal and breath sounds normal. No respiratory distress.   Musculoskeletal:        Thoracic back: He exhibits pain and spasm. He exhibits no edema.        Back:         Arms:  Neurological: He is alert and oriented to person, place, and time.   Vitals reviewed.       Assessment:     1. Closed fracture of one rib of left side, initial encounter  - naproxen (NAPROSYN) 500 MG tablet; Take 1 tablet (500 mg total) by mouth 2 (two) times daily with meals.  Dispense: 30 tablet; Refill: 0  - oxyCODONE-acetaminophen (PERCOCET) 10-325 MG per tablet; Take 1 tablet by mouth every 6 (six) hours as needed for Pain.  Dispense: 20 tablet; Refill: 0    2. Muscle spasm of back  - naproxen (NAPROSYN) 500 MG tablet; Take 1 tablet (500 mg total) by mouth 2 (two) times daily with meals.  Dispense: 30 tablet; Refill: 0  Plan:     New. Increased percocet to 10-325mg  every 6 hours as needed prn pain. Want him to alternate with naproxen with food.     New. Likely exacerbated by the fall and contributing to the sharp pain he's experiencing intermittently. POC as noted above. Heating pad, massage. Continue to monitor, he may benefit from addition of muscle relaxant.     Emelda Brothers, MD

## 2017-04-24 ENCOUNTER — Ambulatory Visit (INDEPENDENT_AMBULATORY_CARE_PROVIDER_SITE_OTHER): Payer: Medicare Other

## 2017-04-24 DIAGNOSIS — Z23 Encounter for immunization: Secondary | ICD-10-CM

## 2017-04-24 NOTE — Progress Notes (Signed)
Patient came into office for hepatitis B Vaccine Administration. Injection received left Deltoid. Patient left in good condition, no reaction.

## 2017-04-28 ENCOUNTER — Ambulatory Visit (INDEPENDENT_AMBULATORY_CARE_PROVIDER_SITE_OTHER): Payer: Medicare Other | Admitting: Clinical Cardiac Electrophysiology

## 2017-04-28 DIAGNOSIS — Z950 Presence of cardiac pacemaker: Secondary | ICD-10-CM

## 2017-05-01 NOTE — Progress Notes (Signed)
[facility] TTM Report    Today, a TTM was performed on this patient.     [employer] Jude Nano, implanted 06/20/2014.    Normal pacing and battery function were observed.    PLAN: Monthly TTM due to advisory regarding premature battery depletion.

## 2017-05-11 ENCOUNTER — Ambulatory Visit
Admission: RE | Admit: 2017-05-11 | Discharge: 2017-05-11 | Disposition: A | Discharge status: Home / Self Care | Payer: Medicare Other | Source: Ambulatory Visit | Attending: Internal Medicine | Admitting: Internal Medicine

## 2017-05-11 DIAGNOSIS — I4891 Unspecified atrial fibrillation: Secondary | ICD-10-CM | POA: Insufficient documentation

## 2017-05-11 LAB — PT/INR
PT INR: 2.4 — ABNORMAL HIGH (ref 0.9–1.1)
PT: 26 s — ABNORMAL HIGH (ref 12.6–15.0)

## 2017-05-12 ENCOUNTER — Telehealth (INDEPENDENT_AMBULATORY_CARE_PROVIDER_SITE_OTHER): Payer: Self-pay

## 2017-05-12 NOTE — Telephone Encounter (Signed)
I tried contacting patient regarding INR done 05/11/17 with a result of 2.4. No answer. L/M on v/m requesting patient call me back at [phone] to let me know how much Coumadin he is currently taking so we can update records.

## 2017-05-19 ENCOUNTER — Encounter (INDEPENDENT_AMBULATORY_CARE_PROVIDER_SITE_OTHER): Payer: Self-pay | Admitting: Physician Assistant

## 2017-05-26 ENCOUNTER — Telehealth (INDEPENDENT_AMBULATORY_CARE_PROVIDER_SITE_OTHER): Payer: Medicare Other

## 2017-06-30 ENCOUNTER — Encounter (INDEPENDENT_AMBULATORY_CARE_PROVIDER_SITE_OTHER): Payer: Self-pay | Admitting: Physician Assistant

## 2017-06-30 ENCOUNTER — Ambulatory Visit (INDEPENDENT_AMBULATORY_CARE_PROVIDER_SITE_OTHER): Payer: Medicare Other | Admitting: Physician Assistant

## 2017-06-30 VITALS — BP 151/87 | HR 62 | Ht 73.0 in | Wt 228.0 lb

## 2017-06-30 DIAGNOSIS — I482 Chronic atrial fibrillation, unspecified: Secondary | ICD-10-CM

## 2017-06-30 DIAGNOSIS — Z950 Presence of cardiac pacemaker: Secondary | ICD-10-CM

## 2017-06-30 DIAGNOSIS — Z006 Encounter for examination for normal comparison and control in clinical research program: Secondary | ICD-10-CM

## 2017-06-30 DIAGNOSIS — Z7901 Long term (current) use of anticoagulants: Secondary | ICD-10-CM

## 2017-06-30 NOTE — Progress Notes (Signed)
[facility] OFFICE VISIT    Jonathan [name] presents to the office today for leadless pacemaker site check.   He is a 66 yo male with chronic atrial fibrillation and slow ventricular response who underwent [employer] Nanostim leadless pacemaker implantation on 06/20/14.      CHA2DS2-VASc 5 for HTN, TIA, CAD, and age > 34.  He is anticoagulated with warfarin.     Mr. Lanese continues to complain of decreased energy with exercise despite several adjustments to his rate response settings with subsequent histograms showing improvement in heart rate variability.  He has a history of CAD and underwent repeat TTE and stress imaging 11/2015, both of which were normal.  Last visit, at his request, we decreased his lower pacing rate to 50bpm.   He has not noted improvement.  He denies wt changes, chest pain, palpitations, orthopnea, LE edema, lightheadedness, presyncope or syncope.      TTE 11/2015 normal LV dimension, EF 55-60%, normal RV, mild BAE, trace MR/TR, mild ascending aorta dilation, 4.2 cm    Nuc ST 11/2015 (regadenoson) normal perfusion, no ischemia or scar      PMH:   Past Medical History:   Diagnosis Date   . Anxiety    . Atrial fibrillation 7/14 dx    holter7/14 rates 30-111, 05/2014 27-146, Avg 41  no pauses >2.5 sec. Holter 10/15 rates in 30s mostly 7 pm to 7 am, > 100 w exercise only   . Back pain     numbness in feet   . Bilirubinemia    . Bronchiectasis    . Cold hands and feet    . Coronary artery disease 01/2014    mild inf ischemia   . Dyspnea on exertion    . History of basal cell cancer    . Hyperlipidemia    . Hypertension    . Migraine headache    . Neck pain     numbness in hands   . Pacemaker    . Sick sinus syndrome     s/p pacemaker placement   . Thoracic aortic ectasia 4.30 January 2013, 4.2 12/2014   . TIA (transient ischemic attack)         MEDICATIONS:   Current Outpatient Prescriptions   Medication Sig Dispense Refill   . ALPRAZolam (XANAX) 0.5 MG tablet Take 1 tablet (0.5 mg total) by mouth every 12 (twelve)  hours as needed. 30 tablet 1   . aspirin EC 81 MG EC tablet Take 81 mg by mouth daily.     . Fluticasone Propionate (FLONASE NA) 1 puff by Nasal route as needed.     . GUAIFENESIN PO Take 400 mg by mouth 2 (two) times daily.     Marland Kitchen losartan (COZAAR) 50 MG tablet Take 1 tablet (50 mg total) by mouth nightly. 90 tablet 3   . mefloquine (LARIAM) 250 MG tablet Take 1 tablet (250 mg total) by mouth Once each week on Saturday evening.1 week before travel, weekly while away, for 4 weeks after return. 6 tablet 0   . naloxone (NARCAN) 4 MG/0.1ML nasal spray 1 spray intranasally. If pt does not respond or relapses into respiratory depression call 911. Give additional doses every 2-3 min. 2 each 0   . naproxen (NAPROSYN) 500 MG tablet Take 1 tablet (500 mg total) by mouth 2 (two) times daily with meals. 30 tablet 0   . oxyCODONE-acetaminophen (PERCOCET) 10-325 MG per tablet Take 1 tablet by mouth every 6 (six) hours as needed for  Pain. 20 tablet 0   . sertraline (ZOLOFT) 50 MG tablet Take 1 tablet (50 mg total) by mouth daily. 90 tablet 2   . warfarin (COUMADIN) 10 MG tablet Take 1 tablet (10 mg total) by mouth nightly.Adjusted for inr 90 tablet 3     No current facility-administered medications for this visit.         SH:   Social History   Substance Use Topics   . Smoking status: Former Smoker     Packs/day: 1.00     Years: 10.00     Quit date: 03/28/1984   . Smokeless tobacco: Never Used   . Alcohol use Yes      Comment: occasionally       REVIEW OF SYSTEMS: As above per HPI    PHYSICAL EXAM:  Vitals:    06/30/17 1129   BP: 151/87   Pulse: 62   Weight: 103.4 kg (228 lb)   Height: 1.854 m (6\' 1" )     Gen:  well-developed, well-nourished 66 year old male, alert and oriented and in no acute distress  HEENT: normocephalic, atraumatic; sclera anicteric  Neck: good range of motion with trachea midline and no JVD appreciated  Chest:  good air entry bilaterally and lungs are clear to auscultation bilaterally  Heart: normal S1, S2;  regular rate and rhythm; no significant murmur, rubs or gallops  Extremities: warm without edema       DEVICE INTERROGATION/REPROGRAMMING:    [employer] Nanostim leadless PM, implanted 06/20/14, set in the VVIR mode with lpr 50 bpm, max sensor rate 140 bpm.  94% V paced.  Underlying rhythm AF with ventricular response high 40s bpm.  R wave sensed at 7.5 mV.  Threshold 0.25 V at 0.4 ms, with stable trend.  Battery > 3.3 V, 9.6 years estimated longevity.  Long term ventricular heart rate histogram within normal range with good variability (HRs 50-130s).  No evidence of V high rates per histograms, though nano PM does not provide arrhythmia detection.      IMPRESSION/RECOMMENDATIONS:   1. Chronic atrial fibrillation     2. Cardiac pacemaker in situ--leadless nano stim       -Normal [employer]JM Nanostim leadless PM function  -Underlying rhythm AF with V-rate high 40s bpm  -Anticoagulated with warfarin for AF  -RV-paced 94%, no signs/symptoms of HF, TTE 11/2015 LVEF 55-60%  -he requested that his lower pacing rate be decreased to 45bpm.  Hysteresis is on, which would allow for heart rates in the 30s at times.  I told him that I would prefer for him to have a discussion with Dr.Wish prior to making this change, he is agreeable.  Will plan for his next visit to be with Dr.Wish.  -Pt's PM is under advisory for sudden early battery depletion.   This has affected < 1% of Nanostim devices worldwide.  All cases have occurred between 29-37 months post implant.   Pt is now >2.53yrs post-implant.  The recommend monitoring protocol at this time is monthly TTMs to check battery, and office PM checks every 6 months with a [employer] representative.  The advisory was discussed in detail and pt understands the monitoring protocol going forward.      -RTO 6 months with [employer] representative present, office visit with Dr.Wish  -Monthly TTMs    ----------------------------  Ramon Dredge, PA-C  [facility]  Office [phone]      Incident to  service performed with physician present in the office in accordance  with this patient's established plan of care.

## 2017-07-23 ENCOUNTER — Emergency Department: Payer: Medicare Other

## 2017-07-23 ENCOUNTER — Emergency Department
Admission: EM | Admit: 2017-07-23 | Discharge: 2017-07-23 | Disposition: A | Discharge status: Home / Self Care | Payer: Medicare Other | Attending: Emergency Medicine | Admitting: Emergency Medicine

## 2017-07-23 DIAGNOSIS — R0789 Other chest pain: Secondary | ICD-10-CM | POA: Insufficient documentation

## 2017-07-23 DIAGNOSIS — Z7901 Long term (current) use of anticoagulants: Secondary | ICD-10-CM | POA: Insufficient documentation

## 2017-07-23 DIAGNOSIS — W010XXA Fall on same level from slipping, tripping and stumbling without subsequent striking against object, initial encounter: Secondary | ICD-10-CM | POA: Insufficient documentation

## 2017-07-23 DIAGNOSIS — Z79899 Other long term (current) drug therapy: Secondary | ICD-10-CM | POA: Insufficient documentation

## 2017-07-23 DIAGNOSIS — Z7982 Long term (current) use of aspirin: Secondary | ICD-10-CM | POA: Insufficient documentation

## 2017-07-23 DIAGNOSIS — I2510 Atherosclerotic heart disease of native coronary artery without angina pectoris: Secondary | ICD-10-CM | POA: Insufficient documentation

## 2017-07-23 DIAGNOSIS — J479 Bronchiectasis, uncomplicated: Secondary | ICD-10-CM | POA: Insufficient documentation

## 2017-07-23 DIAGNOSIS — Z950 Presence of cardiac pacemaker: Secondary | ICD-10-CM | POA: Insufficient documentation

## 2017-07-23 DIAGNOSIS — E785 Hyperlipidemia, unspecified: Secondary | ICD-10-CM | POA: Insufficient documentation

## 2017-07-23 DIAGNOSIS — I10 Essential (primary) hypertension: Secondary | ICD-10-CM | POA: Insufficient documentation

## 2017-07-23 DIAGNOSIS — Z85828 Personal history of other malignant neoplasm of skin: Secondary | ICD-10-CM | POA: Insufficient documentation

## 2017-07-23 DIAGNOSIS — Y9302 Activity, running: Secondary | ICD-10-CM | POA: Insufficient documentation

## 2017-07-23 DIAGNOSIS — Z87891 Personal history of nicotine dependence: Secondary | ICD-10-CM | POA: Insufficient documentation

## 2017-07-23 DIAGNOSIS — Z8673 Personal history of transient ischemic attack (TIA), and cerebral infarction without residual deficits: Secondary | ICD-10-CM | POA: Insufficient documentation

## 2017-07-23 DIAGNOSIS — I4891 Unspecified atrial fibrillation: Secondary | ICD-10-CM | POA: Insufficient documentation

## 2017-07-23 MED ORDER — PREDNISONE 20 MG PO TABS
ORAL_TABLET | ORAL | 0 refills | Status: DC
Start: 2017-07-23 — End: 2017-11-17

## 2017-07-23 MED ORDER — CYCLOBENZAPRINE HCL 10 MG PO TABS
10.0000 mg | ORAL_TABLET | Freq: Three times a day (TID) | ORAL | 0 refills | Status: AC | PRN
Start: 2017-07-23 — End: 2017-08-07

## 2017-07-23 MED ORDER — ACETAMINOPHEN 325 MG PO TABS
650.0000 mg | ORAL_TABLET | Freq: Once | ORAL | Status: AC
Start: 2017-07-23 — End: 2017-07-23
  Administered 2017-07-23: 09:00:00 650 mg via ORAL
  Filled 2017-07-23: qty 2

## 2017-07-23 MED ORDER — ALBUTEROL SULFATE HFA 108 (90 BASE) MCG/ACT IN AERS
2.0000 | INHALATION_SPRAY | RESPIRATORY_TRACT | 0 refills | Status: DC | PRN
Start: 2017-07-23 — End: 2017-11-17

## 2017-07-23 MED ORDER — PREDNISONE 20 MG PO TABS
60.0000 mg | ORAL_TABLET | Freq: Once | ORAL | Status: AC
Start: 2017-07-23 — End: 2017-07-23
  Administered 2017-07-23: 10:00:00 60 mg via ORAL
  Filled 2017-07-23: qty 3

## 2017-07-23 MED ORDER — IBUPROFEN 600 MG PO TABS
600.0000 mg | ORAL_TABLET | Freq: Once | ORAL | Status: AC
Start: 2017-07-23 — End: 2017-07-23
  Administered 2017-07-23: 09:00:00 600 mg via ORAL
  Filled 2017-07-23: qty 1

## 2017-07-23 MED ORDER — LIDOCAINE 5 % EX PTCH
1.0000 | MEDICATED_PATCH | CUTANEOUS | Status: DC
Start: 2017-07-23 — End: 2017-07-23
  Administered 2017-07-23: 10:00:00 1 via TRANSDERMAL
  Filled 2017-07-23: qty 1

## 2017-07-23 MED ORDER — CYCLOBENZAPRINE HCL 10 MG PO TABS
10.0000 mg | ORAL_TABLET | Freq: Once | ORAL | Status: AC
Start: 2017-07-23 — End: 2017-07-23
  Administered 2017-07-23: 09:00:00 10 mg via ORAL
  Filled 2017-07-23: qty 1

## 2017-07-23 MED ORDER — ACETAMINOPHEN 325 MG PO TABS
650.0000 mg | ORAL_TABLET | ORAL | 0 refills | Status: AC | PRN
Start: 2017-07-23 — End: 2017-08-02

## 2017-07-23 MED ORDER — LIDOCAINE 5 % EX PTCH
1.0000 | MEDICATED_PATCH | Freq: Every day | CUTANEOUS | 0 refills | Status: DC
Start: 2017-07-23 — End: 2017-11-17

## 2017-07-23 NOTE — Discharge Instructions (Signed)
Musculoskeletal Chest Pain    You have been diagnosed with musculoskeletal chest pain.    Your pain is due to an injury or inflammation (swelling) of the muscles, ligaments, cartilage (soft bone), or bone in your chest. The pain is usually sharp and knife-like and becomes worse with twisting, bending, or moving. It commonly occurs in a small area, and can be irritated by pressing on it. There is usually no shortness of breath, lightheadedness, weakness, or sweaty feeling. Some children will have pain when taking a deep breath or when coughing. Exercise usually does not affect these symptoms.    Musculoskeletal chest pain is treated with anti-inflammatory medications like ibuprofen (Advil or Motrin) or naproxen (Aleve). Other pain medications are usually not needed. Depending on the reason for your symptoms, either warm or cool compresses (damp washcloths laid on the skin) may be helpful.    Most musculoskeletal chest pain improves over several days.    YOU SHOULD SEEK MEDICAL ATTENTION IMMEDIATELY, EITHER HERE OR AT THE NEAREST EMERGENCY DEPARTMENT, IF ANY OF THE FOLLOWING OCCURS:   Your pain gets worse.   Your pain makes you feel short of breath, nauseated, or sweaty.   You notice that your pain gets worse as you walk, go up stairs, or exert yourself.   You have any weakness or lightheadedness with your pain.   Your pain makes breathing difficult.   You develop a swollen leg.   Your symptoms get worse or you have other concerns.     If you can't follow up with your doctor, or if at any time you feel you need to be rechecked or seen again, come back here or go to the nearest emergency department.

## 2017-07-23 NOTE — ED Triage Notes (Addendum)
AOx4 denies LOC/ head injury or HA Pt states is generally in Afib. Pain in R side chest wall worse with deep breathing and moving R arm. SOB with exertion.

## 2017-07-23 NOTE — ED Provider Notes (Signed)
Physician/Midlevel provider first contact with patient: 07/23/17 0827         History     Chief Complaint   Patient presents with   . Chest wall pain     The history is provided by the patient and medical records. No language interpreter was used.   Fall   The accident occurred more than 1 week ago. The fall occurred while running. Impact surface: tripped running 8 days ago landing on right side/arm.  worsening chest pain so came to ED. The point of impact was the right knee (right arm). The pain is severe. He was ambulatory at the scene. Pertinent negatives include no fever, no vomiting, no headaches, no loss of consciousness and no tingling. The symptoms are aggravated by rotation and flexion. He has tried acetaminophen for the symptoms. The treatment provided moderate relief.    chronic cough.  Has bronchiectasis.    Nursing (triage) note reviewed for the following pertinent information:  pt walked into ER states fell while running 1 week ago, landed on arm against chest. c/o pain in R side chest where injury occured. denies SOB at rest but states taking a deep breath hurts and is SOB with exertion.    Past Medical History:   Diagnosis Date   . Anxiety    . Atrial fibrillation 7/14 dx    holter7/14 rates 30-111, 05/2014 27-146, Avg 41  no pauses >2.5 sec. Holter 10/15 rates in 30s mostly 7 pm to 7 am, > 100 w exercise only   . Back pain     numbness in feet   . Bilirubinemia    . Bronchiectasis    . Cold hands and feet    . Coronary artery disease 01/2014    mild inf ischemia   . Dyspnea on exertion    . History of basal cell cancer    . Hyperlipidemia    . Hypertension    . Migraine headache    . Neck pain     numbness in hands   . Pacemaker    . Sick sinus syndrome     s/p pacemaker placement   . Thoracic aortic ectasia 4.30 January 2013, 4.2 12/2014   . TIA (transient ischemic attack)        Past Surgical History:   Procedure Laterality Date   . CARDIAC CATHETERIZATION  04/2014    50% lad, tight origin of D1   .  CARDIAC PACEMAKER PLACEMENT         Family History   Problem Relation Age of Onset   . Hypertension Mother    . Aplastic anemia Mother    . Stroke Father    . Diabetes Father    . Heart disease Father         pacemaker,CAD   . COPD Father    . Leukemia Sister         cml   . Depression Sister    . Anxiety disorder Sister    . Obesity Daughter    . Heart attack Paternal Uncle    . Heart attack Paternal Grandfather    . Depression Sister    . Anxiety disorder Sister    . Sleep apnea Brother    . Cancer Brother         basal cell   . Heart attack Paternal Aunt        Social  Social History   Substance Use Topics   . Smoking status: Former Smoker  Packs/day: 1.00     Years: 10.00     Quit date: 03/28/1984   . Smokeless tobacco: Never Used   . Alcohol use Yes      Comment: occasionally       .     No Known Allergies    Home Medications     Med List Status:  In Progress Set By: Clearence Cheek, RN at 07/23/2017  8:33 AM                ALPRAZolam (XANAX) 0.5 MG tablet     Take 1 tablet (0.5 mg total) by mouth every 12 (twelve) hours as needed.     aspirin EC 81 MG EC tablet     Take 81 mg by mouth daily.     Fluticasone Propionate (FLONASE NA)     1 puff by Nasal route as needed.     GUAIFENESIN PO     Take 400 mg by mouth 2 (two) times daily.     losartan (COZAAR) 50 MG tablet     Take 1 tablet (50 mg total) by mouth nightly.     mefloquine (LARIAM) 250 MG tablet     Take 1 tablet (250 mg total) by mouth Once each week on Saturday evening.1 week before travel, weekly while away, for 4 weeks after return.     naloxone (NARCAN) 4 MG/0.1ML nasal spray     1 spray intranasally. If pt does not respond or relapses into respiratory depression call 911. Give additional doses every 2-3 min.     naproxen (NAPROSYN) 500 MG tablet     Take 1 tablet (500 mg total) by mouth 2 (two) times daily with meals.     oxyCODONE-acetaminophen (PERCOCET) 10-325 MG per tablet     Take 1 tablet by mouth every 6 (six) hours as needed for Pain.      sertraline (ZOLOFT) 50 MG tablet     Take 1 tablet (50 mg total) by mouth daily.     warfarin (COUMADIN) 10 MG tablet     Take 1 tablet (10 mg total) by mouth nightly.Adjusted for inr           Review of Systems   Constitutional: Negative for chills and fever.   Respiratory: Positive for cough.    Gastrointestinal: Negative for vomiting.   Neurological: Negative for tingling, loss of consciousness and headaches.   All other systems reviewed and are negative.      Physical Exam    BP: (!) 150/93, Heart Rate: 71, Temp: 97.6 F (36.4 C), Resp Rate: 20, SpO2: 98 %, Weight: 100 kg    Physical Exam  Nursing note and vitals reviewed.  Constitutional:  Well developed, well nourished. Awake & Oriented x3.  Head:  Atraumatic. Normocephalic.    Eyes:  PERRL. EOMI. Conjunctivae are not pale.  ENT:  Mucous membranes are moist and intact. Oropharynx is clear and symmetric.  Patent airway.  Neck:  Supple. Full ROM.    Cardiovascular:  Regular rate. Regular rhythm. No murmurs, rubs, or gallops.  Pulmonary/Chest:  No evidence of respiratory distress. Clear to auscultation bilaterally.  No wheezing, rales or rhonchi. Tender right lateral chest mid to upper.  Abdominal:  Soft and non-distended. There is no tenderness. No rebound, guarding, or rigidity.  Back:  Full ROM. Nontender.  Extremities:  No edema. No cyanosis. No clubbing. Full range of motion in all extremities.  Skin:  Skin is warm and dry.  No diaphoresis. No rash.  Neurological:  Alert, awake, and appropriate. Normal speech. Motor normal.  Psychiatric:  Good eye contact. Normal interaction, affect, and behavior.    Pox 98 ra normal no tx needed.    MDM and ED Course     ED Medication Orders     Start Ordered     Status Ordering Provider    07/23/17 [phone] 07/23/17 0940  predniSONE (DELTASONE) tablet 60 mg  Once     Route: Oral  Ordered Dose: 60 mg     Acknowledged Desare Duddy C    07/23/17 0940 07/23/17 0939  lidocaine (LIDODERM) 5 % 1 patch  Every 24 hours     Route:  Transdermal  Ordered Dose: 1 patch     Acknowledged Anjel Perfetti C    07/23/17 0857 07/23/17 0856  acetaminophen (TYLENOL) tablet 650 mg  Once     Route: Oral  Ordered Dose: 650 mg     Last MAR action:  Given Madelaine Whipple C    07/23/17 0857 07/23/17 0856  ibuprofen (ADVIL,MOTRIN) tablet 600 mg  Once     Route: Oral  Ordered Dose: 600 mg     Last MAR action:  Given Sayan Aldava C    07/23/17 0857 07/23/17 0856  cyclobenzaprine (FLEXERIL) tablet 10 mg  Once     Route: Oral  Ordered Dose: 10 mg     Last MAR action:  Given Lisaanne Lawrie C           Results for orders placed or performed during the hospital encounter of 07/23/17   Chest 2 Views    Narrative    History: chest wall injury    Technique: Single Portable View    Comparison: 04/08/2017    Findings:  The lungs appear clear.  There is no pneumothorax.  The heart is normal in size.    The mediastinum is within normal limits.    There are degenerative changes in the spine and shoulders.      Impression     No active disease is seen in the chest.    Laurena Slimmer, MD   07/23/2017 9:27 AM   XR Ribs Right 2 Views    Narrative    History: Pain after a fall CLINICAL INDICATION: pain after fall.  hx  bronchiectasis       COMPARISON: None available    FINDINGS:  3  views were obtained. There is no fracture or dislocation.  The bony architecture is normal.  No soft tissue calcification is noted  and no radiopaque foreign body is demonstrated.      Impression      No currently identifiable right rib fracture    Laurena Slimmer, MD   07/23/2017 9:28 AM       MDM  ekg read by me paced 65  Chest and ribs reviewed by me no PTX or definite fx    [place] with [facility] to take deep breaths often  Asked to return at once if worse.  In light of hx of bronciectasis will use prednisone and mdi          Procedures    Clinical Impression & Disposition     Clinical Impression  Final diagnoses:   Right-sided chest wall pain        ED Disposition     ED Disposition Condition Date/Time  Comment    Discharge  Sun Jul 23, 2017  9:43 AM Jonathan [name] discharge to home/self care.  Condition at disposition: Stable           New Prescriptions    ACETAMINOPHEN (TYLENOL) 325 MG TABLET    Take 2 tablets (650 mg total) by mouth every 4 (four) hours as needed for Pain.for up to 10 days    ALBUTEROL (PROAIR HFA) 108 (90 BASE) MCG/ACT INHALER    Inhale 2 puffs into the lungs every 4 (four) hours as needed for Wheezing.    CYCLOBENZAPRINE (FLEXERIL) 10 MG TABLET    Take 1 tablet (10 mg total) by mouth 3 (three) times daily as needed for Muscle spasms.for up to 15 days    LIDOCAINE (LIDODERM) 5 %    Place 1 patch onto the skin daily.    PREDNISONE (DELTASONE) 20 MG TABLET    Take 3 for 2 days then 2 for 2 days then 1 for 2 days then stop                 Kennith Maes, MD  07/23/17 [phone]

## 2017-07-26 ENCOUNTER — Telehealth (INDEPENDENT_AMBULATORY_CARE_PROVIDER_SITE_OTHER): Payer: Self-pay | Admitting: Family Medicine

## 2017-07-26 NOTE — Telephone Encounter (Signed)
ED Follow Up CALL:07/26/2017    EDVisit Date:07/23/2017  Primary Discharge ZO:[id number] chest wall pain  Secondary WJ:XBJY  Follow up Appt with NWG:NFAO  Call placed to patient to follow up recent hospital discharge, assess current status, address questions/concerns regarding discharge instructions / medications and encourage patient to schedule Hospital Discharge follow up appt with PCP; No Answer; LVMM with contact information and request for return call.  Will continue to follow.

## 2017-07-27 LAB — ECG 12-LEAD
Atrial Rate: 65 {beats}/min
Q-T Interval: 512 ms
QRS Duration: 208 ms
QTC Calculation (Bezet): 532 ms
R Axis: -31 degrees
T Axis: 91 degrees
Ventricular Rate: 65 {beats}/min

## 2017-07-28 ENCOUNTER — Telehealth (INDEPENDENT_AMBULATORY_CARE_PROVIDER_SITE_OTHER): Payer: Medicare Other

## 2017-08-04 ENCOUNTER — Ambulatory Visit (INDEPENDENT_AMBULATORY_CARE_PROVIDER_SITE_OTHER): Payer: Medicare Other | Admitting: Clinical Cardiac Electrophysiology

## 2017-08-04 DIAGNOSIS — Z8679 Personal history of other diseases of the circulatory system: Secondary | ICD-10-CM

## 2017-08-04 DIAGNOSIS — Z950 Presence of cardiac pacemaker: Secondary | ICD-10-CM

## 2017-08-16 NOTE — Progress Notes (Signed)
[facility] TTM Report    Today, a TTM was performed on this patient.     [employer] Jude Nano, implanted 06/20/2014.    Normal pacing and battery function were observed.    PLAN: Monthly TTM due to advisory regarding premature battery depletion.

## 2017-08-25 ENCOUNTER — Telehealth (INDEPENDENT_AMBULATORY_CARE_PROVIDER_SITE_OTHER): Payer: Medicare Other

## 2017-09-22 ENCOUNTER — Ambulatory Visit (INDEPENDENT_AMBULATORY_CARE_PROVIDER_SITE_OTHER): Payer: Medicare Other | Admitting: Clinical Cardiac Electrophysiology

## 2017-09-22 DIAGNOSIS — Z950 Presence of cardiac pacemaker: Secondary | ICD-10-CM

## 2017-09-25 ENCOUNTER — Ambulatory Visit (INDEPENDENT_AMBULATORY_CARE_PROVIDER_SITE_OTHER): Payer: Self-pay

## 2017-09-25 ENCOUNTER — Ambulatory Visit (INDEPENDENT_AMBULATORY_CARE_PROVIDER_SITE_OTHER): Payer: Medicare Other

## 2017-09-25 ENCOUNTER — Other Ambulatory Visit (INDEPENDENT_AMBULATORY_CARE_PROVIDER_SITE_OTHER): Payer: Self-pay

## 2017-09-25 ENCOUNTER — Telehealth (INDEPENDENT_AMBULATORY_CARE_PROVIDER_SITE_OTHER): Payer: Self-pay | Admitting: Internal Medicine

## 2017-09-25 DIAGNOSIS — I4891 Unspecified atrial fibrillation: Secondary | ICD-10-CM

## 2017-09-25 DIAGNOSIS — Z23 Encounter for immunization: Secondary | ICD-10-CM

## 2017-09-25 NOTE — Progress Notes (Signed)
Patient presented to the office for HEP B #3 administration.  Received injection in the Left arm.  No reaction was noted and patient left in good condition.

## 2017-09-25 NOTE — Telephone Encounter (Signed)
Hi [name] and Dr. Thelma Barge.    [name] stopped by the [facility] location today to request an order to have a lab drawn for [facility].   The current order is expired.      Pt can be reached at [phone].    Thanks  [employer]

## 2017-09-25 NOTE — Progress Notes (Signed)
Diagnosis:  atrial fibrillation/flutter  INR range:  2.0-3.0  Primary cardiologist:  Janace Litten, MD  Attention:   ______________________________    INR = , date: 09/25/17 , day: Monday  Laboratory:  [facility]    Comment:  Patient requested new INR order be placed in EPIC. Reinforced with patient the need for weekly INR checks as he appears to be non compliant with getting INR's done or returning phone calls.      New dose?  No    Glynis Smiles Tue Wed Thu Fri Sat   10 5 10 10 5 10 10      Next INR:  1 month(s)    Billing:     Diagnosis:  atrial fibrillation/flutter  INR range:  2.0-3.0  Primary cardiologist:  Janace Litten, MD  Attention:   ______________________________    INR = 2.5, date: Jun 29 , day: Friday  Laboratory:  [place]    Comment:  I tried contacting patient, no answer. Unsuccessful at reaching patient since having INR's checked by [facility]. Raiford Noble, RN able to reach patient when he requested med refill at which time patient informed Raiford Noble he was taking 10 mg qd.      New dose?  No    Glynis Smiles Tue Wed Thu Fri Sat   10 10 10 10 10 10 10      Next INR:  1 month(s)    Billing:

## 2017-10-04 NOTE — Progress Notes (Signed)
[facility] TTM Report    Today, a TTM was performed on this patient.     [employer] Jude Nano, implanted 06/20/2014.    Normal pacing and battery function were observed.    PLAN: Monthly TTM due to advisory regarding premature battery depletion.

## 2017-10-27 ENCOUNTER — Telehealth (INDEPENDENT_AMBULATORY_CARE_PROVIDER_SITE_OTHER): Payer: Medicare Other

## 2017-10-30 ENCOUNTER — Other Ambulatory Visit
Admission: RE | Admit: 2017-10-30 | Discharge: 2017-10-30 | Disposition: A | Discharge status: Home / Self Care | Payer: Medicare Other | Source: Ambulatory Visit | Attending: Internal Medicine | Admitting: Internal Medicine

## 2017-10-30 DIAGNOSIS — I4891 Unspecified atrial fibrillation: Secondary | ICD-10-CM | POA: Insufficient documentation

## 2017-10-30 LAB — PT/INR
PT INR: 2.6 — ABNORMAL HIGH (ref 0.9–1.1)
PT: 27.4 s — ABNORMAL HIGH (ref 12.6–15.0)

## 2017-10-31 ENCOUNTER — Ambulatory Visit (INDEPENDENT_AMBULATORY_CARE_PROVIDER_SITE_OTHER): Payer: Self-pay

## 2017-10-31 DIAGNOSIS — I482 Chronic atrial fibrillation, unspecified: Secondary | ICD-10-CM

## 2017-10-31 NOTE — Progress Notes (Signed)
Diagnosis: atrial fibrillation/flutter  INR range: 2.0-3.0  Primary cardiologist: Janace Litten, MD  Attention:   ______________________________    INR = 2.6 , date: October 30, 2017, day: Monday  Laboratory:[place]    Comment:  I left a detailed message on patient's v/m. I requested patient call me back to verify his dosing, so chart may be updated.     New dose? No    Sun ZOX Tue Wed Thu Fri Sat   10 5 10 10 5 10 10      Next INR: 1 month(s)    Billing:       Diagnosis: atrial fibrillation/flutter  INR range: 2.0-3.0  Primary cardiologist: Janace Litten, MD  Attention:   ______________________________    INR = , date: 09/25/17, day: Monday  Laboratory:[place]    Comment:  Patient requested new INR order be placed in EPIC. Reinforced with patient the need for weekly INR checks as he appears to be non compliant with getting INR's done or returning phone calls.     New dose? No    Glynis Smiles Tue Wed Thu Fri Sat   10 5 10 10 5 10 10      Next INR: 1 month(s)    Billing:

## 2017-11-03 ENCOUNTER — Ambulatory Visit (INDEPENDENT_AMBULATORY_CARE_PROVIDER_SITE_OTHER): Payer: Medicare Other | Admitting: Clinical Cardiac Electrophysiology

## 2017-11-03 DIAGNOSIS — Z950 Presence of cardiac pacemaker: Secondary | ICD-10-CM

## 2017-11-17 ENCOUNTER — Encounter (INDEPENDENT_AMBULATORY_CARE_PROVIDER_SITE_OTHER): Payer: Self-pay | Admitting: Family Medicine

## 2017-11-17 ENCOUNTER — Ambulatory Visit
Admission: RE | Admit: 2017-11-17 | Discharge: 2017-11-17 | Disposition: A | Discharge status: Home / Self Care | Payer: Medicare Other | Source: Ambulatory Visit | Attending: Family Medicine | Admitting: Family Medicine

## 2017-11-17 ENCOUNTER — Ambulatory Visit (INDEPENDENT_AMBULATORY_CARE_PROVIDER_SITE_OTHER): Payer: Medicare Other | Admitting: Family Medicine

## 2017-11-17 VITALS — BP 124/81 | HR 64 | Temp 97.1°F | Ht 73.0 in | Wt 210.8 lb

## 2017-11-17 DIAGNOSIS — S92514A Nondisplaced fracture of proximal phalanx of right lesser toe(s), initial encounter for closed fracture: Secondary | ICD-10-CM

## 2017-11-17 DIAGNOSIS — R739 Hyperglycemia, unspecified: Secondary | ICD-10-CM

## 2017-11-17 DIAGNOSIS — H9193 Unspecified hearing loss, bilateral: Secondary | ICD-10-CM

## 2017-11-17 DIAGNOSIS — E785 Hyperlipidemia, unspecified: Secondary | ICD-10-CM

## 2017-11-17 DIAGNOSIS — Z0000 Encounter for general adult medical examination without abnormal findings: Secondary | ICD-10-CM

## 2017-11-17 DIAGNOSIS — F4323 Adjustment disorder with mixed anxiety and depressed mood: Secondary | ICD-10-CM

## 2017-11-17 DIAGNOSIS — I10 Essential (primary) hypertension: Secondary | ICD-10-CM

## 2017-11-17 MED ORDER — ALPRAZOLAM 0.5 MG PO TABS
0.50 mg | ORAL_TABLET | Freq: Two times a day (BID) | ORAL | 0 refills | Status: DC | PRN
Start: 2017-11-17 — End: 2019-03-20

## 2017-11-17 NOTE — Progress Notes (Signed)
Jonathan [name] is a 67 y.o. male who presents today for a Medicare Annual Wellness Visit.     Health Risk Assessment     During the past month, how would you rate your general health?:  Fair  Which of the following tasks can you do without assistance - drive or take the bus alone; shop for groceries or clothes; prepare your own meals; do your own housework/laundry; handle your own finances/pay bills; eat, bathe or get around your home?:  Drive or take the bus alone, Eat, bathe, dress or get around your home, Shop for groceries or clothes, Prepare your own meals, Do your own housework/laundry, Handle your own finances/pay bills  Which of the following problems have you been bothered by in the past month - dizzy when standing up; problems using the phone; feeling tired or fatigued; moderate or severe body pain?: Feeling tired or fatigued  Do you exercise for about 20 minutes 3 or more days per week?:  Yes  During the past month was someone available to help if you needed and wanted help?  For example, if you felt nervous, lonely, got sick and had to stay in bed, needed someone to talk to, needed help with daily chores or needed help just taking care of yourself.: Yes  Do you always wear a seat belt?: Yes  Do you have any trouble taking medications the way you have been told to take them?: No  Have you been given any information that can help you with keeping track of your medications?: Yes  Do you have trouble paying for your medications?: No  Have you been given any information that can help you with hazards in your house, such as scatter rugs, furniture, etc?: No  Do you feel unsteady when standing or walking?: No  Do you worry about falling?: Yes  Have you fallen two or more times in the past year?:    Did you suffer any injuries from your falls in the past year?: Yes    Additional Concerns    Swelling of 4th toe of right foot after a slip down wet concrete steps with flip flops on a month ago while in [place]. He was  seen at an urgent care where xray showed fracture of proximal 4th phalanx. He was placed in a soft sole fracture foot. Still with some swelling of 4th toe. He continues to have pain if he walks barefoot. Some discomfort when he wears flip flops. He's not tried wearing another type of shoe other than the fracture boot or slip flops. Hasn't seen an orthopedist.     Tires more quickly. Has h/o heart failure. He's scheduling an appt to see cardiology.     Depressed mood: reports feeling more easily tearful lately. He was previously on 50mg  sertraline for a little over a year. Felt he was doing good so last fall he self reduced the dose to 25mg  once daily. Reports home stressors (legal concerns for his son, issues involving a work issue from nearly 30 years ago).      Patient Care Team:  Emelda Brothers, MD as PCP - General (Family Medicine)  Wish, Wendie Simmer, MD as Consulting Physician (Clinical Cardiac Electrophysiology)  Higinio Roger, MD as Consulting Physician (Clinical Cardiac Electrophysiology)  Forde Dandy, PA as Physician Assistant (Physician Assistant)  Janace Litten, MD as Consulting Physician (Internal Medicine)    Past Medical History:   Diagnosis Date   . Anxiety    . Atrial  fibrillation 7/14 dx    holter7/14 rates 30-111, 05/2014 27-146, Avg 41  no pauses >2.5 sec. Holter 10/15 rates in 30s mostly 7 pm to 7 am, > 100 w exercise only   . Back pain     numbness in feet   . Bilirubinemia    . Bronchiectasis    . Cold hands and feet    . Coronary artery disease 01/2014    mild inf ischemia   . Depression    . Dyspnea on exertion    . History of basal cell cancer    . Hyperlipidemia    . Hypertension    . Migraine headache    . Neck pain     numbness in hands   . Pacemaker    . Sick sinus syndrome     s/p pacemaker placement   . Thoracic aortic ectasia 4.30 January 2013, 4.2 12/2014   . TIA (transient ischemic attack)      Past Surgical History:   Procedure Laterality Date   . CARDIAC CATHETERIZATION  04/2014     50% lad, tight origin of D1   . CARDIAC PACEMAKER PLACEMENT       No Known Allergies   Current Outpatient Prescriptions   Medication Sig Dispense Refill   . ALPRAZolam (XANAX) 0.5 MG tablet Take 1 tablet (0.5 mg total) by mouth every 12 (twelve) hours as needed for Anxiety 30 tablet 0   . aspirin 325 MG tablet Take 325 mg by mouth daily     . Fluticasone Propionate (FLONASE NA) 1 puff by Nasal route as needed.     . GUAIFENESIN PO Take 400 mg by mouth 2 (two) times daily.     Marland Kitchen losartan (COZAAR) 50 MG tablet Take 1 tablet (50 mg total) by mouth nightly. 90 tablet 3   . sertraline (ZOLOFT) 50 MG tablet Take 1 tablet (50 mg total) by mouth daily. 90 tablet 2   . warfarin (COUMADIN) 10 MG tablet Take 1 tablet (10 mg total) by mouth nightly.Adjusted for inr 90 tablet 3   . mefloquine (LARIAM) 250 MG tablet Take 1 tablet (250 mg total) by mouth Once each week on Saturday evening.1 week before travel, weekly while away, for 4 weeks after return. 6 tablet 0     No current facility-administered medications for this visit.       Social History   Substance Use Topics   . Smoking status: Former Smoker     Packs/day: 1.00     Years: 10.00     Quit date: 03/28/1984   . Smokeless tobacco: Never Used   . Alcohol use Yes      Comment: occasionally      Family History   Problem Relation Age of Onset   . Hypertension Mother    . Aplastic anemia Mother    . Anemia Mother         aplastic anemia   . Stroke Father    . Diabetes Father    . Heart disease Father         pacemaker,CAD   . COPD Father    . Leukemia Sister         cml   . Depression Sister    . Anxiety disorder Sister    . Obesity Daughter    . Heart attack Paternal Uncle    . Heart attack Paternal Grandfather    . Depression Sister    . Anxiety disorder Sister    .  Sleep apnea Brother    . Cancer Brother         basal cell   . Heart attack Paternal Aunt         The following sections were reviewed this encounter by the provider:   Tobacco  Allergies  Meds  Problems   Med Hx  Surg Hx  Fam Hx  Soc Hx          Hospitalizations  no hospitalizations within past year    Depression Screening    See related Activity or Flowsheet    Functional Ability    Falls Risk:  home does not have throw rugs, poor lighting or a slippery bath tub or shower  Hearing:  hearing slightly decreased  Exercise:  Moderate ( i.e. brisk walking ) and exercises 3-4x/week  ADL's:   Bathing - independent   Dressing - independent   Mobility - independent   Transfer - independent   Eating - independent}   Toileting - independent   ADL assistance not needed    Discussion of Advance Directives: Has an Advanced Directive. A copy has been provided and is in chart.     Assessment    Review of Systems   Constitutional: Negative for weight loss.   Eyes:        Negative vision changes   Respiratory: Positive for shortness of breath (chronic).    Cardiovascular: Negative for chest pain.   Musculoskeletal: Positive for joint pain (dorsum of right goot). Negative for myalgias.   Neurological: Negative for dizziness, speech change, focal weakness and headaches.       BP 124/81 (BP Site: Right arm, Patient Position: Sitting, Cuff Size: Medium)   Pulse 64   Temp 97.1 F (36.2 C) (Oral)   Ht 1.854 m (6\' 1" )   Wt 95.6 kg (210 lb 12.8 oz)   SpO2 97%   BMI 27.81 kg/m      Vision Screening (required for IPPE only): Corrected:     L 20/15     R 20/15  Screening EKG (IPPE only): not indicated, follows with cardiology for h/o heart failure        Evaluation of Cognitive Function    Mood/affect: worried when talking about his son. affect is congruent with mood.  Appearance: neatly groomed, appropriately and adequately nourished  Family member/caregiver input: Not present      AWV Mini-Cog Result:  > 3 points - negative screen for dementia    Physical Exam   Constitutional: He is oriented to person, place, and time and well-developed, well-nourished, and in no distress. No distress.   Neck: Neck supple.   Cardiovascular: Normal  rate.  An irregularly irregular rhythm present.   Murmur heard.  Pulmonary/Chest: Effort normal and breath sounds normal. No respiratory distress.   Musculoskeletal: He exhibits no edema.   Neurological: He is alert and oriented to person, place, and time. He exhibits normal muscle tone.   Psychiatric: Memory and judgment normal.   Anxious/worred for his son. Affect congruent with mood.    Vitals reviewed.    Assessment    1. Routine general medical examination at a health care facility    2. Closed nondisplaced fracture of proximal phalanx of lesser toe of right foot, initial encounter  - XR Toe Right 2 + Views    3. Essential hypertension  - Comprehensive Metabolic Panel; Future    4. Hyperlipidemia, unspecified hyperlipidemia type  - Lipid panel; Future    5. Hyperglycemia  -  Hemoglobin A1c; Future    6. Adjustment disorder with mixed anxiety and depressed mood  - ALPRAZolam (XANAX) 0.5 MG tablet; Take 1 tablet (0.5 mg total) by mouth every 12 (twelve) hours as needed for Anxiety  Dispense: 30 tablet; Refill: 0    7. Decreased hearing of both ears  - Ambulatory referral to Audiology    Plan    Continue heart healthy lifestyle habits.     New. Xray of right foot ordered to follow up on healing status of fracture. Management pending xray results.     Chronic. Controlled on current regimen.     Chronic. A little uncontrolled and above goal of less than 100. F/u cholesterol profile ordered. On no cholesterol lowering medicines at this time.     Chronic. F/u A1c ordered to risk stratify for DM disease. Last A1c was in 2016 and was 6.0    Chronic. Acute worsening due to recent stressors. Recommended he increase sertraline back to 50mg  daily. Script for xanax renewed. His last script was written in 2017.     Emelda Brothers, MD  11/20/2017

## 2017-11-17 NOTE — Progress Notes (Signed)
Have you seen any specialists/other providers since your last visit with [place]?    No    Arm preference verified?   Yes    Health Maintenance Due   Topic Date Due   . COLONOSCOPY TEN YEARS  12/25/1950   . [facility] CARE PLAN LETTER  06/01/50   . Spirometry every Two Years  10-May-1951   . Shingrix Vaccine 50+ (1) 09/14/1999   . LOW DOSE CT LUNG CANCER SCREENING  09/13/2004   . Pneumonia Vaccine Age 68+ (2 of 2 - PPSV23) 05/05/2016

## 2017-11-20 ENCOUNTER — Encounter (INDEPENDENT_AMBULATORY_CARE_PROVIDER_SITE_OTHER): Payer: Self-pay | Admitting: Family Medicine

## 2017-11-20 NOTE — Progress Notes (Signed)
[facility] TTM Report    Today, a TTM was performed on this patient.     [employer] Jude Nano, implanted 06/20/2014.    Normal pacing and battery function were observed.    PLAN: Monthly TTM due to advisory regarding premature battery depletion.

## 2017-11-23 ENCOUNTER — Encounter (INDEPENDENT_AMBULATORY_CARE_PROVIDER_SITE_OTHER): Payer: Self-pay | Admitting: Family Medicine

## 2017-11-24 ENCOUNTER — Telehealth (INDEPENDENT_AMBULATORY_CARE_PROVIDER_SITE_OTHER): Payer: Medicare Other

## 2017-11-28 ENCOUNTER — Other Ambulatory Visit (FREE_STANDING_LABORATORY_FACILITY): Payer: Medicare Other

## 2017-11-28 DIAGNOSIS — R739 Hyperglycemia, unspecified: Secondary | ICD-10-CM

## 2017-11-28 DIAGNOSIS — E785 Hyperlipidemia, unspecified: Secondary | ICD-10-CM

## 2017-11-28 DIAGNOSIS — I10 Essential (primary) hypertension: Secondary | ICD-10-CM

## 2017-11-28 LAB — COMPREHENSIVE METABOLIC PANEL
ALT: 13 U/L (ref 0–55)
AST (SGOT): 19 U/L (ref 5–34)
Albumin/Globulin Ratio: 1 (ref 0.9–2.2)
Albumin: 3.6 g/dL (ref 3.5–5.0)
Alkaline Phosphatase: 79 U/L (ref 38–106)
BUN: 19 mg/dL (ref 9.0–28.0)
Bilirubin, Total: 0.6 mg/dL (ref 0.2–1.2)
CO2: 23 mEq/L (ref 21–29)
Calcium: 9.6 mg/dL (ref 8.5–10.5)
Chloride: 105 mEq/L (ref 100–111)
Creatinine: 1.4 mg/dL (ref 0.5–1.5)
Globulin: 3.7 g/dL (ref 2.0–3.7)
Glucose: 97 mg/dL (ref 70–100)
Potassium: 4.5 mEq/L (ref 3.5–5.1)
Protein, Total: 7.3 g/dL (ref 6.0–8.3)
Sodium: 139 mEq/L (ref 136–145)

## 2017-11-28 LAB — LIPID PANEL
Cholesterol / HDL Ratio: 3.9
Cholesterol: 137 mg/dL (ref 0–199)
HDL: 35 mg/dL — ABNORMAL LOW (ref 40–9999)
LDL Calculated: 89 mg/dL (ref 0–99)
Triglycerides: 67 mg/dL (ref 34–149)
VLDL Calculated: 13 mg/dL (ref 10–40)

## 2017-11-28 LAB — GFR: EGFR: 50.4

## 2017-11-28 LAB — HEMOLYSIS INDEX: Hemolysis Index: 5 (ref 0–18)

## 2017-11-28 LAB — HEMOGLOBIN A1C
Average Estimated Glucose: 137 mg/dL
Hemoglobin A1C: 6.4 % — ABNORMAL HIGH (ref 4.6–5.9)

## 2017-12-04 ENCOUNTER — Other Ambulatory Visit (FREE_STANDING_LABORATORY_FACILITY): Payer: Medicare Other

## 2017-12-04 ENCOUNTER — Ambulatory Visit (INDEPENDENT_AMBULATORY_CARE_PROVIDER_SITE_OTHER): Payer: Medicare Other | Admitting: Internal Medicine

## 2017-12-04 ENCOUNTER — Encounter (INDEPENDENT_AMBULATORY_CARE_PROVIDER_SITE_OTHER): Payer: Self-pay | Admitting: Internal Medicine

## 2017-12-04 VITALS — BP 109/62 | HR 56 | Ht 72.05 in | Wt 205.4 lb

## 2017-12-04 DIAGNOSIS — I482 Chronic atrial fibrillation, unspecified: Secondary | ICD-10-CM

## 2017-12-04 DIAGNOSIS — I2510 Atherosclerotic heart disease of native coronary artery without angina pectoris: Secondary | ICD-10-CM

## 2017-12-04 LAB — CBC AND DIFFERENTIAL
Absolute NRBC: 0 10*3/uL (ref 0.00–0.00)
Basophils Absolute Automated: 0.08 10*3/uL (ref 0.00–0.08)
Basophils Automated: 1.1 %
Eosinophils Absolute Automated: 0.04 10*3/uL (ref 0.00–0.44)
Eosinophils Automated: 0.6 %
Hematocrit: 40.6 % (ref 37.6–49.6)
Hgb: 12.6 g/dL (ref 12.5–17.1)
Immature Granulocytes Absolute: 0.07 10*3/uL (ref 0.00–0.07)
Immature Granulocytes: 1 %
Lymphocytes Absolute Automated: 1.21 10*3/uL (ref 0.42–3.22)
Lymphocytes Automated: 17 %
MCH: 28.8 pg (ref 25.1–33.5)
MCHC: 31 g/dL — ABNORMAL LOW (ref 31.5–35.8)
MCV: 92.7 fL (ref 78.0–96.0)
MPV: 10.2 fL (ref 8.9–12.5)
Monocytes Absolute Automated: 0.39 10*3/uL (ref 0.21–0.85)
Monocytes: 5.5 %
Neutrophils Absolute: 5.34 10*3/uL (ref 1.10–6.33)
Neutrophils: 74.8 %
Nucleated RBC: 0 /100 WBC (ref 0.0–0.0)
Platelets: 441 10*3/uL — ABNORMAL HIGH (ref 142–346)
RBC: 4.38 10*6/uL (ref 4.20–5.90)
RDW: 14 % (ref 11–15)
WBC: 7.13 10*3/uL (ref 3.10–9.50)

## 2017-12-04 LAB — B-TYPE NATRIURETIC PEPTIDE: B-Natriuretic Peptide: 237 pg/mL — ABNORMAL HIGH (ref 0–100)

## 2017-12-04 NOTE — Progress Notes (Signed)
[facility] [place] [facility] VISIT      Chief Complaint   Patient presents with   . Annual Exam       I had the pleasure of seeing Jonathan [name] today for cardiovascular follow up. He is a pleasant 67 y.o. male with a history of Chronic Atrial Fibrillation and Demand Ventricular Pacing who presents for continued management.      S/P Sick Sinus Syndrome.   Elevated proBNP levels, feeling tired and weak. Low  GFR and weight lost ( 25-30 lbs). Transient lightheadedness.          MEDICATIONS:     Current Outpatient Prescriptions   Medication Sig Dispense Refill   . ALPRAZolam (XANAX) 0.5 MG tablet Take 1 tablet (0.5 mg total) by mouth every 12 (twelve) hours as needed for Anxiety 30 tablet 0   . aspirin 325 MG tablet Take 325 mg by mouth daily     . Fluticasone Propionate (FLONASE NA) 1 puff by Nasal route as needed.     . GUAIFENESIN PO Take 400 mg by mouth 2 (two) times daily.     Marland Kitchen losartan (COZAAR) 50 MG tablet Take 1 tablet (50 mg total) by mouth nightly. 90 tablet 3   . mefloquine (LARIAM) 250 MG tablet Take 1 tablet (250 mg total) by mouth Once each week on Saturday evening.1 week before travel, weekly while away, for 4 weeks after return. 6 tablet 0   . sertraline (ZOLOFT) 50 MG tablet Take 1 tablet (50 mg total) by mouth daily. 90 tablet 2   . warfarin (COUMADIN) 10 MG tablet Take 1 tablet (10 mg total) by mouth nightly.Adjusted for inr 90 tablet 3     No current facility-administered medications for this visit.        REVIEW OF SYSTEMS: All other systems reviewed and negative except as above.    PHYSICAL EXAMINATION  Vital Signs: BP 109/62 (BP Site: Left arm, Patient Position: Sitting, Cuff Size: Large)   Pulse (!) 56   Ht 1.83 m (6' 0.05")   Wt 93.2 kg (205 lb 6.4 oz)   BMI 27.82 kg/m    Vital signs reviewed    Wt Readings from Last 3 Encounters:   12/04/17 93.2 kg (205 lb 6.4 oz)   11/17/17 95.6 kg (210 lb 12.8 oz)   07/23/17 100 kg (220 lb 7.4 oz)        General Appearance:  A well-appearing male in no  acute distress.    HEENT: Sclera anicteric, conjunctiva without pallor, moist mucous membranes, normal dentition.   Neck:  Supple without jugular venous distention.  Normal carotid upstrokes without bruits.   Chest: Clear to auscultation bilaterally with good air movement and respiratory effort and no wheezes, rales, or rhonchi   Cardiac: RRR.  Normal S1 and physiologically split S2, without gallops or rub. No murmurs.   Vascular:  2+ carotid, radial pulses bilaterally  Abdomen: Soft, nontender, nondistended, with normoactive bowel sounds.  No bruits.   Extremities: Warm without edema, clubbing, or cyanosis.   Skin: No rash, warm, appropriate for race.   Neuro: Alert and oriented x3. Grossly intact.  CN II-XII intact.  Normal mood and affect.     ECG:   Independent review shows:Atrial Fibrillation  \  ASSESSMENT/PLAN:  Chronic Atrial Fibrillation  Sick Sinus Syndrome with permanent pacemaker     Followed by Dr. Loraine Leriche Wish  Plan  Continue current Rx    Janace Litten, MD  12/04/2017

## 2017-12-07 ENCOUNTER — Ambulatory Visit (INDEPENDENT_AMBULATORY_CARE_PROVIDER_SITE_OTHER): Payer: Medicare Other

## 2017-12-07 ENCOUNTER — Ambulatory Visit (INDEPENDENT_AMBULATORY_CARE_PROVIDER_SITE_OTHER): Payer: Medicare Other | Admitting: Cardiovascular Disease

## 2017-12-07 VITALS — Ht 73.0 in | Wt 200.0 lb

## 2017-12-07 DIAGNOSIS — I2510 Atherosclerotic heart disease of native coronary artery without angina pectoris: Secondary | ICD-10-CM

## 2017-12-07 DIAGNOSIS — I482 Chronic atrial fibrillation: Secondary | ICD-10-CM

## 2017-12-07 DIAGNOSIS — I25119 Atherosclerotic heart disease of native coronary artery with unspecified angina pectoris: Secondary | ICD-10-CM

## 2017-12-07 MED ORDER — TECHNETIUM TC 99M TETROFOSMIN INJECTION
1.00 | Freq: Once | Status: AC
Start: 2017-12-07 — End: 2017-12-07
  Administered 2017-12-07: 11:00:00 1 via INTRAVENOUS

## 2017-12-07 MED ORDER — REGADENOSON 0.4 MG/5ML IV SOLN
0.40 mg | Freq: Once | INTRAVENOUS | Status: AC
Start: 2017-12-07 — End: 2017-12-07
  Administered 2017-12-07: 13:00:00 0.4 mg via INTRAVENOUS

## 2017-12-07 MED ORDER — TECHNETIUM TC 99M TETROFOSMIN INJECTION
1.00 | Freq: Once | Status: AC
Start: 2017-12-07 — End: 2017-12-07
  Administered 2017-12-07: 13:00:00 1 via INTRAVENOUS

## 2017-12-07 NOTE — Procedures (Signed)
[facility] NUCLEAR ECG REPORT     [facility]  Tel [phone]      PATIENT:  Jonathan [name]         MRN: [id number].  Gender: male.  DOB: 04-Dec-1950.  Age: 67 y.o.    Date of study:  12/07/2017    Ordering Physician:  Janace Litten, MD  Primary Physician:  Emelda Brothers, MD  Primary Cardiologist:  Janace Litten, MD    INDICATION:  Chronic Afib, CAD    LEXISCAN ECG DATA:  Resting ECG:  Atrial fibrillation.   Ventricular pacing.      Protocol:  Lexiscan  Stress time:  1 minutes 23 seconds.   Vitals at rest:  58 bpm, 138/84 mmHg  Vitals at peak stress:  63 bpm, 138/84 mmHg    Reason for stopping exercise:  Protocol completed   Symptoms:  No Chest pain  ST changes at peak stress: N/A  Arrhythmias: None  Recovery:  No adverse reaction to Lexiscan    LEXISCAN ECG IMPRESSION:  1. Patient tolerated the pharmacological stress   2. Nuclear Imaging report will be reported separately       Lexiscan ECG interpreted and electronically signed by:  Elspeth Cho, MD, [facility]

## 2017-12-08 ENCOUNTER — Encounter (INDEPENDENT_AMBULATORY_CARE_PROVIDER_SITE_OTHER): Payer: Self-pay | Admitting: Family Medicine

## 2017-12-11 ENCOUNTER — Encounter (INDEPENDENT_AMBULATORY_CARE_PROVIDER_SITE_OTHER): Payer: Self-pay | Admitting: Cardiology

## 2017-12-11 ENCOUNTER — Other Ambulatory Visit (INDEPENDENT_AMBULATORY_CARE_PROVIDER_SITE_OTHER): Payer: Self-pay

## 2017-12-11 ENCOUNTER — Telehealth (INDEPENDENT_AMBULATORY_CARE_PROVIDER_SITE_OTHER): Payer: Self-pay

## 2017-12-11 NOTE — Telephone Encounter (Signed)
I called and s/w patient. App't for echo and f/u OV previously scheduled. I advised patient to keep both as scheduled.           Janace Litten, MD  Michiel Sites, RN            No significant findings on the stress test but needs an echo,then we can discuss the test   I have ordered the echo   cf

## 2017-12-15 ENCOUNTER — Other Ambulatory Visit (INDEPENDENT_AMBULATORY_CARE_PROVIDER_SITE_OTHER): Payer: Self-pay | Admitting: Internal Medicine

## 2017-12-15 ENCOUNTER — Telehealth (INDEPENDENT_AMBULATORY_CARE_PROVIDER_SITE_OTHER): Payer: Self-pay | Admitting: Internal Medicine

## 2017-12-15 DIAGNOSIS — I4891 Unspecified atrial fibrillation: Secondary | ICD-10-CM

## 2017-12-15 NOTE — Telephone Encounter (Signed)
Hi Meagen and Dr. Thelma Barge.    Mr. Moch stopped by the [facility] location this morning to request an order for PT/INR.    Pt can be reached at [phone] (M).      Thanks  [employer]

## 2017-12-15 NOTE — Telephone Encounter (Signed)
I called and notified patient a new standing PT/INR order has been placed.

## 2017-12-18 ENCOUNTER — Ambulatory Visit (INDEPENDENT_AMBULATORY_CARE_PROVIDER_SITE_OTHER): Payer: Medicare Other

## 2017-12-18 DIAGNOSIS — I482 Chronic atrial fibrillation, unspecified: Secondary | ICD-10-CM

## 2017-12-18 DIAGNOSIS — I2510 Atherosclerotic heart disease of native coronary artery without angina pectoris: Secondary | ICD-10-CM

## 2017-12-22 ENCOUNTER — Encounter (INDEPENDENT_AMBULATORY_CARE_PROVIDER_SITE_OTHER): Payer: Medicare Other | Admitting: Clinical Cardiac Electrophysiology

## 2017-12-27 ENCOUNTER — Ambulatory Visit (INDEPENDENT_AMBULATORY_CARE_PROVIDER_SITE_OTHER): Payer: Medicare Other | Admitting: Internal Medicine

## 2017-12-29 ENCOUNTER — Encounter (INDEPENDENT_AMBULATORY_CARE_PROVIDER_SITE_OTHER): Payer: Self-pay | Admitting: Internal Medicine

## 2017-12-29 ENCOUNTER — Ambulatory Visit (INDEPENDENT_AMBULATORY_CARE_PROVIDER_SITE_OTHER): Payer: Medicare Other | Admitting: Internal Medicine

## 2017-12-29 VITALS — BP 122/72 | HR 69 | Ht 72.44 in | Wt 203.0 lb

## 2017-12-29 DIAGNOSIS — I482 Chronic atrial fibrillation, unspecified: Secondary | ICD-10-CM

## 2017-12-29 NOTE — Progress Notes (Signed)
[facility] VIS[facility]      Chief Complaint   Patient presents with   . Results     from bldwk and lexiscan       I had the pleasure of seeing Jonathan [name] today for cardiovascular follow up. He is a pleasant 67 y.o. male with a history of CAD in family who presents for continued management.      Coronary artery disease in family ( Father had CABG under age 65). 2 of his father's brother died of MI    Elevated Pro BNP with normal ECHO and nuclear angiogram. Currently retired.Swims an hour everyday and walks on the weekend.  MEDICATIONS:     Current Outpatient Prescriptions   Medication Sig Dispense Refill   . ALPRAZolam (XANAX) 0.5 MG tablet Take 1 tablet (0.5 mg total) by mouth every 12 (twelve) hours as needed for Anxiety 30 tablet 0   . aspirin 325 MG tablet Take 325 mg by mouth daily     . Fluticasone Propionate (FLONASE NA) 1 puff by Nasal route as needed.     . GUAIFENESIN PO Take 400 mg by mouth 2 (two) times daily.     Marland Kitchen losartan (COZAAR) 50 MG tablet Take 1 tablet (50 mg total) by mouth nightly. 90 tablet 3   . mefloquine (LARIAM) 250 MG tablet Take 1 tablet (250 mg total) by mouth Once each week on Saturday evening.1 week before travel, weekly while away, for 4 weeks after return. 6 tablet 0   . sertraline (ZOLOFT) 50 MG tablet Take 1 tablet (50 mg total) by mouth daily. 90 tablet 2   . warfarin (COUMADIN) 10 MG tablet Take 1 tablet (10 mg total) by mouth nightly.Adjusted for inr 90 tablet 3     No current facility-administered medications for this visit.        REVIEW OF SYSTEMS: All other systems reviewed and negative except as above.    PHYSICAL EXAMINATION  Vital Signs: BP 122/72 (BP Site: Left arm, Patient Position: Sitting, Cuff Size: Large)   Pulse 69   Ht 1.84 m (6' 0.44")   Wt 92.1 kg (203 lb)   BMI 27.20 kg/m    Vital signs reviewed    Wt Readings from Last 3 Encounters:   12/29/17 92.1 kg (203 lb)   12/07/17 90.7 kg (200 lb)   12/04/17 93.2 kg (205 lb 6.4 oz)        General  Appearance:  A well-appearing male in no acute distress.    HEENT: Sclera anicteric, conjunctiva without pallor, moist mucous membranes, normal dentition.   Neck:  Supple without jugular venous distention.  Normal carotid upstrokes without bruits.   Chest: Clear to auscultation bilaterally with good air movement and respiratory effort and no wheezes, rales, or rhonchi   Cardiac: RRR.  Normal S1 and physiologically split S2, without gallops or rub. No murmurs.   Vascular:  2+ carotid, radial pulses bilaterally  Abdomen: Soft, nontender, nondistended, with normoactive bowel sounds.  No bruits.   Extremities: Warm without edema, clubbing, or cyanosis.   Skin: No rash, warm, appropriate for race.   Neuro: Alert and oriented x3. Grossly intact.  CN II-XII intact.  Normal mood and affect.       ASSESSMENT/PLAN:  Elevated Pro BNP with normal ECHO and nuclear angiogram      No signs or symptoms of heart failure      No symptoms of ischemic heart disease      Strong  family history of   Chronic Atrial Fibrillation with ventricular pacing      RTC 6 months    Janace Litten, MD  12/29/2017

## 2018-01-01 ENCOUNTER — Ambulatory Visit
Admission: RE | Admit: 2018-01-01 | Discharge: 2018-01-01 | Disposition: A | Discharge status: Home / Self Care | Payer: Medicare Other | Source: Ambulatory Visit | Attending: Internal Medicine | Admitting: Internal Medicine

## 2018-01-01 DIAGNOSIS — I4891 Unspecified atrial fibrillation: Secondary | ICD-10-CM | POA: Insufficient documentation

## 2018-01-01 LAB — PT/INR
PT INR: 3.4 — ABNORMAL HIGH (ref 0.9–1.1)
PT: 33.8 s — ABNORMAL HIGH (ref 12.6–15.0)

## 2018-01-02 ENCOUNTER — Ambulatory Visit (INDEPENDENT_AMBULATORY_CARE_PROVIDER_SITE_OTHER): Payer: Self-pay

## 2018-01-02 DIAGNOSIS — I482 Chronic atrial fibrillation, unspecified: Secondary | ICD-10-CM

## 2018-01-02 NOTE — Progress Notes (Signed)
Diagnosis: atrial fibrillation/flutter  INR range: 2.0-3.0  Primary cardiologist: Janace Litten, MD  Attention:   ______________________________    INR = 3.4 , date: January 01, 2018, day: Monday  Laboratory:[facility]    Comment:  I left a detailed message on patient's v/m. I requested patient call me back to verify his dosing, so chart may be updated.     New dose? Yes, DECREASED dose tonight.    Glynis Smiles Tue Wed Thu Fri Sat   10 5 5 10 5 10 10      Next INR: 1 week    Billing:         Diagnosis: atrial fibrillation/flutter  INR range: 2.0-3.0  Primary cardiologist: Janace Litten, MD  Attention:   ______________________________    INR = 2.6 , date: October 30, 2017, day: Monday  Laboratory:[facility]    Comment:  I left a detailed message on patient's v/m. I requested patient call me back to verify his dosing, so chart may be updated.     New dose? No    Glynis Smiles Tue Wed Thu Fri Sat   10 5 10 10 5 10 10      Next INR: 1 month(s)    Billing:

## 2018-01-05 ENCOUNTER — Ambulatory Visit (INDEPENDENT_AMBULATORY_CARE_PROVIDER_SITE_OTHER): Payer: Medicare Other | Admitting: Clinical Cardiac Electrophysiology

## 2018-01-05 VITALS — BP 121/73 | HR 65 | Wt 203.0 lb

## 2018-01-05 DIAGNOSIS — I482 Chronic atrial fibrillation, unspecified: Secondary | ICD-10-CM

## 2018-01-05 DIAGNOSIS — Z950 Presence of cardiac pacemaker: Secondary | ICD-10-CM

## 2018-01-05 NOTE — Progress Notes (Signed)
[facility]IS[facility]    Jonathan [name] presents to the office today for leadless pacemaker site check.   He is a 67 yo male with chronic atrial fibrillation and slow ventricular response who underwent SJM Nanostim leadless pacemaker implantation on 06/20/14.      CHA2DS2-VASc 5 for HTN, TIA, CAD, and age > 12.  He is anticoagulated with warfarin.     He has a history of CAD and underwent repeat TTE and stress imaging 11/2015, both of which were normal.  TTE 11/2015 normal LV dimension, EF 55-60%, normal RV, mild BAE, trace MR/TR, mild ascending aorta dilation, 4.2 cm.  Nuc ST 11/2015 (regadenoson) normal perfusion, no ischemia or scar.  He had a follow-up nuclear stress test on May 9.  This showed ejection fraction 32% with no inducible ischemia, fixed apical and inferoseptal defect that may have been infarct or artifact.  Echocardiogram however on May 20 was read as showing estimated ejection fraction 65%.  The echo EF was confirmed on review.     His lower pacing rate was decreased to 50bpm.   He did not note improvement.      He saw Dr. Thelma Barge on May 31.    The patient continues to note exertional dyspnea and fatigue.    PMH:   Patient Active Problem List    Diagnosis Date Noted   . H/O sick sinus syndrome    . Bronchiectasis    . Atrial fibrillation    . Dyspnea on exertion    . Hyperlipidemia    . History of TIA (transient ischemic attack)    . Migraine equivalent    . Neck pain    . Back pain    . Cold hands and feet    . Bilirubinemia    . Anxiety    . History of basal cell cancer    . Cardiac pacemaker in situ--leadless nano stim 07/01/2014   . On anticoagulant therapy 06/12/2014   . Coronary artery disease involving native coronary artery of native heart without angina pectoris 04/17/2014   . Essential hypertension 08/23/2013        MEDICATIONS:    Current Outpatient Prescriptions   Medication Sig Dispense Refill   . ALPRAZolam (XANAX) 0.5 MG tablet Take 1 tablet (0.5 mg total) by mouth every 12 (twelve)  hours as needed for Anxiety 30 tablet 0   . aspirin 325 MG tablet Take 325 mg by mouth daily     . Fluticasone Propionate (FLONASE NA) 1 puff by Nasal route as needed.     . GUAIFENESIN PO Take 400 mg by mouth 2 (two) times daily.     Marland Kitchen losartan (COZAAR) 50 MG tablet Take 1 tablet (50 mg total) by mouth nightly. 90 tablet 3   . mefloquine (LARIAM) 250 MG tablet Take 1 tablet (250 mg total) by mouth Once each week on Saturday evening.1 week before travel, weekly while away, for 4 weeks after return. 6 tablet 0   . sertraline (ZOLOFT) 50 MG tablet Take 1 tablet (50 mg total) by mouth daily. 90 tablet 2   . warfarin (COUMADIN) 10 MG tablet Take 1 tablet (10 mg total) by mouth nightly.Adjusted for inr 90 tablet 3     No current facility-administered medications for this visit.         Meds reviewed, no changes since last visit.    SH:   Social History   Substance Use Topics   . Smoking status: Former Smoker  Packs/day: 1.00     Years: 10.00     Quit date: 03/28/1984   . Smokeless tobacco: Never Used   . Alcohol use Yes      Comment: occasionally       FH: no history of sudden death    REVIEW OF SYSTEMS: All other systems reviewed and negative except as above.    PHYSICAL EXAMINATION  General Appearance: A well-appearing male in no acute distress.   Vital Signs: BP 121/73 (BP Site: Right arm, Patient Position: Sitting, Cuff Size: Large)   Pulse 65   Wt 92.1 kg (203 lb)   BMI 27.20 kg/m    HEENT: Sclera anicteric, conjunctiva without pallor, moist mucous membranes, normal dentition.   Neck: Supple without jugular venous distention. Thyroid nonpalpable. Normal carotid upstrokes without bruits.  Chest: Clear to auscultation bilaterally with good air movement and respiratory effort and no wheezes, rales, or rhonchi  Cardiovascular: Normal S1 and physiologically split S2 without murmurs, gallops or rub. PMI of normal size and nondisplaced.   Abdomen: Soft, nontender. No organomegaly.  No pulsatile masses or bruits.     Extremities: Warm without edema. All peripheral pulses are full and equal.  Chest Wall:  Pacemaker site well healed without evidence of hematoma or infection.  Skin: No rash, xanthoma or xanthelasma.   Neuro: Alert and oriented x3. Grossly intact. Strength is symmetrical. Normal mood and affect.     Pacemaker interrogation/reprogramming:   Normal [employer] single chamber  Leadless pacemaker function with normal sensing/threshold/impedances.  No significant arrhythmias, battery good.  He has underlying atrial fibrillation.  He paces 96% of the time.   Outputs were optimized to preserve battery life.    IMPRESSION/RECOMMENDATIONS: [name] is a 67 y.o. male who presents for follow up. Pacemaker with normal function.  Has exertional fatigue and dyspnea, but this cannot be attributed to any reduction of ejection fraction.  As such, he would not benefit from upgrade to biventricular pacing.    After much discussion, he wants to be less aggressive about the pacing.  We will reduce his lower rate limit from 50 to 45, will reduce the aggressiveness of rate responsiveness.  He will see how he feels.    Monthly TTM

## 2018-01-10 ENCOUNTER — Encounter (INDEPENDENT_AMBULATORY_CARE_PROVIDER_SITE_OTHER): Payer: Self-pay | Admitting: Clinical Cardiac Electrophysiology

## 2018-01-10 ENCOUNTER — Ambulatory Visit (INDEPENDENT_AMBULATORY_CARE_PROVIDER_SITE_OTHER): Payer: Medicare Other | Admitting: Clinical Cardiac Electrophysiology

## 2018-01-10 DIAGNOSIS — I482 Chronic atrial fibrillation, unspecified: Secondary | ICD-10-CM

## 2018-01-10 DIAGNOSIS — Z950 Presence of cardiac pacemaker: Secondary | ICD-10-CM

## 2018-01-10 NOTE — Progress Notes (Signed)
[facility] VISIT    I had the pleasure of seeing Jonathan [name] today for pacemaker and arrhythmia follow up.      He is a 67 yo male with chronic atrial fibrillation and slow ventricular response who underwent SJM Nanostim leadless pacemaker implantation on 06/20/14. The indication was heart rates into the 20s during wakeful hours with wide-complex escape beats.    CHA2DS2-VASc 5 for HTN, TIA, CAD, and age >35. He is anticoagulated with warfarin.     He has a history of CAD and underwent repeat TTE and stress imaging 11/2015, both of which were normal. TTE 11/2015 normal LV dimension, EF 55-60%, normal RV, mild BAE, trace MR/TR, mild ascending aorta dilation, 4.2 cm.  Nuc ST 11/2015 (regadenoson) normal perfusion, no ischemia or scar.  He had a follow-up nuclear stress test on May 9.  This showed ejection fraction 32% with no inducible ischemia, fixed apical and inferoseptal defect that may have been infarct or artifact.  Echocardiogram however on May 20 was read as showing estimated ejection fraction 65%.  The echo EF was confirmed on review.     His lower pacing rate was decreased to 50bpm. He did not note improvement. Last week, the baseline heart rate was decreased to 45, with rate responsiveness made less aggressive.  However, there was also found to be a problem with the battery.  There was difficulty with interrogation, and some marker channel dropout.  This was felt to indicate an impending problem with the battery.    He feels no different since the programming change.    Pacemaker today was programmed to a back-up rate of 40 bpm, without rate responsiveness.        PMH:   Past Medical History:   Diagnosis Date   . Anxiety    . Atrial fibrillation 7/14 dx    holter7/14 rates 30-111, 05/2014 27-146, Avg 41  no pauses >2.5 sec. Holter 10/15 rates in 30s mostly 7 pm to 7 am, > 100 w exercise only   . Back pain     numbness in feet   . Bilirubinemia    . Bronchiectasis    . Cold hands and feet     . Coronary artery disease 01/2014    mild inf ischemia   . Depression    . Dyspnea on exertion    . History of basal cell cancer    . Hyperlipidemia    . Hypertension    . Migraine headache    . Neck pain     numbness in hands   . Pacemaker    . Sick sinus syndrome     s/p pacemaker placement   . Thoracic aortic ectasia 4.30 January 2013, 4.2 12/2014   . TIA (transient ischemic attack)         MEDICATIONS:   Current Outpatient Prescriptions   Medication Sig Dispense Refill   . ALPRAZolam (XANAX) 0.5 MG tablet Take 1 tablet (0.5 mg total) by mouth every 12 (twelve) hours as needed for Anxiety 30 tablet 0   . aspirin 325 MG tablet Take 325 mg by mouth daily     . Fluticasone Propionate (FLONASE NA) 1 puff by Nasal route as needed.     . GUAIFENESIN PO Take 400 mg by mouth 2 (two) times daily.     Marland Kitchen losartan (COZAAR) 50 MG tablet Take 1 tablet (50 mg total) by mouth nightly. 90 tablet 3   . mefloquine (LARIAM) 250 MG tablet Take 1  tablet (250 mg total) by mouth Once each week on Saturday evening.1 week before travel, weekly while away, for 4 weeks after return. 6 tablet 0   . sertraline (ZOLOFT) 50 MG tablet Take 1 tablet (50 mg total) by mouth daily. 90 tablet 2   . warfarin (COUMADIN) 10 MG tablet Take 1 tablet (10 mg total) by mouth nightly.Adjusted for inr 90 tablet 3     No current facility-administered medications for this visit.         Meds reviewed, no changes since last visit.    SH:   Social History   Substance Use Topics   . Smoking status: Former Smoker     Packs/day: 1.00     Years: 10.00     Quit date: 03/28/1984   . Smokeless tobacco: Never Used   . Alcohol use Yes      Comment: occasionally       FH: no history of sudden death.  Family History reviewed and is otherwise not pertinent    REVIEW OF SYSTEMS: All other systems reviewed and negative except as above.    PHYSICAL EXAMINATION  General Appearance: A well-appearing male in no acute distress.   Vital Signs: There were no vitals taken for this visit.    HEENT: Sclera anicteric, conjunctiva without pallor, moist mucous membranes, normal dentition.   Neck: Supple without jugular venous distention. Thyroid nonpalpable. Normal carotid upstrokes without bruits.  Chest: Clear to auscultation bilaterally with good air movement and respiratory effort and no wheezes, rales, or rhonchi  Cardiovascular: Normal S1 and physiologically split S2 without murmurs, gallops or rub. PMI of normal size and nondisplaced.   Abdomen: Soft, nontender. No organomegaly.  No pulsatile masses or bruits.    Extremities: Warm without edema. All peripheral pulses are full and equal.  Skin: No rash, xanthoma or xanthelasma.   Neuro: Alert and oriented x3. Grossly intact. Strength is symmetrical. Normal mood and affect.       IMPRESSION/RECOMMENDATIONS: Jonathan [name] is a 67 y.o. male who presents for follow up.      It is my impression that Jonathan [name] underwent initial pacemaker implantation for appropriate reason.  I have every reason to suspect that he still needs back-up pacing.  It appears that the battery may fail, so he was programmed to a rate of 40 today.  He will return in 2 weeks.  We will reassess if he is requiring any back-up pacing.  If so, that he will need a new pacemaker.    His current device is [employer], and there is no Psychologist, prison and probation services on the market.  He would then have this choice between a traditional transvenous [facility] pacemaker, or a Armed forces logistics/support/administrative officer.  X    Follow-up in 2 weeks.

## 2018-01-12 ENCOUNTER — Ambulatory Visit (INDEPENDENT_AMBULATORY_CARE_PROVIDER_SITE_OTHER): Payer: Medicare Other | Admitting: Internal Medicine

## 2018-01-24 ENCOUNTER — Encounter (INDEPENDENT_AMBULATORY_CARE_PROVIDER_SITE_OTHER): Payer: Self-pay | Admitting: Clinical Cardiac Electrophysiology

## 2018-01-24 ENCOUNTER — Ambulatory Visit (INDEPENDENT_AMBULATORY_CARE_PROVIDER_SITE_OTHER): Payer: Medicare Other | Admitting: Clinical Cardiac Electrophysiology

## 2018-01-24 VITALS — BP 117/60 | HR 30 | Ht 73.0 in | Wt 200.0 lb

## 2018-01-24 DIAGNOSIS — I482 Chronic atrial fibrillation, unspecified: Secondary | ICD-10-CM

## 2018-01-24 DIAGNOSIS — Z950 Presence of cardiac pacemaker: Secondary | ICD-10-CM

## 2018-01-24 DIAGNOSIS — Z8679 Personal history of other diseases of the circulatory system: Secondary | ICD-10-CM

## 2018-01-24 NOTE — Progress Notes (Signed)
[facility] ARRHYTHMIA OFFICE VISIT    I had the pleasure of seeing [name] today for pacemaker and arrhythmia follow up.      He is a 67yo male with chronic atrial fibrillation and slow ventricular response who underwent SJM Nanostim leadless pacemaker implantation on 06/20/14. The indication was heart rates into the 20s during wakeful hours with wide-complex escape beats.    CHA2DS2-VASc 5 for HTN, TIA, CAD, and age >74. He is anticoagulated with warfarin.     He has a history of CAD and underwent repeat TTE and stress imaging 11/2015, both of which were normal. TTE 11/2015 normal LV dimension, EF 55-60%, normal RV, mild BAE, trace MR/TR, mild ascending aorta dilation, 4.2 cm. Nuc ST 11/2015 (regadenoson) normal perfusion, no ischemia or scar. He had a follow-up nuclear stress test on May 9. This showed ejection fraction 32% with no inducible ischemia, fixed apical and inferoseptal defect that may have been infarct or artifact.  Echocardiogram however on May 20 was read as showing estimated ejection fraction 65%. The echo EF was confirmed on review.     His lower pacing rate was decreased to 50bpm. He did not note improvement. Baseline heart rate was decreased to 45, with rate responsiveness made less aggressive.  However, there was also found to be a problem with the battery.  There was difficulty with interrogation, and some marker channel dropout.  This was felt to indicate an impending problem with the battery.  Pacemaker was programmed to a back-up rate of 40 bpm, without rate responsiveness to assess need for pacing.      He is notes no lightheadedness or dizziness, or change of symptoms.  Over the past couple years, he felt no better with the pacemaker that he felt without it.    The device can be interrogated, [facility] pacemaker, but it is not pacing at the programmed rate, cannot perform threshold testing, and for all intent and purposes, it is not working.  The battery is spent.    PMH:    Patient Active Problem List    Diagnosis Date Noted   . H/O sick sinus syndrome    . Bronchiectasis    . Atrial fibrillation    . Dyspnea on exertion    . Hyperlipidemia    . History of TIA (transient ischemic attack)    . Migraine equivalent    . Neck pain    . Back pain    . Cold hands and feet    . Bilirubinemia    . Anxiety    . History of basal cell cancer    . Cardiac pacemaker in situ--leadless nano stim 07/01/2014   . On anticoagulant therapy 06/12/2014   . Coronary artery disease involving native coronary artery of native heart without angina pectoris 04/17/2014   . Essential hypertension 08/23/2013        MEDICATIONS:     Current Outpatient Prescriptions   Medication Sig Dispense Refill   . ALPRAZolam (XANAX) 0.5 MG tablet Take 1 tablet (0.5 mg total) by mouth every 12 (twelve) hours as needed for Anxiety 30 tablet 0   . aspirin 325 MG tablet Take 325 mg by mouth daily     . Fluticasone Propionate (FLONASE NA) 1 puff by Nasal route as needed.     . GUAIFENESIN PO Take 400 mg by mouth 2 (two) times daily.     Marland Kitchen losartan (COZAAR) 50 MG tablet Take 1 tablet (50 mg total) by mouth nightly. 90  tablet 3   . mefloquine (LARIAM) 250 MG tablet Take 1 tablet (250 mg total) by mouth Once each week on Saturday evening.1 week before travel, weekly while away, for 4 weeks after return. 6 tablet 0   . sertraline (ZOLOFT) 50 MG tablet Take 1 tablet (50 mg total) by mouth daily. 90 tablet 2   . warfarin (COUMADIN) 10 MG tablet Take 1 tablet (10 mg total) by mouth nightly.Adjusted for inr 90 tablet 3     No current facility-administered medications for this visit.         Meds reviewed, no changes since last visit.    SH:   Social History   Substance Use Topics   . Smoking status: Former Smoker     Packs/day: 1.00     Years: 10.00     Quit date: 03/28/1984   . Smokeless tobacco: Never Used   . Alcohol use Yes      Comment: occasionally       FH: no history of sudden death    REVIEW OF SYSTEMS: All other systems reviewed  and negative except as above.    PHYSICAL EXAMINATION  General Appearance: A well-appearing male in no acute distress.   Vital Signs: BP 117/60 (BP Site: Left arm, Patient Position: Sitting, Cuff Size: Large)   Pulse (!) 30   Ht 1.854 m (6\' 1" )   Wt 90.7 kg (200 lb)   BMI 26.39 kg/m    HEENT: Sclera anicteric, conjunctiva without pallor, moist mucous membranes, normal dentition.   Neck: Supple without jugular venous distention. Thyroid nonpalpable. Normal carotid upstrokes without bruits.  Chest: Clear to auscultation bilaterally with good air movement and respiratory effort and no wheezes, rales, or rhonchi  Cardiovascular: Normal S1 and physiologically split S2 without murmurs, gallops or rub. PMI of normal size and nondisplaced.   Abdomen: Soft, nontender. No organomegaly.  No pulsatile masses or bruits.    Extremities: Warm without edema. All peripheral pulses are full and equal.  Chest Wall:  Pacemaker site well healed without evidence of hematoma or infection.  Skin: No rash, xanthoma or xanthelasma.   Neuro: Alert and oriented x3. Grossly intact. Strength is symmetrical. Normal mood and affect.     IMPRESSION/RECOMMENDATIONS: Mr. [name] is a 67 y.o. male who presents for follow up. Pacemaker has failed, but the patient is stable.    We know that he has had heart rates into the 20s.  Today, heart rate is in the 30s.  He says that with his watch at home, heart rate with activity is generally about 100 bpm.  At rest, can be 38 to 60 bpm.  Prior to the device, he had heart rates into the 20s.    The options are to either do nothing, pursue another leadless pacemaker, versus pursue a transvenous pacemaker.  As he felt as though a pacemaker never made him feel any better, he wants to wait.    Follow-up in 2 months.

## 2018-02-03 ENCOUNTER — Other Ambulatory Visit (INDEPENDENT_AMBULATORY_CARE_PROVIDER_SITE_OTHER): Payer: Self-pay | Admitting: Family Medicine

## 2018-02-03 DIAGNOSIS — F32A Depression, unspecified: Secondary | ICD-10-CM

## 2018-02-05 MED ORDER — SERTRALINE HCL 50 MG PO TABS
50.00 mg | ORAL_TABLET | Freq: Every day | ORAL | 2 refills | Status: DC
Start: 2018-02-05 — End: 2018-11-12

## 2018-02-15 ENCOUNTER — Other Ambulatory Visit
Admission: RE | Admit: 2018-02-15 | Discharge: 2018-02-15 | Disposition: A | Discharge status: Home / Self Care | Payer: Medicare Other | Source: Ambulatory Visit | Attending: Internal Medicine | Admitting: Internal Medicine

## 2018-02-15 DIAGNOSIS — I4891 Unspecified atrial fibrillation: Secondary | ICD-10-CM | POA: Insufficient documentation

## 2018-02-15 LAB — PT/INR
PT INR: 3.7 — ABNORMAL HIGH (ref 0.9–1.1)
PT: 35.8 s — ABNORMAL HIGH (ref 12.6–15.0)

## 2018-02-16 ENCOUNTER — Ambulatory Visit (INDEPENDENT_AMBULATORY_CARE_PROVIDER_SITE_OTHER): Payer: Self-pay

## 2018-02-16 DIAGNOSIS — I482 Chronic atrial fibrillation, unspecified: Secondary | ICD-10-CM

## 2018-02-16 NOTE — Progress Notes (Signed)
Diagnosis: atrial fibrillation/flutter  INR range: 2.0-3.0  Primary cardiologist: Janace Litten, MD  Attention:   ______________________________    INR = 3.7, date: February 15, 2018, day: Thursday   Laboratory:[place]    Comment:  I left a detailed message on patient's v/m. I requested patient call me back to verify his dosing, so chart may be updated and to let me know if he is on any new meds.    New dose? Yes, DECREASED dose tonight.    Wynelle Link QMV Tue Wed Thu Fri Sat   10 5 5 10 5  HOLD 10     Next INR: 1 week            Diagnosis: atrial fibrillation/flutter  INR range: 2.0-3.0  Primary cardiologist: Janace Litten, MD  Attention:   ______________________________    INR = 3.4, date: January 01, 2018, day: Monday  Laboratory:[place]    Comment:  I left a detailed message on patient's v/m. I requested patient call me back to verify his dosing, so chart may be updated.     New dose? Yes, DECREASED dose tonight.    Glynis Smiles Tue Wed Thu Fri Sat   10 5 5 10 5 10 10      Next INR: 1 week    Billing:

## 2018-03-14 ENCOUNTER — Other Ambulatory Visit (INDEPENDENT_AMBULATORY_CARE_PROVIDER_SITE_OTHER): Payer: Self-pay | Admitting: Family Medicine

## 2018-03-14 DIAGNOSIS — I10 Essential (primary) hypertension: Secondary | ICD-10-CM

## 2018-03-16 ENCOUNTER — Other Ambulatory Visit
Admission: RE | Admit: 2018-03-16 | Discharge: 2018-03-16 | Disposition: A | Discharge status: Home / Self Care | Payer: Medicare Other | Source: Ambulatory Visit | Attending: Internal Medicine | Admitting: Internal Medicine

## 2018-03-16 ENCOUNTER — Telehealth (INDEPENDENT_AMBULATORY_CARE_PROVIDER_SITE_OTHER): Payer: Self-pay | Admitting: Family Medicine

## 2018-03-16 DIAGNOSIS — I4891 Unspecified atrial fibrillation: Secondary | ICD-10-CM | POA: Insufficient documentation

## 2018-03-16 DIAGNOSIS — I10 Essential (primary) hypertension: Secondary | ICD-10-CM

## 2018-03-16 LAB — PT/INR
PT INR: 2.9 — ABNORMAL HIGH (ref 0.9–1.1)
PT: 30.4 s — ABNORMAL HIGH (ref 12.6–15.0)

## 2018-03-19 ENCOUNTER — Telehealth (INDEPENDENT_AMBULATORY_CARE_PROVIDER_SITE_OTHER): Payer: Self-pay

## 2018-03-19 NOTE — Telephone Encounter (Signed)
Jonathan [name], does this medication need to be refilled?

## 2018-03-19 NOTE — Telephone Encounter (Signed)
Pt is advised losartan (COZAAR) 50 MG was refill 03/16/2018.

## 2018-03-19 NOTE — Telephone Encounter (Signed)
I tried contacting patient regarding therapeutic INR of 2.9 done on 03/16/18. Previous INR from one month earlier was elevated at 3.7. I requested patient call me back @ [phone] to let me know how much Coumadin he has been taking so that his chart may be updated.

## 2018-03-20 NOTE — Telephone Encounter (Signed)
No refill needed it pt was requesting but I called I told pt to check his pharmacy it was refilled 03/16/2018

## 2018-03-30 ENCOUNTER — Ambulatory Visit (INDEPENDENT_AMBULATORY_CARE_PROVIDER_SITE_OTHER): Payer: Medicare Other | Admitting: Clinical Cardiac Electrophysiology

## 2018-03-30 ENCOUNTER — Encounter (INDEPENDENT_AMBULATORY_CARE_PROVIDER_SITE_OTHER): Payer: Self-pay | Admitting: Clinical Cardiac Electrophysiology

## 2018-03-30 VITALS — BP 100/61 | Ht 73.0 in | Wt 192.0 lb

## 2018-03-30 DIAGNOSIS — I482 Chronic atrial fibrillation, unspecified: Secondary | ICD-10-CM

## 2018-03-30 DIAGNOSIS — Z7901 Long term (current) use of anticoagulants: Secondary | ICD-10-CM

## 2018-03-30 NOTE — Progress Notes (Signed)
[facility] ARRHYTHMIA OFFICE VISIT    I had the pleasure of seeing Mr.Lewistoday for pacemaker and arrhythmia follow up.     He is a 67yo male with chronic atrial fibrillation and slow ventricular response who underwent SJM Nanostim leadless pacemaker implantation on 06/20/14. The indication was heart rates into the 20s during wakeful hours with wide-complex escape beats.    CHA2DS2-VASc 5 for HTN, TIA, CAD, and age >57. He is anticoagulated with warfarin.     He has a history of CAD and underwent repeat TTE and stress imaging 11/2015, both of which were normal. TTE 11/2015 normal LV dimension, EF 55-60%, normal RV, mild BAE, trace MR/TR, mild ascending aorta dilation, 4.2 cm. Nuc ST 11/2015 (regadenoson) normal perfusion, no ischemia or scar. He had a follow-up nuclear stress test on May 9. This showed ejection fraction 32% with no inducible ischemia, fixed apical and inferoseptal defect that may have been infarct or artifact.  Echocardiogram however on May 20 was read as showing estimated ejection fraction 65%. The echo EF was confirmed on review.     His lower pacing rate was decreased to 50bpm. He did not note improvement. Baseline heart rate was decreased to 45, with rate responsiveness made less aggressive. However, there was also found to be a problem with the battery. There was difficulty with interrogation, and some marker channel dropout. This was felt to indicate an impending problem with the battery.  Pacemaker was programmed to a back-up rate of 40 bpm, without rate responsiveness to assess need for pacing.      At last visit in June, the device was not pacing at the programmed rate, could not perform threshold testing, and for all intent and purposes, it was not working.    However, he did not feel as though his symptoms warranted another pacemaker implantation.  Wanted to see how he was doing.    He has not had any episodes of lightheadedness or dizziness.  He does have some exertional  dyspnea, but says that he is able to swim and is not limited.      PMH:   Past Medical History:   Diagnosis Date   . Anxiety    . Atrial fibrillation 7/14 dx    holter7/14 rates 30-111, 05/2014 27-146, Avg 41  no pauses >2.5 sec. Holter 10/15 rates in 30s mostly 7 pm to 7 am, > 100 w exercise only   . Back pain     numbness in feet   . Bilirubinemia    . Bronchiectasis    . Cold hands and feet    . Coronary artery disease 01/2014    mild inf ischemia   . Depression    . Dyspnea on exertion    . History of basal cell cancer    . Hyperlipidemia    . Hypertension    . Migraine headache    . Neck pain     numbness in hands   . Pacemaker    . Sick sinus syndrome     s/p pacemaker placement   . Thoracic aortic ectasia 4.30 January 2013, 4.2 12/2014   . TIA (transient ischemic attack)         MEDICATIONS:   Current Outpatient Prescriptions   Medication Sig Dispense Refill   . ALPRAZolam (XANAX) 0.5 MG tablet Take 1 tablet (0.5 mg total) by mouth every 12 (twelve) hours as needed for Anxiety 30 tablet 0   . aspirin 325 MG tablet Take 325 mg by mouth  daily     . GUAIFENESIN PO Take 400 mg by mouth 2 (two) times daily.     Marland Kitchen losartan (COZAAR) 50 MG tablet TAKE ONE TABLET BY MOUTH nightly 90 tablet 1   . sertraline (ZOLOFT) 50 MG tablet Take 1 tablet (50 mg total) by mouth daily 90 tablet 2   . warfarin (COUMADIN) 10 MG tablet Take 1 tablet (10 mg total) by mouth nightly.Adjusted for inr 90 tablet 3   . Fluticasone Propionate (FLONASE NA) 1 puff by Nasal route as needed.       No current facility-administered medications for this visit.         Meds reviewed, no changes since last visit.    SH:   Social History   Substance Use Topics   . Smoking status: Former Smoker     Packs/day: 1.00     Years: 10.00     Quit date: 03/28/1984   . Smokeless tobacco: Never Used   . Alcohol use Yes      Comment: occasionally       FH: no history of sudden death.  Family History reviewed and is otherwise not pertinent    REVIEW OF SYSTEMS: All other  systems reviewed and negative except as above.    PHYSICAL EXAMINATION  General Appearance: A well-appearing male in no acute distress.   Vital Signs: BP 100/61   Ht 1.854 m (6\' 1" )   Wt 87.1 kg (192 lb)   BMI 25.33 kg/m  44 bpm  HEENT: Sclera anicteric, conjunctiva without pallor, moist mucous membranes, normal dentition.   Neck: Supple without jugular venous distention. Thyroid nonpalpable. Normal carotid upstrokes without bruits.  Chest: Clear to auscultation bilaterally with good air movement and respiratory effort and no wheezes, rales, or rhonchi  Cardiovascular: Normal S1 and physiologically split S2 without murmurs, gallops or rub. PMI of normal size and nondisplaced.   Abdomen: Soft, nontender. No organomegaly.  No pulsatile masses or bruits.    Extremities: Warm without edema. All peripheral pulses are full and equal.  Skin: No rash, xanthoma or xanthelasma.   Neuro: Alert and oriented x3. Grossly intact. Strength is symmetrical. Normal mood and affect.     IMPRESSION/RECOMMENDATIONS: Mr. Thain is a 67 y.o. male who presents for follow up.      Chronic atrial fibrillation.  Failed single-chamber leadless pacemaker.  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  He is comfortable waiting without pursuing another pacemaker.  I have asked him to contact us if he starts to feel any lightheadedness, limitations that he considers bothersome.  Follow up in 3 months.    We also discussed intervention for atrial fibrillation, but he has been in it consistently for 5 to 10 years.

## 2018-04-23 ENCOUNTER — Ambulatory Visit
Admission: RE | Admit: 2018-04-23 | Discharge: 2018-04-23 | Disposition: A | Discharge status: Home / Self Care | Payer: Medicare Other | Source: Ambulatory Visit | Attending: Internal Medicine | Admitting: Internal Medicine

## 2018-04-23 ENCOUNTER — Ambulatory Visit (INDEPENDENT_AMBULATORY_CARE_PROVIDER_SITE_OTHER): Payer: Self-pay

## 2018-04-23 ENCOUNTER — Encounter (INDEPENDENT_AMBULATORY_CARE_PROVIDER_SITE_OTHER): Payer: Self-pay | Admitting: Family Medicine

## 2018-04-23 DIAGNOSIS — I482 Chronic atrial fibrillation, unspecified: Secondary | ICD-10-CM

## 2018-04-23 DIAGNOSIS — I4891 Unspecified atrial fibrillation: Secondary | ICD-10-CM | POA: Insufficient documentation

## 2018-04-23 LAB — EXTERNAL PT/INR: PT INR: 3.6 — AB (ref <=1.1)

## 2018-04-23 LAB — PT/INR
PT INR: 3.6 — ABNORMAL HIGH (ref 0.9–1.1)
PT: 35.9 s — ABNORMAL HIGH (ref 12.6–15.0)

## 2018-04-25 MED ORDER — WARFARIN SODIUM 10 MG PO TABS
10.00 mg | ORAL_TABLET | Freq: Every evening | ORAL | 3 refills | Status: DC
Start: 2018-04-25 — End: 2019-03-20

## 2018-04-30 ENCOUNTER — Encounter (INDEPENDENT_AMBULATORY_CARE_PROVIDER_SITE_OTHER): Payer: Self-pay | Admitting: Family Medicine

## 2018-05-04 ENCOUNTER — Other Ambulatory Visit (INDEPENDENT_AMBULATORY_CARE_PROVIDER_SITE_OTHER): Payer: Self-pay | Admitting: Family Medicine

## 2018-05-04 ENCOUNTER — Ambulatory Visit (INDEPENDENT_AMBULATORY_CARE_PROVIDER_SITE_OTHER): Payer: Medicare Other | Admitting: Family Medicine

## 2018-05-04 ENCOUNTER — Telehealth (INDEPENDENT_AMBULATORY_CARE_PROVIDER_SITE_OTHER): Payer: Self-pay | Admitting: Family Medicine

## 2018-05-04 DIAGNOSIS — J479 Bronchiectasis, uncomplicated: Secondary | ICD-10-CM

## 2018-05-04 MED ORDER — ALBUTEROL SULFATE 108 (90 BASE) MCG/ACT IN AEPB
2.00 | INHALATION_SPRAY | RESPIRATORY_TRACT | 3 refills | Status: DC | PRN
Start: 2018-05-04 — End: 2018-05-07

## 2018-05-04 NOTE — Telephone Encounter (Signed)
TJ, can you reschedule him to next Tuesday in the afternoon, Carolina Sink will not be here this afternoon.

## 2018-05-04 NOTE — Telephone Encounter (Signed)
Dr Brooke Dare , pt schedule for appt today @3 :30pm pt confirm appt

## 2018-05-07 ENCOUNTER — Ambulatory Visit (INDEPENDENT_AMBULATORY_CARE_PROVIDER_SITE_OTHER): Payer: Medicare Other | Admitting: Family Medicine

## 2018-05-07 ENCOUNTER — Encounter (INDEPENDENT_AMBULATORY_CARE_PROVIDER_SITE_OTHER): Payer: Self-pay | Admitting: Family Medicine

## 2018-05-07 VITALS — BP 120/58 | HR 36 | Temp 97.2°F | Ht 72.0 in | Wt 199.4 lb

## 2018-05-07 DIAGNOSIS — J479 Bronchiectasis, uncomplicated: Secondary | ICD-10-CM

## 2018-05-07 DIAGNOSIS — Z23 Encounter for immunization: Secondary | ICD-10-CM

## 2018-05-07 DIAGNOSIS — I10 Essential (primary) hypertension: Secondary | ICD-10-CM

## 2018-05-07 MED ORDER — PROAIR RESPICLICK 108 (90 BASE) MCG/ACT IN AEPB
2.00 | INHALATION_SPRAY | RESPIRATORY_TRACT | 2 refills | Status: DC | PRN
Start: 2018-05-07 — End: 2018-07-05

## 2018-05-07 NOTE — Progress Notes (Signed)
Have you seen any specialists/other providers since your last visit with [place]?    Yes    Arm preference verified?   No    Health Maintenance Due   Topic Date Due   . COLONOSCOPY TEN YEARS  Mar 09, 1950   . [facility] CARE PLAN LETTER  12/20/49   . Spirometry every Two Years  November 23, 1949   . Shingrix Vaccine 50+ (1) 09/14/1999   . Pneumonia Vaccine Age 68+ (2 of 2 - PPSV23) 05/05/2016   . FALLS RISK ANNUAL  02/06/2018   . DEPRESSION SCREENING  02/06/2018   . INFLUENZA VACCINE  03/01/2018

## 2018-05-07 NOTE — Progress Notes (Signed)
Subjective:      Patient ID: Jonathan [name] is a 67 y.o. male.    Chief Complaint:  Chief Complaint   Patient presents with   . PFT     f/u       HPI:  HPI    Spirometry: presents to have screening spirometry done. H/o1ppd for about 10 years.     Bronchiectasis: follow up. Needs new critp for Porair respclick. Pharmacy gave him ventolin.      The following sections were reviewed this encounter by the provider:   Allergies  Meds  Problems         Current Medications:  Current Outpatient Medications   Medication Sig Dispense Refill   . ALPRAZolam (XANAX) 0.5 MG tablet Take 1 tablet (0.5 mg total) by mouth every 12 (twelve) hours as needed for Anxiety 30 tablet 0   . aspirin 325 MG tablet Take 325 mg by mouth daily     . GUAIFENESIN PO Take 400 mg by mouth 2 (two) times daily.     Marland Kitchen losartan (COZAAR) 50 MG tablet TAKE ONE TABLET BY MOUTH nightly 90 tablet 1   . sertraline (ZOLOFT) 50 MG tablet Take 1 tablet (50 mg total) by mouth daily 90 tablet 2   . warfarin (COUMADIN) 10 MG tablet Take 1 tablet (10 mg total) by mouth nightly Adjusted for inr 90 tablet 3   . Fluticasone Propionate (FLONASE NA) 1 puff by Nasal route as needed.     Marland Kitchen PROAIR RESPICLICK 108 (90 Base) MCG/ACT Aerosol Powder, Breath Activtivatede Inhale 2 puffs into the lungs every 4 (four) hours as needed (SOB, chest tightness, cough) 1 each 2     No current facility-administered medications for this visit.        Allergies:  No Known Allergies      ROS:  Review of Systems   Constitutional: Negative for activity change.   Eyes: Negative for visual disturbance.   Respiratory: Positive for cough (worsens with exertion/exercise. h/o bronchiectasis). Negative for shortness of breath.    Cardiovascular: Negative for chest pain and palpitations.   Musculoskeletal: Negative for myalgias.   Neurological: Negative for dizziness, facial asymmetry, speech difficulty and weakness.        Objective:     Vitals:  BP 120/58 (BP Site: Right arm, Patient Position:  Sitting, Cuff Size: Medium)   Pulse (!) 36   Temp 97.2 F (36.2 C) (Oral)   Ht 1.829 m (6')   Wt 90.4 kg (199 lb 6.4 oz)   BMI 27.04 kg/m     Physical Exam:  Physical Exam  Vitals signs reviewed.   Constitutional:       General: He is not in acute distress.     Appearance: He is well-developed.   Eyes:      General: No scleral icterus.     Conjunctiva/sclera: Conjunctivae normal.   Neck:      Musculoskeletal: Neck supple.   Cardiovascular:      Rate and Rhythm: Normal rate and regular rhythm.      Heart sounds: Normal heart sounds. No murmur.   Pulmonary:      Effort: Pulmonary effort is normal. No respiratory distress.      Breath sounds: Normal breath sounds.   Neurological:      Mental Status: He is alert and oriented to person, place, and time.      Motor: No abnormal muscle tone.         Assessment:  1. Essential hypertension    2. Bronchiectasis without complication  - PROAIR RESPICLICK 108 (90 Base) MCG/ACT Aerosol Powder, Breath Activtivatede; Inhale 2 puffs into the lungs every 4 (four) hours as needed (SOB, chest tightness, cough)  Dispense: 1 each; Refill: 2    3. Needs flu shot  - Flu vacc quad recombinant pres free 18 yrs& up (Flublok)      Plan:     Chronic. Controlled on losartan 50mg  once daily.     Chronic. Stable. Brand Proair respiclick prescribed. Spirometry normal. Copy scanned to chart.     Flublok flu shot given, he was last vaccinated against flu years ago.     Return in about 6 months (around 11/06/2018) for annual physical.    Emelda Brothers, MD

## 2018-05-28 ENCOUNTER — Encounter (INDEPENDENT_AMBULATORY_CARE_PROVIDER_SITE_OTHER): Payer: Self-pay | Admitting: Family Medicine

## 2018-05-30 ENCOUNTER — Encounter (INDEPENDENT_AMBULATORY_CARE_PROVIDER_SITE_OTHER): Payer: Self-pay | Admitting: Family Medicine

## 2018-06-04 ENCOUNTER — Encounter (INDEPENDENT_AMBULATORY_CARE_PROVIDER_SITE_OTHER): Payer: Self-pay | Admitting: Family Medicine

## 2018-06-06 ENCOUNTER — Other Ambulatory Visit (INDEPENDENT_AMBULATORY_CARE_PROVIDER_SITE_OTHER): Payer: Self-pay | Admitting: Family Medicine

## 2018-06-06 DIAGNOSIS — M545 Low back pain, unspecified: Secondary | ICD-10-CM

## 2018-06-06 MED ORDER — LIDOCAINE 5 % EX PTCH
1.00 | MEDICATED_PATCH | CUTANEOUS | 1 refills | Status: DC
Start: 2018-06-06 — End: 2018-07-05

## 2018-06-06 MED ORDER — METHYLPREDNISOLONE 4 MG PO TBPK
ORAL_TABLET | ORAL | 0 refills | Status: DC
Start: 2018-06-06 — End: 2018-07-05

## 2018-06-07 ENCOUNTER — Other Ambulatory Visit
Admission: RE | Admit: 2018-06-07 | Discharge: 2018-06-07 | Disposition: A | Discharge status: Home / Self Care | Payer: Medicare Other | Source: Ambulatory Visit | Attending: Internal Medicine | Admitting: Internal Medicine

## 2018-06-07 DIAGNOSIS — I4891 Unspecified atrial fibrillation: Secondary | ICD-10-CM | POA: Insufficient documentation

## 2018-06-07 LAB — PT/INR
PT INR: 2.9 — ABNORMAL HIGH (ref 0.9–1.1)
PT: 30.8 s — ABNORMAL HIGH (ref 12.6–15.0)

## 2018-06-08 ENCOUNTER — Telehealth (INDEPENDENT_AMBULATORY_CARE_PROVIDER_SITE_OTHER): Payer: Self-pay

## 2018-06-08 NOTE — Telephone Encounter (Signed)
I tried contacting patient regarding therapeutic INR of 2.9 done yesterday, 06/07/18. Previous INR from 04/23/18, which was elevated so dose held one day. I l/m on patient's v/m inquiring as to what he has been taking and advised patient to recheck his INR in 2 weeks.

## 2018-06-20 ENCOUNTER — Encounter (INDEPENDENT_AMBULATORY_CARE_PROVIDER_SITE_OTHER): Payer: Self-pay

## 2018-07-05 ENCOUNTER — Ambulatory Visit (INDEPENDENT_AMBULATORY_CARE_PROVIDER_SITE_OTHER): Payer: Medicare Other | Admitting: Clinical Cardiac Electrophysiology

## 2018-07-05 ENCOUNTER — Encounter (INDEPENDENT_AMBULATORY_CARE_PROVIDER_SITE_OTHER): Payer: Self-pay | Admitting: Clinical Cardiac Electrophysiology

## 2018-07-05 VITALS — BP 119/58 | HR 42 | Wt 199.0 lb

## 2018-07-05 DIAGNOSIS — R001 Bradycardia, unspecified: Secondary | ICD-10-CM

## 2018-07-05 DIAGNOSIS — I482 Chronic atrial fibrillation, unspecified: Secondary | ICD-10-CM

## 2018-07-05 DIAGNOSIS — Z950 Presence of cardiac pacemaker: Secondary | ICD-10-CM

## 2018-07-05 DIAGNOSIS — Z7901 Long term (current) use of anticoagulants: Secondary | ICD-10-CM

## 2018-07-05 NOTE — Progress Notes (Signed)
[facility] ARRHYTHMIA OFFICE VISIT    I had the pleasure of seeing [name] for pacemaker and arrhythmia follow up.     He is a 67yo male with chronic atrial fibrillation and slow ventricular response who underwent SJM Nanostim leadless pacemaker implantation on 06/20/14. The indication was heart rates into the 20s during wakeful hours with wide-complex escape beats.    CHA2DS2-VASc 5 for HTN, TIA, CAD, and age >24. He is anticoagulated with warfarin.     He has a history of CAD and underwent repeat TTE and stress imaging 11/2015, both of which were normal. TTE 11/2015 normal LV dimension, EF 55-60%, normal RV, mild BAE, trace MR/TR, mild ascending aorta dilation, 4.2 cm. Nuc ST 11/2015 (regadenoson) normal perfusion, no ischemia or scar. He had a follow-up nuclear stress test on Dec 04, 2017. This showed ejection fraction 32% with no inducible ischemia, fixed apical and inferoseptal defect that may have been infarct or artifact.  Echocardiogram however on Dec 18, 2017 was read as showing estimated ejection fraction 65%. The echo EF was confirmed on review.     He noticed no change in symptoms when the pacemaker rate was decreased, and when rate responsiveness was made less aggressive.  However, the pacemaker has failed.  In June, the device was not pacing at the programmed rate, could not perform threshold testing, and for all intent and purposes, it was not working.   He did not feel as though his symptoms warranted another pacemaker implantation. He wanted to see how he was doing.    When last seen in August, he was comfortable waiting without pursuing another pacemaker.    With his apple watch, he says that he has had times, at rest, when his heart rate has remained under 40 bpm for 10 minutes.  However, this was not associated with any lightheadedness or fatigue.  With exercise, his heart rate goes up to 130 bpm.     He is not inclined to pursue another pacemaker, because he felt no better when he  had the last one and it was working.    PMH:   Past Medical History:   Diagnosis Date   . Anxiety    . Atrial fibrillation 7/14 dx    holter7/14 rates 30-111, 05/2014 27-146, Avg 41  no pauses >2.5 sec. Holter 10/15 rates in 30s mostly 7 pm to 7 am, > 100 w exercise only   . Back pain     numbness in feet   . Bilirubinemia    . Bronchiectasis    . Cold hands and feet    . Coronary artery disease 01/2014    mild inf ischemia   . Depression    . Dyspnea on exertion    . History of basal cell cancer    . Hyperlipidemia    . Hypertension    . Migraine headache    . Neck pain     numbness in hands   . Pacemaker    . Sick sinus syndrome     s/p pacemaker placement   . Thoracic aortic ectasia 4.30 January 2013, 4.2 12/2014   . TIA (transient ischemic attack)         MEDICATIONS:   Current Outpatient Medications   Medication Sig Dispense Refill   . ALPRAZolam (XANAX) 0.5 MG tablet Take 1 tablet (0.5 mg total) by mouth every 12 (twelve) hours as needed for Anxiety 30 tablet 0   . aspirin 325 MG tablet Take 325 mg by mouth  daily     . Fluticasone Propionate (FLONASE NA) 1 puff by Nasal route as needed.     . GUAIFENESIN PO Take 400 mg by mouth 2 (two) times daily.     Marland Kitchen losartan (COZAAR) 50 MG tablet TAKE ONE TABLET BY MOUTH nightly 90 tablet 1   . sertraline (ZOLOFT) 50 MG tablet Take 1 tablet (50 mg total) by mouth daily 90 tablet 2   . warfarin (COUMADIN) 10 MG tablet Take 1 tablet (10 mg total) by mouth nightly Adjusted for inr 90 tablet 3     No current facility-administered medications for this visit.         Meds reviewed, no changes since last visit.    SH:   Social History     Tobacco Use   . Smoking status: Former Smoker     Packs/day: 1.00     Years: 10.00     Pack years: 10.00     Last attempt to quit: 03/28/1984     Years since quitting: 34.2   . Smokeless tobacco: Never Used   Substance Use Topics   . Alcohol use: Yes     Comment: occasionally   . Drug use: No       FH: no history of sudden death.  Family History  reviewed and is otherwise not pertinent    REVIEW OF SYSTEMS: All other systems reviewed and negative except as above.    PHYSICAL EXAMINATION  General Appearance: A well-appearing male in no acute distress.   Vital Signs: BP 119/58 (BP Site: Left arm, Patient Position: Sitting, Cuff Size: Large)   Pulse (!) 42   Wt 90.3 kg (199 lb)   BMI 26.99 kg/m    HEENT: Sclera anicteric, conjunctiva without pallor, moist mucous membranes, normal dentition.   Neck: Supple without jugular venous distention. Thyroid nonpalpable. Normal carotid upstrokes without bruits.  Chest: Clear to auscultation bilaterally with good air movement and respiratory effort and no wheezes, rales, or rhonchi  Cardiovascular: Normal S1 and physiologically split S2 without murmurs, gallops or rub. PMI of normal size and nondisplaced.   Abdomen: Soft, nontender. No organomegaly.  No pulsatile masses or bruits.    Extremities: Warm without edema. All peripheral pulses are full and equal.  Skin: No rash, xanthoma or xanthelasma.   Neuro: Alert and oriented x3. Grossly intact. Strength is symmetrical. Normal mood and affect.       ECG: atrial fibrillation, rate 47.      IMPRESSION/RECOMMENDATIONS: Jonathan [name] is a 67 y.o. male who presents for follow up.      The patient had undergone a pacemaker for compelling bradycardia.  However, he felt no better with the pacemaker.  With its failure, he still has compellingly low heart rates, but is disinclined to pursue another pacemaker until he is more symptomatic.    Follow-up in 6 months.

## 2018-07-06 ENCOUNTER — Ambulatory Visit
Admission: RE | Admit: 2018-07-06 | Discharge: 2018-07-06 | Disposition: A | Discharge status: Home / Self Care | Payer: Medicare Other | Source: Ambulatory Visit | Attending: Internal Medicine | Admitting: Internal Medicine

## 2018-07-06 DIAGNOSIS — I4891 Unspecified atrial fibrillation: Secondary | ICD-10-CM | POA: Insufficient documentation

## 2018-07-06 LAB — PT/INR
PT INR: 1.9 — ABNORMAL HIGH (ref 0.9–1.1)
PT: 21.9 s — ABNORMAL HIGH (ref 12.6–15.0)

## 2018-07-09 ENCOUNTER — Telehealth (INDEPENDENT_AMBULATORY_CARE_PROVIDER_SITE_OTHER): Payer: Self-pay

## 2018-07-09 NOTE — Telephone Encounter (Signed)
I tried contacting patient regarding INR of 1.9 done 07/06/18. No answer. L/M on v/m inquiring if patient missed a dose of had more vitamin K foods recently. I  informed patient in v/m that he needs to repeat his INR in one week and requested he call me to discuss INR/Coumadin dosing.

## 2018-07-16 ENCOUNTER — Encounter (INDEPENDENT_AMBULATORY_CARE_PROVIDER_SITE_OTHER): Payer: Medicare Other | Admitting: Clinical Cardiac Electrophysiology

## 2018-07-18 ENCOUNTER — Encounter (INDEPENDENT_AMBULATORY_CARE_PROVIDER_SITE_OTHER): Payer: Self-pay

## 2018-08-08 ENCOUNTER — Encounter (INDEPENDENT_AMBULATORY_CARE_PROVIDER_SITE_OTHER): Payer: Self-pay

## 2018-09-05 ENCOUNTER — Other Ambulatory Visit
Admission: RE | Admit: 2018-09-05 | Discharge: 2018-09-05 | Disposition: A | Discharge status: Home / Self Care | Payer: Medicare Other | Source: Ambulatory Visit | Attending: Internal Medicine | Admitting: Internal Medicine

## 2018-09-05 DIAGNOSIS — I4891 Unspecified atrial fibrillation: Secondary | ICD-10-CM | POA: Insufficient documentation

## 2018-09-05 LAB — PT/INR
PT INR: 1.9 — ABNORMAL HIGH (ref 0.9–1.1)
PT: 22.2 s — ABNORMAL HIGH (ref 12.6–15.0)

## 2018-09-07 ENCOUNTER — Ambulatory Visit (INDEPENDENT_AMBULATORY_CARE_PROVIDER_SITE_OTHER): Payer: Medicare Other

## 2018-09-07 DIAGNOSIS — I482 Chronic atrial fibrillation, unspecified: Secondary | ICD-10-CM

## 2018-09-07 NOTE — Patient Instructions (Signed)
Spoke with pt who verbalized understanding of dosing and retesting instructions.

## 2018-09-10 ENCOUNTER — Encounter (INDEPENDENT_AMBULATORY_CARE_PROVIDER_SITE_OTHER): Payer: Self-pay | Admitting: Clinical Cardiac Electrophysiology

## 2018-09-10 ENCOUNTER — Telehealth (INDEPENDENT_AMBULATORY_CARE_PROVIDER_SITE_OTHER): Payer: Self-pay | Admitting: Clinical Cardiac Electrophysiology

## 2018-09-10 NOTE — Telephone Encounter (Signed)
Pt was called, left message on cell phone [phone] re: no longer in Nanostim protocol.     Jonathan Grates, MD, [facility], [facility]  [facility] Arrhythmia  Spectralink x [phone]  [phone]

## 2018-09-12 ENCOUNTER — Encounter (INDEPENDENT_AMBULATORY_CARE_PROVIDER_SITE_OTHER): Payer: Self-pay

## 2018-09-17 ENCOUNTER — Other Ambulatory Visit (INDEPENDENT_AMBULATORY_CARE_PROVIDER_SITE_OTHER): Payer: Self-pay | Admitting: Family Medicine

## 2018-09-17 DIAGNOSIS — I10 Essential (primary) hypertension: Secondary | ICD-10-CM

## 2018-09-18 ENCOUNTER — Encounter (INDEPENDENT_AMBULATORY_CARE_PROVIDER_SITE_OTHER): Payer: Self-pay | Admitting: Family Medicine

## 2018-09-18 NOTE — Telephone Encounter (Signed)
Last filled  August 2019.   Last o/v October 2019. Patient has an appointment on November 19 2018 with Dr. Brooke Dare.  Queued up 90 day supply with 0 refills.  Further refills to be addressed at next appointment. Refill as appropriate.

## 2018-09-19 MED ORDER — LOSARTAN POTASSIUM 50 MG PO TABS
50.00 mg | ORAL_TABLET | Freq: Every evening | ORAL | 0 refills | Status: DC
Start: 2018-09-19 — End: 2018-12-18

## 2018-09-20 ENCOUNTER — Other Ambulatory Visit (INDEPENDENT_AMBULATORY_CARE_PROVIDER_SITE_OTHER): Payer: Self-pay | Admitting: Family Medicine

## 2018-09-20 DIAGNOSIS — E7849 Other hyperlipidemia: Secondary | ICD-10-CM

## 2018-09-20 DIAGNOSIS — R7303 Prediabetes: Secondary | ICD-10-CM

## 2018-09-20 DIAGNOSIS — I10 Essential (primary) hypertension: Secondary | ICD-10-CM

## 2018-10-07 ENCOUNTER — Encounter (INDEPENDENT_AMBULATORY_CARE_PROVIDER_SITE_OTHER): Payer: Self-pay

## 2018-10-10 ENCOUNTER — Encounter (INDEPENDENT_AMBULATORY_CARE_PROVIDER_SITE_OTHER): Payer: Self-pay | Admitting: Family Medicine

## 2018-11-05 ENCOUNTER — Encounter (INDEPENDENT_AMBULATORY_CARE_PROVIDER_SITE_OTHER): Payer: Self-pay | Admitting: Family Medicine

## 2018-11-07 ENCOUNTER — Encounter (INDEPENDENT_AMBULATORY_CARE_PROVIDER_SITE_OTHER): Payer: Self-pay

## 2018-11-08 ENCOUNTER — Other Ambulatory Visit (INDEPENDENT_AMBULATORY_CARE_PROVIDER_SITE_OTHER): Payer: Self-pay | Admitting: Family Medicine

## 2018-11-08 DIAGNOSIS — F32A Depression, unspecified: Secondary | ICD-10-CM

## 2018-11-09 NOTE — Telephone Encounter (Signed)
S/w pt schedule video appt 04/13

## 2018-11-09 NOTE — Telephone Encounter (Signed)
Please schedule video visit with provider.

## 2018-11-12 ENCOUNTER — Encounter (INDEPENDENT_AMBULATORY_CARE_PROVIDER_SITE_OTHER): Payer: Self-pay | Admitting: Family Medicine

## 2018-11-12 ENCOUNTER — Telehealth (INDEPENDENT_AMBULATORY_CARE_PROVIDER_SITE_OTHER): Payer: Medicare Other | Admitting: Family Medicine

## 2018-11-12 DIAGNOSIS — F32A Depression, unspecified: Secondary | ICD-10-CM

## 2018-11-12 DIAGNOSIS — F419 Anxiety disorder, unspecified: Secondary | ICD-10-CM

## 2018-11-12 DIAGNOSIS — F329 Major depressive disorder, single episode, unspecified: Secondary | ICD-10-CM

## 2018-11-12 MED ORDER — SERTRALINE HCL 50 MG PO TABS
50.00 mg | ORAL_TABLET | Freq: Every day | ORAL | 0 refills | Status: DC
Start: 2018-11-12 — End: 2019-03-20

## 2018-11-12 NOTE — Progress Notes (Signed)
Subjective:      Patient ID: Jonathan [name] is a 68 y.o. male.    Chief Complaint:  Chief Complaint   Patient presents with   . Anxiety     f/u and med refill       HPI:  HPI    Verbal consent has been obtained from the patient to conduct a video and telephone visit to minimize exposure to COVID-19: yes.    Anxiety and depression  Follow up. On sertraline 50mg  daily. He's started a self wean. Presently alternating 50mg  with 25mg  daily. Finds this regimen effective. Endorses good mood. No self harm, SI, HI.     The following sections were reviewed this encounter by the provider:   Allergies  Meds  Problems         Current Medications:  Current Outpatient Medications   Medication Sig Dispense Refill   . aspirin 325 MG tablet Take 325 mg by mouth daily     . Fluticasone Propionate (FLONASE NA) 1 puff by Nasal route as needed.     . GUAIFENESIN PO Take 400 mg by mouth 2 (two) times daily.     Marland Kitchen losartan (COZAAR) 50 MG tablet Take 1 tablet (50 mg total) by mouth nightly 90 tablet 0   . sertraline (ZOLOFT) 50 MG tablet Take 1 tablet (50 mg total) by mouth daily 90 tablet 0   . warfarin (COUMADIN) 10 MG tablet Take 1 tablet (10 mg total) by mouth nightly Adjusted for inr 90 tablet 3   . ALPRAZolam (XANAX) 0.5 MG tablet Take 1 tablet (0.5 mg total) by mouth every 12 (twelve) hours as needed for Anxiety 30 tablet 0     No current facility-administered medications for this visit.        Allergies:  No Known Allergies      ROS:  Review of Systems   Constitutional: Negative for activity change.   Eyes: Negative for visual disturbance.   Respiratory: Negative for cough and shortness of breath.    Cardiovascular: Negative for chest pain and palpitations.   Musculoskeletal: Negative for myalgias.   Neurological: Negative for dizziness, facial asymmetry, speech difficulty and weakness.   Psychiatric/Behavioral: Negative for dysphoric mood. The patient is nervous/anxious.         Objective:     Vitals:  There were no vitals taken  for this visit.    Physical Exam:  Physical Exam  Constitutional:       General: He is not in acute distress.     Appearance: He is not ill-appearing.   Eyes:      General: No scleral icterus.     Extraocular Movements: Extraocular movements intact.   Pulmonary:      Effort: Pulmonary effort is normal. No respiratory distress.   Neurological:      Mental Status: He is alert and oriented to person, place, and time.         Assessment:       1. Anxiety and depression  - sertraline (ZOLOFT) 50 MG tablet; Take 1 tablet (50 mg total) by mouth daily  Dispense: 90 tablet; Refill: 0      Plan:     Chronic. Improved on sertraline. He wants to continue with the 50mg  tablets and he'll continue to alternate the dose and wean.     Time spent in discussion: 5-10 minutes    Return in about 4 years (around 11/12/2022) for Medicare Wellness.    Emelda Brothers, MD

## 2018-11-12 NOTE — Progress Notes (Signed)
Have you seen any specialists/other providers since your last visit with [place]?    No    Arm preference verified?   Yes, no preference    Health Maintenance Due   Topic Date Due   . COLONOSCOPY TEN YEARS  August 10, 1950   . [facility] CARE PLAN LETTER  1951/06/06   . Spirometry every Two Years  1950-10-14   . Pneumonia Vaccine Age 69+ (2 of 2 - PPSV23) 05/05/2016   . FALLS RISK ANNUAL  02/06/2018   . DEPRESSION SCREENING  02/06/2018   . Shingrix Vaccine 50+ (2) 08/17/2018

## 2018-11-19 ENCOUNTER — Ambulatory Visit (INDEPENDENT_AMBULATORY_CARE_PROVIDER_SITE_OTHER): Payer: Medicare Other | Admitting: Family Medicine

## 2018-11-26 ENCOUNTER — Ambulatory Visit
Admission: RE | Admit: 2018-11-26 | Discharge: 2018-11-26 | Disposition: A | Discharge status: Home / Self Care | Payer: Medicare Other | Source: Ambulatory Visit | Attending: Internal Medicine | Admitting: Internal Medicine

## 2018-11-26 ENCOUNTER — Telehealth (INDEPENDENT_AMBULATORY_CARE_PROVIDER_SITE_OTHER): Payer: Self-pay | Admitting: Family Medicine

## 2018-11-26 DIAGNOSIS — I4891 Unspecified atrial fibrillation: Secondary | ICD-10-CM | POA: Insufficient documentation

## 2018-11-26 LAB — PT/INR
PT INR: 1.4 — ABNORMAL HIGH (ref 0.9–1.1)
PT: 17.4 s — ABNORMAL HIGH (ref 12.6–15.0)

## 2018-11-26 NOTE — Telephone Encounter (Signed)
Patient forgot to mention that he wanted to do a blood oxygen level test. Would like to know if he could get an order for that.       [phone] (M)

## 2018-11-27 ENCOUNTER — Ambulatory Visit (INDEPENDENT_AMBULATORY_CARE_PROVIDER_SITE_OTHER): Payer: Self-pay

## 2018-11-27 NOTE — Patient Instructions (Signed)
LVM for pt instructing him to increase dose by 10 mg tonight (was previously 0 mg on Tuesdays). Impressed the importance or retesting on Thursday and more frequent testing in the future. Left return phone number and direct ext.

## 2018-11-28 ENCOUNTER — Telehealth (INDEPENDENT_AMBULATORY_CARE_PROVIDER_SITE_OTHER): Payer: Medicare Other | Admitting: Family Medicine

## 2018-11-28 DIAGNOSIS — L03115 Cellulitis of right lower limb: Secondary | ICD-10-CM

## 2018-11-28 MED ORDER — CEPHALEXIN 500 MG PO CAPS
500.00 mg | ORAL_CAPSULE | Freq: Two times a day (BID) | ORAL | 0 refills | Status: AC
Start: 2018-11-28 — End: 2018-12-08

## 2018-11-29 NOTE — Progress Notes (Signed)
Subjective:      Patient ID: Jonathan [name] is a 68 y.o. male.    Chief Complaint:  Chief Complaint   Patient presents with   . Cellulitis     right lower leg redness.       HPI:  Verbal consent has been obtained from the patient to conduct a video and telephone visit to minimize exposure to COVID-19: yes    HPI    Cellulitis RLE  New. Started a few days ago. Reports redness and mild heat to lower portion of right lower leg. No weeping, crusting, bleeding. No swelling. No pain. He hasn't done anything to improve his symptoms. He took a Building services engineer of his right lower leg and uploaded the photo for review.     The following sections were reviewed this encounter by the provider:   Allergies  Meds  Problems         Current Medications:  Current Outpatient Medications   Medication Sig Dispense Refill   . ALPRAZolam (XANAX) 0.5 MG tablet Take 1 tablet (0.5 mg total) by mouth every 12 (twelve) hours as needed for Anxiety 30 tablet 0   . aspirin 325 MG tablet Take 325 mg by mouth daily     . cephalexin (KEFLEX) 500 MG capsule Take 1 capsule (500 mg total) by mouth 2 (two) times daily for 10 days 20 capsule 0   . Fluticasone Propionate (FLONASE NA) 1 puff by Nasal route as needed.     . GUAIFENESIN PO Take 400 mg by mouth 2 (two) times daily.     Marland Kitchen losartan (COZAAR) 50 MG tablet Take 1 tablet (50 mg total) by mouth nightly 90 tablet 0   . sertraline (ZOLOFT) 50 MG tablet Take 1 tablet (50 mg total) by mouth daily 90 tablet 0   . warfarin (COUMADIN) 10 MG tablet Take 1 tablet (10 mg total) by mouth nightly Adjusted for inr 90 tablet 3     No current facility-administered medications for this visit.        Allergies:  No Known Allergies      ROS:  Review of Systems   Constitutional: Negative for activity change and fever.   Respiratory: Negative for cough and shortness of breath.    Cardiovascular: Negative for chest pain.   Skin: Positive for rash (redness to front portion of lower right leg).        Objective:     Vitals:  There  were no vitals taken for this visit.    Physical Exam:  Physical Exam  Constitutional:       General: He is not in acute distress.     Appearance: He is not ill-appearing.   Eyes:      General: No scleral icterus.     Extraocular Movements: Extraocular movements intact.      Conjunctiva/sclera: Conjunctivae normal.   Neck:      Musculoskeletal: Neck supple.   Pulmonary:      Effort: Pulmonary effort is normal. No respiratory distress.   Musculoskeletal:      Right lower leg: He exhibits no tenderness, no bony tenderness, no swelling and no deformity. No edema.        Legs:    Neurological:      Mental Status: He is alert and oriented to person, place, and time.         Assessment:       1. Cellulitis of right lower extremity  - cephalexin (KEFLEX) 500 MG capsule;  Take 1 capsule (500 mg total) by mouth 2 (two) times daily for 10 days  Dispense: 20 capsule; Refill: 0      Plan:     New. Start 10d course keflex. Discussed with patient the discoloration could also represent peripheral vascular dermatitis. Continue to monitor.     Time spent in discussion: 5-10 minutes    Return in about 1 week (around 12/05/2018) for if symptoms worsen or fail to improve or sooner.    Emelda Brothers, MD

## 2018-12-07 ENCOUNTER — Encounter (INDEPENDENT_AMBULATORY_CARE_PROVIDER_SITE_OTHER): Payer: Self-pay

## 2018-12-18 ENCOUNTER — Encounter (INDEPENDENT_AMBULATORY_CARE_PROVIDER_SITE_OTHER): Payer: Self-pay | Admitting: Family Medicine

## 2018-12-18 ENCOUNTER — Other Ambulatory Visit (INDEPENDENT_AMBULATORY_CARE_PROVIDER_SITE_OTHER): Payer: Self-pay | Admitting: Family Medicine

## 2018-12-18 DIAGNOSIS — I10 Essential (primary) hypertension: Secondary | ICD-10-CM

## 2018-12-18 NOTE — Telephone Encounter (Signed)
This medication has already been APPROVED by Dr. Brooke Dare on May 19,2020.  There is no need to reorder Rx refill at this time. Please see attached details.  No further actions necessary at this time.    Date: 12/18/2018 Department: [facility] [place] Ordering/Authorizing: Emelda Brothers, MD   Outpatient Medication Detail      Disp Refills Start End    losartan (COZAAR) 50 MG tablet 90 tablet 1 12/18/2018     Sig: TAKE ONE TABLET BY MOUTH nightly    Sent to pharmacy as: losartan (COZAAR) 50 MG tablet    E-Prescribing Status: Receipt confirmed by pharmacy (12/18/2018 8:15 AM EDT)    Associated Diagnoses     Essential hypertension       Pharmacy     [id number] - [place], [place] - [address]

## 2018-12-21 MED ORDER — LOSARTAN POTASSIUM 50 MG PO TABS
50.00 mg | ORAL_TABLET | Freq: Every evening | ORAL | 2 refills | Status: DC
Start: 2018-12-21 — End: 2019-03-20

## 2018-12-27 ENCOUNTER — Encounter (INDEPENDENT_AMBULATORY_CARE_PROVIDER_SITE_OTHER): Payer: Self-pay | Admitting: Internal Medicine

## 2018-12-27 ENCOUNTER — Ambulatory Visit
Admission: RE | Admit: 2018-12-27 | Discharge: 2018-12-27 | Disposition: A | Discharge status: Home / Self Care | Payer: Medicare Other | Source: Ambulatory Visit | Attending: Cardiovascular Disease | Admitting: Cardiovascular Disease

## 2018-12-27 ENCOUNTER — Telehealth (INDEPENDENT_AMBULATORY_CARE_PROVIDER_SITE_OTHER): Payer: Self-pay

## 2018-12-27 ENCOUNTER — Other Ambulatory Visit (INDEPENDENT_AMBULATORY_CARE_PROVIDER_SITE_OTHER): Payer: Self-pay

## 2018-12-27 DIAGNOSIS — I482 Chronic atrial fibrillation, unspecified: Secondary | ICD-10-CM

## 2018-12-27 DIAGNOSIS — I2510 Atherosclerotic heart disease of native coronary artery without angina pectoris: Secondary | ICD-10-CM

## 2018-12-27 LAB — PT/INR
PT INR: 1.4 — ABNORMAL HIGH (ref 0.9–1.1)
PT: 16.8 s — ABNORMAL HIGH (ref 12.6–15.0)

## 2018-12-27 NOTE — Telephone Encounter (Signed)
Spoke to pt, confirmed msg rec'd. Per pt, his request has already been taken care of by Dr. Loma Newton.

## 2019-01-07 ENCOUNTER — Encounter (INDEPENDENT_AMBULATORY_CARE_PROVIDER_SITE_OTHER): Payer: Self-pay

## 2019-01-07 ENCOUNTER — Inpatient Hospital Stay
Admission: RE | Admit: 2019-01-07 | Discharge: 2019-01-07 | Disposition: A | Discharge status: Home / Self Care | Payer: Medicare Other | Source: Ambulatory Visit | Attending: Cardiovascular Disease | Admitting: Cardiovascular Disease

## 2019-01-07 DIAGNOSIS — I482 Chronic atrial fibrillation, unspecified: Secondary | ICD-10-CM | POA: Insufficient documentation

## 2019-01-07 LAB — PT/INR
PT INR: 1.6 — ABNORMAL HIGH (ref 0.9–1.1)
PT: 19.2 s — ABNORMAL HIGH (ref 12.6–15.0)

## 2019-01-14 ENCOUNTER — Ambulatory Visit (INDEPENDENT_AMBULATORY_CARE_PROVIDER_SITE_OTHER): Payer: Medicare Other | Admitting: Family Medicine

## 2019-01-14 ENCOUNTER — Ambulatory Visit (INDEPENDENT_AMBULATORY_CARE_PROVIDER_SITE_OTHER): Payer: Medicare Other

## 2019-01-14 DIAGNOSIS — I482 Chronic atrial fibrillation, unspecified: Secondary | ICD-10-CM

## 2019-01-14 NOTE — Patient Instructions (Signed)
Did not receive INR in time. LVM for pt to ask if any changes were made 01/14/19. Asked pt to retest today. Call back number and extension provided.

## 2019-01-17 ENCOUNTER — Ambulatory Visit (INDEPENDENT_AMBULATORY_CARE_PROVIDER_SITE_OTHER): Payer: Medicare Other | Admitting: Clinical Cardiac Electrophysiology

## 2019-01-31 ENCOUNTER — Encounter (INDEPENDENT_AMBULATORY_CARE_PROVIDER_SITE_OTHER): Payer: Self-pay | Admitting: Clinical Cardiac Electrophysiology

## 2019-01-31 ENCOUNTER — Telehealth (INDEPENDENT_AMBULATORY_CARE_PROVIDER_SITE_OTHER): Payer: Medicare Other | Admitting: Clinical Cardiac Electrophysiology

## 2019-01-31 VITALS — Ht 73.0 in | Wt 199.0 lb

## 2019-01-31 DIAGNOSIS — I482 Chronic atrial fibrillation, unspecified: Secondary | ICD-10-CM

## 2019-01-31 DIAGNOSIS — Z7901 Long term (current) use of anticoagulants: Secondary | ICD-10-CM

## 2019-01-31 NOTE — Progress Notes (Signed)
[facility] ARRHYTHMIA VIDEO OFFICE VISIT    I had the pleasure of seeing Mr. Daubert today for outpatient arrhythmia management.    He is a 68yo male with chronic atrial fibrillation and slow ventricular response who underwent SJM Nanostim leadless pacemaker implantation on 06/20/14. He had heart rates into the 20s during wakeful hours with wide-complex escape beats.    CHA2DS2-VASc 5 for HTN, TIA, CAD, and age >34. He is anticoagulated with warfarin.     He has a history of CAD and underwent repeat TTE and stress imaging 11/2015, both of which were normal. TTE 11/2015 normal LV dimension, EF 55-60%, normal RV, mild BAE, trace MR/TR, mild ascending aorta dilation, 4.2 cm. Nuc ST 11/2015 (regadenoson) normal perfusion, no ischemia or scar. He had a follow-up nuclear stress test on Dec 04, 2017. This showed ejection fraction 32% with no inducible ischemia, fixed apical and inferoseptal defect that may have been infarct or artifact.  Echocardiogram however on Dec 18, 2017 was read as showing estimated ejection fraction 65%. The echo EF was confirmed on review.     He noticed no change in symptoms when the pacemaker rate was decreased, and when rate responsiveness was made less aggressive.  However, the pacemaker failed.  In June, 2019,  the devicewasnot pacing at the programmed rate,could notperform threshold testing, and for all intent and purposes, itwas not working.He did not feel as though his symptoms warranted another pacemaker implantation. He wanted to see how he was doing.  When last seen in August, 2019, he was comfortable waiting without pursuing another pacemaker.    With his apple watch, his heart rate has not gone under 60 bpm.  With exercise, his heart rate goes up to 130 bpm.     Consent: Verbal consent has been obtained from the patient to conduct a video visit encounter to minimize exposure to COVID-19. The time spent in medical discussion during this visit was 15 minutes.    PMH:   Patient  Active Problem List    Diagnosis Date Noted   . H/O sick sinus syndrome    . Bronchiectasis    . Atrial fibrillation    . Dyspnea on exertion    . Hyperlipidemia    . History of TIA (transient ischemic attack)    . Migraine equivalent    . Neck pain    . Back pain    . Cold hands and feet    . Bilirubinemia    . Anxiety    . History of basal cell cancer    . Cardiac pacemaker in situ--leadless nano stim 07/01/2014   . On anticoagulant therapy 06/12/2014   . Coronary artery disease involving native coronary artery of native heart without angina pectoris 04/17/2014   . Essential hypertension 08/23/2013        MEDICATIONS:     Current Outpatient Medications   Medication Sig Dispense Refill   . ALPRAZolam (XANAX) 0.5 MG tablet Take 1 tablet (0.5 mg total) by mouth every 12 (twelve) hours as needed for Anxiety 30 tablet 0   . aspirin 325 MG tablet Take 325 mg by mouth daily     . Fluticasone Propionate (FLONASE NA) 1 puff by Nasal route as needed.     . GUAIFENESIN PO Take 400 mg by mouth 2 (two) times daily.     Marland Kitchen losartan (COZAAR) 50 MG tablet Take 1 tablet (50 mg total) by mouth nightly 90 tablet 2   . sertraline (ZOLOFT) 50 MG tablet Take  1 tablet (50 mg total) by mouth daily 90 tablet 0   . warfarin (COUMADIN) 10 MG tablet Take 1 tablet (10 mg total) by mouth nightly Adjusted for inr 90 tablet 3     No current facility-administered medications for this visit.         Meds reviewed, no changes since last visit.    SH:   Social History     Tobacco Use   . Smoking status: Former Smoker     Packs/day: 1.00     Years: 10.00     Pack years: 10.00     Last attempt to quit: 03/28/1984     Years since quitting: 34.8   . Smokeless tobacco: Never Used   Substance Use Topics   . Alcohol use: Yes     Comment: occasionally   . Drug use: No       FH: no history of sudden death    REVIEW OF SYSTEMS: All other systems reviewed and negative except as above.    PHYSICAL EXAMINATION  General Appearance: A well-appearing male in no acute  distress.     Vital Signs: Ht 1.854 m (6\' 1" )   Wt 90.3 kg (199 lb)   BMI 26.25 kg/m    The following limited physical exam components were observed through video  General Appearance:  Alert, cooperative, no distress, appears stated age   Head:  Normocephalic, without obvious abnormality, atraumatic   Lungs:   Breathing comfortably without accessory muscle use. Normal respiratory effort.    Eyes:  Sclera anicteric   Musculoskeletal: No apparent gait or station abnormalities    Skin: Normal skin color, texture, no visible rashes or lesions   Neurologic: Alert and oriented x3, normal mood and affect. Grossly intact.     IMPRESSION/RECOMMENDATIONS: Mr. Grayer is a 68 y.o. male who presents for follow up.     Failed leadless pacemaker, but he is having no symptoms, and is disinclined to pursue another pacemaker.  His own determination of heart rates with his apple watch have been unremarkable.    Follow-up in 1 year.  a

## 2019-02-06 ENCOUNTER — Encounter (INDEPENDENT_AMBULATORY_CARE_PROVIDER_SITE_OTHER): Payer: Self-pay

## 2019-02-22 ENCOUNTER — Telehealth (INDEPENDENT_AMBULATORY_CARE_PROVIDER_SITE_OTHER): Payer: Self-pay

## 2019-02-22 NOTE — Telephone Encounter (Signed)
Per patient's wife patient has a Estate agent device (do no see documentation of this in last OV).  She is wondering if they are MRI compatible.  Gave her number for [facility] customer service as I do not have the rep's # and no one else is in the office clinical wise.

## 2019-03-06 ENCOUNTER — Inpatient Hospital Stay: Payer: Medicare Other

## 2019-03-06 ENCOUNTER — Inpatient Hospital Stay
Admission: RE | Admit: 2019-03-06 | Discharge: 2019-03-20 | DRG: 945 | Disposition: A | Discharge status: Home Health | Payer: Medicare Other | Source: Other Acute Inpatient Hospital | Attending: Pain Medicine | Admitting: Pain Medicine

## 2019-03-06 DIAGNOSIS — S069X9A Unspecified intracranial injury with loss of consciousness of unspecified duration, initial encounter: Secondary | ICD-10-CM | POA: Diagnosis present

## 2019-03-06 DIAGNOSIS — H532 Diplopia: Secondary | ICD-10-CM | POA: Diagnosis present

## 2019-03-06 DIAGNOSIS — S27322D Contusion of lung, bilateral, subsequent encounter: Secondary | ICD-10-CM

## 2019-03-06 DIAGNOSIS — G9389 Other specified disorders of brain: Secondary | ICD-10-CM | POA: Clinically undetermined

## 2019-03-06 DIAGNOSIS — Z7901 Long term (current) use of anticoagulants: Secondary | ICD-10-CM

## 2019-03-06 DIAGNOSIS — Z818 Family history of other mental and behavioral disorders: Secondary | ICD-10-CM

## 2019-03-06 DIAGNOSIS — I2510 Atherosclerotic heart disease of native coronary artery without angina pectoris: Secondary | ICD-10-CM | POA: Diagnosis present

## 2019-03-06 DIAGNOSIS — R590 Localized enlarged lymph nodes: Secondary | ICD-10-CM | POA: Diagnosis present

## 2019-03-06 DIAGNOSIS — J90 Pleural effusion, not elsewhere classified: Secondary | ICD-10-CM | POA: Diagnosis not present

## 2019-03-06 DIAGNOSIS — Z8249 Family history of ischemic heart disease and other diseases of the circulatory system: Secondary | ICD-10-CM

## 2019-03-06 DIAGNOSIS — E875 Hyperkalemia: Secondary | ICD-10-CM | POA: Diagnosis not present

## 2019-03-06 DIAGNOSIS — I482 Chronic atrial fibrillation, unspecified: Secondary | ICD-10-CM | POA: Diagnosis present

## 2019-03-06 DIAGNOSIS — F064 Anxiety disorder due to known physiological condition: Secondary | ICD-10-CM | POA: Diagnosis present

## 2019-03-06 DIAGNOSIS — M48061 Spinal stenosis, lumbar region without neurogenic claudication: Secondary | ICD-10-CM | POA: Diagnosis present

## 2019-03-06 DIAGNOSIS — S06369D Traumatic hemorrhage of cerebrum, unspecified, with loss of consciousness of unspecified duration, subsequent encounter: Principal | ICD-10-CM

## 2019-03-06 DIAGNOSIS — S22038D Other fracture of third thoracic vertebra, subsequent encounter for fracture with routine healing: Secondary | ICD-10-CM

## 2019-03-06 DIAGNOSIS — S270XXD Traumatic pneumothorax, subsequent encounter: Secondary | ICD-10-CM

## 2019-03-06 DIAGNOSIS — Z79899 Other long term (current) drug therapy: Secondary | ICD-10-CM

## 2019-03-06 DIAGNOSIS — Z7982 Long term (current) use of aspirin: Secondary | ICD-10-CM

## 2019-03-06 DIAGNOSIS — G4700 Insomnia, unspecified: Secondary | ICD-10-CM | POA: Diagnosis present

## 2019-03-06 DIAGNOSIS — M545 Low back pain, unspecified: Secondary | ICD-10-CM

## 2019-03-06 DIAGNOSIS — E785 Hyperlipidemia, unspecified: Secondary | ICD-10-CM | POA: Diagnosis present

## 2019-03-06 DIAGNOSIS — R569 Unspecified convulsions: Secondary | ICD-10-CM | POA: Diagnosis present

## 2019-03-06 DIAGNOSIS — H9190 Unspecified hearing loss, unspecified ear: Secondary | ICD-10-CM | POA: Diagnosis present

## 2019-03-06 DIAGNOSIS — J45909 Unspecified asthma, uncomplicated: Secondary | ICD-10-CM

## 2019-03-06 DIAGNOSIS — Z825 Family history of asthma and other chronic lower respiratory diseases: Secondary | ICD-10-CM

## 2019-03-06 DIAGNOSIS — Z87891 Personal history of nicotine dependence: Secondary | ICD-10-CM

## 2019-03-06 DIAGNOSIS — I495 Sick sinus syndrome: Secondary | ICD-10-CM | POA: Diagnosis present

## 2019-03-06 DIAGNOSIS — R200 Anesthesia of skin: Secondary | ICD-10-CM | POA: Diagnosis present

## 2019-03-06 DIAGNOSIS — D72828 Other elevated white blood cell count: Secondary | ICD-10-CM | POA: Diagnosis present

## 2019-03-06 DIAGNOSIS — J942 Hemothorax: Secondary | ICD-10-CM | POA: Diagnosis not present

## 2019-03-06 DIAGNOSIS — S2241XD Multiple fractures of ribs, right side, subsequent encounter for fracture with routine healing: Secondary | ICD-10-CM

## 2019-03-06 DIAGNOSIS — Z8673 Personal history of transient ischemic attack (TIA), and cerebral infarction without residual deficits: Secondary | ICD-10-CM

## 2019-03-06 DIAGNOSIS — Z952 Presence of prosthetic heart valve: Secondary | ICD-10-CM

## 2019-03-06 DIAGNOSIS — S062X9D Diffuse traumatic brain injury with loss of consciousness of unspecified duration, subsequent encounter: Secondary | ICD-10-CM

## 2019-03-06 DIAGNOSIS — J479 Bronchiectasis, uncomplicated: Secondary | ICD-10-CM | POA: Diagnosis present

## 2019-03-06 DIAGNOSIS — S069XAA Unspecified intracranial injury with loss of consciousness status unknown, initial encounter: Secondary | ICD-10-CM | POA: Diagnosis present

## 2019-03-06 DIAGNOSIS — I10 Essential (primary) hypertension: Secondary | ICD-10-CM

## 2019-03-06 DIAGNOSIS — Z950 Presence of cardiac pacemaker: Secondary | ICD-10-CM

## 2019-03-06 DIAGNOSIS — Z85828 Personal history of other malignant neoplasm of skin: Secondary | ICD-10-CM

## 2019-03-06 DIAGNOSIS — R531 Weakness: Secondary | ICD-10-CM | POA: Diagnosis present

## 2019-03-06 DIAGNOSIS — S271XXD Traumatic hemothorax, subsequent encounter: Secondary | ICD-10-CM

## 2019-03-06 DIAGNOSIS — R561 Post traumatic seizures: Secondary | ICD-10-CM

## 2019-03-06 DIAGNOSIS — G8929 Other chronic pain: Secondary | ICD-10-CM

## 2019-03-06 DIAGNOSIS — S36112D Contusion of liver, subsequent encounter: Secondary | ICD-10-CM

## 2019-03-06 DIAGNOSIS — K5900 Constipation, unspecified: Secondary | ICD-10-CM

## 2019-03-06 LAB — URINALYSIS, REFLEX TO MICROSCOPIC EXAM IF INDICATED
Bilirubin, UA: NEGATIVE
Blood, UA: NEGATIVE
Glucose, UA: NEGATIVE
Ketones UA: NEGATIVE
Leukocyte Esterase, UA: NEGATIVE
Nitrite, UA: NEGATIVE
Protein, UR: NEGATIVE
Specific Gravity UA: 1.009 (ref 1.001–1.035)
Urine pH: 6 (ref 5.0–8.0)
Urobilinogen, UA: NEGATIVE mg/dL (ref 0.2–2.0)

## 2019-03-06 MED ORDER — LIDOCAINE 5 % EX PTCH
1.00 | MEDICATED_PATCH | CUTANEOUS | Status: DC
Start: 2019-03-06 — End: 2019-03-20
  Administered 2019-03-06 – 2019-03-19 (×11): 1 via TRANSDERMAL
  Filled 2019-03-06 (×12): qty 1

## 2019-03-06 MED ORDER — SERTRALINE HCL 50 MG PO TABS
25.0000 mg | ORAL_TABLET | Freq: Every day | ORAL | Status: DC
Start: 2019-03-07 — End: 2019-03-11
  Administered 2019-03-07 – 2019-03-11 (×5): 25 mg via ORAL
  Filled 2019-03-06 (×5): qty 1

## 2019-03-06 MED ORDER — GABAPENTIN 300 MG PO CAPS
300.00 mg | ORAL_CAPSULE | Freq: Three times a day (TID) | ORAL | Status: DC
Start: 2019-03-06 — End: 2019-03-20
  Administered 2019-03-06 – 2019-03-20 (×35): 300 mg via ORAL
  Filled 2019-03-06 (×40): qty 1

## 2019-03-06 MED ORDER — TRAZODONE HCL 50 MG PO TABS
50.0000 mg | ORAL_TABLET | Freq: Every evening | ORAL | Status: DC
Start: 2019-03-06 — End: 2019-03-20
  Administered 2019-03-06 – 2019-03-19 (×14): 50 mg via ORAL
  Filled 2019-03-06 (×14): qty 1

## 2019-03-06 MED ORDER — LEVETIRACETAM 500 MG PO TABS
750.00 mg | ORAL_TABLET | Freq: Two times a day (BID) | ORAL | Status: DC
Start: 2019-03-06 — End: 2019-03-09
  Administered 2019-03-06 – 2019-03-09 (×7): 750 mg via ORAL
  Filled 2019-03-06 (×7): qty 2

## 2019-03-06 MED ORDER — MELATONIN 3 MG PO TABS
3.0000 mg | ORAL_TABLET | Freq: Every evening | ORAL | Status: DC | PRN
Start: 2019-03-06 — End: 2019-03-20
  Administered 2019-03-08 – 2019-03-18 (×8): 3 mg via ORAL
  Filled 2019-03-06 (×8): qty 1

## 2019-03-06 MED ORDER — HYDROCODONE-ACETAMINOPHEN 5-325 MG PO TABS
1.0000 | ORAL_TABLET | ORAL | Status: DC | PRN
Start: 2019-03-06 — End: 2019-03-20
  Administered 2019-03-06 – 2019-03-19 (×37): 1 via ORAL
  Filled 2019-03-06 (×39): qty 1

## 2019-03-06 MED ORDER — PANTOPRAZOLE SODIUM 40 MG PO TBEC
40.00 mg | DELAYED_RELEASE_TABLET | Freq: Every morning | ORAL | Status: DC
Start: 2019-03-07 — End: 2019-03-18
  Administered 2019-03-07 – 2019-03-18 (×11): 40 mg via ORAL
  Filled 2019-03-06 (×12): qty 1

## 2019-03-06 MED ORDER — CETIRIZINE HCL 10 MG PO TABS
10.00 mg | ORAL_TABLET | Freq: Every day | ORAL | Status: DC
Start: 2019-03-07 — End: 2019-03-20
  Administered 2019-03-07 – 2019-03-20 (×14): 10 mg via ORAL
  Filled 2019-03-06 (×14): qty 1

## 2019-03-06 MED ORDER — BISACODYL 10 MG RE SUPP
10.00 mg | Freq: Two times a day (BID) | RECTAL | Status: DC | PRN
Start: 2019-03-06 — End: 2019-03-20

## 2019-03-06 MED ORDER — POLYETHYLENE GLYCOL 3350 17 G PO PACK
17.00 g | PACK | Freq: Every day | ORAL | Status: DC
Start: 2019-03-07 — End: 2019-03-20
  Administered 2019-03-07 – 2019-03-20 (×9): 17 g via ORAL
  Filled 2019-03-06 (×12): qty 1

## 2019-03-06 MED ORDER — HEPARIN SODIUM (PORCINE) 5000 UNIT/ML IJ SOLN
5000.00 [IU] | Freq: Three times a day (TID) | INTRAMUSCULAR | Status: DC
Start: 2019-03-06 — End: 2019-03-11
  Administered 2019-03-06 – 2019-03-11 (×15): 5000 [IU] via SUBCUTANEOUS
  Filled 2019-03-06 (×15): qty 1

## 2019-03-06 MED ORDER — GUAIFENESIN ER 600 MG PO TB12
600.00 mg | ORAL_TABLET | Freq: Two times a day (BID) | ORAL | Status: DC
Start: 2019-03-06 — End: 2019-03-20
  Administered 2019-03-06 – 2019-03-20 (×29): 600 mg via ORAL
  Filled 2019-03-06 (×29): qty 1

## 2019-03-06 MED ORDER — LOSARTAN POTASSIUM 25 MG PO TABS
50.00 mg | ORAL_TABLET | Freq: Every day | ORAL | Status: DC
Start: 2019-03-07 — End: 2019-03-08
  Administered 2019-03-07 – 2019-03-08 (×2): 50 mg via ORAL
  Filled 2019-03-06 (×2): qty 2

## 2019-03-06 MED ORDER — ACETAMINOPHEN 500 MG PO TABS
500.0000 mg | ORAL_TABLET | Freq: Four times a day (QID) | ORAL | Status: DC | PRN
Start: 2019-03-06 — End: 2019-03-20
  Administered 2019-03-07 – 2019-03-18 (×9): 500 mg via ORAL
  Filled 2019-03-06 (×11): qty 1
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
## 2019-03-07 ENCOUNTER — Inpatient Hospital Stay: Payer: Medicare Other

## 2019-03-07 DIAGNOSIS — F329 Major depressive disorder, single episode, unspecified: Secondary | ICD-10-CM

## 2019-03-07 DIAGNOSIS — Z952 Presence of prosthetic heart valve: Secondary | ICD-10-CM

## 2019-03-07 DIAGNOSIS — J942 Hemothorax: Secondary | ICD-10-CM

## 2019-03-07 DIAGNOSIS — E785 Hyperlipidemia, unspecified: Secondary | ICD-10-CM

## 2019-03-07 DIAGNOSIS — Z8679 Personal history of other diseases of the circulatory system: Secondary | ICD-10-CM

## 2019-03-07 DIAGNOSIS — Y9355 Activity, bike riding: Secondary | ICD-10-CM

## 2019-03-07 DIAGNOSIS — S062X4D Diffuse traumatic brain injury with loss of consciousness of 6 hours to 24 hours, subsequent encounter: Secondary | ICD-10-CM

## 2019-03-07 DIAGNOSIS — Z8673 Personal history of transient ischemic attack (TIA), and cerebral infarction without residual deficits: Secondary | ICD-10-CM

## 2019-03-07 DIAGNOSIS — I10 Essential (primary) hypertension: Secondary | ICD-10-CM

## 2019-03-07 LAB — CBC AND DIFFERENTIAL
Absolute NRBC: 0 10*3/uL (ref 0.00–0.00)
Basophils Absolute Automated: 0.07 10*3/uL (ref 0.00–0.08)
Basophils Automated: 0.6 %
Eosinophils Absolute Automated: 0.23 10*3/uL (ref 0.00–0.44)
Eosinophils Automated: 1.9 %
Hematocrit: 36.6 % — ABNORMAL LOW (ref 37.6–49.6)
Hgb: 12.4 g/dL — ABNORMAL LOW (ref 12.5–17.1)
Immature Granulocytes Absolute: 0.43 10*3/uL — ABNORMAL HIGH (ref 0.00–0.07)
Immature Granulocytes: 3.6 %
Lymphocytes Absolute Automated: 1.95 10*3/uL (ref 0.42–3.22)
Lymphocytes Automated: 16.5 %
MCH: 29.7 pg (ref 25.1–33.5)
MCHC: 33.9 g/dL (ref 31.5–35.8)
MCV: 87.6 fL (ref 78.0–96.0)
MPV: 9.3 fL (ref 8.9–12.5)
Monocytes Absolute Automated: 1.28 10*3/uL — ABNORMAL HIGH (ref 0.21–0.85)
Monocytes: 10.8 %
Neutrophils Absolute: 7.86 10*3/uL — ABNORMAL HIGH (ref 1.10–6.33)
Neutrophils: 66.6 %
Nucleated RBC: 0 /100 WBC (ref 0.0–0.0)
Platelets: 574 10*3/uL — ABNORMAL HIGH (ref 142–346)
RBC: 4.18 10*6/uL — ABNORMAL LOW (ref 4.20–5.90)
RDW: 15 % (ref 11–15)
WBC: 11.82 10*3/uL — ABNORMAL HIGH (ref 3.10–9.50)

## 2019-03-07 LAB — COMPREHENSIVE METABOLIC PANEL
ALT: 24 U/L (ref 0–55)
AST (SGOT): 19 U/L (ref 5–34)
Albumin/Globulin Ratio: 0.8 — ABNORMAL LOW (ref 0.9–2.2)
Albumin: 2.7 g/dL — ABNORMAL LOW (ref 3.5–5.0)
Alkaline Phosphatase: 121 U/L — ABNORMAL HIGH (ref 38–106)
Anion Gap: 9 (ref 5.0–15.0)
BUN: 16 mg/dL (ref 9–28)
Bilirubin, Total: 0.5 mg/dL (ref 0.2–1.2)
CO2: 22 mEq/L (ref 22–29)
Calcium: 9 mg/dL (ref 8.5–10.5)
Chloride: 102 mEq/L (ref 100–111)
Creatinine: 0.8 mg/dL (ref 0.7–1.3)
Globulin: 3.2 g/dL (ref 2.0–3.6)
Glucose: 153 mg/dL — ABNORMAL HIGH (ref 70–100)
Potassium: 4.8 mEq/L (ref 3.5–5.1)
Protein, Total: 5.9 g/dL — ABNORMAL LOW (ref 6.0–8.3)
Sodium: 133 mEq/L — ABNORMAL LOW (ref 136–145)

## 2019-03-07 LAB — MRSA CULTURE
Culture MRSA Surveillance: NEGATIVE
Culture MRSA Surveillance: NEGATIVE

## 2019-03-07 LAB — GFR: EGFR: >60

## 2019-03-07 NOTE — Rehab Progress Note ([facility]) (Signed)
Jonathan [name]  MRN: [id number]  Account: [id number]  Session Start: 03/07/2019 12:00:00 AM  Session Stop: 03/07/2019 12:00:00 AM    Total Treatment Minutes:  Minutes    Rehabilitation Nursing  Inpatient Rehabilitation Shift Assessment    Rehab Diagnosis: TBI  Demographics:            Age: 45Y            Gender: Male  Primary Language: English    Date of Onset:  02/22/19  Date of Admission: 03/06/2019 12:57:06 PM    Rehabilitation Precautions Restrictions:   Bilateral lower extremities: WBAT, Risk for falls. Seizure precaution, skin  integrity    Patient Report: "Hello, I'm ok"  Patient/Caregiver Goals:  To go back to my normal state, doing my own things    Wounds/Incisions:       Laceration to back of head, healing.    Medication Review: No clinically significant medication issues identified this  shift.    Bowel and Bladder Output:                       Bladder (# only)             Bowel (# only)  Number of Episodes  Continent            0                            0  Incontinent          0                            0    Education Provided:    Education Provided: Pain management. Pain scale. Medication options. Side  effects. Safety issues and interventions. Fall protocol. Supervision  requirements. Activities of daily living. Medication. Name and dosage.  Administration. Purpose. Side Effects.       Audience: Patient.       Mode: Explanation.       Response: Verbalized understanding.  Needs reinforcement.    Long Term Goals: 1. Patient will  be able to participate in therapy with pain  level 2/10 prior to medicated for pain 100% by d/c   2. Patient will recognize limitations and call for assiaatance before  attempting mobility 100% of the time in order to prevent falls at home   3. Systolic blood pressure will be at or below 150 via medications, diet,and  improved activity level 100% by d/c  Two weeks  Short Term Goals: 1. Patient will  be able to participate in therapy with pain  level 2/10 prior to medicated for  pain 50% by d/c   2. Patient will recognize limitations and call for assiaatance before  attempting mobility 50% of the time in order to prevent falls at home   3. Systolic blood pressure will be at or below 150 via medications, diet,and  improved activity level 50% by d/c  One week    PROGRESS TOWARD GOALS: Pain management.  Interventions: Provide health teachings on pain monitoring using numeric scale,  Use of clinical indicators to monitor pain level, Explain medications ordered  for pain management, Evaluate, assess outcomes, and/or effectiveness of pain  management    Response to intervention(s): Patient report less pain using the pain scale after  taking pain medication    Ongoing Education/Training: Encourage patient to ask for PRN  pain medication  when needed     Risk of falls/injuries.  Intervention(s): Use of chair and/or bed alarms, Make sure call light, personal  belongings within the patient?s reach, Recognize, assist, and anticipate basic  needs, Establish and/or assist with regular toileting program, Frequent safety  check, rounding, Use of non-skid footwear, Maintain adequate lighting, Encourage  and/assist with safe use of assistive devices during ambulation    Response to intervention(s): Patient did not have any fall today and ask for  assistance when needed but is impulsive at times.    Ongoing Education/Training: Encourage and reinforce safety precautions    PLAN:  Rehab Nursing Specific Interventions:  Continue with the current Nursing Plan of Care.    TEAM CARE PLAN  Identified problems from team documentation:  Problem: Impaired Cardiac Function  Cardiac: Primary Team Goal: Systolic blood pressure will be at or below 150 via  medications, diet,and improved activity level 100% by d/c/    Problem: Impaired Pain Management  Pain Mgmt: Primary Team Goal: Patient will  be able to participate in therapy  with pain level 2/10 prior to medicated for pain 100% by d/c/    Problem: Safety Risk and  Restraint  Safety: Primary Team Goal: Patient will recognize limitations and call for  assiaatance before attempting mobility 100% of the time in order to prevent  falls at home/    Please review Integrated Patient View Care Plan Flowsheet for Team identified  Problems, Interventions, and Goals.    Signed by: Elliot Cousin, RN 03/07/2019 11:55:00 AM

## 2019-03-07 NOTE — Rehab Progress Note ([facility]) (Signed)
Jonathan [name]  MRN: [id number]  Account: [id number]  Session Start: 03/07/2019 12:00:00 AM  Session Stop: 03/07/2019 12:00:00 AM    Total Treatment Minutes:  Minutes    Nutrition  Inpatient Rehabilitation Follow Up    Rehab Diagnosis: TBI  Demographics:            Age: 51Y            Gender: Male    Rehabilitation Precautions/Restrictions:   Bilateral lower extremities: WBAT, Risk for falls. Seizure precaution, skin  integrity  Medications and Allergies: Significant rehabilitation considerations:   NKA    SUBJECTIVE  Patient Reports: I'm eating this soup, I can't eat some things because I'm  missing teeth. Daughter said she had Magic Cup for breakfast.  Patient/Caregiver Goals:  To go back to my normal state, doing my own things    Current Medical Nutrition Therapy: Diet: Regular.   Dysphagia Mechanically Altered   diet with nectar thick liquids. Oral Supplements: Magic Cup BID    % of Meals Consumed:  0-100 %  Labs Noted: 8/5 glu 101, BUN 23, Cl 99  Current Problems/GI Symptoms: No Current GI Problems Noted.    Assessment of Nutrition Status:  Nutritional Risk Assessment:  Nutritional risk is present.  Nutrition Diagnosis:   NC-3.1 Underweight.   continues   NC-1.1 Swallowing Difficulty.   improving  Current Nutrition Intake: Adequate.  Current Nutrition Status: Adequate.    Interventions:   Assessment Completed.   Nutritional Adequacy Assessed:   Tolerance to Current Nutrition Therapy Assessed:    Education Provided:  No education provided this session.    ASSESSMENT    Assessment of Nutritional Status: Noted pt's wt is more than usual here. Labs  generally WDL. Family bringing pt favorite foods from home. Rec: continue same  nutrition plan with Magic Cup BID as pt is enjoying those.    Progress Towards Goals: Pt eating 25-50% of meals, and 100% of Magic Cup most  days, needs met adequately. Diet progressed to mech alt dysphagia diet with  nectar thick liquids.    PLAN  Recommendations for Follow-up: will continue  to monitor po intake, wt changes  and labs within 14 days.    Care Plan  Identified problems from team documentation:  Problem: Impaired Cardiac Function  Cardiac: Primary Team Goal: Systolic blood pressure will be at or below 150 via  medications, diet,and improved activity level 100% by d/c/    Problem: Impaired Pain Management  Pain Mgmt: Primary Team Goal: Patient will  be able to participate in therapy  with pain level 2/10 prior to medicated for pain 100% by d/c/    Problem: Impaired Psychosocial Skills/Behavior  PsychoSocial: Primary Team Goal: Patient will regularly employ adaptive coping  skills for adjusting to medical illness, reduced function, and rehab  hospitalization via maintaining adherence with his rehab plan of care, and  expressing positive expectations for increasing his overall future functional  independence./    Problem: Safety Risk and Restraint  Safety: Primary Team Goal: Patient will recognize limitations and call for  assiaatance before attempting mobility 100% of the time in order to prevent  falls at home/    Add/Update Problems from this Treatment:  No updates at this time.    Please review Integrated Patient View Care Plan Flowsheet for Team identified  Problems, Interventions, and Goals.    Signed by: Leeanne Deed, RD 03/07/2019 2:57:00 PM
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
## 2019-03-07 NOTE — Rehab Progress Note ([facility]) (Signed)
NAMEJOWELL BOSSI  MRN: [id number]  Account: [id number]  Session Start: 03/07/2019 12:00:00 AM  Session Stop: 03/07/2019 12:00:00 AM    Total Treatment Minutes:  Minutes    Rehabilitation Nursing  Inpatient Rehabilitation Shift Assessment    Rehab Diagnosis: TBI  Demographics:            Age: 30Y            Gender: Male  Primary Language: English    Date of Onset:  02/22/19  Date of Admission: 03/06/2019 12:57:06 PM    Rehabilitation Precautions Restrictions:   Bilateral lower extremities: WBAT, Risk for falls. Seizure precaution, skin  integrity    Patient Report: " I want to go to bathroom by my self i do not need help, i want  to be independent"  Patient/Caregiver Goals:  To go back to my normal state, doing my own things    Wounds/Incisions:   laceration  noted at the back of the head, Abrasion right arm and right upper  back.    Medication Review: No clinically significant medication issues identified this  shift.    Bowel and Bladder Output:                       Bladder (# only)             Bowel (# only)  Number of Episodes  Continent            0                            0  Incontinent          0                            0    Education Provided:    Education Provided: Pain management. Pain scale. Medication options. Side  effects. Safety issues and interventions. Fall protocol. Impulsivity. Fall  protocol.       Audience: Patient.       Mode: Explanation.       Response: Verbalized understanding.    Long Term Goals: 1. Patient will  be able to participate in therapy with pain  level 2/10 prior to medicated for pain 100% by d/c   2. Patient will recognize limitations and call for assiaatance before  attempting mobility 100% of the time in order to prevent falls at home   3. Systolic blood pressure will be at or below 150 via medications, diet,and  improved activity level 100% by d/c  Two weeks  Short Term Goals: 1. Patient will  be able to participate in therapy with pain  level 2/10 prior to medicated for pain  50% by d/c   2. Patient will recognize limitations and call for assiaatance before  attempting mobility 50% of the time in order to prevent falls at home   3. Systolic blood pressure will be at or below 150 via medications, diet,and  improved activity level 50% by d/c  One week    PROGRESS TOWARD GOALS: Pain management.  Interventions: Provide health teachings on pain monitoring using numeric scale,  Use of clinical indicators to monitor pain level, Explain medications ordered  for pain management, Determine etiology of pain, Evaluate, assess outcomes,  and/or effectiveness of pain management, Assist patient to identify other/or  non- pharmacologic measures to alleviate pain (e.g., positioning, ROM exercises,  splinting, slow stretching exercises), Reinforce the use of alternative therapy,  treatments and/or other pain modalities recommended by MD/PT/OT.    Response to intervention(s): patient verbilzed less pain after pain medication.    Ongoing Education/Training: Continue with pain assessment and management .     Risk of falls/injuries.  Intervention(s): Teach patient/ family to evaluate situation and environment for  hazards, Medication review, Fall risk assessment using EBP tool, Use of chair  and/or bed alarms, Make sure call light, personal belongings within the  patient?s reach, Recognize, assist, and anticipate basic needs, Establish and/or  assist with regular toileting program, Provide distraction, direct attention to  other activities, Frequent safety check, rounding, Use of non-skid footwear,  Maintain adequate lighting    Response to intervention(s): patient impulsive, does not call for assistance.    Ongoing Education/Training: patient  highlfall risk. Continue with fall  precautions and assist patient with needs.    PLAN:  Rehab Nursing Specific Interventions:  Continue with the current Nursing Plan of Care.    TEAM CARE PLAN  Identified problems from team documentation:  Problem: Impaired Cardiac  Function  Cardiac: Primary Team Goal: Systolic blood pressure will be at or below 150 via  medications, diet,and improved activity level 100% by d/c/    Problem: Impaired Pain Management  Pain Mgmt: Primary Team Goal: Patient will  be able to participate in therapy  with pain level 2/10 prior to medicated for pain 100% by d/c/    Problem: Safety Risk and Restraint  Safety: Primary Team Goal: Patient will recognize limitations and call for  assiaatance before attempting mobility 100% of the time in order to prevent  falls at home/    Please review Integrated Patient View Care Plan Flowsheet for Team identified  Problems, Interventions, and Goals.    Signed by: Marshall Cork, RN 03/07/2019 4:24:00 AM
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
## 2019-03-07 NOTE — Progress Notes (Signed)
NAME: Jonathan [name]  MRN: [id number]  Account: [id number]  Session Start: 03/07/2019 12:00:00 AM  Session Stop: 03/07/2019 12:00:00 AM    Total Treatment Minutes:  Minutes    Physical Medicine and Rehabilitation  Initial Individualized Interdisciplinary Plan Of Care    Rehab Diagnosis: TBI  Demographics:            Age: 60Y            Gender: Male    Plan Of Care  Anticipated Discharge Date/Estimated Length of Stay: 10 days  Anticipated Discharge Destination: Community discharge with assistance  Fall Risk Level: Yellow (Medium)  Medical Necessity Expected Level Rationale: Monitor pain, cognitive dysfunction,  nutrition and hydration will be optimized  Intensity and Duration: an average of 3 hours/5 days per week  Medical Supervision and 24 Hour Rehab Nursing: x  Physical Therapy: x  PT Intensity/Duration: 60-120 min, 5-6 days per week  Occupational Therapy: x  OT Intensity/Duration: 60-120 min daily, 5-6 days per week  Speech and Language Therapy: x  SLP Intensity/Duration: 60-137min daily, 5-6 days per week  Therapeutic Recreation: x  Psychology: x    The following is a list of patient problems that have been identified by the  interdisciplinary team:    Problem: Impaired Cardiac Function  Cardiac: Primary Team Goal/Status: Systolic blood pressure will be at or below  150 via medications, diet,and improved activity level 100% by d/c /    Problem: Impaired Cognition  Team Identified Barrier to Discharge: Yes  Interventions:  Decrease environmental stimuli: Active  Safety awareness training: Active  Compensatory strategies: Active  Cognition: Primary Team Goal/Status: Pt will utilize internal/external  compensatory memory strategies to support new learning of precautions, medical  management, as well as brain injury education w/ only min-mod verbal cues from  staff/caregiver. / Active    Problem: Impaired Mobility  Team Identified Barrier to Discharge: Yes  Interventions:  Transfer training: Active  Gait training:  Active  Wheelchair propulsion and management: Active  Mobility: Primary Team Goal/Status: Pt will be modI for bed mobility, and  ambulation with LRAD and SPV for stairs to return home safely. / Active    Problem: Impaired Pain Management  Pain Mgmt: Primary Team Goal/Status: Patient will  be able to participate in  therapy with pain level 2/10 prior to medicated for pain 100% by d/c /    Problem: Impaired Psychosocial Skills/Behavior  PsychoSocial: Primary Team Goal/Status: Patient will regularly employ adaptive  coping skills for adjusting to medical illness, reduced function, and rehab  hospitalization via maintaining adherence with his rehab plan of care, and  expressing positive expectations for increasing his overall future functional  independence. /    Problem: Safety Risk and Restraint  Safety: Primary Team Goal/Status: Patient will recognize limitations and call  for assiaatance before attempting mobility 100% of the time in order to prevent  falls at home /    Comments: Monitor pain, cognitive function, nutritition and hydration will be  optimized.    Signed by: Verdis Frederickson, MD 03/07/2019 10:29:00 PM    Physician CoSigned By: Verdis Frederickson 03/07/2019 22:27:04

## 2019-03-08 ENCOUNTER — Telehealth (INDEPENDENT_AMBULATORY_CARE_PROVIDER_SITE_OTHER): Payer: Self-pay

## 2019-03-08 LAB — BASIC METABOLIC PANEL
Anion Gap: 10 (ref 5.0–15.0)
BUN: 17 mg/dL (ref 9–28)
CO2: 24 mEq/L (ref 22–29)
Calcium: 9.4 mg/dL (ref 8.5–10.5)
Chloride: 100 mEq/L (ref 100–111)
Creatinine: 0.9 mg/dL (ref 0.7–1.3)
Glucose: 139 mg/dL — ABNORMAL HIGH (ref 70–100)
Potassium: 4.6 mEq/L (ref 3.5–5.1)
Sodium: 134 mEq/L — ABNORMAL LOW (ref 136–145)

## 2019-03-08 LAB — CBC
Absolute NRBC: 0 10*3/uL (ref 0.00–0.00)
Hematocrit: 36.8 % — ABNORMAL LOW (ref 37.6–49.6)
Hgb: 12.4 g/dL — ABNORMAL LOW (ref 12.5–17.1)
MCH: 30 pg (ref 25.1–33.5)
MCHC: 33.7 g/dL (ref 31.5–35.8)
MCV: 89.1 fL (ref 78.0–96.0)
MPV: 9 fL (ref 8.9–12.5)
Nucleated RBC: 0 /100 WBC (ref 0.0–0.0)
Platelets: 585 10*3/uL — ABNORMAL HIGH (ref 142–346)
RBC: 4.13 10*6/uL — ABNORMAL LOW (ref 4.20–5.90)
RDW: 15 % (ref 11–15)
WBC: 11.54 10*3/uL — ABNORMAL HIGH (ref 3.10–9.50)

## 2019-03-08 LAB — HEMOGLOBIN A1C
Average Estimated Glucose: 128.4 mg/dL
Hemoglobin A1C: 6.1 % — ABNORMAL HIGH (ref 4.6–5.9)

## 2019-03-08 LAB — PT/INR
PT INR: 1 (ref 0.9–1.1)
PT: 13.2 s (ref 12.6–15.0)

## 2019-03-08 LAB — GFR: EGFR: >60

## 2019-03-08 MED ORDER — LOSARTAN POTASSIUM 25 MG PO TABS
100.0000 mg | ORAL_TABLET | Freq: Every day | ORAL | Status: DC
Start: 2019-03-09 — End: 2019-03-09
  Administered 2019-03-09: 08:00:00 100 mg via ORAL
  Filled 2019-03-08: qty 4

## 2019-03-08 MED ORDER — ASPIRIN 81 MG PO CHEW
81.00 mg | CHEWABLE_TABLET | Freq: Every day | ORAL | Status: DC
Start: 2019-03-08 — End: 2019-03-08
  Administered 2019-03-08: 13:00:00 81 mg via ORAL
  Filled 2019-03-08: qty 1
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
## 2019-03-08 NOTE — Telephone Encounter (Signed)
Put in a [employer] call today- so far no response  Put them on the schedule next Tuesday ( [employer] or Zoom)

## 2019-03-08 NOTE — Rehab Progress Note ([facility]) (Signed)
Jonathan [name]  MRN: [id number]  Account: [id number]  Session Start: 03/08/2019 12:00:00 AM  Session Stop: 03/08/2019 12:00:00 AM    Total Treatment Minutes:  Minutes    Rehabilitation Nursing  Inpatient Rehabilitation Shift Assessment    Rehab Diagnosis: TBI  Demographics:            Age: 80Y            Gender: Male  Primary Language: English    Date of Onset:  02/22/19  Date of Admission: 03/06/2019 12:57:06 PM    Rehabilitation Precautions Restrictions:   Bilateral lower extremities: WBAT, Risk for falls. Seizure precaution, skin  integrity    Patient Report: "Hi, welcome back"  Patient/Caregiver Goals:  To go back to my normal state, doing my own things    Wounds/Incisions:       Laceration to back of head,healing.    Medication Review: No clinically significant medication issues identified this  shift.    Bowel and Bladder Output:                       Bladder (# only)             Bowel (# only)  Number of Episodes  Continent            2                            0  Incontinent          0                            0    Education Provided:    Education Provided: Pain management. Pain scale. Medication options. Side  effects. Safety issues and interventions. Fall protocol. Supervision  requirements. Activities of daily living. Medication. Name and dosage.  Administration. Purpose. Side Effects.       Audience: Patient.       Mode: Explanation.       Response: Verbalized understanding.    Long Term Goals: 1. Patient will  be able to participate in therapy with pain  level 2/10 prior to medicated for pain 100% by d/c   2. Patient will recognize limitations and call for assiaatance before  attempting mobility 100% of the time in order to prevent falls at home   3. Systolic blood pressure will be at or below 150 via medications, diet,and  improved activity level 100% by d/c  Two weeks  Short Term Goals: 1. Patient will  be able to participate in therapy with pain  level 2/10 prior to medicated for pain 50% by d/c   2.  Patient will recognize limitations and call for assiaatance before  attempting mobility 50% of the time in order to prevent falls at home   3. Systolic blood pressure will be at or below 150 via medications, diet,and  improved activity level 50% by d/c  One week    PROGRESS TOWARD GOALS: Pain management.  Interventions: Provide health teachings on pain monitoring using numeric scale,  Use of clinical indicators to monitor pain level, Explain medications ordered  for pain management, Evaluate, assess outcomes, and/or effectiveness of pain  management    Response to intervention(s): Patient report less pain today using the pain scale  after taking pain medication    Ongoing Education/Training: Encourage patient to ask for PRN pain medication  when needed     Risk of falls/injuries.  Intervention(s): Use of chair and/or bed alarms, Make sure call light, personal  belongings within the patient?s reach, Recognize, assist, and anticipate basic  needs, Establish and/or assist with regular toileting program, Frequent safety  check, rounding, Use of non-skid footwear, Maintain adequate lighting, Encourage  and/assist with safe use of assistive devices during ambulation    Response to intervention(s): Patient did not have any fall today, bed and  wheelchair alarm used.    Ongoing Education/Training: Encourage and reinforce safety precautions.    PLAN:  Rehab Nursing Specific Interventions:  Continue with the current Nursing Plan of Care.    TEAM CARE PLAN  Identified problems from team documentation:  Problem: Impaired Cardiac Function  Cardiac: Primary Team Goal: Systolic blood pressure will be at or below 150 via  medications, diet,and improved activity level 100% by d/c/    Problem: Impaired Cognition  Cognition: Primary Team Goal: Pt will utilize internal/external compensatory  memory strategies to support new learning of precautions, medical management, as  well as brain injury education w/ only min-mod verbal cues  from  staff/caregiver./Active    Problem: Impaired Mobility  Mobility: Primary Team Goal: Pt will be modI for bed mobility, and ambulation  with LRAD and SPV for stairs to return home safely./Active    Problem: Impaired Pain Management  Pain Mgmt: Primary Team Goal: Patient will  be able to participate in therapy  with pain level 2/10 prior to medicated for pain 100% by d/c/    Problem: Impaired Psychosocial Skills/Behavior  PsychoSocial: Primary Team Goal: Patient will regularly employ adaptive coping  skills for adjusting to medical illness, reduced function, and rehab  hospitalization via maintaining adherence with his rehab plan of care, and  expressing positive expectations for increasing his overall future functional  independence./    Problem: Impaired Self-care Mgmt/ADL/IADL  Self Care: Primary Team Goal: Pt's caregiver will provide recommended  environment, and supervision in order for pt to complete self are tasks at mod  I/supervision level and return home safely./Active    Problem: Safety Risk and Restraint  Safety: Primary Team Goal: Patient will recognize limitations and call for  assiaatance before attempting mobility 100% of the time in order to prevent  falls at home/    Please review Integrated Patient View Care Plan Flowsheet for Team identified  Problems, Interventions, and Goals.    Signed by: Elliot Cousin, RN 03/08/2019 1:21:00 PM

## 2019-03-08 NOTE — Rehab Progress Note ([facility]) (Signed)
Received patient today in stable condition. Disoriented to situation. Participates in therapy. Patient remains impulsive and refuses to adhere to safety precautions. An AVS monitor now in place and bed alarm maintained.Close observation continues.
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
## 2019-03-08 NOTE — Telephone Encounter (Signed)
Called pt wife to offer telemedicine visit per Dr. Thelma Barge.     Pt wife scheduled for zoom visit with Dr. Thelma Barge on 03/12/19 @9am  to discuss concerns regarding pt.
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
## 2019-03-08 NOTE — Rehab Progress Note ([facility]) (Signed)
Jonathan [name]  MRN: [id number]  Account: [id number]  Session Start: 03/08/2019 10:00:00 AM  Session Stop: 03/08/2019 11:00:00 AM    Total Treatment Minutes: 60.00 Minutes    Speech Language Pathology  Inpatient Rehabilitation Treatment Note    Rehab Diagnosis: TBI  Demographics:            Age: 73Y            Gender: Male  Rehabilitation Precautions/Restrictions:   Bilateral lower extremities: WBAT, Risk for falls. Seizure precaution, skin  integrity    Interventions:       Speech Treatment: Treatment focusing on recall, goal setting, and  developing insight into limitations.       Speech Treatment: Appreciate PT and RN handoffs noting concerns of poor  adherance to safety precautions c/b frequently setting off bed/chair alarms.  Spoke to pt re: severity of brain injury and typical length of recovery process.  Discouraged pt from neglecting call bell for everyday needs given importance of  fall prevention as it relates to his brain health. Introduced him to education  binder and he completed the "Getting Started" worksheet w/o A (sustaining  attention well w/o interruptions). Reiterated results of formal cognitive  testing (HVLT-R) as pt somewhat minimizing severity ("it's really because I  didn't think the words were meaningful and I couldn't write them down").  Developed personally-relevant goals for rehab including recalling timeline of  medical events and increasing independence w/ his medication management  (retaining information about med schedule). Engaged in developing timeline for  hospital course using Word document. Evidence of emerging self-monitoring w/  initiations to address misspellings. SLP facilitating his transcription of key  details including injuries sustained, procedures performed, dates, names of  facilities, etc. Written aide created and inserted into education binder along  w/ breakdown of discipline descriptions. Navigated back to room w/ only  incidental verbal cues for recalling  directions.  Patient was returned to or left in bed. Handoff to nurse completed.  Bed alarm in place and activated.  Oriented to call bell and placed within reach.  Personal items within reach.    ASSESSMENT  Pt will continue to require reinforcement of safety precautions to increase  appreciation for overall fall risk. 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 Motivated to target memory storage related to his health  condition and will benefit from developing personalized aides for medication  schedule.    3 Hour Rule Minutes: 60 minutes of SLP treatment this session count towards  intensity and duration of therapy requirement. Patient was seen for the full  scheduled time of SLP treatment this session.  Therapy Mode Minutes: Individual: 60 minutes.    Signed by: Trenda Moots, M.A., CCC/SLP 03/08/2019 11:00:00 AM

## 2019-03-08 NOTE — Rehab Progress Note ([facility]) (Signed)
Jonathan [name]  MRN: [id number]  Account: [id number]  Session Start: 03/08/2019 8:00:00 AM  Session Stop: 03/08/2019 9:00:00 AM    Total Treatment Minutes: 60.00 Minutes    Physical Therapy  Inpatient Rehabilitation Treatment Note    Rehab Diagnosis: TBI  Demographics:            Age: 28Y            Gender: Male  Rehabilitation Precautions/Restrictions:   Bilateral lower extremities: WBAT, Risk for falls. Seizure precaution, skin  integrity    OBJECTIVE  Vital Signs:                       Before Activity              After Activity  Vitals  BP Systolic          152                          131  BP Diastolic         92                           74  Pulse                65                           60    Interventions:       Therapeutic Activities:  Pt with poor safety insight throughout session. Pt  very verbose and possibly labile/slightly manic. Pt ambulated without AD to/from  therapy gym with CGA and SBA. Pt performed 15:40 on nustep gear 5, 985 steps, 74  calories. Pt then performed alternating toe taps to mat in lowest position with  several LOB self recovered. PT provided education on brain injury and deficits  including poor insight and judgement. Pt very insistent on decreasing fall  rating. PT explained possible repercussions of 2nd blow to the head. PT  explained the fall color scheme. PT explained that the fall assessment is for  the patient's safety and well being. Pt lightly perseverative on hospital's  financial motive to prevent falls and his willingness to absolve hospital of  blame if he falls and injures himself.  Patient was returned to or left in bed. Handoff to nurse completed.  Bed alarm in place and activated.  Oriented to call bell and placed within reach.  Personal items within reach.  Assistive devices positioned out of reach.  Bed placed in lowest position.    ASSESSMENT  Mr Blau dynamic balance is affected by mild/moderate ataxia. His safety  awreness is also very poor. He will benefit  from continued skilled therapy to  improve safety and balance and ataxia.    3 Hour Rule Minutes: Branch  Therapy Mode Minutes: Branch    Signed by: Aileen Fass, PT, DPT 03/08/2019 9:00:00 AM

## 2019-03-08 NOTE — Rehab Progress Note ([facility]) (Signed)
NAMEJERON [name]  MRN: [id number]  Account: [id number]  Session Start: 03/08/2019 11:00:00 AM  Session Stop: 03/08/2019 12:00:00 PM    Total Treatment Minutes: 60.00 Minutes    Occupational Therapy  Inpatient Rehabilitation Treatment Note    Rehab Diagnosis: TBI  Demographics:            Age: 56Y            Gender: Male  Rehabilitation Precautions/Restrictions:   Bilateral lower extremities: WBAT, Risk for falls. Seizure precaution, skin  integrity    Vital Signs:                       Before Activity              After Activity  Vitals  Position/Activity    seated                       -  BP Systolic          146                          -  BP Diastolic         87                           -  Pulse                60                           -    Interventions:       Therapeutic Activities:  OT provided rationale for OT intervention and  assisted pt to identify problems areas. With guidance he identified walking, rib  pain, and vision.  -functional mobility with RW and cues for consistence of gait speed, walker  management, route finding task using signage.  -thoracic stretches to promote mobilization of ribs-complete varied thoracic  rotation and lengthening stretches, arm circles, wind mills,  -standing balance with alternate toe taps, SBA for postural adjustments  -reviewed fall risk status with patient  Patient was left seated in chair in his/her room. Handoff to nurse completed.  Chair alarm in place and activated.  Oriented to call bell and placed within reach.  Personal items within reach.    ASSESSMENT  Pt was verbose t/o session and required redirection to attend to directions. He  verbalized observations ( brightness, color changes) consistent with vision  changes, but denied changes overtly. At times he occluded one eye.  OT will  continue to asses vision and impact on performance.  He will benefit from  ongoing education o n impact of head injury to improve insight and compliance  with safety measures.    3  Hour Rule Minutes: 60 minutes of OT treatment this session count towards  intensity and duration of therapy requirement. Patient was seen for the full  scheduled time of OT treatment this session.  Therapy Mode Minutes: Individual: 60 minutes.    Signed by: Hermina Staggers, OTR/L 03/08/2019 12:00:00 PM

## 2019-03-08 NOTE — Rehab Progress Note ([facility]) (Signed)
Jonathan [name]  MRN: [id number]  Account: [id number]  Session Start: 03/07/2019 12:00:00 AM  Session Stop: 03/07/2019 12:00:00 AM    Total Treatment Minutes:  Minutes    Rehabilitation Nursing  Inpatient Rehabilitation Shift Assessment    Rehab Diagnosis: TBI  Demographics:            Age: 51Y            Gender: Male  Primary Language: English    Date of Onset:  02/22/19  Date of Admission: 03/06/2019 12:57:06 PM    Rehabilitation Precautions Restrictions:   Bilateral lower extremities: WBAT, Risk for falls. Seizure precaution, skin  integrity    Patient Report: I am doing fine  Patient/Caregiver Goals:  To go back to my normal state, doing my own things    Wounds/Incisions:       Laceration to back of head, healing    Medication Review: No clinically significant medication issues identified this  shift.    Bowel and Bladder Output:                       Bladder (# only)             Bowel (# only)  Number of Episodes  Continent            2                            0  Incontinent          0                            0    Education Provided:    Education Provided: Fall prevention/balance training.       Audience: Patient.       Mode: Explanation.  Demonstration.       Response: Needs reinforcement.    Long Term Goals: 1. Patient will  be able to participate in therapy with pain  level 2/10 prior to medicated for pain 100% by d/c   2. Patient will recognize limitations and call for assiaatance before  attempting mobility 100% of the time in order to prevent falls at home   3. Systolic blood pressure will be at or below 150 via medications, diet,and  improved activity level 100% by d/c  Two weeks  Short Term Goals: 1. Patient will  be able to participate in therapy with pain  level 2/10 prior to medicated for pain 50% by d/c   2. Patient will recognize limitations and call for assiaatance before  attempting mobility 50% of the time in order to prevent falls at home   3. Systolic blood pressure will be at or below 150 via  medications, diet,and  improved activity level 50% by d/c  One week    PROGRESS TOWARD GOALS: Risk of falls/injuries.  Intervention(s): Teach patient/ family to evaluate situation and environment for  hazards, Medication review, Fall risk assessment using EBP tool, Use of chair  and/or bed alarms, Make sure call light, personal belongings within the  patient?s reach, Recognize, assist, and anticipate basic needs, Establish and/or  assist with regular toileting program, Provide distraction, direct attention to  other activities, Frequent safety check, rounding, Use of non-skid footwear,  Provide floor mat, Maintain adequate lighting, Reinforce safety instruction for  transfer techniques, gait training and use of mobility devices recommended by  PT/OT, Encourage and/assist with safe use of assistive devices during  ambulation, Allow patient and/family to identify safety problems and  environmental hazards at home and determine actions to prevent accidents/  injuries, Allow patient and/or family to demo learned strategies to prevent  falls/injuries    Response to intervention(s): Verbalized understanding    Ongoing Education/Training: Regular use of call light    PLAN:  Rehab Nursing Specific Interventions:  Continue with the current Nursing Plan of Care.    TEAM CARE PLAN  Identified problems from team documentation:  Problem: Impaired Cardiac Function  Cardiac: Primary Team Goal: Systolic blood pressure will be at or below 150 via  medications, diet,and improved activity level 100% by d/c/    Problem: Impaired Cognition  Cognition: Primary Team Goal: Pt will utilize internal/external compensatory  memory strategies to support new learning of precautions, medical management, as  well as brain injury education w/ only min-mod verbal cues from  staff/caregiver./Active    Problem: Impaired Mobility  Mobility: Primary Team Goal: Pt will be modI for bed mobility, and ambulation  with LRAD and SPV for stairs to return home  safely./Active    Problem: Impaired Pain Management  Pain Mgmt: Primary Team Goal: Patient will  be able to participate in therapy  with pain level 2/10 prior to medicated for pain 100% by d/c/    Problem: Impaired Psychosocial Skills/Behavior  PsychoSocial: Primary Team Goal: Patient will regularly employ adaptive coping  skills for adjusting to medical illness, reduced function, and rehab  hospitalization via maintaining adherence with his rehab plan of care, and  expressing positive expectations for increasing his overall future functional  independence./    Problem: Safety Risk and Restraint  Safety: Primary Team Goal: Patient will recognize limitations and call for  assiaatance before attempting mobility 100% of the time in order to prevent  falls at home/    Please review Integrated Patient View Care Plan Flowsheet for Team identified  Problems, Interventions, and Goals.    Signed by: Barrett Shell, RN 03/07/2019 11:57:00 PM

## 2019-03-09 ENCOUNTER — Encounter (INDEPENDENT_AMBULATORY_CARE_PROVIDER_SITE_OTHER): Payer: Self-pay

## 2019-03-09 LAB — CBC
Absolute NRBC: 0 10*3/uL (ref 0.00–0.00)
Hematocrit: 38.1 % (ref 37.6–49.6)
Hgb: 12.7 g/dL (ref 12.5–17.1)
MCH: 29.7 pg (ref 25.1–33.5)
MCHC: 33.3 g/dL (ref 31.5–35.8)
MCV: 89 fL (ref 78.0–96.0)
MPV: 9 fL (ref 8.9–12.5)
Nucleated RBC: 0 /100 WBC (ref 0.0–0.0)
Platelets: 627 10*3/uL — ABNORMAL HIGH (ref 142–346)
RBC: 4.28 10*6/uL (ref 4.20–5.90)
RDW: 15 % (ref 11–15)
WBC: 10.83 10*3/uL — ABNORMAL HIGH (ref 3.10–9.50)

## 2019-03-09 LAB — PT/INR
PT INR: 1 (ref 0.9–1.1)
PT: 13 s (ref 12.6–15.0)

## 2019-03-09 LAB — BASIC METABOLIC PANEL
Anion Gap: 11 (ref 5.0–15.0)
BUN: 15 mg/dL (ref 9–28)
CO2: 24 mEq/L (ref 22–29)
Calcium: 9.7 mg/dL (ref 8.5–10.5)
Chloride: 100 mEq/L (ref 100–111)
Creatinine: 0.8 mg/dL (ref 0.7–1.3)
Glucose: 106 mg/dL — ABNORMAL HIGH (ref 70–100)
Potassium: 5 mEq/L (ref 3.5–5.1)
Sodium: 135 mEq/L — ABNORMAL LOW (ref 136–145)

## 2019-03-09 LAB — GFR: EGFR: >60

## 2019-03-09 MED ORDER — WARFARIN SODIUM 5 MG PO TABS
5.0000 mg | ORAL_TABLET | Freq: Every day | ORAL | Status: DC
Start: 2019-03-09 — End: 2019-03-10
  Administered 2019-03-09: 18:00:00 5 mg via ORAL
  Filled 2019-03-09: qty 1

## 2019-03-09 MED ORDER — LOSARTAN POTASSIUM 25 MG PO TABS
50.0000 mg | ORAL_TABLET | Freq: Every day | ORAL | Status: DC
Start: 2019-03-10 — End: 2019-03-20
  Administered 2019-03-10 – 2019-03-20 (×11): 50 mg via ORAL
  Filled 2019-03-09 (×11): qty 2

## 2019-03-09 MED ORDER — LACOSAMIDE 50 MG PO TABS
100.0000 mg | ORAL_TABLET | Freq: Two times a day (BID) | ORAL | Status: DC
Start: 2019-03-09 — End: 2019-03-10
  Administered 2019-03-09 – 2019-03-10 (×2): 100 mg via ORAL
  Filled 2019-03-09 (×2): qty 2

## 2019-03-09 NOTE — Rehab Progress Note ([facility]) (Signed)
[name]  MRN: [id number]  Account: [id number]  Session Start: 03/09/2019 12:00:00 AM  Session Stop: 03/09/2019 12:00:00 AM    Total Treatment Minutes:  Minutes    Rehabilitation Nursing  Inpatient Rehabilitation Shift Assessment    Rehab Diagnosis: TBI  Demographics:            Age: 62Y            Gender: Male  Primary Language: English    Date of Onset:  02/22/19  Date of Admission: 03/06/2019 12:57:06 PM    Rehabilitation Precautions Restrictions:   Bilateral lower extremities: WBAT, Risk for falls. Seizure precaution, skin  integrity    Patient Report: I'm doing fine, I need to know how to turn the alarm on the bed  off so i can go to the bathroom and move around without being squawked at.  Patient/Caregiver Goals:  To go back to my normal state, doing my own things    Wounds/Incisions: No wounds or incisions.    Medication Review: No clinically significant medication issues identified this  shift.    Bowel and Bladder Output:                       Bladder (# only)             Bowel (# only)  Number of Episodes  Continent            3                            1  Incontinent          0                            0    Education Provided:    Education Provided: Precautions. Medication.       Audience: Patient.       Mode: Explanation.       Response: Verbalized understanding.    Long Term Goals: 1. Patient will  be able to participate in therapy with pain  level 2/10 prior to medicated for pain 100% by d/c   2. Patient will recognize limitations and call for assiaatance before  attempting mobility 100% of the time in order to prevent falls at home   3. Systolic blood pressure will be at or below 150 via medications, diet,and  improved activity level 100% by d/c  Two weeks  Short Term Goals: 1. Patient will  be able to participate in therapy with pain  level 2/10 prior to medicated for pain 50% by d/c - Goal Not Met   2. Patient will recognize limitations and call for assiaatance before  attempting mobility 50%  of the time in order to prevent falls at home - Goal Not  Met   3. Systolic blood pressure will be at or below 150 via medications, diet,and  improved activity level 50% by d/c  One week    PROGRESS TOWARD GOALS: Medication management. Assess patient/caregiver  understanding of medication administration steps, Provide patient/caregiver with  updated medication administration record (M.A.R), Provide printed information  Response to Medication Management Interventions: we discussed the patient's  current medications, he verbalized understanding, needs reinforcement.  Focus of Ongoing Education and Training: reinforce teaching until the patient  can teach back 100 percent.    PLAN:  Rehab Nursing Specific Interventions:  Continue with the  current Nursing Plan of Care.    TEAM CARE PLAN  Identified problems from team documentation:  Problem: Impaired Cardiac Function  Cardiac: Primary Team Goal: Systolic blood pressure will be at or below 150 via  medications, diet,and improved activity level 100% by d/c/    Problem: Impaired Cognition  Cognition: Primary Team Goal: Pt will utilize internal/external compensatory  memory strategies to support new learning of precautions, medical management, as  well as brain injury education w/ only min-mod verbal cues from  staff/caregiver./Active    Problem: Impaired Mobility  Mobility: Primary Team Goal: Pt will be modI for bed mobility, and ambulation  with LRAD and SPV for stairs to return home safely./Active    Problem: Impaired Pain Management  Pain Mgmt: Primary Team Goal: Patient will  be able to participate in therapy  with pain level 2/10 prior to medicated for pain 100% by d/c/    Problem: Impaired Psychosocial Skills/Behavior  PsychoSocial: Primary Team Goal: Patient will regularly employ adaptive coping  skills for adjusting to medical illness, reduced function, and rehab  hospitalization via maintaining adherence with his rehab plan of care, and  expressing positive  expectations for increasing his overall future functional  independence./    Problem: Impaired Self-care Mgmt/ADL/IADL  Self Care: Primary Team Goal: Pt's caregiver will provide recommended  environment, and supervision in order for pt to complete self are tasks at mod  I/supervision level and return home safely./Active    Problem: Safety Risk and Restraint  Safety: Primary Team Goal: Patient will recognize limitations and call for  assiaatance before attempting mobility 100% of the time in order to prevent  falls at home/    Please review Integrated Patient View Care Plan Flowsheet for Team identified  Problems, Interventions, and Goals.    Signed by: Suanne Marker, RN 03/09/2019 1:44:00 PM

## 2019-03-09 NOTE — Rehab Progress Note ([facility]) (Signed)
NAMEORYN CASANOVA  MRN: [id number]  Account: [id number]  Session Start: 03/09/2019 2:00:00 PM  Session Stop: 03/09/2019 3:00:00 PM    Total Treatment Minutes: 60.00 Minutes    Physical Therapy  Inpatient Rehabilitation Treatment Note    Rehab Diagnosis: TBI  Demographics:            Age: 22Y            Gender: Male  Rehabilitation Precautions/Restrictions:   Bilateral lower extremities: WBAT, Risk for falls. Seizure precaution, skin  integrity    OBJECTIVE  Vital Signs:                       Before Activity              After Activity  Vitals  Time                 1407                         1452  Position/Activity    sitting                      sitting  BP Systolic          111                          93  BP Diastolic         68                           60  Pulse                60                           76    Interventions:       Therapeutic Activities:  Pt found seated at EOB with wife and pastor  present in room.  Therapy session focused on gait training.  Pt asking to go  outside.  He practiced gait training on level indoor surfaces ambulating 252ft  and 134ft without AD with SBA and 135ft pushing the w/c with SBA/S.  He  performed gait training on un/even outdoor surfaces going up/down ramps and  up/down curb steps without AD ambulating 265ft and 129ft with SBA.  Pt with  decreased heel strike L LE and B foot flat at times.  He practiced stair  mobility training ascending/descending 20 steps with CGA.  Pt using B HR on 10  steps and 1 UE on HR for other 10.  He participated in dynamic standing balance  activities of weaving around cones with SBA and picking up cones off floor with  CGA.  Patient was returned to or left in bed. Handoff to nurse completed.  Bed alarm in place and activated.  Oriented to call bell and placed within reach.  Personal items within reach.  Assistive devices positioned out of reach.  Bed placed in lowest position.  Telesitter in use.    ASSESSMENT  Pt is motivated to progress and  return to active lifestyle.  Pt participated in  gait training on outdoor surfaces.  He displays impulsive behaviors and requires  v/c for safety during functional mobility.  He will benefit from continued  therapy.    3 Hour Rule Minutes: 60 minutes  of PT treatment this session count towards  intensity and duration of therapy requirement. Patient was seen for the full  scheduled time of PT treatment this session.  Therapy Mode Minutes: Individual: 60 minutes.    Signed by: Dan Humphreys, PT 03/09/2019 3:00:00 PM

## 2019-03-09 NOTE — Rehab Progress Note ([facility]) (Signed)
[name]  MRN: [id number]  Account: [id number]  Session Start: 03/09/2019 12:00:00 AM  Session Stop: 03/09/2019 12:00:00 AM    Total Treatment Minutes:  Minutes    Rehabilitation Nursing  Inpatient Rehabilitation Shift Assessment    Rehab Diagnosis: TBI  Demographics:            Age: 30Y            Gender: Male  Primary Language: English    Date of Onset:  02/22/19  Date of Admission: 03/06/2019 12:57:06 PM    Rehabilitation Precautions Restrictions:   Bilateral lower extremities: WBAT, Risk for falls. Seizure precaution, skin  integrity    Patient Report: "Jonathan [name] nice to meet you, My mother name is Jonathan [name] too"  Patient/Caregiver Goals:  To go back to my normal state, doing my own things    Wounds/Incisions: No wounds or incisions.    Medication Review: No clinically significant medication issues identified this  shift.    Bowel and Bladder Output:                       Bladder (# only)             Bowel (# only)  Number of Episodes  Continent            2                            0  Incontinent          0                            0    Education Provided:    Education Provided: Precautions. Pain management. Pain scale. Pain scale.  Medication options. Side effects. Safety issues and interventions. Fall  protocol. Impulsivity. Altered mental status. Fall prevention/balance training.  Safety. Medication. Name and dosage. Administration. Purpose. Side Effects.       Audience: Patient.       Mode: Explanation.       Response: Needs reinforcement.    Long Term Goals: 1. Patient will  be able to participate in therapy with pain  level 2/10 prior to medicated for pain 100% by d/c   2. Patient will recognize limitations and call for assiaatance before  attempting mobility 100% of the time in order to prevent falls at home   3. Systolic blood pressure will be at or below 150 via medications, diet,and  improved activity level 100% by d/c  Two weeks  Short Term Goals: 1. Patient will  be able to participate in therapy  with pain  level 2/10 prior to medicated for pain 50% by d/c   2. Patient will recognize limitations and call for assiaatance before  attempting mobility 50% of the time in order to prevent falls at home   3. Systolic blood pressure will be at or below 150 via medications, diet,and  improved activity level 50% by d/c  One week    PROGRESS TOWARD GOALS: Risk of falls/injuries.  Intervention(s): Teach patient/ family to evaluate situation and environment for  hazards, Medication review, Fall risk assessment using EBP tool, Use of chair  and/or bed alarms, Make sure call light, personal belongings within the  patient?s reach, Recognize, assist, and anticipate basic needs, Establish and/or  assist with regular toileting program, Provide distraction, direct attention to  other activities, Frequent  safety check, rounding, Use of non-skid footwear,  Maintain adequate lighting, Reinforce safety instruction for transfer  techniques, gait training and use of mobility devices recommended by PT/OT,  Encourage and/assist with safe use of assistive devices during ambulation, Allow  patient and/family to identify safety problems and environmental hazards at home  and determine actions to prevent accidents/ injuries, Allow patient and/or  family to demo learned strategies to prevent falls/injuries    Response to intervention(s): Patient is very impulsive and non-complaint needs  frequent observation and reminder to stay safe.    Ongoing Education/Training: Currently tele sitter in place, and will continue to  reinforce safety precaution education.     SHORT TERM GOAL REVIEW:       1. Patient will  be able to participate in therapy with pain level 2/10  prior to medicated for pain 50% by d/c - Not Met: active goal       2. Patient will recognize limitations and call for assiaatance before  attempting mobility 50% of the time in order to prevent falls at home - Not Met:  Remains impulsive  Time frame to achieve short term goal(s):   One week    PLAN:  Rehab Nursing Specific Interventions:  Continue with the current Nursing Plan of Care.    TEAM CARE PLAN  Identified problems from team documentation:  Problem: Impaired Cardiac Function  Cardiac: Primary Team Goal: Systolic blood pressure will be at or below 150 via  medications, diet,and improved activity level 100% by d/c/    Problem: Impaired Cognition  Cognition: Primary Team Goal: Pt will utilize internal/external compensatory  memory strategies to support new learning of precautions, medical management, as  well as brain injury education w/ only min-mod verbal cues from  staff/caregiver./Active    Problem: Impaired Mobility  Mobility: Primary Team Goal: Pt will be modI for bed mobility, and ambulation  with LRAD and SPV for stairs to return home safely./Active    Problem: Impaired Pain Management  Pain Mgmt: Primary Team Goal: Patient will  be able to participate in therapy  with pain level 2/10 prior to medicated for pain 100% by d/c/    Problem: Impaired Psychosocial Skills/Behavior  PsychoSocial: Primary Team Goal: Patient will regularly employ adaptive coping  skills for adjusting to medical illness, reduced function, and rehab  hospitalization via maintaining adherence with his rehab plan of care, and  expressing positive expectations for increasing his overall future functional  independence./    Problem: Impaired Self-care Mgmt/ADL/IADL  Self Care: Primary Team Goal: Pt's caregiver will provide recommended  environment, and supervision in order for pt to complete self are tasks at mod  I/supervision level and return home safely./Active    Problem: Safety Risk and Restraint  Safety: Primary Team Goal: Patient will recognize limitations and call for  assiaatance before attempting mobility 100% of the time in order to prevent  falls at home/    Please review Integrated Patient View Care Plan Flowsheet for Team identified  Problems, Interventions, and Goals.    Signed by: Armandina Gemma, RN 03/09/2019 2:22:00 AM

## 2019-03-09 NOTE — Progress Notes (Signed)
PROGRESS NOTE -- FACE-TO-FACE [facility]  PHYSICAL MEDICINE AND REHABILITATION      Date Time: 03/09/19 8:42 AM  Patient Name: Jonathan [name], Jonathan [name]    Admission date:  03/06/2019    Subjective:   No new complaints.  No new concerns with pain or sleep.      Review of Systems:   No new issues overnight.  No new pain.          Medications:   Medications reviewed by me:     Scheduled Meds: PRN Meds:    cetirizine, 10 mg, Oral, Daily  gabapentin, 300 mg, Oral, TID  guaiFENesin, 600 mg, Oral, Q12H SCH  heparin (porcine), 5,000 Units, Subcutaneous, Q8H SCH  levETIRAcetam, 750 mg, Oral, Q12H SCH  lidocaine, 1 patch, Transdermal, Q24H  losartan, 100 mg, Oral, Daily  pantoprazole, 40 mg, Oral, QAM AC  polyethylene glycol, 17 g, Oral, Daily  sertraline, 25 mg, Oral, Daily  traZODone, 50 mg, Oral, QHS        Continuous Infusions:   acetaminophen, 500 mg, Q6H PRN  bisacodyl, 10 mg, BID PRN  HYDROcodone-acetaminophen, 1 tablet, Q4H PRN  melatonin, 3 mg, QHS PRN          Medication Review  1. A complete drug regimen review was completed: Yes  2. Were any drug issues found during review?: No   If yes    Was I contacted and action was taken by midnight of the next calendar day once issue was identified?: N/A    Person who contacted me: N/A   What was the issue?: N/A   Action taken: N/A   What was the time the issue was identified?: N/A      Physical Exam:     Vitals:    03/08/19 1117 03/08/19 1715 03/09/19 0501 03/09/19 0805   BP: 146/87 121/68 113/71 124/72   Pulse: 60 60 60    Resp:  18 18    Temp: 97.7 F (36.5 C) 98.1 F (36.7 C) 98.1 F (36.7 C)    TempSrc: Oral Oral Oral    SpO2:  95% 95%    Weight:       Height:           Intake and Output Summary (Last 24 hours) at Date Time    Intake/Output Summary (Last 24 hours) at 03/09/2019 1610  Last data filed at 03/08/2019 2200  Gross per 24 hour   Intake 920 ml   Output -   Net 920 ml     P.O.: 120 mL (03/08/19 2200)     Urine: 0 mL (03/07/19 0500)             Chest / Lungs:  symmetric  movement of chest  Heart: RRR, S1S2  Abdomen: NT, non distended, NAD  Extr: no edema of BLE    Labs:   No results for input(s): GLUCOSEWHOLE in the last 24 hours.    Recent Labs   Lab 03/08/19  1250 03/07/19  0705   WBC 11.54* 11.82*   Hgb 12.4* 12.4*   Hematocrit 36.8* 36.6*   Platelets 585* 574*        Recent Labs   Lab 03/08/19  1250 03/07/19  0705   Sodium 134* 133*   Potassium 4.6 4.8   Chloride 100 102   CO2 24 22   BUN 17 16   Creatinine 0.9 0.8   Calcium 9.4 9.0   Albumin  --  2.7*   Protein, Total  --  5.9*   Bilirubin, Total  --  0.5   Alkaline Phosphatase  --  121*   ALT  --  24   AST (SGOT)  --  19   Glucose 139* 153*       Recent Labs   Lab 03/08/19  1250   PT INR 1.0       Results     Procedure Component Value Units Date/Time    Hemoglobin A1C [[id number]]  (Abnormal) Collected:  03/08/19 1250    Specimen:  Blood Updated:  03/08/19 1936     Hemoglobin A1C 6.1 %      Average Estimated Glucose 128.4 mg/dL     Narrative:       This is NOT the correct Test for Patients with  Hemoglobinopathy.  Rescheduled by [id number] at 03/08/2019 10:53 Reason: Patient unavailable    Basic Metabolic Panel [[id number]]  (Abnormal) Collected:  03/08/19 1250     Updated:  03/08/19 1330     Glucose 139 mg/dL      BUN 17 mg/dL      Creatinine 0.9 mg/dL      Calcium 9.4 mg/dL      Sodium 782 mEq/L      Potassium 4.6 mEq/L      Chloride 100 mEq/L      CO2 24 mEq/L      Anion Gap 10.0    Narrative:       Rescheduled by [id number] at 03/08/2019 10:53 Reason: Patient unavailable    GFR [[id number]] Collected:  03/08/19 1250     Updated:  03/08/19 1330     EGFR >60.0    Narrative:       Rescheduled by [id number] at 03/08/2019 10:53 Reason: Patient unavailable    Prothrombin time/INR [[id number]] Collected:  03/08/19 1250    Specimen:  Blood Updated:  03/08/19 1325     PT 13.2 sec      PT INR 1.0    Narrative:       Rescheduled by [id number] at 03/08/2019 10:53 Reason: Patient unavailable    CBC without differential [[id number]]  (Abnormal) Collected:   03/08/19 1250     Updated:  03/08/19 1315     WBC 11.54 x10 3/uL      Hgb 12.4 g/dL      Hematocrit 01.0 %      Platelets 585 x10 3/uL      RBC 4.13 x10 6/uL      MCV 89.1 fL      MCH 30.0 pg      MCHC 33.7 g/dL      RDW 15 %      MPV 9.0 fL      Nucleated RBC 0.0 /100 WBC      Absolute NRBC 0.00 x10 3/uL     Narrative:       This is NOT the correct Test for Patients with  Hemoglobinopathy.  Rescheduled by [id number] at 03/08/2019 10:53 Reason: Patient unavailable             Rads:   Radiological Procedure reviewed.  Radiology Results (24 Hour)     ** No results found for the last 24 hours. **            Assessment:   Continue with acute inpatient rehabilitation with close medical supervision for TBI (traumatic brain injury).     Patient Active Problem List   Diagnosis   . Essential hypertension   . Coronary artery disease involving native coronary artery of native  heart without angina pectoris   . On anticoagulant therapy   . Cardiac pacemaker in situ--leadless nano stim   . H/O sick sinus syndrome   . Bronchiectasis   . Atrial fibrillation   . Dyspnea on exertion   . Hyperlipidemia   . History of TIA (transient ischemic attack)   . Migraine equivalent   . Neck pain   . Back pain   . Cold hands and feet   . Bilirubinemia   . Anxiety   . History of basal cell cancer   . TBI (traumatic brain injury)       Plan:     Neurology consultation appreciated. Continue with Keppra, no sz during current admission.    Continue PT, OT in acute rehabilitation medical setting.  Continue comprehensive and intensive inpatient rehab program, including:   Physical therapy 60-120 min daily, 5-6 times per week, Occupational therapy 60-120 min daily, 5-6 times per week, Case management and Rehabilitation nursing    Signed by: Leretha Dykes MD    [facility]      If there are questions or concerns about the content of this note or information contained within the body of this dictation they should be  addressed directly with the author for clarification.

## 2019-03-09 NOTE — Rehab Progress Note ([facility]) (Signed)
NAMENOHLAN BURDIN  MRN: [id number]  Account: [id number]  Session Start: 03/09/2019 9:00:00 AM  Session Stop: 03/09/2019 10:00:00 AM    Total Treatment Minutes: 60.00 Minutes    Speech Language Pathology  Inpatient Rehabilitation Progress Note - Brief    Rehab Diagnosis: TBI  Demographics:            Age: 60Y            Gender: Male  Rehabilitation Precautions/Restrictions:   Bilateral lower extremities: WBAT, Risk for falls. Seizure precaution, skin  integrity    SUBJECTIVE  Patient Report: "I have always had a hard time with memory."  Pain: Patient currently without complaints of pain.    OBJECTIVE    Interventions:       Speech Treatment: Pt greeted bedside agreeable to ST. Pt attempting to  leave the room without walker despite instructions on white board, indicating  decreased safety awareness and reduced insight. SLP educated pt on safety and  need for walker outside of the room. Pt reluctantly agreed. Remainder of session  targeting memory strategies, brain injury education, and medication management.  Pt demonstrating knowledge of external memory strategies; however unable to  recall any internal. Therefore SLP provided pt with printed handout outlining  recommended internal strategies. Pt reporting familiarity with associations and  visualization. To practice using associations, pt given 5 people and asked to  recall their names. Pt able to immediately recall 4/5 names independently which  increased to 5/5 given min cues. Brain injury education targeting fatigue post  TBI and strategies to overcome. Pt given printed handout and expressed  understanding. Lastly, SLP and pt reviewed current medications. Pt able to  recall 2 medications; however unable to state any of their functions. Medication  chart outlining name / purpose completed with mod cues from SLP.  Patient was returned to or left in bed. Handoff to nurse completed.  Bed alarm in place and activated.  Oriented to call bell and placed within  reach.  Personal items within reach.  Assistive devices positioned out of reach.  Bed placed in lowest position.  Pain Reassessment: Pain was not reassessed as no pain was reported.    Education Provided:    Education Provided:  Brain Injury: Signs /T/ Symptoms associated with brain injury, Rancho scale,  Supervision/assistance requirements Cognitive functioning. Compensatory  cognitive strategies/aids       Audience: Patient.       Mode: Explanation.  Demonstration.  Teacher, English as a foreign language provided.       Response: Verbalized understanding.  Demonstrated skill.  Needs practice.    ASSESSMENT  Pt with reduced insight into deficits, impacting safety within functional  mobility when ambulating to ST offices. Pt also demonstrating poor recall of  current medications / their purpose. SLP recommendations further emphasis on  med. management in future sessions.    PLAN  Continued Speech Language Pathology is recommended to address:  Recommended Frequency/Duration/Intensity: 60-120 mins of skilled SLP services  5-6 days/week for 7-10 days  Continued Activities Contributing Toward Care Plan: cognitive-linguistic  retraining, compensatory strategy acquisition, brain injury education, and d/c  planning    3 Hour Rule Minutes: 60 minutes of SLP treatment this session count towards  intensity and duration of therapy requirement. Patient was seen for the full  scheduled time of SLP treatment this session.  Therapy Mode Minutes: Individual: 60 minutes.    Signed by: Barron Alvine, CCC/SLP 03/09/2019 10:00:00 AM

## 2019-03-09 NOTE — Rehab Progress Note ([facility]inks) (Signed)
NAMEMICKEAL DAWS  MRN: [id number]  Account: [id number]  Session Start: 03/09/2019 10:00:00 AM  Session Stop: 03/09/2019 11:00:00 AM    Total Treatment Minutes: 60.00 Minutes    Occupational Therapy  Inpatient Rehabilitation Treatment Note    Rehab Diagnosis: TBI  Demographics:            Age: 47Y            Gender: Male  Rehabilitation Precautions/Restrictions:   Bilateral lower extremities: WBAT, Risk for falls. Seizure precaution, skin  integrity    Vital Signs:                       Before Activity              After Activity  Vitals  Position/Activity    seated                       -  BP Systolic          101                          -  BP Diastolic         67                           -  Pulse                60                           -    Interventions:       Therapeutic Activities:  OT engaged pt in complex/compound activity to  provoke dynamic standing balance during  visual tracking using the BITS system.  He complete several cancellation and trail making/maze tasks on large touch  screen in standing. He demo'd good lateral weight shifts and no overt LOB while  reaching. He requested seated breaks when rib pain increased with dynamic reach  and prolonged standing.  Patient was returned to or left in bed. Handoff to nurse completed.  Bed alarm in place and activated.  Oriented to call bell and placed within reach.  Personal items within reach.    ASSESSMENT  Pt submitted average performance on BITS vision scanning/tracking assessments.  His search pattern was systematic. No overt omissions. Pt seems to be using  effective strategies for scanning and maintained adequate attention. Rib pain  limited his standing/reaching tolerance.  He demonstrates decreased safety  awareness (obstacle navigation, clutter) and is not compliant with fall safety  recommendations. Continue OT POC to address transition to home safely.    3 Hour Rule Minutes: 60 minutes of OT treatment this session count towards  intensity and  duration of therapy requirement. Patient was seen for the full  scheduled time of OT treatment this session.  Therapy Mode Minutes: Individual: 60 minutes.    Signed by: Hermina Staggers, OTR/L 03/09/2019 11:00:00 AM
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
## 2019-03-10 LAB — CBC
Absolute NRBC: 0 10*3/uL (ref 0.00–0.00)
Hematocrit: 36 % — ABNORMAL LOW (ref 37.6–49.6)
Hgb: 12 g/dL — ABNORMAL LOW (ref 12.5–17.1)
MCH: 29.8 pg (ref 25.1–33.5)
MCHC: 33.3 g/dL (ref 31.5–35.8)
MCV: 89.3 fL (ref 78.0–96.0)
MPV: 9 fL (ref 8.9–12.5)
Nucleated RBC: 0 /100 WBC (ref 0.0–0.0)
Platelets: 592 10*3/uL — ABNORMAL HIGH (ref 142–346)
RBC: 4.03 10*6/uL — ABNORMAL LOW (ref 4.20–5.90)
RDW: 15 % (ref 11–15)
WBC: 10.13 10*3/uL — ABNORMAL HIGH (ref 3.10–9.50)

## 2019-03-10 LAB — PT/INR
PT INR: 1 (ref 0.9–1.1)
PT: 13.2 s (ref 12.6–15.0)

## 2019-03-10 MED ORDER — LACOSAMIDE 50 MG PO TABS
150.0000 mg | ORAL_TABLET | Freq: Two times a day (BID) | ORAL | Status: DC
Start: 2019-03-10 — End: 2019-03-20
  Administered 2019-03-10 – 2019-03-20 (×20): 150 mg via ORAL
  Filled 2019-03-10 (×7): qty 3
  Filled 2019-03-10: qty 1
  Filled 2019-03-10 (×5): qty 3
  Filled 2019-03-10: qty 2
  Filled 2019-03-10 (×5): qty 3
  Filled 2019-03-10: qty 2
  Filled 2019-03-10: qty 3

## 2019-03-10 MED ORDER — WARFARIN SODIUM 5 MG PO TABS
10.0000 mg | ORAL_TABLET | Freq: Every day | ORAL | Status: DC
Start: 2019-03-10 — End: 2019-03-11
  Administered 2019-03-10: 18:00:00 10 mg via ORAL
  Filled 2019-03-10: qty 2

## 2019-03-10 NOTE — Rehab Progress Note ([facility]) (Signed)
[name]  MRN: [id number]  Account: [id number]  Session Start: 03/10/2019 12:00:00 AM  Session Stop: 03/10/2019 12:00:00 AM    Total Treatment Minutes:  Minutes    Rehabilitation Nursing  Inpatient Rehabilitation Shift Assessment    Rehab Diagnosis: TBI  Demographics:            Age: 65Y            Gender: Male  Primary Language: English    Date of Onset:  02/22/19  Date of Admission: 03/06/2019 12:57:06 PM    Rehabilitation Precautions Restrictions:   Bilateral lower extremities: WBAT, Risk for falls. Seizure precaution, skin  integrity    Patient Report: "I am ok"  Patient/Caregiver Goals:  To go back to my normal state, doing my own things    Wounds/Incisions: No wounds or incisions.    Medication Review: No clinically significant medication issues identified this  shift.    Bowel and Bladder Output:                       Bladder (# only)             Bowel (# only)  Number of Episodes  Continent            2                            0  Incontinent          0                            0    Education Provided:    Education Provided: Precautions. Pain management. Pain scale. Medication  options. Side effects. Clinical indicators of pain. Safety issues and  interventions. Fall protocol. Impulsivity. Altered mental status. Fall  prevention/balance training. Safety. Medication. Name and dosage.  Administration. Purpose. Side Effects.       Audience: Patient.       Mode: Explanation.       Response: Verbalized understanding.  Needs reinforcement.    Long Term Goals: 1. Patient will  be able to participate in therapy with pain  level 2/10 prior to medicated for pain 100% by d/c   2. Patient will recognize limitations and call for assiaatance before  attempting mobility 100% of the time in order to prevent falls at home   3. Systolic blood pressure will be at or below 150 via medications, diet,and  improved activity level 100% by d/c  Two weeks  Short Term Goals: 1. Patient will  be able to participate in therapy with  pain  level 2/10 prior to medicated for pain 50% by d/c - Goal Not Met   2. Patient will recognize limitations and call for assiaatance before  attempting mobility 50% of the time in order to prevent falls at home - Goal Not  Met   3. Systolic blood pressure will be at or below 150 via medications, diet,and  improved activity level 50% by d/c  One week    PROGRESS TOWARD GOALS: Risk of falls/injuries.  Intervention(s): Teach patient/ family to evaluate situation and environment for  hazards, Medication review, Fall risk assessment using EBP tool, Use of chair  and/or bed alarms, Make sure call light, personal belongings within the  patient?s reach, Recognize, assist, and anticipate basic needs, Establish and/or  assist with regular toileting program, Provide distraction, direct  attention to  other activities, Frequent safety check, rounding, Use of non-skid footwear,  Maintain adequate lighting, Reinforce safety instruction for transfer  techniques, gait training and use of mobility devices recommended by PT/OT,  Encourage and/assist with safe use of assistive devices during ambulation, Allow  patient and/family to identify safety problems and environmental hazards at home  and determine actions to prevent accidents/ injuries, Allow patient and/or  family to demo learned strategies to prevent falls/injuries    Response to intervention(s): Patient is very impulsive and non-complaint needs  frequent observation and reminder to stay safe.    Ongoing Education/Training: Currently tele sitter in place, and will continue to  reinforce safety precaution education.     SHORT TERM GOAL REVIEW:       2. Patient will recognize limitations and call for assiaatance before  attempting mobility 50% of the time in order to prevent falls at home - Not Met:  Pt. remains impulsive  Time frame to achieve short term goal(s):  One week   LONG TERM GOAL REVIEW:       2. Patient will recognize limitations and call for assiaatance  before  attempting mobility 100% of the time in order to prevent falls at home - Not Met  on going goal pt. remains impulsive  Timeframe to achieve long term goal(s):  Two weeks    PLAN:  Rehab Nursing Specific Interventions:  Continue with the current Nursing Plan of Care.    TEAM CARE PLAN  Identified problems from team documentation:  Problem: Impaired Cardiac Function  Cardiac: Primary Team Goal: Systolic blood pressure will be at or below 150 via  medications, diet,and improved activity level 100% by d/c/    Problem: Impaired Cognition  Cognition: Primary Team Goal: Pt will utilize internal/external compensatory  memory strategies to support new learning of precautions, medical management, as  well as brain injury education w/ only min-mod verbal cues from  staff/caregiver./Active    Problem: Impaired Mobility  Mobility: Primary Team Goal: Pt will be modI for bed mobility, and ambulation  with LRAD and SPV for stairs to return home safely./Active    Problem: Impaired Pain Management  Pain Mgmt: Primary Team Goal: Patient will  be able to participate in therapy  with pain level 2/10 prior to medicated for pain 100% by d/c/    Problem: Impaired Psychosocial Skills/Behavior  PsychoSocial: Primary Team Goal: Patient will regularly employ adaptive coping  skills for adjusting to medical illness, reduced function, and rehab  hospitalization via maintaining adherence with his rehab plan of care, and  expressing positive expectations for increasing his overall future functional  independence./    Problem: Impaired Self-care Mgmt/ADL/IADL  Self Care: Primary Team Goal: Pt's caregiver will provide recommended  environment, and supervision in order for pt to complete self are tasks at mod  I/supervision level and return home safely./Active    Problem: Safety Risk and Restraint  Safety: Primary Team Goal: Patient will recognize limitations and call for  assiaatance before attempting mobility 100% of the time in order to  prevent  falls at home/    Please review Integrated Patient View Care Plan Flowsheet for Team identified  Problems, Interventions, and Goals.    Signed by: Armandina Gemma, RN 03/10/2019 1:55:00 AM

## 2019-03-10 NOTE — Progress Notes (Addendum)
PROGRESS NOTE -- FACE-TO-FACE [facility]  PHYSICAL MEDICINE AND REHABILITATION      Date Time: 03/10/19 9:46 AM  Patient Name: Jonathan [name], Jonathan [name]    Admission date:  03/06/2019    Subjective:   No new pain.  No new issues overnight.      Review of Systems:   No new problems with pain or sleep.        Medications:   Medication reviewed by me:     Scheduled Meds: PRN Meds:    cetirizine, 10 mg, Oral, Daily  gabapentin, 300 mg, Oral, TID  guaiFENesin, 600 mg, Oral, Q12H SCH  heparin (porcine), 5,000 Units, Subcutaneous, Q8H SCH  lacosamide, 100 mg, Oral, BID  lidocaine, 1 patch, Transdermal, Q24H  losartan, 50 mg, Oral, Daily  pantoprazole, 40 mg, Oral, QAM AC  polyethylene glycol, 17 g, Oral, Daily  sertraline, 25 mg, Oral, Daily  traZODone, 50 mg, Oral, QHS  warfarin, 5 mg, Oral, Daily at 1800        Continuous Infusions:   acetaminophen, 500 mg, Q6H PRN  bisacodyl, 10 mg, BID PRN  HYDROcodone-acetaminophen, 1 tablet, Q4H PRN  melatonin, 3 mg, QHS PRN          Medication Review  1. A complete drug regimen review was completed: Yes  2. Were any drug issues found during review?: No   If yes    Was I contacted and action was taken by midnight of the next calendar day once issue was identified?: N/A    Person who contacted me: N/A   What was the issue?: N/A   Action taken: N/A   What was the time the issue was identified?: N/A        Physical Exam:     Vitals:    03/09/19 1452 03/09/19 1630 03/10/19 0550 03/10/19 0816   BP: 93/60 118/71 111/69 124/74   Pulse: 76 (!) 59 60    Resp:   18    Temp:  98.1 F (36.7 C) 98.1 F (36.7 C)    TempSrc:  Oral Oral    SpO2:  96% 97%    Weight:       Height:           Intake and Output Summary (Last 24 hours) at Date Time    Intake/Output Summary (Last 24 hours) at 03/10/2019 0946  Last data filed at 03/10/2019 0310  Gross per 24 hour   Intake 600 ml   Output -   Net 600 ml     P.O.: 240 mL (03/10/19 0310)     Urine: 0 mL (03/07/19 0500)             NAD  Chest / Lungs:  symmetric movement  of chest  Heart: regular rate and rhythm  Abd: NT, ND  Extr: no pitting edema of BLE    Labs:   No results for input(s): GLUCOSEWHOLE in the last 24 hours.    Recent Labs   Lab 03/10/19  0729 03/09/19  0852 03/08/19  1250 03/07/19  0705   WBC 10.13* 10.83* 11.54* 11.82*   Hgb 12.0* 12.7 12.4* 12.4*   Hematocrit 36.0* 38.1 36.8* 36.6*   Platelets 592* 627* 585* 574*        Recent Labs   Lab 03/09/19  0852 03/08/19  1250 03/07/19  0705   Sodium 135* 134* 133*   Potassium 5.0 4.6 4.8   Chloride 100 100 102   CO2 24 24 22    BUN 15  17 16   Creatinine 0.8 0.9 0.8   Calcium 9.7 9.4 9.0   Albumin  --   --  2.7*   Protein, Total  --   --  5.9*   Bilirubin, Total  --   --  0.5   Alkaline Phosphatase  --   --  121*   ALT  --   --  24   AST (SGOT)  --   --  19   Glucose 106* 139* 153*       Recent Labs   Lab 03/10/19  0729 03/09/19  0852 03/08/19  1250   PT INR 1.0 1.0 1.0                 Results     Procedure Component Value Units Date/Time    Prothrombin time/INR [[id number]] Collected:  03/10/19 0729    Specimen:  Blood Updated:  03/10/19 0803     PT 13.2 sec      PT INR 1.0    CBC without differential [[id number]]  (Abnormal) Collected:  03/10/19 0729    Specimen:  Blood Updated:  03/10/19 0755     WBC 10.13 x10 3/uL      Hgb 12.0 g/dL      Hematocrit 78.2 %      Platelets 592 x10 3/uL      RBC 4.03 x10 6/uL      MCV 89.3 fL      MCH 29.8 pg      MCHC 33.3 g/dL      RDW 15 %      MPV 9.0 fL      Nucleated RBC 0.0 /100 WBC      Absolute NRBC 0.00 x10 3/uL     GFR [[id number]] Collected:  03/09/19 0852     Updated:  03/09/19 1647     EGFR >60.0    Basic Metabolic Panel [[id number]]  (Abnormal) Collected:  03/09/19 0852    Specimen:  Blood Updated:  03/09/19 1647     Glucose 106 mg/dL      BUN 15 mg/dL      Creatinine 0.8 mg/dL      Calcium 9.7 mg/dL      Sodium 528 mEq/L      Potassium 5.0 mEq/L      Chloride 100 mEq/L      CO2 24 mEq/L      Anion Gap 11.0    Prothrombin time/INR [[id number]] Collected:  03/09/19 0852    Specimen:   Blood Updated:  03/09/19 1003     PT 13.0 sec      PT INR 1.0    Narrative:       Rescheduled by [id number] at 03/09/2019 08:10 Reason: Difficult draw/Unable   to collect specimen nurse alex aware          Rads:   Radiological Procedure reviewed.  Radiology Results (24 Hour)     ** No results found for the last 24 hours. **            Assessment:     Medically stable to continue with acute inpatient rehab for TBI (traumatic brain injury).    Patient Active Problem List   Diagnosis   . Essential hypertension   . Coronary artery disease involving native coronary artery of native heart without angina pectoris   . On anticoagulant therapy   . Cardiac pacemaker in situ--leadless nano stim   . H/O sick sinus syndrome   . Bronchiectasis   .  Atrial fibrillation   . Dyspnea on exertion   . Hyperlipidemia   . History of TIA (transient ischemic attack)   . Migraine equivalent   . Neck pain   . Back pain   . Cold hands and feet   . Bilirubinemia   . Anxiety   . History of basal cell cancer   . TBI (traumatic brain injury)         Plan:   WBC steadily decreasing and sodium 135 today. Continue with present management.    Patient started on Coumadin per internal medicine, f/u PT/INR and repeat CTH once therapeutic.    Continue PT, OT in acute rehabilitation medical setting.  Continue comprehensive and intensive inpatient rehab program, including:   Physical therapy 60-120 min daily, 5-6 times per week, Occupational therapy 60-120 min daily, 5-6 times per week, Case management and Rehabilitation nursing    Signed by: Leretha Dykes MD    [facility]    If there are questions or concerns about the content of this note or information contained within the body of this dictation they should be addressed directly with the author for clarification.

## 2019-03-10 NOTE — Rehab IRF Data Coll Rights Priv Act ([facility]) (Signed)
[name]  MRN: [id number]  Account: [id number]  Session Start: 03/07/2019 12:00:00 AM  Session Stop: 03/07/2019 12:00:00 AM    Total Treatment Minutes:  Minutes      IRFPPS Data Collection Rights and Rehab Privacy Act Notice: IRF-PAI Data  Collection Rights and Rehab Privacy Act Statement provided/given to patient and  family.    Signed by: Lane Hacker, OTR/L, PPS Coordinator 03/07/2019 8:00:00 AM

## 2019-03-10 NOTE — Rehab Progress Note ([facility]) (Signed)
Jonathan [name]  MRN: [id number]  Account: [id number]  Session Start: 03/10/2019 12:00:00 AM  Session Stop: 03/10/2019 12:00:00 AM    Total Treatment Minutes:  Minutes    Rehabilitation Nursing  Inpatient Rehabilitation Shift Assessment    Rehab Diagnosis: TBI  Demographics:            Age: 33Y            Gender: Male  Primary Language: English    Date of Onset:  02/22/19  Date of Admission: 03/06/2019 12:57:06 PM    Rehabilitation Precautions Restrictions:   Bilateral lower extremities: WBAT, Risk for falls. Seizure precaution, skin  integrity    Patient Report: I am forgeetful  Patient/Caregiver Goals:  To go back to my normal state, doing my own things    Wounds/Incisions: No wounds or incisions.    Medication Review: No clinically significant medication issues identified this  shift.    Bowel and Bladder Output:                       Bladder (# only)             Bowel (# only)  Number of Episodes  Continent            1                            0  Incontinent          0                            0    Education Provided:    Education Provided: Precautions. Pain management. Pain scale. Medication  options. Side effects. Clinical indicators of pain. Plan of care. Safety.  Medication. Name and dosage. Administration. Purpose. Side Effects. Interaction.         Audience: Patient.       Mode: Explanation.  Teacher, English as a foreign language provided.       Response: Applied knowledge.  Needs practice.    Long Term Goals: 1. Patient will  be able to participate in therapy with pain  level 2/10 prior to medicated for pain 100% by d/c   2. Patient will recognize limitations and call for assiaatance before  attempting mobility 100% of the time in order to prevent falls at home - Goal  Not Met   3. Systolic blood pressure will be at or below 150 via medications, diet,and  improved activity level 100% by d/c  Two weeks  Short Term Goals: 1. Patient will  be able to participate in therapy with pain  level 2/10 prior to medicated for pain 50% by  d/c - Goal Not Met   2. Patient will recognize limitations and call for assiaatance before  attempting mobility 50% of the time in order to prevent falls at home - Goal Not  Met   3. Systolic blood pressure will be at or below 150 via medications, diet,and  improved activity level 50% by d/c  One week    PROGRESS TOWARD GOALS: Pain management.  Interventions: Evaluate, assess outcomes, and/or effectiveness of pain  management    Response to intervention(s): R ribs pain    Ongoing Education/Training: educate pt to have pain meds if he needed     Risk of falls/injuries.  Intervention(s): Use of chair and/or bed alarms, Make sure call light, personal  belongings within  the patient?s reach, Frequent safety check, rounding    Response to intervention(s): pt is impulsive    Ongoing Education/Training: educate pt to call before OOB     Cardiac. Monitor lab values/vital signs    Response to intervention(s): Bp is normal    Ongoing Education/Training: educate pt monitor BP     SHORT TERM GOAL REVIEW:       1. Patient will  be able to participate in therapy with pain level 2/10  prior to medicated for pain 50% by d/c - Not Met: ongoing       2. Patient will recognize limitations and call for assiaatance before  attempting mobility 50% of the time in order to prevent falls at home - Not Met:  ongoing       3. Systolic blood pressure will be at or below 150 via medications,  diet,and improved activity level 50% by d/c - Not Met: ongoing  Time frame to achieve short term goal(s):  One week    PLAN:  Rehab Nursing Specific Interventions:  Continue with the current Nursing Plan of Care.    TEAM CARE PLAN  Identified problems from team documentation:  Problem: Impaired Cardiac Function  Cardiac: Primary Team Goal: Systolic blood pressure will be at or below 150 via  medications, diet,and improved activity level 100% by d/c/    Problem: Impaired Cognition  Cognition: Primary Team Goal: Pt will utilize internal/external  compensatory  memory strategies to support new learning of precautions, medical management, as  well as brain injury education w/ only min-mod verbal cues from  staff/caregiver./Active    Problem: Impaired Mobility  Mobility: Primary Team Goal: Pt will be modI for bed mobility, and ambulation  with LRAD and SPV for stairs to return home safely./Active    Problem: Impaired Pain Management  Pain Mgmt: Primary Team Goal: Patient will  be able to participate in therapy  with pain level 2/10 prior to medicated for pain 100% by d/c/    Problem: Impaired Psychosocial Skills/Behavior  PsychoSocial: Primary Team Goal: Patient will regularly employ adaptive coping  skills for adjusting to medical illness, reduced function, and rehab  hospitalization via maintaining adherence with his rehab plan of care, and  expressing positive expectations for increasing his overall future functional  independence./    Problem: Impaired Self-care Mgmt/ADL/IADL  Self Care: Primary Team Goal: Pt's caregiver will provide recommended  environment, and supervision in order for pt to complete self are tasks at mod  I/supervision level and return home safely./Active    Problem: Safety Risk and Restraint  Safety: Primary Team Goal: Patient will recognize limitations and call for  assiaatance before attempting mobility 100% of the time in order to prevent  falls at home/    Please review Integrated Patient View Care Plan Flowsheet for Team identified  Problems, Interventions, and Goals.    Signed by: Johny Blamer, RN 03/10/2019 10:18:00 AM
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
## 2019-03-10 NOTE — Rehab PPS CMG ([facility]) (Addendum)
Corrected 03/27/2019 10:20:27 AM    NAME: Jonathan [name]  MRN: [id number]  Account: [id number]  Session Start: 03/08/2019 12:00:00 AM  Session Stop: 03/08/2019 12:00:00 AM    Total Treatment Minutes:  Minutes    PPS CMG Coordinator  Inpatient Rehabilitation Admission    IRF Admission Date:  03/06/19      Admission Class: Initial Rehab.  [facility]  Pre-Hospital Living: Home. Pre-Hospital Living  With: (2) Family/Relatives.  Payer Source: Primary: Medicare Fee for Service  Secondary: Not Listed.  Additional InformationCopywriter, advertising.  Ethnic Group:  Caucasian.  Marital Status:  Marital Status: Married.    Impairment Group: 02.22 Traumatic, closed injury  Date of Onset of Impairment: 02/22/19    Etiologic Diagnosis Code(s):   Rank Code      Description  1    S06.361A  Traumatic hemorrhage of cerebrum, unspecified,                 with loss of consciousness of 30 minutes or                 less, initial encounter    Comorbidities:   Rank Code      Description  1    S22.038A  Other fracture of third thoracic vertebra,                 initial encounter for closed fracture    Are there any arthritis conditions recorded for Impairment Group, Etiologic  Diagnosis, or Comorbid Conditions that meet all of the regulatory requirements  for IRF classification (in 42 CFR 412.29(b)(2)(x), (xi), and xii))? No    Cognitive Patterns: Brief Interview for Mental Status (BIMS) was conducted.  Repetition of Three Words: Three words  Able to report correct year: Correct  Able to report correct month: Accurate within 5 days  Able to report correct day of the week: Correct  Able to recall "sock": Yes, no cue required  Able to recall "blue": Yes, no cue required  Able to recall "bed": Yes, no cue required    BIMS SUMMARY SCORE: 15 Cognitively intact Patient was able to complete the Brief  Interview for Mental Status    Self Care Admission Performance                       Performance  GG0130. Self Care  Eating                5 - Setup or clean-up assistance  Oral hygiene         3 - Partial/moderate assistance  Toileting hygiene    3 - Partial/moderate assistance  Shower/bathe self    3 - Partial/moderate assistance  Upper body dressing  3 - Partial/moderate assistance  Lower body dressing  3 - Partial/moderate assistance  Footwear on/off      3 - Partial/moderate assistance  Mobility Admission Performance                       Performance  GG0170. Mobility  Roll left/right      88 - Not attempted due to medical or safety concerns  Sit to lying         4 - Supervision or touching assistance  Lying to sitting bed 3 - Partial/moderate assistance  Sit to stand         4 - Supervision or touching assistance  Chair/bed transfer   4 - Supervision or  touching assistance  Toilet transfer      4 - Supervision or touching assistance  Car transfer         88 - Not attempted due to medical or safety concerns  Walk 10 feet         4 - Supervision or touching assistance  Walk 50 ft 2 turns   4 - Supervision or touching assistance  Walk 150 feet        4 - Supervision or touching assistance  10 ft uneven surface 88 - Not attempted due to medical or safety concerns  1 step (curb)        4 - Supervision or touching assistance  4 steps              4 - Supervision or touching assistance  12 steps             88 - Not attempted due to medical or safety concerns  Picking up object    32 - Not attempted due to medical or safety concerns  Use wheelchair?      Yes  Wheel 50 ft 2 turns  3 - Partial/moderate assistance  Wheelchair Type      Manual  Wheel 150 feet       3 - Partial/moderate assistance  Wheelchair Used      Manual  Care Tool Bowel and Bladder: Bladder Continence: Always continent (no documented  incontinence).  Bowel Continence: Always continent (no documented incontinence).    MEDICAL NEEDS  Height on Admission: 72 inches.  Weight on Admission: 206.1 pounds.  Swallowing/Nutritional Status: Regular food (solids and liquids swallowed  safely  without supervision or modified food or liquid consistency).    QUALITY INDICATORS  Skin Conditions: Unhealed Pressure Ulcer/Injuries at Stage 1 or Higher on  Admission:  No.    Active Diagnosis: Comorbidities and Co-existing Conditions:   Patient does not  have PAD, PVD, or Diabetes Mellitus    Medication:  Potential Clinically Significant Medication Issues: No issues found during  review    Signed by: Lane Hacker, OTR/L, PPS Coordinator 03/08/2019 4:00:00 PM

## 2019-03-11 ENCOUNTER — Encounter
Admission: RE | Disposition: A | Discharge status: Home Health | Payer: Self-pay | Source: Other Acute Inpatient Hospital | Attending: Pain Medicine

## 2019-03-11 ENCOUNTER — Inpatient Hospital Stay: Payer: Medicare Other

## 2019-03-11 DIAGNOSIS — J90 Pleural effusion, not elsewhere classified: Secondary | ICD-10-CM

## 2019-03-11 DIAGNOSIS — J479 Bronchiectasis, uncomplicated: Secondary | ICD-10-CM

## 2019-03-11 DIAGNOSIS — S2241XD Multiple fractures of ribs, right side, subsequent encounter for fracture with routine healing: Secondary | ICD-10-CM

## 2019-03-11 DIAGNOSIS — R591 Generalized enlarged lymph nodes: Secondary | ICD-10-CM

## 2019-03-11 HISTORY — PX: THORACENTESIS: IMG2648

## 2019-03-11 LAB — COMPREHENSIVE METABOLIC PANEL
ALT: 25 U/L (ref 0–55)
AST (SGOT): 20 U/L (ref 5–34)
Albumin/Globulin Ratio: 0.8 — ABNORMAL LOW (ref 0.9–2.2)
Albumin: 3.3 g/dL — ABNORMAL LOW (ref 3.5–5.0)
Alkaline Phosphatase: 148 U/L — ABNORMAL HIGH (ref 38–106)
Anion Gap: 11 (ref 5.0–15.0)
BUN: 16 mg/dL (ref 9–28)
Bilirubin, Total: 0.6 mg/dL (ref 0.2–1.2)
CO2: 25 mEq/L (ref 22–29)
Calcium: 9.9 mg/dL (ref 8.5–10.5)
Chloride: 99 mEq/L — ABNORMAL LOW (ref 100–111)
Creatinine: 0.9 mg/dL (ref 0.7–1.3)
Globulin: 4.2 g/dL — ABNORMAL HIGH (ref 2.0–3.6)
Glucose: 126 mg/dL — ABNORMAL HIGH (ref 70–100)
Potassium: 5.4 mEq/L — ABNORMAL HIGH (ref 3.5–5.1)
Protein, Total: 7.5 g/dL (ref 6.0–8.3)
Sodium: 135 mEq/L — ABNORMAL LOW (ref 136–145)

## 2019-03-11 LAB — B-TYPE NATRIURETIC PEPTIDE: B-Natriuretic Peptide: 53 pg/mL (ref 0–100)

## 2019-03-11 LAB — GFR: EGFR: >60

## 2019-03-11 LAB — TROPONIN I: Troponin I: <0.01 ng/mL (ref 0.00–0.05)

## 2019-03-11 LAB — MAGNESIUM: Magnesium: 1.8 mg/dL (ref 1.6–2.6)

## 2019-03-11 LAB — PHOSPHORUS: Phosphorus: 3.3 mg/dL (ref 2.3–4.7)

## 2019-03-11 LAB — CBC
Absolute NRBC: 0 10*3/uL (ref 0.00–0.00)
Hematocrit: 39.4 % (ref 37.6–49.6)
Hgb: 13.1 g/dL (ref 12.5–17.1)
MCH: 29.7 pg (ref 25.1–33.5)
MCHC: 33.2 g/dL (ref 31.5–35.8)
MCV: 89.3 fL (ref 78.0–96.0)
MPV: 9.4 fL (ref 8.9–12.5)
Nucleated RBC: 0 /100 WBC (ref 0.0–0.0)
Platelets: 556 10*3/uL — ABNORMAL HIGH (ref 142–346)
RBC: 4.41 10*6/uL (ref 4.20–5.90)
RDW: 15 % (ref 11–15)
WBC: 9.61 10*3/uL — ABNORMAL HIGH (ref 3.10–9.50)

## 2019-03-11 LAB — PT/INR
PT INR: 1 (ref 0.9–1.1)
PT: 13.1 s (ref 12.6–15.0)

## 2019-03-11 SURGERY — THORACENTESIS
Anesthesia: Local | Laterality: Right

## 2019-03-11 MED ORDER — SERTRALINE HCL 50 MG PO TABS
25.00 mg | ORAL_TABLET | Freq: Once | ORAL | Status: AC
Start: 2019-03-11 — End: 2019-03-11
  Administered 2019-03-11: 15:00:00 25 mg via ORAL
  Filled 2019-03-11: qty 1

## 2019-03-11 MED ORDER — HYDROCODONE-ACETAMINOPHEN 5-325 MG PO TABS
1.00 | ORAL_TABLET | Freq: Three times a day (TID) | ORAL | Status: AC
Start: 2019-03-11 — End: 2019-03-12
  Administered 2019-03-11 – 2019-03-12 (×3): 1 via ORAL
  Filled 2019-03-11 (×3): qty 1

## 2019-03-11 MED ORDER — SODIUM POLYSTYRENE SULFONATE 15 GM/60ML PO SUSP
15.00 g | Freq: Once | ORAL | Status: AC
Start: 2019-03-11 — End: 2019-03-11
  Administered 2019-03-11: 10:00:00 15 g via ORAL
  Filled 2019-03-11: qty 60

## 2019-03-11 MED ORDER — SERTRALINE HCL 50 MG PO TABS
50.0000 mg | ORAL_TABLET | Freq: Every day | ORAL | Status: DC
Start: 2019-03-12 — End: 2019-03-18
  Administered 2019-03-12 – 2019-03-16 (×5): 50 mg via ORAL
  Filled 2019-03-11 (×7): qty 1

## 2019-03-11 MED ORDER — SODIUM POLYSTYRENE SULFONATE 15 GM/60ML PO SUSP
15.00 g | Freq: Once | ORAL | Status: DC
Start: 2019-03-11 — End: 2019-03-11

## 2019-03-11 MED ORDER — BISMUTH SUBSALICYLATE 262 MG/15ML PO SUSP
30.00 mL | Freq: Two times a day (BID) | ORAL | Status: DC | PRN
Start: 2019-03-11 — End: 2019-03-20
  Filled 2019-03-11: qty 236
  Filled 2019-03-11: qty 120

## 2019-03-11 NOTE — Rehab Progress Note ([facility]) (Signed)
Jonathan [name]  MRN: [id number]  Account: [id number]  Session Start: 03/11/2019 12:00:00 AM  Session Stop: 03/11/2019 12:00:00 AM    Total Treatment Minutes:  Minutes    Rehabilitation Nursing  Inpatient Rehabilitation Shift Assessment    Rehab Diagnosis: TBI  Demographics:            Age: 57Y            Gender: Male  Primary Language: English    Date of Onset:  02/22/19  Date of Admission: 03/06/2019 12:57:06 PM    Rehabilitation Precautions Restrictions:   Bilateral lower extremities: WBAT, Risk for falls. Seizure precaution, skin  integrity    Patient Report: I need to brush my teeth  Patient/Caregiver Goals:  To go back to my normal state, doing my own things    Wounds/Incisions: No wounds or incisions.    Medication Review: No clinically significant medication issues identified this  shift.    Bowel and Bladder Output:                       Bladder (# only)             Bowel (# only)  Number of Episodes  Continent            2                            0  Incontinent          0                            0    Education Provided:    Education Provided: Precautions. Safety issues and interventions. Supervision  requirements. Fall protocol. Safety. Pain management. Medication options. Side  effects.       Audience: Patient.       Mode: Explanation.       Response: Needs reinforcement.    Long Term Goals: 1. Patient will  be able to participate in therapy with pain  level 2/10 prior to medicated for pain 100% by d/c   2. Patient will recognize limitations and call for assiaatance before  attempting mobility 100% of the time in order to prevent falls at home - Goal  Not Met   3. Systolic blood pressure will be at or below 150 via medications, diet,and  improved activity level 100% by d/c  Two weeks  Short Term Goals: 1. Patient will  be able to participate in therapy with pain  level 2/10 prior to medicated for pain 50% by d/c - Goal Not Met   2. Patient will recognize limitations and call for assiaatance  before  attempting mobility 50% of the time in order to prevent falls at home - Goal Not  Met   3. Systolic blood pressure will be at or below 150 via medications, diet,and  improved activity level 50% by d/c - Goal Not Met  One week    PROGRESS TOWARD GOALS: Risk of falls/injuries.  Intervention(s): Teach patient/ family to evaluate situation and environment for  hazards, Use of chair and/or bed alarms, Make sure call light, personal  belongings within the patient?s reach, Provide distraction, direct attention to  other activities, Frequent safety check, rounding, Use of non-skid footwear,  Maintain adequate lighting    Response to intervention(s): Patient is impulsive and non-compliant does not use  the call light  Ongoing Education/Training: Continue with AVS and repmind patient to always use  the call button    PLAN:  Rehab Nursing Specific Interventions:  Continue with the current Nursing Plan of Care.    TEAM CARE PLAN  Identified problems from team documentation:  Problem: Impaired Cardiac Function  Cardiac: Primary Team Goal: Systolic blood pressure will be at or below 150 via  medications, diet,and improved activity level 100% by d/c/    Problem: Impaired Cognition  Cognition: Primary Team Goal: Pt will utilize internal/external compensatory  memory strategies to support new learning of precautions, medical management, as  well as brain injury education w/ only min-mod verbal cues from  staff/caregiver./Active    Problem: Impaired Mobility  Mobility: Primary Team Goal: Pt will be modI for bed mobility, and ambulation  with LRAD and SPV for stairs to return home safely./Active    Problem: Impaired Pain Management  Pain Mgmt: Primary Team Goal: Patient will  be able to participate in therapy  with pain level 2/10 prior to medicated for pain 100% by d/c/    Problem: Impaired Psychosocial Skills/Behavior  PsychoSocial: Primary Team Goal: Patient will regularly employ adaptive coping  skills for adjusting to  medical illness, reduced function, and rehab  hospitalization via maintaining adherence with his rehab plan of care, and  expressing positive expectations for increasing his overall future functional  independence./    Problem: Impaired Self-care Mgmt/ADL/IADL  Self Care: Primary Team Goal: Pt's caregiver will provide recommended  environment, and supervision in order for pt to complete self are tasks at mod  I/supervision level and return home safely./Active    Problem: Safety Risk and Restraint  Safety: Primary Team Goal: Patient will recognize limitations and call for  assiaatance before attempting mobility 100% of the time in order to prevent  falls at home/    Please review Integrated Patient View Care Plan Flowsheet for Team identified  Problems, Interventions, and Goals.    Signed by: Samule Dry, RN 03/11/2019 1:44:00 AM

## 2019-03-11 NOTE — Rehab Progress Note ([facility]) (Signed)
Jonathan [name]  MRN: [id number]  Account: [id number]  Session Start: 03/11/2019 10:00:00 AM  Session Stop: 03/11/2019 11:00:00 AM    Total Treatment Minutes: 60.00 Minutes    Speech Language Pathology  Inpatient Rehabilitation Group Note    Rehab Diagnosis: TBI  Demographics:            Age: 33Y            Gender: Male  Rehabilitation Precautions/Restrictions:   Bilateral lower extremities: WBAT, Risk for falls. Seizure precaution, skin  integrity    SUBJECTIVE  Patient Report: "What time is it?"  Patient/Caregiver Goals:  To go back to my normal state, doing my own things  Pain: Patient currently without complaints of pain.  Pain Reassessment: Pain was not reassessed as no pain was reported.    OBJECTIVE  Number of Patients Participating in Group: 2  Number of Licensed Providers Involved in the Group: 1    Speech Group Category:   Cognition  Speech Group Comments: Pt aroused at bedside and insisting on independent  ambulation to treatment area despite marked drowsiness. Per RN, received  narcotic in am which likely contributed to his lethargy. Arriving in [facility] dayroom,  pt asking "have I worked with you before?" Prompted to provide self-description  and reason for hospital admission, successful in retrieving correct superficial  responses as well as some details pertaining to TBI acquisition. Required extra  time for answering. Recognized cognitive changes in primary area of memory w/o  support though somewhat downplayed effects on new learning and could not provide  any compensatory methods. Receptive to education on external/internal  strategies. Produced relevant example of association for SLP name independently.  Endorsed benefit of phone for calendar and reminders while supplying  personalized examples. Sought out relevant device for tracking time 1X ("ah,  where's my watch?").    Speech Group Targeted Impairments: The group interventions addressed the  following impairments: Decreased verbal memory.  Speech  Group Functional Limitations: By addressing these impairments in a  structured group setting, the following functional limitations were addressed:  Functional memory for daily activities.  Patient was returned to or left in bed. Handoff to nurse completed.  Bed alarm in place and activated.  Oriented to call bell and placed within reach.  Personal items within reach.  Education Provided:    Education Provided: Cognitive functioning. Compensatory cognitive  strategies/aids       Audience: Patient.       Mode: Explanation.       Response: Verbalized understanding.    3 Hour Rule Minutes: 60 minutes of SLP treatment this session count towards  intensity and duration of therapy requirement. Patient was seen for the full  scheduled time of SLP treatment this session.  Therapy Mode Minutes: Individual: 60 minutes.    Signed by: Trenda Moots, M.A., CCC/SLP 03/11/2019 11:00:00 AM

## 2019-03-11 NOTE — Progress Notes (Signed)
Order received for  medical procedure this pm,  and npo since breakfast. Attempted  placing  IV gauge # 22 x3  unsuccessful .Wife visiting  and aware of the above. Reported to  RN  in [facility] proir to pt leaving the unit.

## 2019-03-11 NOTE — Rehab Progress Note ([facility]) (Signed)
NAMEBOWYN MERCIER  MRN: [id number]  Account: [id number]  Session Start: 03/11/2019 11:00:00 AM  Session Stop: 03/11/2019 12:00:00 PM    Total Treatment Minutes: 60.00 Minutes    Occupational Therapy  Inpatient Rehabilitation Treatment Note    Rehab Diagnosis: TBI  Demographics:            Age: 47Y            Gender: Male  Rehabilitation Precautions/Restrictions:   Bilateral lower extremities: WBAT, Risk for falls. Seizure precaution, skin  integrity    Vital Signs:                       Before Activity              After Activity  Vitals  Position/Activity    seated                       -  BP Systolic          107                          -  BP Diastolic         68                           -  Pulse                60                           -    Interventions:       Therapeutic Activities:  Pt declined to have kinesiotape applied to ribs  for rib pain. OT administered Medi Cog to assess medication management skills.  Pt rushed and made unnecessary errors, he self-corrected 4/5 errors. OT engaged  him in a multi-step reading and writing activity to assess his  problem-solving/information seeking skills. He again rushed and made several  transcription errors. He used non-standard abbreviations and was not able to  recall what the relationship of the abbreviations  by the end of the activity.  Ambulated w/o AD > 250 ft with slow, wide-based gait. He struggled with route  finding and often could not find signs provided as cues.  Patient was returned to or left in bed. Handoff to nurse completed.  Bed alarm in place and activated.  Oriented to call bell and placed within reach.  Personal items within reach.    ASSESSMENT  Pt continue to exhibit impulsivity and reduced insight into his current  deficits. He benefited from a mask to assist with near point copying and place  keeping when reading. He would benefit from additional practice with strategies  for carryover. His hyper-verbosity also impacts his processing. It is  helpful to  minimize his discourse when he is route finding.    3 Hour Rule Minutes: 60 minutes of OT treatment this session count towards  intensity and duration of therapy requirement. Patient was seen for the full  scheduled time of OT treatment this session.  Therapy Mode Minutes: Individual: 60 minutes.    Signed by: Hermina Staggers, OTR/L 03/11/2019 12:00:00 PM

## 2019-03-11 NOTE — Nursing Progress Note (Signed)
Labs drawn and Hospitalist in to see pt. Hospitalist states pt is without c/o at this time. Results of labs to be available ASAP. Pt going down for CT of chest per Nursing staff.

## 2019-03-11 NOTE — Telephone Encounter (Signed)
Received text from Dr. Thelma Barge who stated "spoke to the pt and his wife"    Cancelled appointment per. Dr. Thelma Barge

## 2019-03-11 NOTE — OR Nursing (Signed)
400 cc of serosanguinous Pleural fluid drained from RLL. Fluid discarded.  Pt tolerated procedure well and without complications. Call Rn for report, diet to be resumed.

## 2019-03-11 NOTE — Rehab Progress Note ([facility]) (Signed)
Jonathan [name]  MRN: [id number]  Account: [id number]  Session Start: 03/11/2019 1:00:00 PM  Session Stop: 03/11/2019 1:30:00 PM    Total Treatment Minutes: 30.00 Minutes    Physical Therapy  Inpatient Rehabilitation Treatment Note    Rehab Diagnosis: TBI  Demographics:            Age: 34Y            Gender: Male  Rehabilitation Precautions/Restrictions:   Bilateral lower extremities: WBAT, Risk for falls. Seizure precaution, skin  integrity    OBJECTIVE  Vital Signs:      Interventions:       Therapeutic Activities:  Pt lying in bed with RN administering IV. Session  cut short 2/2 to patient leaving for procedure. Pt performed bed mobility with  including scooting and rolling for proper positioning in bed with S. In standing  without AD and CGA pt performed marching, WS 3x10 each. Pt ambulated with RW and  CGA around nurses station x168ft, x50 ft. Verbal cues for increased step length,  awareness of surroundings and staying within the RW for safety. Pt performed  lateral side steps with BUE on HR x10 ft 3 rounds. In standing pt performed  floor clocks B upon command of direction with RW and CGA. Pt with difficulty  stepping forward with RLE. Pt returned to bed and doffed shirt and shorts with S  and donned gown with minA.  Patient was returned to or left in bed. Handoff to nurse completed.  Bed placed in lowest position.    ASSESSMENT  Pt continues to demonstrate poor awareness and impulsivity throuhgout session.  Pt resonded to verbal cues for with brief improvements in performeance, however,  carry over did not remain following short period. Pt had one instance of LOB in  posterior direction while standing and marching. Pt will continue to benefit  from skilled PT to address current deficits and allow safe [place].    3 Hour Rule Minutes: 30 minutes of PT treatment this session count towards  intensity and duration of therapy requirement. Patient was not seen for the full  scheduled time of PT treatment this session.    Pt left for procedure  Will attempt to see patient tomorrow,  as scheduled  Therapy Mode Minutes: Individual: 30 minutes.    Signed by: Erasmo Downer, PT 03/11/2019 1:30:00 PM

## 2019-03-11 NOTE — Progress Notes (Signed)
Pt returned from thorac via bed with wife by his side with minimal disc voiced, Norco 1 tab scheduled  given , and no drainage noted at right lateral area . Resting comfortably  in bed after spouse left .Will  report to the night shift to follow up.

## 2019-03-11 NOTE — Rehab Progress Note ([facility]) (Signed)
Jonathan [name]  MRN: [id number]  Account: [id number]  Session Start: 03/11/2019 12:00:00 AM  Session Stop: 03/11/2019 12:00:00 AM    Total Treatment Minutes:  Minutes    Rehabilitation Nursing  Inpatient Rehabilitation Shift Assessment    Rehab Diagnosis: TBI  Demographics:            Age: 42Y            Gender: Male  Primary Language: English    Date of Onset:  02/22/19  Date of Admission: 03/06/2019 12:57:06 PM    Rehabilitation Precautions Restrictions:   Bilateral lower extremities: WBAT, Risk for falls. Seizure precaution, skin  integrity    Patient Report: "i'm ready to get out of here"  Patient/Caregiver Goals:  To go back to my normal state, doing my own things    Wounds/Incisions: No wounds or incisions.    Medication Review: No clinically significant medication issues identified this  shift.    Bowel and Bladder Output:                       Bladder (# only)             Bowel (# only)  Number of Episodes  Continent            1                            0  Incontinent          0                            0    Education Provided:    Education Provided: Safety issues and interventions. Fall protocol. Impulsivity.  Supervision requirements. Medication. Name and dosage. Administration. Purpose.  Side Effects. Interaction.  Brain Injury: What is brain injury, Signs /T/ Symptoms associated with brain  injury, Confusion, Seizures, Supervision/assistance requirements Fall  prevention/balance training.       Audience: Patient.       Mode: Explanation.       Response: Needs reinforcement.    Long Term Goals: 1. Patient will  be able to participate in therapy with pain  level 2/10 prior to medicated for pain 100% by d/c   2. Patient will recognize limitations and call for assiaatance before  attempting mobility 100% of the time in order to prevent falls at home - Goal  Not Met   3. Systolic blood pressure will be at or below 150 via medications, diet,and  improved activity level 100% by d/c  Two weeks  Short Term  Goals: 1. Patient will  be able to participate in therapy with pain  level 2/10 prior to medicated for pain 50% by d/c - Goal Not Met   2. Patient will recognize limitations and call for assiaatance before  attempting mobility 50% of the time in order to prevent falls at home - Goal Not  Met   3. Systolic blood pressure will be at or below 150 via medications, diet,and  improved activity level 50% by d/c - Goal Not Met  One week    PROGRESS TOWARD GOALS: Pain management.  Interventions: Provide health teachings on pain monitoring using numeric scale,  Explain medications ordered for pain management, Evaluate, assess outcomes,  and/or effectiveness of pain management, Assist patient to identify other/or  non- pharmacologic measures to alleviate pain (e.g., positioning, ROM exercises,  splinting, slow stretching exercises)    Response to intervention(s): patient stated his pain level decreased    Ongoing Education/Training: non-pharmaceutical techniques to help distract from  pain, continue with pain medication when necessary.    PLAN:  Rehab Nursing Specific Interventions:  Continue with the current Nursing Plan of Care.    TEAM CARE PLAN  Identified problems from team documentation:  Problem: Impaired Cardiac Function  Cardiac: Primary Team Goal: Systolic blood pressure will be at or below 150 via  medications, diet,and improved activity level 100% by d/c/    Problem: Impaired Cognition  Cognition: Primary Team Goal: Pt will utilize internal/external compensatory  memory strategies to support new learning of precautions, medical management, as  well as brain injury education w/ only min-mod verbal cues from  staff/caregiver./Active    Problem: Impaired Mobility  Mobility: Primary Team Goal: Pt will be modI for bed mobility, and ambulation  with LRAD and SPV for stairs to return home safely./Active    Problem: Impaired Pain Management  Pain Mgmt: Primary Team Goal: Patient will  be able to participate in  therapy  with pain level 2/10 prior to medicated for pain 100% by d/c/    Problem: Impaired Psychosocial Skills/Behavior  PsychoSocial: Primary Team Goal: Patient will regularly employ adaptive coping  skills for adjusting to medical illness, reduced function, and rehab  hospitalization via maintaining adherence with his rehab plan of care, and  expressing positive expectations for increasing his overall future functional  independence./    Problem: Impaired Self-care Mgmt/ADL/IADL  Self Care: Primary Team Goal: Pt's caregiver will provide recommended  environment, and supervision in order for pt to complete self are tasks at mod  I/supervision level and return home safely./Active    Problem: Safety Risk and Restraint  Safety: Primary Team Goal: Patient will recognize limitations and call for  assiaatance before attempting mobility 100% of the time in order to prevent  falls at home/    Please review Integrated Patient View Care Plan Flowsheet for Team identified  Problems, Interventions, and Goals.    Signed by: Lestine Box, RN 03/11/2019 5:14:00 PM
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
## 2019-03-11 NOTE — Brief Op Note (Signed)
[facility]  [facility]   Brief Op Note     Physician(s): Denna Haggard, MD    Assistant(s): None    Pre-operative Diagnosis: Pleural effusion right    Post-operative Diagnosis: Diagnosis is same as pre-operative diagnosis    Procedure(s) Performed: Ultrasound guided right thoracentesis                                                                                                                                                                                                                 Anesthesia:   1% lidocaine local    Complications: None    Estimated Blood Loss:  None    Blood Administered:  None    Fluid Aministered:  Per Nursing    Tubes and Drains: None    Specimens: Pleural fluid as ordered    Findings: There is a moderate amount of fluid present. 400 cc of serosanguinous removed.    Patient was transferred from the procedure room to the nursing unit in stable condition. Procedure note dictated.    Signed by: Denna Haggard, MD  [facility] Department  [facility] [phone]  [facility] [phone]
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
## 2019-03-11 NOTE — Progress Notes (Signed)
Pt c/o chest pain, on-call MD paged, EKG done, MD made aware of result and told writer to call Hosptalist to see pt. Hospitalist ordered STAT labs (see epic for orders). Hospitalist came in to see pt and ordered CT chest WO contract. Pt taken for CT by the charge nurse. Pt is still impulsive and does not use his call button before getting out of bed, continues on AVS monitor.

## 2019-03-11 NOTE — Nursing Progress Note (Signed)
Hospitalist called and asked for STAT lab results. Labs not drawn as of yet. Called the lab and spoke with Ebony Cargo. Made him aware that labs are STAT and need to drawn NOW please. Hospitalist would be calling back in 15 mins to find out what happened. Hospitalist further stated she has seen the EKG and is in route to see pt and would like results prior to seeing pt for episode of multiple impulsive trips out of bed due to "Burning in chest." EKG was obtained and placed on pt's chart for Hospitalist to see.

## 2019-03-12 ENCOUNTER — Inpatient Hospital Stay (HOSPITAL_COMMUNITY): Payer: Medicare Other

## 2019-03-12 ENCOUNTER — Encounter: Payer: Self-pay | Admitting: Diagnostic Radiology

## 2019-03-12 ENCOUNTER — Telehealth (INDEPENDENT_AMBULATORY_CARE_PROVIDER_SITE_OTHER): Payer: Medicare Other | Admitting: Internal Medicine

## 2019-03-12 DIAGNOSIS — S069X9A Unspecified intracranial injury with loss of consciousness of unspecified duration, initial encounter: Secondary | ICD-10-CM

## 2019-03-12 DIAGNOSIS — S069X1D Unspecified intracranial injury with loss of consciousness of 30 minutes or less, subsequent encounter: Secondary | ICD-10-CM

## 2019-03-12 LAB — CBC
Absolute NRBC: 0 10*3/uL (ref 0.00–0.00)
Hematocrit: 37.4 % — ABNORMAL LOW (ref 37.6–49.6)
Hgb: 12.5 g/dL (ref 12.5–17.1)
MCH: 30.1 pg (ref 25.1–33.5)
MCHC: 33.4 g/dL (ref 31.5–35.8)
MCV: 90.1 fL (ref 78.0–96.0)
MPV: 9 fL (ref 8.9–12.5)
Nucleated RBC: 0 /100 WBC (ref 0.0–0.0)
Platelets: 588 10*3/uL — ABNORMAL HIGH (ref 142–346)
RBC: 4.15 10*6/uL — ABNORMAL LOW (ref 4.20–5.90)
RDW: 15 % (ref 11–15)
WBC: 7.85 10*3/uL (ref 3.10–9.50)

## 2019-03-12 LAB — BASIC METABOLIC PANEL
Anion Gap: 10 (ref 5.0–15.0)
BUN: 18 mg/dL (ref 9–28)
CO2: 26 mEq/L (ref 22–29)
Calcium: 9.9 mg/dL (ref 8.5–10.5)
Chloride: 99 mEq/L — ABNORMAL LOW (ref 100–111)
Creatinine: 0.8 mg/dL (ref 0.7–1.3)
Glucose: 119 mg/dL — ABNORMAL HIGH (ref 70–100)
Potassium: 4.8 mEq/L (ref 3.5–5.1)
Sodium: 135 mEq/L — ABNORMAL LOW (ref 136–145)

## 2019-03-12 LAB — PT/INR
PT INR: 1.1 (ref 0.9–1.1)
PT: 13.7 s (ref 12.6–15.0)

## 2019-03-12 LAB — GFR: EGFR: >60
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
## 2019-03-12 NOTE — Rehab Progress Note ([facility]) (Signed)
Jonathan [name]  MRN: [id number]  Account: [id number]  Session Start: 03/12/2019 11:00:00 AM  Session Stop: 03/12/2019 12:00:00 PM    Total Treatment Minutes:  Minutes    Therapeutic Recreation  Inpatient Rehabilitation Progress Note    Rehab Diagnosis: TBI  Demographics:            Age: 72Y            Gender: Male  Rehabilitation Precautions/Restrictions:   Bilateral lower extremities: WBAT, Risk for falls. Seizure precaution, skin  integrity    SUBJECTIVE  Patient Reports: "I found this fun and challenging."  Pain: Patient currently without complaints of pain.    OBJECTIVE    Interventions: The following treatment was provided:  Avocational Activity-Cognitive:  Pt received in room, agreable to session with  encouargment. Safety cues needed when transfering to w/c, Engaged in trivia game  in dayroom setting with focus on cognitive stimulation.  Pain Reassessment: Pain was not reassessed as no pain was reported.    Education Provided:    Education Provided: Cognitive functioning. Benefit and value of leisure  activity.       Audience: Patient.       Mode: Explanation.  Demonstration.       Response: Applied knowledge.  Needs practice.  Needs reinforcement.    ASSESSMENT  Pt engaged in task with with mod cues for turn taking and game rules. Pt  significantly limited by memory deficits. Pt expressed enjoyment of task, good  participation.    PLAN  Therapeutic Recreation services are recommended to address: cognition, mobility,  safety, endurance, education, socialization    Signed by: Pearson Forster, CTRS 03/12/2019 12:00:00 PM

## 2019-03-12 NOTE — Rehab Progress Note ([facility]) (Signed)
Jonathan [name]  MRN: [id number]  Account: [id number]  Session Start: 03/12/2019 1:00:00 PM  Session Stop: 03/12/2019 2:00:00 PM    Total Treatment Minutes: 60.00 Minutes    Physical Therapy  Inpatient Rehabilitation Progress Note    Rehab Diagnosis: TBI  Demographics:            Age: 72Y            Gender: Male  Rehabilitation Precautions/Restrictions:   Bilateral lower extremities: WBAT, Risk for falls. Seizure precaution, skin  integrity    SUBJECTIVE  Patient Report: Pt reports that he is ready to go home.  Patient/Caregiver Goals:  To go back to my normal state, doing my own things  Pain: Patient currently without complaints of pain.    OBJECTIVE  Vital Signs:                       Before Activity              After Activity  Vitals  Position/Activity    stting                       sitting  BP Systolic          120                          131  BP Diastolic         72                           76  Pulse                60                           56      Interventions:       Therapeutic Activities:  Pt practiced semi-supine to sit EOB with SPV. Pt  practiced SPT from bed<>chair with RW and MinA for steadying due to slight loss  of balance. Pt practiced gait with RW for 50' and CGA. Pt mass practiced balance  in // bars - step taps on 6" block, 4" block without UE support, balance with  foot on 4" step and head turns, lateral tilt board holds/controlled tilts,  lateral step overs, step overs forward/backwards. Pt practiced gait with RW for  150' and CGA.  Patient was returned to or left in bed. Handoff to nurse completed.  Bed alarm in place and activated.  Oriented to call bell and placed within reach.  Personal items within reach.  Assistive devices positioned out of reach.    Pain Reassessment: Pain was not reassessed as no pain was reported.    Lower Extremity Orthosis: Patient does not present with foot drop or ankle  instability and does not need an orthotic.  Equipment Provided: None at this  time.    Education Provided:    Education Provided: Fall prevention/balance training. Gait.       Audience: Patient.       Mode: Explanation.  Demonstration.       Response: Applied knowledge.  Verbalized understanding.  Demonstrated skill.  Needs reinforcement.    ASSESSMENT  Pt continues to demonstrate impulsive behavior and requires constant  redirection. Pt used humor throughout therapy session to diminish his  impairments, and continues to have decreased awareness  of deficits. Pt continues  to demonstrate R side weakness as seen with balance exercises. Pt has tendency  to fall backwards to the R. Pt will continue to benefit from skilled physical  therapy to address current impairments.    CARE Tool  Mobility Functional Assessment  Sit to Lying: 04 - Supervision: Helper provides supervision for safety or verbal  cues ONLY  Assistive Device(s):   None    Lying to Sitting on Side of Bed: 04 - Supervision: Helper provides supervision  for safety or verbal cues ONLY  Assistive Device(s):   None    Sit to Stand: 04 - Touching Assistance: Helper provides  touching/steadying/contact guard  Assistive Device(s):   Rolling walker    Chair/Bed-to-Chair Transfer: 04 - Touching Assistance: Helper provides  touching/steadying/contact guard  Assistive Device(s):   Rolling walker    Walk 10 Feet: 04 - Touching Assistance: Helper provides  touching/steadying/contact guard   RW CGA    Walk 50 Feet with Two Turns: 04 - Touching Assistance: Helper provides  touching/steadying/contact guard   CGA RW    Walk 150 Feet:  04 - Touching Assistance: Helper provides  touching/steadying/contact guard   RW CGA    Long Term Goals: Pt will be modI for bed mobility for decreased caregiver  burden.  Pt will be able to ambulate >300' with LRAD and modI for household ambulation.  Pt will be able to complete 1 FOS with railing and SPV for safety and access to  basement at d/c.  Pt will ambulate outside on uneven surfaces and curbs with SPV for  attending MD  appointments at d/c.  10-14 days from IE on 03/07/2019  Short Term Goals:    Progress Towards Goals:   LONG TERM GOAL REVIEW:       1. Pt will be modI for bed mobility for decreased caregiver burden. - Not  Met: progressing       2. Pt will be able to ambulate >300' with LRAD and modI for household  ambulation. - Not Met progressing       3. Pt will be able to complete 1 FOS with railing and SPV for safety and  access to basement at d/c. - Not Met progressing       4. Pt will ambulate outside on uneven surfaces and curbs with SPV for  attending MD appointments at d/c. - Not Met: progressing  Time frame to achieve long term goal(s):  10-14 days from IE on 03/07/2019    PLAN  Continued Physical Therapy is recommended.  Recommended Frequency/Duration/Intensity: 1-2 hours/day, 5-6 days/week 10-14  days from IE on 03/07/2019  Activities Contributing Toward Care Plan: TE/TA, balance training, gait  activities, neuro-muscular re-ed, stairs, patient/family training, d/c lplaning,  procurement of DME, etc  The patient is not appropriate for group treatment.    Care Plan  Identified problems from team documentation:  Problem: Impaired Cardiac Function  Cardiac: Primary Team Goal: Systolic blood pressure will be at or below 150 via  medications, diet,and improved activity level 100% by d/c/Active    Problem: Impaired Cognition  Cognition: Primary Team Goal: Pt will utilize internal/external compensatory  memory strategies to support new learning of precautions, medical management, as  well as brain injury education w/ only min-mod verbal cues from  staff/caregiver./Active    Problem: Impaired Mobility  Mobility: Primary Team Goal: Pt will be modI for bed mobility, and ambulation  with LRAD and SPV for stairs to return home safely./Active  Problem: Impaired Pain Management  Pain Mgmt: Primary Team Goal: Patient will  be able to participate in therapy  with pain level 2/10 prior to medicated for pain 100% by  d/c/Active    Problem: Impaired Psychosocial Skills/Behavior  PsychoSocial: Primary Team Goal: Patient will regularly employ adaptive coping  skills for adjusting to medical illness, reduced function, and rehab  hospitalization via maintaining adherence with his rehab plan of care, and  expressing positive expectations for increasing his overall future functional  independence./    Problem: Impaired Self-care Mgmt/ADL/IADL  Self Care: Primary Team Goal: Pt's caregiver will provide recommended  environment, and supervision in order for pt to complete self are tasks at mod  I/supervision level and return home safely./Active    Problem: Safety Risk and Restraint  Safety: Primary Team Goal: Patient will recognize limitations and call for  assiaatance before attempting mobility 100% of the time in order to prevent  falls at home/Active    Add/Update Problems from this Treatment:  Update of existing problem:   Mobility: Pt ambulates with RW for 150' and CGA    Discipline:  Physical Therapy    Please review Integrated Patient View Care Plan Flowsheet for Team identified  Problems, Interventions, and Goals.    3 Hour Rule Minutes: 60 minutes of PT treatment this session count towards  intensity and duration of therapy requirement. Patient was seen for the full  scheduled time of PT treatment this session.  Therapy Mode Minutes: Individual: 60 minutes.    Signed by: Lorin Glass, DPT, PT 03/12/2019 2:00:00 PM

## 2019-03-12 NOTE — Rehab Progress Note ([facility]) (Signed)
NAMEABDULLOH [name]  MRN: [id number]  Account: [id number]  Session Start: 03/12/2019 9:00:00 AM  Session Stop: 03/12/2019 10:00:00 AM    Total Treatment Minutes: 60.00 Minutes    Psychology Services  Inpatient Rehabilitation Progress Note    Rehab Diagnosis: TBI  Demographics:            Age: 35Y            Gender: Male    Medications and Allergies: Significant rehabilitation considerations:   NKA  Rehabilitation Precautions/Restrictions:   Bilateral lower extremities: WBAT, Risk for falls. Seizure precaution, skin  integrity      General Observations and Mental Status: [name] was awake, alert, and  cooperative throughout the session. He presented sitting upright in his hospital  bed resting comfortably. Affect was broad and topic congruent over the course of  the evaluation. Eye contact was good. Thought process was linear and focused;  content was appropriate. Speech was of normal volume, rate, and prosody.  Processing speed was within normal limits. There were no behavioral signs of  pain during the assessment. Patient was oriented to person, place, time, and  situation. 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 Mood was mildly anxious. There was no evidence of response  to auditory/visual stimuli or apparent psychosis. Patient denies any suicidal  ideation, intent, or plan.      Education Provided:    Education Provided: Adaptive coping skills, safety precautions, and TBI  education. .       Audience: Patient.       Mode: Explanation.       Response: Verbalized understanding.  Needs reinforcement.    Interventions:   NPSY INDIVID HLTH INTERV INT   NPSY INDIVID HLTH INTERV ADD    ASSESSMENT  Impressions:  Met with patient to provide supportive counseling to address  ongoing adjustment related issues from a cognitive-behavioral perspective.  Session also focused on processing patient?s anxiousness around his upcoming  transition home. Per patient, she is becoming  increasingly more eaget to  transition home in the near future. He went on to share frustrations with his  recommended need for supervision at all times. [name] also expressed his  struggle with adjusting a new normal and his strong desire to regain  independence and not feel "trapped." This clinician acknowledged and reflected  patient?s concerns. Support and encouragement were provided. He was assisted  with processing his acceptance of his current functional limitations and need  for supervision. We also spoke at length about his current safety concerns that  potential benefit of consistent supervision during this  time in his recovery  process. At the close of the session, explored strategies which he might employ  to assist with mood regulation, maintaining his personal resiliency during the  remainder of his rehab stay.    PLAN  Continued Psychology services are recommended to address: Emotional adjustment  to current medical status and recent injury.    Recommendations:  Individual counseling and co-treatment sessions as needed.    Signed by: Lamar Blinks, PhD 03/12/2019 9:00:00 AM

## 2019-03-12 NOTE — Rehab Progress Note ([facility]) (Signed)
Jonathan [name]  MRN: [id number]  Account: [id number]  Session Start: 03/12/2019 12:00:00 AM  Session Stop: 03/12/2019 12:00:00 AM    Total Treatment Minutes:  Minutes    Rehabilitation Nursing  Inpatient Rehabilitation Shift Assessment    Rehab Diagnosis: TBI  Demographics:            Age: 34Y            Gender: Male  Primary Language: English    Date of Onset:  02/22/19  Date of Admission: 03/06/2019 12:57:06 PM    Rehabilitation Precautions Restrictions:   Bilateral lower extremities: WBAT, Risk for falls. Seizure precaution, skin  integrity    Patient Report: ' I have pain on my back'.  Patient/Caregiver Goals:  To go back to my normal state, doing my own things    Wounds/Incisions: No wounds or incisions.    Medication Review: No clinically significant medication issues identified this  shift.    Bowel and Bladder Output:                       Bladder (# only)             Bowel (# only)  Number of Episodes  Continent            3                            0  Incontinent          0                            0    Education Provided:    Education Provided: Pain management. Pain scale. Medication options. Side  effects. Clinical indicators of pain. Bowel and bladder programs. Bladder  training program. Bowel Training program.  Brain Injury: What is brain injury, Types of brain injury, Signs /T/ Symptoms  associated with brain injury, Anatomy/Physiology of the brain, Rancho scale,  Agitation, Confusion, Personality changes, Emotional adjustments/depression,  Behavioral management, Arousal/responsiveness, Orientation, Thrombophlebitis  (DVT), Seizures, Pressure ulcer, Pulmonary complications Medication. Name and  dosage. Administration. Purpose. Side Effects. Interaction. Labs.       Audience: Patient and significant other.       Mode: Explanation.  Teacher, English as a foreign language provided.       Response: Verbalized understanding.  Needs reinforcement.    Long Term Goals: 1. Patient will  be able to participate in therapy with  pain  level 2/10 prior to medicated for pain 100% by d/c   2. Patient will recognize limitations and call for assiaatance before  attempting mobility 100% of the time in order to prevent falls at home - Goal  Not Met   3. Systolic blood pressure will be at or below 150 via medications, diet,and  improved activity level 100% by d/c  Two weeks  Short Term Goals: 1. Patient will  be able to participate in therapy with pain  level 2/10 prior to medicated for pain 50% by d/c - Goal Not Met   2. Patient will recognize limitations and call for assiaatance before  attempting mobility 50% of the time in order to prevent falls at home - Goal Not  Met   3. Systolic blood pressure will be at or below 150 via medications, diet,and  improved activity level 50% by d/c - Goal Not Met  One week    PROGRESS  TOWARD GOALS: Pain management.  Interventions: Use of clinical indicators to monitor pain level, Explain  medications ordered for pain management, Determine etiology of pain, Evaluate,  assess outcomes, and/or effectiveness of pain management, Assist patient to  identify other/or non- pharmacologic measures to alleviate pain (e.g.,  positioning, ROM exercises, splinting, slow stretching exercises)    Response to intervention(s): Pt verbalized minimal pain relief with as needed  meds.    Ongoing Education/Training: Continue measures  above to relieve discomfort while  in rehab.     Risk of falls/injuries.  Intervention(s): Use of chair and/or bed alarms, Make sure call light, personal  belongings within the patient?s reach, Recognize, assist, and anticipate basic  needs, Establish and/or assist with regular toileting program, Frequent safety  check, rounding, Use of non-skid footwear    Response to intervention(s): Pt has AVS  for safety  and unplugged it from the  wall this pm.    Ongoing Education/Training: Safety education and training performed at all  times to prevent falls while in rehab.    PLAN:  Rehab Nursing Specific  Interventions:  Continue with the current Nursing Plan of Care.    TEAM CARE PLAN  Identified problems from team documentation:  Problem: Impaired Cardiac Function  Cardiac: Primary Team Goal: Systolic blood pressure will be at or below 150 via  medications, diet,and improved activity level 100% by d/c/Active    Problem: Impaired Cognition  Cognition: Primary Team Goal: Pt will utilize internal/external compensatory  memory strategies to support new learning of precautions, medical management, as  well as brain injury education w/ only min-mod verbal cues from  staff/caregiver./Active    Problem: Impaired Mobility  Mobility: Primary Team Goal: Pt will be modI for bed mobility, and ambulation  with LRAD and SPV for stairs to return home safely./Active    Problem: Impaired Pain Management  Pain Mgmt: Primary Team Goal: Patient will  be able to participate in therapy  with pain level 2/10 prior to medicated for pain 100% by d/c/Active    Problem: Impaired Psychosocial Skills/Behavior  PsychoSocial: Primary Team Goal: Patient will regularly employ adaptive coping  skills for adjusting to medical illness, reduced function, and rehab  hospitalization via maintaining adherence with his rehab plan of care, and  expressing positive expectations for increasing his overall future functional  independence./    Problem: Impaired Self-care Mgmt/ADL/IADL  Self Care: Primary Team Goal: Pt's caregiver will provide recommended  environment, and supervision in order for pt to complete self are tasks at mod  I/supervision level and return home safely./Active    Problem: Safety Risk and Restraint  Safety: Primary Team Goal: Patient will recognize limitations and call for  assiaatance before attempting mobility 100% of the time in order to prevent  falls at home/Active    Please review Integrated Patient View Care Plan Flowsheet for Team identified  Problems, Interventions, and Goals.    Signed by: Lestine Box, RN 03/12/2019 5:58:00  PM
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
## 2019-03-12 NOTE — Rehab Progress Note ([facility]il[facility]) (Signed)
Jonathan [name]  MRN: [id number]  Account: [id number]  Session Start: 03/12/2019 2:00:00 PM  Session Stop: 03/12/2019 3:00:00 PM    Total Treatment Minutes: 60.00 Minutes    Speech Language Pathology  Inpatient Rehabilitation Progress Note    Rehab Diagnosis: TBI  Demographics:            Age: 36Y            Gender: Male  Rehabilitation Precautions/Restrictions:   Bilateral lower extremities: WBAT, Risk for falls. Seizure precaution, skin  integrity    SUBJECTIVE  Patient Report: "Ok, I'll use the walker."  Patient/Caregiver Goals:  To go back to my normal state, doing my own things  Pain: Patient currently without complaints of pain.    OBJECTIVE    Interventions:       Speech Treatment: Treatment targeting attention, problem solving, planning  and recall.       Speech Treatment: Reinforced safety benefits of utilizing RW to/from cog  suite. Needed mod reminders for directions and navigation d/t frequent  inattention. Attempted folding RW though struggling w/ collapsing equipment and  refusing A from SLP; upon second attempt after delay, adept in accomplishing  folding independently. Requesting toileting and able to void w/ min cues for  safety. Education given on limiting multi-tasking behavior particularly w/  environmental ambulation (e.g., carrying on conversations while mindfully  walking and adhering to fall safety guidelines). Generated d/c plan w/ focus on  desired realistic tasks to engage in upon return home. Pt listing servicing for  car and boat. Given prompts, able to verbalize appropriate modifications for  inclusion in activity (e.g., contacting mechanic over phone, making payment  remotely). Generated list of personally-relevant memory strategies including  to-do lists, smartphone/smartwatch, as well as taking notes. Retrieved  previously used to-do lists on phone w/ min cues for locating.  Patient was returned to or left in bed. Handoff to nurse completed.  Bed alarm in place and  activated.  Oriented to call bell and placed within reach.  Personal items within reach.  Pain Reassessment: Pain was not reassessed as no pain was reported.    Education Provided:    Education Provided: Cognitive functioning. Compensatory cognitive  strategies/aids       Audience: Patient and Caregiver       Mode: Explanation.       Response: Verbalized understanding.    ASSESSMENT  Gradual progress this week towards AR goals involving cognitive-linguistic  retraining. Beginning to exhibit more breakdowns in attention particularly in  opps for multi-tasking as pt becomes distracted w/ performing tasks while  engaging in conversations. Continues w/ masking of symptoms through use of humor  though becoming more aware of such coping mechanism and slowly more accepting of  his need for A. Stimulable for memory strategies infrequently and will benefit  from reinforcement of those which were highly familiar to him before injury  onset (e.g., to-do lists).    Long Term Goals: Pt will utilize internal/external compensatory memory  strategies to support new learning of precautions, medical management, as well  as brain injury education w/ only min-mod verbal cues from staff/caregiver.  7-10 days from IE  Short Term Goals:    Progress Toward Goals:     LONG TERM GOAL REVIEW:       1. Pt will utilize internal/external compensatory memory strategies to  support new learning of precautions, medical management, as well as brain injury  education w/ only min-mod verbal cues from staff/caregiver. - Not Met:  progressing though needing mod cues at times to actually employ strategies vs  relying on individual memory       New Goal 2. Pt will generate to-do list for home w/ at least 1 relevant  manner of modifying each task for adjustment to brain injury deficits given only  incidental verbal cues.  Time frame to achieve long term goal(s):  7-10 days from IE    PLAN  Continued Speech Language Pathology is recommended to  address:  Recommended Frequency/Duration/Intensity: 60-120 mins of skilled SLP services  5-6 days/week for 7-10 days  Continued Activities Contributing Toward Care Plan: cognitive-linguistic  retraining, compensatory strategy acquisition, brain injury education, and d/c  planning  The patient is appropriate for group treatment. Patient will benefit from group  therapy to practice and carry over skills learned in individual sessions with  new partners, clinicians and contexts    Care Plan  Identified problems from team documentation:  Problem: Impaired Cardiac Function  Cardiac: Primary Team Goal: Systolic blood pressure will be at or below 150 via  medications, diet,and improved activity level 100% by d/c/Active    Problem: Impaired Cognition  Cognition: Primary Team Goal: Pt will utilize internal/external compensatory  memory strategies to support new learning of precautions, medical management, as  well as brain injury education w/ only min-mod verbal cues from  staff/caregiver./Active    Problem: Impaired Mobility  Mobility: Primary Team Goal: Pt will be modI for bed mobility, and ambulation  with LRAD and SPV for stairs to return home safely./Active    Problem: Impaired Pain Management  Pain Mgmt: Primary Team Goal: Patient will  be able to participate in therapy  with pain level 2/10 prior to medicated for pain 100% by d/c/Active    Problem: Impaired Psychosocial Skills/Behavior  PsychoSocial: Primary Team Goal: Patient will regularly employ adaptive coping  skills for adjusting to medical illness, reduced function, and rehab  hospitalization via maintaining adherence with his rehab plan of care, and  expressing positive expectations for increasing his overall future functional  independence./    Problem: Impaired Self-care Mgmt/ADL/IADL  Self Care: Primary Team Goal: Pt's caregiver will provide recommended  environment, and supervision in order for pt to complete self are tasks at mod  I/supervision level and  return home safely./Active    Problem: Safety Risk and Restraint  Safety: Primary Team Goal: Patient will recognize limitations and call for  assiaatance before attempting mobility 100% of the time in order to prevent  falls at home/Active    Add/Update Problems from this Treatment:  Update of existing problem:   Cognition: Still needing significant reinforcement of memory deficit management  w/ seeking out premorbid strategies including note-taking, smartphone, and to-do  lists.    Discipline:  Speech/Language Pathology    Please review Integrated Patient View Care Plan Flowsheet for Team identified  Problems, Interventions, and Goals.    3 Hour Rule Minutes: 60 minutes of SLP treatment this session count towards  intensity and duration of therapy requirement. Patient was seen for the full  scheduled time of SLP treatment this session.  Therapy Mode Minutes: Individual: 60 minutes.    Signed by: Trenda Moots, M.A., CCC/SLP 03/12/2019 3:00:00 PM

## 2019-03-12 NOTE — Rehab Progress Note ([facility]il[facility]) (Signed)
Jonathan [name]  MRN: [id number]  Account: [id number]  Session Start: 03/12/2019 12:00:00 AM  Session Stop: 03/12/2019 12:00:00 AM    Total Treatment Minutes:  Minutes    Rehabilitation Nursing  Inpatient Rehabilitation Shift Assessment    Rehab Diagnosis: TBI  Demographics:            Age: 41Y            Gender: Male  Primary Language: English    Date of Onset:  02/22/19  Date of Admission: 03/06/2019 12:57:06 PM    Rehabilitation Precautions Restrictions:   Bilateral lower extremities: WBAT, Risk for falls. Seizure precaution, skin  integrity    Patient Report: I need some water please  Patient/Caregiver Goals:  To go back to my normal state, doing my own things    Wounds/Incisions: No wounds or incisions.    Medication Review: No clinically significant medication issues identified this  shift.    Bowel and Bladder Output:                       Bladder (# only)             Bowel (# only)  Number of Episodes  Continent            3                            0  Incontinent          0                            0    Education Provided:    Education Provided: Precautions. Safety issues and interventions. Supervision  requirements. Fall protocol. Pain management. Pain scale. Medication options.  Side effects.       Audience: Patient.       Mode: Explanation.       Response: Needs reinforcement.    Long Term Goals: 1. Patient will  be able to participate in therapy with pain  level 2/10 prior to medicated for pain 100% by d/c   2. Patient will recognize limitations and call for assiaatance before  attempting mobility 100% of the time in order to prevent falls at home - Goal  Not Met   3. Systolic blood pressure will be at or below 150 via medications, diet,and  improved activity level 100% by d/c  Two weeks  Short Term Goals: 1. Patient will  be able to participate in therapy with pain  level 2/10 prior to medicated for pain 50% by d/c - Goal Not Met   2. Patient will recognize limitations and call for assiaatance  before  attempting mobility 50% of the time in order to prevent falls at home - Goal Not  Met   3. Systolic blood pressure will be at or below 150 via medications, diet,and  improved activity level 50% by d/c - Goal Not Met  One week    PROGRESS TOWARD GOALS: Risk of falls/injuries.  Intervention(s): Teach patient/ family to evaluate situation and environment for  hazards, Use of chair and/or bed alarms, Make sure call light, personal  belongings within the patient?s reach, Recognize, assist, and anticipate basic  needs, Establish and/or assist with regular toileting program, Provide  distraction, direct attention to other activities, Frequent safety check,  rounding, Use of non-skid footwear, Maintain adequate lighting, Reinforce safety  instruction  for transfer techniques, gait training and use of mobility devices  recommended by PT/OT    Response to intervention(s): Patient is still impulsive gets out of the bed  without calling    Ongoing Education/Training: Continue with AVS monitor and maintain safe  precaution    PLAN:  Rehab Nursing Specific Interventions:  Continue with the current Nursing Plan of Care.    TEAM CARE PLAN  Identified problems from team documentation:  Problem: Impaired Cardiac Function  Cardiac: Primary Team Goal: Systolic blood pressure will be at or below 150 via  medications, diet,and improved activity level 100% by d/c/    Problem: Impaired Cognition  Cognition: Primary Team Goal: Pt will utilize internal/external compensatory  memory strategies to support new learning of precautions, medical management, as  well as brain injury education w/ only min-mod verbal cues from  staff/caregiver./Active    Problem: Impaired Mobility  Mobility: Primary Team Goal: Pt will be modI for bed mobility, and ambulation  with LRAD and SPV for stairs to return home safely./Active    Problem: Impaired Pain Management  Pain Mgmt: Primary Team Goal: Patient will  be able to participate in therapy  with pain  level 2/10 prior to medicated for pain 100% by d/c/    Problem: Impaired Psychosocial Skills/Behavior  PsychoSocial: Primary Team Goal: Patient will regularly employ adaptive coping  skills for adjusting to medical illness, reduced function, and rehab  hospitalization via maintaining adherence with his rehab plan of care, and  expressing positive expectations for increasing his overall future functional  independence./    Problem: Impaired Self-care Mgmt/ADL/IADL  Self Care: Primary Team Goal: Pt's caregiver will provide recommended  environment, and supervision in order for pt to complete self are tasks at mod  I/supervision level and return home safely./Active    Problem: Safety Risk and Restraint  Safety: Primary Team Goal: Patient will recognize limitations and call for  assiaatance before attempting mobility 100% of the time in order to prevent  falls at home/    Please review Integrated Patient View Care Plan Flowsheet for Team identified  Problems, Interventions, and Goals.    Signed by: Samule Dry, RN 03/12/2019 2:04:00 AM

## 2019-03-13 ENCOUNTER — Inpatient Hospital Stay: Payer: Medicare Other

## 2019-03-13 LAB — PT/INR
PT INR: 1 (ref 0.9–1.1)
PT: 13.1 s (ref 12.6–15.0)
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
## 2019-03-13 NOTE — Rehab Progress Note ([facility]) (Signed)
Jonathan [name]  MRN: [id number]  Account: [id number]  Session Start: 03/12/2019 8:00:00 AM  Session Stop: 03/12/2019 9:00:00 AM    Total Treatment Minutes: 60.00 Minutes    Occupational Therapy  Inpatient Rehabilitation Progress Note    Rehab Diagnosis: TBI  Demographics:            Age: 27Y            Gender: Male  Rehabilitation Precautions/Restrictions:   Bilateral lower extremities: WBAT, Risk for falls. Seizure precaution, skin  integrity    SUBJECTIVE  Patient/Caregiver Goals: To go back to my normal state, doing my own things  Pain: Patient currently without complaints of pain.    OBJECTIVE  Vital Signs:                       Before Activity              After Activity  Vitals  BP Systolic          -                            127  BP Diastolic         -                            76  Pulse                -                            60    CARE Tool  Self-Care Functional Assessment  Eating: 06 - Independent without an assistive device    Oral Hygiene: 04 - Touching Assistance: Helper provides  touching/steadying/contact guard  Assistive Device(s):   None  Context for oral hygiene:   Standing    Toileting Hygiene: 03 - Partial/Moderate Assistance: Helper does 1-25% of the  activity  Toileting Equipment:   Nurse, mental health):   Grab bar   assistance for balance during Acupuncturist and hygiene    Shower/Bathe Self: 04 - Touching Assistance: Helper provides  touching/steadying/contact guard  Location:  Shower.  Assistive Device(s):   Grab bar/arm rest to maintain balance, Hand held shower,  shower bench   CGA for balance and near constant verbal cues for safety especially as patient  attempts to stand throughout shower. Patient has one posterior loss of balance  while standing to wash buttocks.    Upper Body Dressing:   04 - Touching Assistance: Helper provides touching/steadying/contact guard  Assistive Device(s):   None   assistance for balance to don shirt while standing    Lower Body Dressing:  04 - Touching Assistance: Helper provides  touching/steadying/contact guard  Assistive Device(s):   None   assistance for balance and near constant vc's for safety    Putting On/Taking Off Footwear: 04 - Supervision: Helper provides supervision  for safety or verbal cues ONLY  Assistive Device(s):   None   reminders to sit while donning socks.    Toilet Transfer: 04 - Touching Assistance: Helper provides  touching/steadying/contact guard  Assistive Device(s):   Grab bars   pivot from wheelchair    Picking Up Objects: 03 - Partial/Moderate Assistance: Helper does 26-50% of the  activity  Assistive Device(s:)   None    Interventions:  Self Care/Home Management:  Patient participates in morning routine  consisting of showering, toileting, dressing, oral caree with overall min A to  CGA primarily due to decreased insight and safety awareness.  Patient was left seated in chair at nurse's station. Handoff to nurse completed.  Chair alarm in place and activated.  Personal items within reach.  Pain Reassessment: Pain was not reassessed as no pain was reported.  Equipment Provided: None at this time.    Education Provided:    Education Provided: Activities of daily living. Functional transfers. Safety.       Audience: Patient.       Mode: Explanation.       Response: Needs reinforcement.    ASSESSMENT  Patient will likely continue to require supervision on discharge home and 24/7  supervision is recommended. Recommend family training commence as soon as  possible.    Long Term Goals: Pt will complete AM routine with distant supervision and no  external structure or cues in a safe manner in order to return home safely with  wife.  Pt will complete toileting and toilet transfers w LRAD in a safe manner at a mod  I level in order to return home with support from wife.   Pt will complete multi level  transfers with supervision with LRAD and using  compensatory strategies for visual defcitis as needed,  in order to  navigate  home environment safely.  Pt will complete simple meal prep, light IADL s at a supervision level with no  more than 2 safety cues.  Pt's wife will demonstrate ability to provide recommended supervision and  assisstance with all aspects of ADL s and IADL s in order to return home safely.  10-14 days from evaluation 03/07/19  Short Term Goals:    Progress Towards Goals:   LONG TERM GOAL REVIEW:       1. Pt will complete AM routine with distant supervision and no external  structure or cues in a safe manner in order to return home safely with wife. -  Not Met: consistent CGA to min A required and verbal cues for safety       2. Pt will complete toileting and toilet transfers w LRAD in a safe manner  at a mod I level in order to return home with support from wife. - Not Met CGA  required from wheelchair level.       3. Pt will complete multi level  transfers with supervision with LRAD and  using compensatory strategies for visual defcitis as needed,  in order to  navigate home environment safely. - Not Met CGA       4. Pt will complete simple meal prep, light IADL s at a supervision level  with no more than 2 safety cues. - Not Met not yet attempted due to safety  concerns.       5. Pt's wife will demonstrate ability to provide recommended supervision  and assisstance with all aspects of ADL s and IADL s in order to return home  safely. - Not Met not yet attempted.  Time frame to achieve long term goal(s):  10-14 days from evaluation 03/07/19      PLAN  Continued Occupational Therapy is recommended.  Recommended Frequency/Duration/Intensity: 60-120 min per day, 5-6 times per week  1:1 and group as appropriate  Continued Activities Contributing Toward Care Plan: ther act, ther ex, ADL ,  IADL retraining, modalities, pain management, functional transfers, pt and fam  education,  safety , DME , D/c planning.  Patient is appropriate for treatment in gait group. Patient will benefit from  group therapy to integrate and  carry over functional social-pragmatic language  skills, receive support and constructive criticism from peers in a group  setting, experience modelling by peers    Care Plan  Identified problems from team documentation:  Problem: Impaired Cardiac Function  Cardiac: Primary Team Goal: Systolic blood pressure will be at or below 150 via  medications, diet,and improved activity level 100% by d/c/Active    Problem: Impaired Cognition  Cognition: Primary Team Goal: Pt will utilize internal/external compensatory  memory strategies to support new learning of precautions, medical management, as  well as brain injury education w/ only min-mod verbal cues from  staff/caregiver./Active    Problem: Impaired Mobility  Mobility: Primary Team Goal: Pt will be modI for bed mobility, and ambulation  with LRAD and SPV for stairs to return home safely./Active    Problem: Impaired Pain Management  Pain Mgmt: Primary Team Goal: Patient will  be able to participate in therapy  with pain level 2/10 prior to medicated for pain 100% by d/c/Active    Problem: Impaired Psychosocial Skills/Behavior  PsychoSocial: Primary Team Goal: Patient will regularly employ adaptive coping  skills for adjusting to medical illness, reduced function, and rehab  hospitalization via maintaining adherence with his rehab plan of care, and  expressing positive expectations for increasing his overall future functional  independence./    Problem: Impaired Self-care Mgmt/ADL/IADL  Self Care: Primary Team Goal: Pt's caregiver will provide recommended  environment, and supervision in order for pt to complete self are tasks at mod  I/supervision level and return home safely./Active    Problem: Safety Risk and Restraint  Safety: Primary Team Goal: Patient will recognize limitations and call for  assiaatance before attempting mobility 100% of the time in order to prevent  falls at home/Active    Add/Update Problems from this Treatment:  Update of existing problem:    Self Care Management: Patient currently requires CGA to min A for self care  primarily secondary to insight and safety awareness deficits and will require  family training prior to d/c home.    Discipline:  Occupational Therapy    Please review Integrated Patient View Care Plan Flowsheet for Team identified  Problems, Interventions, and Goals.    3 Hour Rule Minutes: 60 minutes of OT treatment this session count towards  intensity and duration of therapy requirement. Patient was seen for the full  scheduled time of OT treatment this session.  Therapy Mode Minutes: Individual: 60 minutes.    Signed by: Elsie Amis, OTR/L 03/12/2019 9:00:00 AM

## 2019-03-13 NOTE — Rehab Progress Note ([facility]) (Signed)
Jonathan [name]  MRN: [id number]  Account: [id number]  Session Start: 03/13/2019 12:00:00 AM  Session Stop: 03/13/2019 12:00:00 AM    Total Treatment Minutes:  Minutes    Rehabilitation Nursing  Inpatient Rehabilitation Shift Assessment    Rehab Diagnosis: TBI  Demographics:            Age: 38Y            Gender: Male  Primary Language: English    Date of Onset:  02/22/19  Date of Admission: 03/06/2019 12:57:06 PM    Rehabilitation Precautions Restrictions:   Bilateral lower extremities: WBAT, Risk for falls. Seizure precaution, skin  integrity    Patient Report: "Am I addidcted to this pain medications, I hurt, is it time for  more pain medication?  Patient/Caregiver Goals:  To go back to my normal state, doing my own things    Wounds/Incisions: No wounds or incisions.    Medication Review: No clinically significant medication issues identified this  shift.    Bowel and Bladder Output:                       Bladder (# only)             Bowel (# only)  Number of Episodes  Continent            5                            0  Incontinent          0                            0    Education Provided:    Education Provided: Precautions. Pain management. Pain scale. Medication  options. Side effects. Activities of daily living. Bed mobility. Safety.  Activities of daily living. Medication. Name and dosage. Administration.  Purpose. Side Effects.       Audience: Patient.       Mode: Explanation.  Demonstration.       Response: Applied knowledge.  Verbalized understanding.  Needs reinforcement.    Long Term Goals: 1. Patient will  be able to participate in therapy with pain  level 2/10 prior to medicated for pain 100% by d/c   2. Patient will recognize limitations and call for assiaatance before  attempting mobility 100% of the time in order to prevent falls at home - Goal  Not Met   3. Systolic blood pressure will be at or below 150 via medications, diet,and  improved activity level 100% by d/c  Two weeks  Short Term Goals:  1. Patient will  be able to participate in therapy with pain  level 2/10 prior to medicated for pain 50% by d/c - Goal Not Met   2. Patient will recognize limitations and call for assiaatance before  attempting mobility 50% of the time in order to prevent falls at home - Goal Not  Met   3. Systolic blood pressure will be at or below 150 via medications, diet,and  improved activity level 50% by d/c - Goal Not Met  One week    PROGRESS TOWARD GOALS: Pain management.  Interventions: Provide health teachings on pain monitoring using numeric scale,  Use of clinical indicators to monitor pain level, Explain medications ordered  for pain management, Determine etiology of pain, Evaluate, assess outcomes,  and/or effectiveness  of pain management, Assist patient to identify other/or  non- pharmacologic measures to alleviate pain (e.g., positioning, ROM exercises,  splinting, slow stretching exercises)    Response to intervention(s): Patient was open to learning but has no concept of  time so he just knows how much pain he is in    Ongoing Education/Training: continue to encourage patient to use alternative  forms of pain management and to not use his right side to reposition so that it  can rest    PLAN:  Rehab Nursing Specific Interventions:  Continue with the current Nursing Plan of Care.    TEAM CARE PLAN  Identified problems from team documentation:  Problem: Impaired Cardiac Function  Cardiac: Primary Team Goal: Systolic blood pressure will be at or below 150 via  medications, diet,and improved activity level 100% by d/c/Active    Problem: Impaired Cognition  Cognition: Primary Team Goal: Pt will utilize internal/external compensatory  memory strategies to support new learning of precautions, medical management, as  well as brain injury education w/ only min-mod verbal cues from  staff/caregiver./Active    Problem: Impaired Mobility  Mobility: Primary Team Goal: Pt will be modI for bed mobility, and ambulation  with LRAD  and SPV for stairs to return home safely./Active    Problem: Impaired Pain Management  Pain Mgmt: Primary Team Goal: Patient will  be able to participate in therapy  with pain level 2/10 prior to medicated for pain 100% by d/c/Active    Problem: Impaired Psychosocial Skills/Behavior  PsychoSocial: Primary Team Goal: Patient will regularly employ adaptive coping  skills for adjusting to medical illness, reduced function, and rehab  hospitalization via maintaining adherence with his rehab plan of care, and  expressing positive expectations for increasing his overall future functional  independence./    Problem: Impaired Self-care Mgmt/ADL/IADL  Self Care: Primary Team Goal: Pt's caregiver will provide recommended  environment, and supervision in order for pt to complete self are tasks at mod  I/supervision level and return home safely./Active    Problem: Safety Risk and Restraint  Safety: Primary Team Goal: Patient will recognize limitations and call for  assiaatance before attempting mobility 100% of the time in order to prevent  falls at home/Active    Please review Integrated Patient View Care Plan Flowsheet for Team identified  Problems, Interventions, and Goals.    Signed by: Jerene Dilling, RN 03/13/2019 1:51:00 AM

## 2019-03-13 NOTE — Rehab Progress Note ([facility]) (Signed)
NAMECELESTINE BOUGIE  MRN: [id number]  Account: [id number]  Session Start: 03/13/2019 12:00:00 AM  Session Stop: 03/13/2019 12:00:00 AM    Total Treatment Minutes:  Minutes    Rehabilitation Nursing  Inpatient Rehabilitation Shift Assessment    Rehab Diagnosis: TBI  Demographics:            Age: 20Y            Gender: Male  Primary Language: English    Date of Onset:  02/22/19  Date of Admission: 03/06/2019 12:57:06 PM    Rehabilitation Precautions Restrictions:   Bilateral lower extremities: WBAT, Risk for falls. Seizure precaution, skin  integrity    Patient Report: My ribs still hurt  Patient/Caregiver Goals:  To go back to my normal state, doing my own things    Wounds/Incisions: No wounds or incisions.    Medication Review: No clinically significant medication issues identified this  shift.    Bowel and Bladder Output:                       Bladder (# only)             Bowel (# only)  Number of Episodes  Continent            3                            0  Incontinent          0                            0    Education Provided:    Education Provided: Pain management. Pain scale. Medication options. Side  effects. Clinical indicators of pain. Bowel and bladder programs. Bladder  training program. Bowel Training program.  Brain Injury: What is brain injury, Types of brain injury, Signs /T/ Symptoms  associated with brain injury, Anatomy/Physiology of the brain, Rancho scale,  Agitation, Confusion, Personality changes, Emotional adjustments/depression,  Behavioral management, Arousal/responsiveness, Orientation, Thrombophlebitis  (DVT), Seizures, Pressure ulcer, Pulmonary complications Medication. Name and  dosage. Administration. Purpose. Side Effects. Interaction. Labs.       Audience: Patient and significant other.       Mode: Explanation.  Teacher, English as a foreign language provided.       Response: Verbalized understanding.  Needs reinforcement.    Long Term Goals: 1. Patient will  be able to participate in therapy with pain  level  2/10 prior to medicated for pain 100% by d/c   2. Patient will recognize limitations and call for assiaatance before  attempting mobility 100% of the time in order to prevent falls at home - Goal  Not Met   3. Systolic blood pressure will be at or below 150 via medications, diet,and  improved activity level 100% by d/c  Two weeks  Short Term Goals: 1. Patient will  be able to participate in therapy with pain  level 2/10 prior to medicated for pain 50% by d/c - Goal Not Met   2. Patient will recognize limitations and call for assiaatance before  attempting mobility 50% of the time in order to prevent falls at home - Goal Not  Met   3. Systolic blood pressure will be at or below 150 via medications, diet,and  improved activity level 50% by d/c - Goal Not Met  One week    PROGRESS TOWARD GOALS: Pain  management.  Interventions: Provide health teachings on pain monitoring using numeric scale,  Use of clinical indicators to monitor pain level, Explain medications ordered  for pain management, Determine etiology of pain, Evaluate, assess outcomes,  and/or effectiveness of pain management, Assist patient to identify other/or  non- pharmacologic measures to alleviate pain (e.g., positioning, ROM exercises,  splinting, slow stretching exercises), Reinforce the use of alternative therapy,  treatments and/or other pain modalities recommended by MD/PT/OT.    Response to intervention(s): Paitent required PRN pain medication for right rib  pain, relieved to a moderate  level.    Ongoing Education/Training: Continue assesing every hour and offer medication  and other therapeutic alternativrs to aid with pain managemnt.     Risk of falls/injuries.  Intervention(s): Teach patient/ family to evaluate situation and environment for  hazards, Fall risk assessment using EBP tool, Use of chair and/or bed alarms,  Make sure call light, personal belongings within the patient?s reach, Recognize,  assist, and anticipate basic needs, Establish  and/or assist with regular  toileting program, Frequent safety check, rounding, Use of non-skid footwear,  Maintain adequate lighting    Response to intervention(s): Patient impulsive not following safety precautions,  requiring AVS and frequent rounding.    Ongoing Education/Training: Continue with bed , chair alarm at all times. AVS  also necessary    PLAN:  Rehab Nursing Specific Interventions:  Continue with the current Nursing Plan of Care.    TEAM CARE PLAN  Identified problems from team documentation:  Problem: Impaired Cardiac Function  Cardiac: Primary Team Goal: Systolic blood pressure will be at or below 150 via  medications, diet,and improved activity level 100% by d/c/Active    Problem: Impaired Cognition  Cognition: Primary Team Goal: Pt will utilize internal/external compensatory  memory strategies to support new learning of precautions, medical management, as  well as brain injury education w/ only min-mod verbal cues from  staff/caregiver./Active    Problem: Impaired Mobility  Mobility: Primary Team Goal: Pt will be modI for bed mobility, and ambulation  with LRAD and SPV for stairs to return home safely./Active    Problem: Impaired Pain Management  Pain Mgmt: Primary Team Goal: Patient will  be able to participate in therapy  with pain level 2/10 prior to medicated for pain 100% by d/c/Active    Problem: Impaired Psychosocial Skills/Behavior  PsychoSocial: Primary Team Goal: Patient will regularly employ adaptive coping  skills for adjusting to medical illness, reduced function, and rehab  hospitalization via maintaining adherence with his rehab plan of care, and  expressing positive expectations for increasing his overall future functional  independence./    Problem: Impaired Self-care Mgmt/ADL/IADL  Self Care: Primary Team Goal: Pt's caregiver will provide recommended  environment, and supervision in order for pt to complete self are tasks at mod  I/supervision level and return home  safely./Active    Problem: Safety Risk and Restraint  Safety: Primary Team Goal: Patient will recognize limitations and call for  assiaatance before attempting mobility 100% of the time in order to prevent  falls at home/Active    Please review Integrated Patient View Care Plan Flowsheet for Team identified  Problems, Interventions, and Goals.    Signed by: Haze Boyden, RN 03/13/2019 7:00:00 PM

## 2019-03-13 NOTE — Rehab Progress Note ([facility]) (Signed)
Jonathan [name]  MRN: [id number]  Account: [id number]  Session Start: 03/13/2019 2:00:00 PM  Session Stop: 03/13/2019 3:00:00 PM    Total Treatment Minutes: 60.00 Minutes    Speech Language Pathology  Inpatient Rehabilitation Treatment Note    Rehab Diagnosis: TBI  Demographics:            Age: 28Y            Gender: Male  Rehabilitation Precautions/Restrictions:   Bilateral lower extremities: WBAT, Risk for falls. Seizure precaution, skin  integrity    Interventions:       Speech Treatment: Treatment addressing recall, attention, and problem  solving in context of family training as well as navigation of environment.       Speech Treatment: Spouse present for training. Pt given task of leading  route to/from treatment room. Reinforced need for elminating distractions and  minimizing verbal interruptions along way. Able to make accurate decisions about  locating cog suite 4/4 trials w/o cues. Engaged family member in conversation  about cognitive changes since [place]. Lead discussion on strategies for supporting  memory including establishing attention first. Endorsed removing distractions  and concentrating on one task at a time. Wife asking about need for elminating  notifications on phone/watch given how distracting they may be and clinician  offering suggestion of compromise through selecting only organization at a time  to receive news updates. Had conversation about memory aides through use of  smartphone, to-do list, and building in regular routine at home. Education  provided on cognitive fatigue along w/ management techniques plus recognizing  symptoms. Pt ambulating back to room and making appropriate decisions about  route. Relevant solution for fatigue in locating grab bar along wall yet needing  encouragement to accept w/c for mitigating fall risk. Handouts given to  caregiver on attention, memory, brain fatigue, cognitive strategies, etc.  Patient was returned to or left in bed. Handoff to nurse  completed.  Bed alarm in place and activated.  Oriented to call bell and placed within reach.  Personal items within reach.    ASSESSMENT  Pt needing initial reminders to limit verbal interruptions for discussion of  cognitive status w/ wife as well as problem solving ways to modify home tasks  for attention as well as recall. Agreeable to permitting spouse support w/  overseeing more complex responsibilities (finances). Recognizing his fatigue in  walking back to room yet not readily seeking optimal safety options (e.g.,  sitting for rest). Caregiver voicing understanding of precautions and  recommendations for home d/c. Plan to incorporate more hands-on opportunities in  upcoming trainings.    3 Hour Rule Minutes: 60 minutes of SLP treatment this session count towards  intensity and duration of therapy requirement. Patient was seen for the full  scheduled time of SLP treatment this session.  Therapy Mode Minutes: Individual: 60 minutes.    Signed by: Trenda Moots, M.A., CCC/SLP 03/13/2019 3:00:00 PM

## 2019-03-13 NOTE — Rehab Progress Note ([facility]) (Signed)
Jonathan [name]  MRN: [id number]  Account: [id number]  Session Start: 03/13/2019 9:00:00 AM  Session Stop: 03/13/2019 10:00:00 AM    Total Treatment Minutes: 60.00 Minutes    Physical Therapy  Inpatient Rehabilitation Treatment Note    Rehab Diagnosis: TBI  Demographics:            Age: 81Y            Gender: Male  Rehabilitation Precautions/Restrictions:   Bilateral lower extremities: WBAT, Risk for falls. Seizure precaution, skin  integrity    OBJECTIVE  Vital Signs:                       Before Activity              After Activity  Vitals  Position/Activity    sitting                      sitting  BP Systolic          128                          135  BP Diastolic         75                           82  Pulse                60                           64    Interventions:       Therapeutic Activities:  Pt practiced bed mobility with SPV. Pt practiced  SPT bed<>chair with SBA. Pt practiced gait without AD for 100' and CGA. Pt mass  practiced gait with SPC with CGA-SBA. Pt mass practiced stairs with step through  pattern and CGA to step-to pattern and SBA with R railing for 12 stairs. Pt  educated on observations of slight R sided weakness, and balance impairments,  gait impairments.  Patient was returned to or left in bed. Handoff to nurse completed.  Bed alarm in place and activated.  Oriented to call bell and placed within reach.  Personal items within reach.  Assistive devices positioned out of reach.    ASSESSMENT  Pt demonstrated improved gait with speed, stride length with SPC and SBA-CGA. Pt  demonstrated improved stairs with R railing and step-to pattern. Pt continues to  be argumentative about the use of a cane or step-to pattern with stairs for  safety. Pt continues to have safety awareness impairments. Pt recommended to use  SPC and step -to pattern for stairs for safety at this time. Pt will continue to  benefit from skilled physical therapy to address current impairments.    3 Hour Rule Minutes: 60  minutes of PT treatment this session count towards  intensity and duration of therapy requirement. Patient was seen for the full  scheduled time of PT treatment this session.  Therapy Mode Minutes: Individual: 60 minutes.    Signed by: Lorin Glass, DPT, PT 03/13/2019 10:00:00 AM
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
## 2019-03-13 NOTE — Rehab Progress Note ([facility]) (Signed)
NAMESTACE PEACE  MRN: [id number]  Account: [id number]  Session Start: 03/13/2019 8:00:00 AM  Session Stop: 03/13/2019 9:00:00 AM    Total Treatment Minutes: 60.00 Minutes    Occupational Therapy  Inpatient Rehabilitation Treatment Note    Rehab Diagnosis: TBI  Demographics:            Age: 8Y            Gender: Male  Rehabilitation Precautions/Restrictions:   Bilateral lower extremities: WBAT, Risk for falls. Seizure precaution, skin  integrity    Vital Signs:                       Before Activity              After Activity  Vitals  BP Systolic          -                            130  BP Diastolic         -                            82  Pulse                -                            60    Interventions:       Self Care/Home Management:  Patient participates in morning routine of the  following:   grooming: beard trimming with CGA to supervision while standing at sink, same  assist for toothbrushing while standing at sink   toileting: standing at toilet with SBA to CGA for balance    Toilet transfer: patient did not sit today and walks to toilet with CGA to SBA  without AD    UB dressing: Doff and don two shirts with supervision/SBA   Patient also participates in visual training activities to decrease diplopia  with patient reporting no significant changes at end of session.  Patient was left seated in chair at nurse's station. Handoff to nurse completed.  Chair alarm in place and activated.    ASSESSMENT  Patient limited by insight and balance primarily and will require 24/7  supervision/assistance on discharge.    3 Hour Rule Minutes: 60 minutes of OT treatment this session count towards  intensity and duration of therapy requirement. Patient was seen for the full  scheduled time of OT treatment this session.  Therapy Mode Minutes: Individual: 60 minutes.    Signed by: Elsie Amis, OTR/L 03/13/2019 9:00:00 AM

## 2019-03-13 NOTE — Rehab Progress Note ([facility]) (Signed)
NAMEJHONATHAN DESROCHES  MRN: [id number]  Account: [id number]  Session Start: 03/13/2019 12:00:00 AM  Session Stop: 03/13/2019 12:00:00 AM    Total Treatment Minutes:  Minutes    Case Management  Inpatient Rehabilitation Note    FAMILY TRAINING: 03/15/2019 @ 9AM/10AM/11AM    Signed by: Shirlean Mylar, MSW 03/13/2019 4:22:00 PM

## 2019-03-14 LAB — HEMOGLOBIN AND HEMATOCRIT, BLOOD
Hematocrit: 33.5 % — ABNORMAL LOW (ref 37.6–49.6)
Hgb: 11.4 g/dL — ABNORMAL LOW (ref 12.5–17.1)

## 2019-03-14 LAB — APTT
PTT: 32 s (ref 23–37)
PTT: 31 s (ref 23–37)

## 2019-03-14 MED ORDER — HEPARIN (PORCINE) IN D5W 50-5 UNIT/ML-% IV SOLN (UNITS/KG/HR ONLY)
10.70 [IU]/kg/h | INTRAVENOUS | Status: DC
Start: 2019-03-14 — End: 2019-03-17
  Administered 2019-03-14: 13:00:00 10.7 [IU]/kg/h via INTRAVENOUS
  Administered 2019-03-15: 14:00:00 15.7 [IU]/kg/h via INTRAVENOUS
  Administered 2019-03-15: 10:00:00 13.7 [IU]/kg/h via INTRAVENOUS
  Administered 2019-03-16 (×2): 16.7 [IU]/kg/h via INTRAVENOUS
  Filled 2019-03-14 (×5): qty 500

## 2019-03-14 MED ORDER — HEPARIN SODIUM (PORCINE) 5000 UNIT/ML IJ SOLN
3000.00 [IU] | INTRAMUSCULAR | Status: DC | PRN
Start: 2019-03-14 — End: 2019-03-17
  Administered 2019-03-14 – 2019-03-15 (×2): 3000 [IU] via INTRAVENOUS
  Filled 2019-03-14 (×2): qty 1

## 2019-03-14 MED ORDER — ASPIRIN EC 81 MG PO TBEC
81.00 mg | DELAYED_RELEASE_TABLET | Freq: Every day | ORAL | Status: DC
Start: 2019-03-14 — End: 2019-03-20
  Administered 2019-03-14 – 2019-03-20 (×7): 81 mg via ORAL
  Filled 2019-03-14 (×7): qty 1

## 2019-03-14 NOTE — Rehab Progress Note ([facility]) (Signed)
[name]  MRN: [id number]  Account: [id number]  Session Start: 03/14/2019 12:00:00 AM  Session Stop: 03/14/2019 12:00:00 AM    Total Treatment Minutes:  Minutes    Rehabilitation Nursing  Inpatient Rehabilitation Shift Assessment    Rehab Diagnosis: TBI  Demographics:            Age: 59Y            Gender: Male  Primary Language: English    Date of Onset:  02/22/19  Date of Admission: 03/06/2019 12:57:06 PM    Rehabilitation Precautions Restrictions:   Bilateral lower extremities: WBAT, Risk for falls. Seizure precaution, skin  integrity    Patient Report: Ok  Patient/Caregiver Goals:  To go back to my normal state, doing my own things    Wounds/Incisions: No wounds or incisions.    Medication Review: No clinically significant medication issues identified this  shift.    Bowel and Bladder Output:                       Bladder (# only)             Bowel (# only)  Number of Episodes  Continent            3                            0  Incontinent          0                            0    Education Provided:    Education Provided: Safety issues and interventions. Fall protocol. Medication.  Name and dosage. Administration. Purpose.       Audience: Patient.       Mode: Explanation.       Response: Needs practice.  Needs reinforcement.  No evidence of learning.    Long Term Goals: 1. Patient will  be able to participate in therapy with pain  level 2/10 prior to medicated for pain 100% by d/c   2. Patient will recognize limitations and call for assiaatance before  attempting mobility 100% of the time in order to prevent falls at home - Goal  Not Met   3. Systolic blood pressure will be at or below 150 via medications, diet,and  improved activity level 100% by d/c  Two weeks  Short Term Goals: 1. Patient will  be able to participate in therapy with pain  level 2/10 prior to medicated for pain 50% by d/c - Goal Not Met   2. Patient will recognize limitations and call for assiaatance before  attempting mobility 50% of  the time in order to prevent falls at home - Goal Not  Met   3. Systolic blood pressure will be at or below 150 via medications, diet,and  improved activity level 50% by d/c - Goal Not Met  One week    PROGRESS TOWARD GOALS: Risk of falls/injuries.  Intervention(s): Teach patient/ family to evaluate situation and environment for  hazards, Fall risk assessment using EBP tool, Use of chair and/or bed alarms,  Make sure call light, personal belongings within the patient?s reach, Recognize,  assist, and anticipate basic needs, Establish and/or assist with regular  toileting program, Frequent safety check, rounding, Use of non-skid footwear,  Maintain adequate lighting, Reinforce safety instruction for transfer  techniques,  gait training and use of mobility devices recommended by PT/OT,  Encourage and/assist with safe use of assistive devices during ambulation    Response to intervention(s): Pt is impulsive and does not call for assistance to  get out of bed. Frequently reminded.    Ongoing Education/Training: Continue education of saftey transfer strategies and  the importance of calling assistance during transfers. Apply fall precauations  all the time.    PLAN:  Rehab Nursing Specific Interventions:  Continue with the current Nursing Plan of Care.    TEAM CARE PLAN  Identified problems from team documentation:  Problem: Impaired Cardiac Function  Cardiac: Primary Team Goal: Systolic blood pressure will be at or below 150 via  medications, diet,and improved activity level 100% by d/c/Active    Problem: Impaired Cognition  Cognition: Primary Team Goal: Pt will utilize internal/external compensatory  memory strategies to support new learning of precautions, medical management, as  well as brain injury education w/ only min-mod verbal cues from  staff/caregiver./Active    Problem: Impaired Mobility  Mobility: Primary Team Goal: Pt will be modI for bed mobility, and ambulation  with LRAD and SPV for stairs to return home  safely./Active    Problem: Impaired Pain Management  Pain Mgmt: Primary Team Goal: Patient will  be able to participate in therapy  with pain level 2/10 prior to medicated for pain 100% by d/c/Active    Problem: Impaired Psychosocial Skills/Behavior  PsychoSocial: Primary Team Goal: Patient will regularly employ adaptive coping  strategies to assist with mood regulation. Jonathan [name]    Problem: Impaired Self-care Mgmt/ADL/IADL  Self Care: Primary Team Goal: Pt's caregiver will provide recommended  environment, and supervision in order for pt to complete self are tasks at mod  I/supervision level and return home safely./Active    Problem: Safety Risk and Restraint  Safety: Primary Team Goal: Patient will recognize limitations and call for  assiaatance before attempting mobility 100% of the time in order to prevent  falls at home/Active    Please review Integrated Patient View Care Plan Flowsheet for Team identified  Problems, Interventions, and Goals.    Signed by: Jonathan Fuse, RN 03/14/2019 1:59:00 AM
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
## 2019-03-14 NOTE — Rehab Progress Note ([facility]) (Signed)
Jonathan [name]  MRN: [id number]  Account: [id number]  Session Start: 03/14/2019 9:00:00 AM  Session Stop: 03/14/2019 10:00:00 AM    Total Treatment Minutes:  Minutes    Therapeutic Recreation  Inpatient Rehabilitation Progress Note    Rehab Diagnosis: TBI  Demographics:            Age: 59Y            Gender: Male  Rehabilitation Precautions/Restrictions:   Bilateral lower extremities: WBAT, Risk for falls. Seizure precaution, skin  integrity    SUBJECTIVE  Patient Reports: "I want to play this with my grandson."  Pain: Patient currently without complaints of pain.    OBJECTIVE    Interventions: The following treatment was provided:  Avocational Activity-Cognitive:  Pt engaged in trivia game in dayroom setting  with focus on cognitive stimulation and socialization.  Pain Reassessment: Pain was not reassessed as no pain was reported.    Education Provided:    Education Provided: Cognitive functioning. Benefit and value of leisure  activity.       Audience: Patient.       Mode: Explanation.  Demonstration.       Response: Applied knowledge.  Demonstrated skill.    ASSESSMENT  Pt engaged in game req. mod cues for turn taking and to recall details. Pt with  improved ability to follow structure of task. Continues to be limited by  impaired memory. Good cooperation.    PLAN  Therapeutic Recreation services are recommended to address: cognition, mobility,  endurance, education, socialization    Signed by: Pearson Forster, CTRS 03/14/2019 10:00:00 AM

## 2019-03-14 NOTE — Rehab Progress Note ([facility]) (Signed)
Jonathan [name]  MRN: [id number]  Account: [id number]  Session Start: 03/14/2019 1:00:00 PM  Session Stop: 03/14/2019 2:10:00 PM    Total Treatment Minutes: 70.00 Minutes    Physical Therapy  Inpatient Rehabilitation Treatment Note    Rehab Diagnosis: TBI  Demographics:            Age: 36Y            Gender: Male  Rehabilitation Precautions/Restrictions:   Bilateral lower extremities: WBAT, Risk for falls. Seizure precaution, skin  integrity    OBJECTIVE  Vital Signs:                       Before Activity              After Activity  Vitals  Position/Activity    sitting                      -  BP Systolic          119                          -  BP Diastolic         73                           -  Pulse                60                           -    Interventions:       Therapeutic Activities:  Pt accompanied by wife for therapy session. Pt  mass practiced supine bridges, bridges with RTB, sidelying clamshells with RTB,  clamshells normal/reverse with RTB for muscle activation and strengthening. Pt  wife educated on current status of patient, progressions, need for assistive  devices, weakness, impulsivity, posture, anatomy, and continued care at home. Pt  donned abdominal binder for pain relief. Pt practiced gait with RW for 600' and  CGA and VCs for posture and position within walker.  Patient was returned to or left in bed. Handoff to nurse completed.  Bed alarm in place and activated.  Oriented to call bell and placed within reach.  Personal items within reach.  Assistive devices positioned out of reach.    ASSESSMENT  Wife receptive to education and verbalized understanding. Pt able to be  redirected to complete more active exercise of gait. Pt continues to demonstrate  forward flexion of trunk throughout gait. Pt continues to be impulsive and use  humor to not address impairments. Pt will continue to benefit from skille  physical therapy to address current impairments.    3 Hour Rule Minutes: 70 minutes of PT  treatment this session count towards  intensity and duration of therapy requirement. Patient was seen for the full  scheduled time of PT treatment this session.  Therapy Mode Minutes: Individual: 70 minutes.    Signed by: Lorin Glass, DPT, PT 03/14/2019 2:00:00 PM

## 2019-03-14 NOTE — Rehab Progress Note ([facility]) (Signed)
Jonathan [name]  MRN: [id number]  Account: [id number]  Session Start: 03/14/2019 12:00:00 AM  Session Stop: 03/14/2019 12:00:00 AM    Total Treatment Minutes:  Minutes    Rehabilitation Nursing  Inpatient Rehabilitation Shift Assessment    Rehab Diagnosis: TBI  Demographics:            Age: 51Y            Gender: Male  Primary Language: English    Date of Onset:  02/22/19  Date of Admission: 03/06/2019 12:57:06 PM    Rehabilitation Precautions Restrictions:   Bilateral lower extremities: WBAT, Risk for falls. Seizure precaution, skin  integrity    Patient Report: "I can walk very well"  Patient/Caregiver Goals:  To go back to my normal state, doing my own things    Wounds/Incisions: No wounds or incisions.    Medication Review: No clinically significant medication issues identified this  shift.    Bowel and Bladder Output:                       Bladder (# only)             Bowel (# only)  Number of Episodes  Continent            3                            1  Incontinent          0                            0    Education Provided:    Education Provided: Safety issues and interventions. Fall protocol. Impulsivity.  Supervision requirements. Use of adaptive devices. Functional transfers.  Medication. Name and dosage. Administration. Purpose. Side Effects. Pain  management. Pain scale. Medication options. Side effects. Plan of care.       Audience: Patient and significant other.       Mode: Explanation.       Response: Verbalized understanding.    Long Term Goals: 1. Patient will  be able to participate in therapy with pain  level 2/10 prior to medicated for pain 100% by d/c   2. Patient will recognize limitations and call for assiaatance before  attempting mobility 100% of the time in order to prevent falls at home - Goal  Not Met   3. Systolic blood pressure will be at or below 150 via medications, diet,and  improved activity level 100% by d/c  Two weeks  Short Term Goals: 1. Patient will  be able to participate in  therapy with pain  level 2/10 prior to medicated for pain 50% by d/c - Goal Not Met   2. Patient will recognize limitations and call for assiaatance before  attempting mobility 50% of the time in order to prevent falls at home - Goal Not  Met   3. Systolic blood pressure will be at or below 150 via medications, diet,and  improved activity level 50% by d/c - Goal Not Met  One week    PROGRESS TOWARD GOALS: Medication management. Assess patient/caregiver  understanding of medication administration steps, Provide patient/caregiver with  updated medication administration record (M.A.R), Evaluate teach-back of  medication administration by patient/caregiver  Response to Medication Management Interventions: Provided education about IV  Heparin. Informed patient and spouse about purpose, risks, and side effects.  Focus of Ongoing Education and Training: Continue to monitor patient for  bleeding.    PLAN:  Rehab Nursing Specific Interventions:  Continue with the current Nursing Plan of Care.    TEAM CARE PLAN  Identified problems from team documentation:  Problem: Impaired Cardiac Function  Cardiac: Primary Team Goal: Systolic blood pressure will be at or below 150 via  medications, diet,and improved activity level 100% by d/c/Active    Problem: Impaired Cognition  Cognition: Primary Team Goal: Pt will utilize internal/external compensatory  memory strategies to support new learning of precautions, medical management, as  well as brain injury education w/ only min-mod verbal cues from  staff/caregiver./Active    Problem: Impaired Mobility  Mobility: Primary Team Goal: Pt will be modI for bed mobility, and ambulation  with LRAD and SPV for stairs to return home safely./Active    Problem: Impaired Pain Management  Pain Mgmt: Primary Team Goal: Patient will  be able to participate in therapy  with pain level 2/10 prior to medicated for pain 100% by d/c/Active    Problem: Impaired Psychosocial Skills/Behavior  PsychoSocial:  Primary Team Goal: Patient will regularly employ adaptive coping  strategies to assist with mood regulation. Jonathan [name]    Problem: Impaired Self-care Mgmt/ADL/IADL  Self Care: Primary Team Goal: Pt's caregiver will provide recommended  environment, and supervision in order for pt to complete self are tasks at mod  I/supervision level and return home safely./Active    Problem: Safety Risk and Restraint  Safety: Primary Team Goal: Patient will recognize limitations and call for  assiaatance before attempting mobility 100% of the time in order to prevent  falls at home/Active    Please review Integrated Patient View Care Plan Flowsheet for Team identified  Problems, Interventions, and Goals.    Signed by: Rubye Beach, RN 03/14/2019 4:16:00 PM
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
## 2019-03-14 NOTE — Rehab Progress Note ([facility]) (Signed)
Jonathan [name]  MRN: [id number]  Account: [id number]  Session Start: 03/14/2019 11:00:00 AM  Session Stop: 03/14/2019 12:00:00 PM    Total Treatment Minutes: 60.00 Minutes    Speech Language Pathology  Inpatient Rehabilitation Treatment Note    Rehab Diagnosis: TBI  Demographics:            Age: 71Y            Gender: Male  Rehabilitation Precautions/Restrictions:   Bilateral lower extremities: WBAT, Risk for falls. Seizure precaution, skin  integrity    Interventions:       Speech Treatment: Treatment targeting recall, attention, and problem  solving,       Speech Treatment: Some initial lability upon learning recent news of  tentative d/c date 8/19 though redirectable w/ encouragement to initiate  intervention. Pt instructed to sign out at nurses' station and ambulate towards  familiar treatment space. Ambulance person for recording time and seeking  employee badge to writing SLP name in binder. Made relevant decisions regarding  navigation to cognitive suite 4/4 opps w/ only incidental verbal cues. Engaged  in attention to detail activity involving identifying typos in complex text.  Produced 9/15 correct responses w/o cues; spontaneously sought re-checking of  answers before submission 1X. Given feedback on location of errors within select  paragraphs, improved to 5/6. Next, completed calculating tips task w/ 2/6  accurate responses. Following review of errors, improved to 4/6; time  constraints limiting ability to repair remaining mistakes and assigned for  homework.  Patient was returned to or left in bed. Handoff to nurse completed.  Bed alarm in place and activated.  Oriented to call bell and placed within reach.  Personal items within reach.    ASSESSMENT  Improved sustained attention for functional tasks up to approx. 15-20 mins w/o  re-direction. Noting some influence of unresolved diplopia per pt report and  advised on outpatient f/u w/ neuro opthamologist.    3 Hour Rule Minutes: 60 minutes of SLP  treatment this session count towards  intensity and duration of therapy requirement. Patient was seen for the full  scheduled time of SLP treatment this session.  Therapy Mode Minutes: Individual: 60 minutes.    Signed by: Trenda Moots, M.A., CCC/SLP 03/14/2019 12:00:00 PM

## 2019-03-14 NOTE — Rehab Progress Note ([facility]) (Signed)
Jonathan [name]  MRN: [id number]  Account: [id number]  Session Start: 03/14/2019 8:00:00 AM  Session Stop: 03/14/2019 9:00:00 AM    Total Treatment Minutes: 60.00 Minutes    Occupational Therapy  Inpatient Rehabilitation Treatment Note    Rehab Diagnosis: TBI  Demographics:            Age: 89Y            Gender: Male  Rehabilitation Precautions/Restrictions:   Bilateral lower extremities: WBAT, Risk for falls. Seizure precaution, skin  integrity    Vital Signs:                       Before Activity              After Activity  Vitals  BP Systolic          137                          -  BP Diastolic         76                           -  Pulse                61                           -    Interventions:       Therapeutic Activities:  Patient participates in OT session with focus on  attention and memory, especially related to environment navigation which  included finding assorted locations on his floor. Patient uses humor to distract  from his deficits, and requires redirection to remain on task. Overall requires  approximately 50% cueing and extra time to find any location on unit.  Patient was left seated in chair at nurse's station. Handoff to nurse completed.  Chair alarm in place and activated.  Personal items within reach.    ASSESSMENT  Patient is limited by attention, memory, insight, and appreciation of deficits.  He will require 24/7 supervision/assistance.    3 Hour Rule Minutes: 60 minutes of OT treatment this session count towards  intensity and duration of therapy requirement. Patient was seen for the full  scheduled time of OT treatment this session.  Therapy Mode Minutes: Individual: 60 minutes.    Signed by: Elsie Amis, OTR/L 03/14/2019 9:00:00 AM

## 2019-03-15 DIAGNOSIS — Z950 Presence of cardiac pacemaker: Secondary | ICD-10-CM

## 2019-03-15 LAB — APTT
PTT: 53 s — ABNORMAL HIGH (ref 23–37)
PTT: 41 s — ABNORMAL HIGH (ref 23–37)
PTT: 52 s — ABNORMAL HIGH (ref 23–37)

## 2019-03-15 LAB — CBC
Absolute NRBC: 0 10*3/uL (ref 0.00–0.00)
Hematocrit: 35.4 % — ABNORMAL LOW (ref 37.6–49.6)
Hgb: 11.8 g/dL — ABNORMAL LOW (ref 12.5–17.1)
MCH: 29.5 pg (ref 25.1–33.5)
MCHC: 33.3 g/dL (ref 31.5–35.8)
MCV: 88.5 fL (ref 78.0–96.0)
MPV: 9.1 fL (ref 8.9–12.5)
Nucleated RBC: 0 /100 WBC (ref 0.0–0.0)
Platelets: 426 10*3/uL — ABNORMAL HIGH (ref 142–346)
RBC: 4 10*6/uL — ABNORMAL LOW (ref 4.20–5.90)
RDW: 14 % (ref 11–15)
WBC: 6.55 10*3/uL (ref 3.10–9.50)

## 2019-03-15 NOTE — Rehab Progress Note ([facility]) (Signed)
Jonathan [name]  MRN: [id number]  Account: [id number]  Session Start: 03/15/2019 11:00:00 AM  Session Stop: 03/15/2019 12:00:00 PM    Total Treatment Minutes: 60.00 Minutes    Speech Language Pathology  Inpatient Rehabilitation Progress Note    Rehab Diagnosis: TBI  Demographics:            Age: 79Y            Gender: Male  Rehabilitation Precautions/Restrictions:   Bilateral lower extremities: WBAT, Risk for falls. Seizure precaution, skin  integrity    SUBJECTIVE  Patient Report: "I didn't do my homework."  Patient/Caregiver Goals:  To go back to my normal state, doing my own things  Pain: Patient currently without complaints of pain.    OBJECTIVE    Interventions:       Speech Treatment: Treatment focusing on attention, recall, and problem  solving in context of family training.       Speech Treatment: Resumed previously started worksheet related to  calculating tips. SLP discussing rationale for task w/ caregiver and instructing  them on supervision A including cues for slowing down, double checking work, as  well as establishing rounding procedure for calculations. Achieved 8/8 given mod  fading to min-mod cues to recheck mistakes and ensure accurate input for  calculator. On med box errors, clinician reviewing importance of attention to  detail w/ respect to medication organization. Instructed wife on verifying  accuracy w/ his detection of pill mistakes and carefully reviewing med labels  before acting. She was able to appropriately cue him for slowing down and  verbalizing organization strategies aloud (e.g., checking off corrections vs  circling errors). Pt moving through task rather methodically by going day-to-day  and morning-to-evening. Found mistakes 7/7 instances w/ only min A from wife.  Developed Music therapist for memory of medication information. Using [employer], transferred salient details onto simplified organization chart (names,  purposes, and frequencies). Input approx. 6/6 meds w/  gradual escalation of  cognitive fatigue symptoms (cues from wife to find placement on page and  re-check his written responses, closing eyes). Education provided to spouse on  management techniques for fatigue as well as chunking time spent to alleviate  such breakdowns.  Patient was left seated in chair in his/her room.  Patient was left seated in chair with family/friends present. Handoff to nurse  completed.  Chair alarm in place and activated.  Personal items within reach.  Pain Reassessment: Pain was not reassessed as no pain was reported.    Education Provided:    Education Provided: Medications. Cognitive fatigue. Cognitive strategies .       Audience: Patient and significant other.       Mode: Explanation.  Demonstration.       Response: Verbalized understanding.  Demonstrated skill.  Needs practice.  Needs reinforcement.    ASSESSMENT  Pt w/ gradual progress towards cognitive-linguistic goals this week. Still  presents w/ significant impairments in attention, recall, and insight. Caregiver  partaking in training and able to cue as needed for attending to details w/  calculations, however multiple instances of pt declining support. Requires  encouragement and reinforcement of brain injury education to appreciate extent  of his deficits as they related to daily living activities. Presenting w/ more  s/s of cognitive fatigue particularly as task duration lengthens and beginning  to slowly recognize impact on his accuracy. Receptive to employing memory  strategies such as medication sheet for retrieving details pertinent to his  regiment. Impulsivity  is an ongoing barrier. Spoke w/ team members about  offering further education to involved family members as well since pt is adept  in masking severity of injury w/ frequent levity. Wife beginning to appreciate  situation though needing more training.    Long Term Goals: Pt will utilize internal/external compensatory memory  strategies to support new learning of  precautions, medical management, as well  as brain injury education w/ only min-mod verbal cues from staff/caregiver. -  Goal Not Met  Pt will generate to-do list for home w/ at least 1 relevant manner of modifying  each task for adjustment to brain injury deficits given only incidental verbal  cues.  7-10 days from IE  Short Term Goals:    Progress Toward Goals:     LONG TERM GOAL REVIEW:       1. Pt will utilize internal/external compensatory memory strategies to  support new learning of precautions, medical management, as well as brain injury  education w/ only min-mod verbal cues from staff/caregiver. - Not Met: slowly  progressing though would benefit from more practice w/ aides including education  binder, smartphone, as well as medication sheet       2. Pt will generate to-do list for home w/ at least 1 relevant manner of  modifying each task for adjustment to brain injury deficits given only  incidental verbal cues. - Not Met: discontinue goal       New Goal: Pt will attend to details in structured context to produce 80%  accuracy when given mod cues for cognitive strategies such as re-checking work  and moving at slower pace.        New Goal 3. Pt will exhibit better insight into brain injury by providing  at least 2 impacted domains of cognition w/ corresponding examples of how they  effect daily living given min verbal cues.  Time frame to achieve long term goal(s):  1 week from 8/14    PLAN  Continued Speech Language Pathology is recommended to address:  Recommended Frequency/Duration/Intensity: 60-120 mins of skilled SLP services  5-6 days/week for 7-10 days  Continued Activities Contributing Toward Care Plan: cognitive-linguistic  retraining, compensatory strategy acquisition, brain injury education, and d/c  planning  The patient is appropriate for group treatment. Patient will benefit from group  therapy to practice and carry over skills learned in individual sessions with  new partners, clinicians  and contexts    Care Plan  Identified problems from team documentation:  Problem: Impaired Cardiac Function  Cardiac: Primary Team Goal: Systolic blood pressure will be at or below 150 via  medications, diet,and improved activity level 100% by d/c/Active    Problem: Impaired Cognition  Cognition: Primary Team Goal: Pt will utilize internal/external compensatory  memory strategies to support new learning of precautions, medical management, as  well as brain injury education w/ only min-mod verbal cues from  staff/caregiver./Active    Problem: Impaired Mobility  Mobility: Primary Team Goal: Pt will be modI for bed mobility, and ambulation  with LRAD and SPV for stairs to return home safely./Active    Problem: Impaired Pain Management  Pain Mgmt: Primary Team Goal: Patient will  be able to participate in therapy  with pain level 2/10 prior to medicated for pain 100% by d/c/Active    Problem: Impaired Psychosocial Skills/Behavior  PsychoSocial: Primary Team Goal: Patient will regularly employ adaptive coping  strategies to assist with mood regulation. [name]    Problem: Impaired Self-care Mgmt/ADL/IADL  Self Care: Primary  Team Goal: Pt's caregiver will provide recommended  environment, and supervision in order for pt to complete self are tasks at mod  I/supervision level and return home safely./Active    Problem: Safety Risk and Restraint  Safety: Primary Team Goal: Patient will recognize limitations and call for  assiaatance before attempting mobility 100% of the time in order to prevent  falls at home/Active    Add/Update Problems from this Treatment:  Update of existing problem:   Cognition: Beginning to more frequently seek out simple memory aides for time  and schedule though needs further training w/ use of written resources for med  management as well as brain injury precautions    Discipline:  Speech/Language Pathology    Please review Integrated Patient View Care Plan Flowsheet for Team identified  Problems,  Interventions, and Goals.    3 Hour Rule Minutes: 60 minutes of SLP treatment this session count towards  intensity and duration of therapy requirement. Patient was seen for the full  scheduled time of SLP treatment this session.  Therapy Mode Minutes: Individual: 60 minutes.    Signed by: Trenda Moots, M.A., CCC/SLP 03/15/2019 12:00:00 PM

## 2019-03-15 NOTE — Rehab Progress Note ([facility]) (Signed)
NAMEBENFORD ASCH  MRN: [id number]  Account: [id number]  Session Start: 03/15/2019 1:00:00 PM  Session Stop: 03/15/2019 2:00:00 PM    Total Treatment Minutes: 60.00 Minutes    Psychology Services  Inpatient Rehabilitation Family Progress Note    Rehab Diagnosis: TBI  Demographics:            Age: 1Y            Gender: Male      Education Provided:    Education Provided: Adaptive coping skills and TBI education. .       Audience: Patient and Caregiver       Mode: Explanation.       Response: Verbalized understanding.    Interventions:   NPSY FAMILY HLTH INTERV INT   NPSY FAMILY HLTH INTERV ADD    ASSESSMENT  Impressions: Met briefly with patient's family to provide support and education  to assist with the adjustment/coping process. Discussed patient's  medical/cognitive status, successes and functional gains during his rehab stay.  Offered support, reassurance, and assisted with problem-solving around methods  which could be employed for the continual emotional support of the patient  during the remainder of his rehab staty. Discussed the efficacy of a recent  intervention for the improvement management of the patient's sleep and mood  management. Overall patient' family was open and receptive to supportive counsel  and education.    PLAN  Continued Psychology services are recommended to address: Emotional adjustment  to current medical status and recent injury.    Signed by: Lamar Blinks, PhD 03/15/2019 1:00:00 PM

## 2019-03-15 NOTE — Rehab Progress Note ([facility]) (Signed)
[name]  MRN: [id number]  Account: [id number]  Session Start: 03/15/2019 10:00:00 AM  Session Stop: 03/15/2019 11:08:00 AM    Total Treatment Minutes: 68.00 Minutes    Physical Therapy  Inpatient Rehabilitation Treatment Note    Rehab Diagnosis: TBI  Demographics:            Age: 1Y            Gender: Male  Rehabilitation Precautions/Restrictions:   Bilateral lower extremities: WBAT, Risk for falls. Seizure precaution, skin  integrity    OBJECTIVE  Vital Signs:                       Before Activity              After Activity  Vitals  Position/Activity    sitting                      sitting  BP Systolic          121                          128  BP Diastolic         78                           81  Pulse                59                           60    Interventions:       Therapeutic Activities:  Pt's wife here for family training. Pt practiced  gait with SPC with L UE for multiple sets of 2' with PT guarding, and wife  guarding CGA-Mina for steadying assist. Pt practiced bed mobility with SPV. Pt  practiced 6" curb with SPC and VCs for sequencing with therapist and minA for  steadying assist. Pt practiced 4 stairs with R railing with step-to pattern with  VCs for sequencing and safety. Pt attempted step through pattern and experienced  LOB which required modA for recovery while ascending stairs. Pt and wife  educated on need for supervision, guarding techniques with gait, frequent cues  for sequencing and safety with stairs.  Patient was handed off to next therapist.    ASSESSMENT  Pt demonstrated improved gait with SPC in L hand, but is unsteady with turns. Pt  demonstrated inconsistent performance with curbs and was unsteady throughout  curb trials. Pt insisted on trialing step-through pattern and required modA for  recovery due to LOB. Pt wife receptive to education during therapy session. Pt  will continue to benefit from skilled physical therapy to address current  impairments. Pt wife would  benefit from additional training prior to d/c.    3 Hour Rule Minutes: 68 minutes of PT treatment this session count towards  intensity and duration of therapy requirement. Patient was seen for the full  scheduled time of PT treatment this session.  Therapy Mode Minutes: Individual: 68 minutes.    Signed by: Lorin Glass, DPT, PT 03/15/2019 11:00:00 AM
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
## 2019-03-15 NOTE — Progress Notes (Signed)
Pt refused his Gabapentin 300 mg this morning, it was already opened so writer wasted it instead of returning. Pharmacy was notified.

## 2019-03-15 NOTE — Rehab Progress Note ([facility]) (Signed)
NAMEDEANDRA GADSON  MRN: [id number]  Account: [id number]  Session Start: 03/15/2019 12:00:00 AM  Session Stop: 03/15/2019 12:00:00 AM    Total Treatment Minutes:  Minutes    Rehabilitation Nursing  Inpatient Rehabilitation Shift Assessment    Rehab Diagnosis: TBI  Demographics:            Age: 44Y            Gender: Male  Primary Language: English    Date of Onset:  02/22/19  Date of Admission: 03/06/2019 12:57:06 PM    Rehabilitation Precautions Restrictions:   Bilateral lower extremities: WBAT, Risk for falls. Seizure precaution, skin  integrity    Patient Report: I need to use the bathroom  Patient/Caregiver Goals:  To go back to my normal state, doing my own things    Wounds/Incisions: No wounds or incisions.    Medication Review: No clinically significant medication issues identified this  shift.    Bowel and Bladder Output:                       Bladder (# only)             Bowel (# only)  Number of Episodes  Continent            3                            0  Incontinent          0                            0    Education Provided:    Education Provided: Precautions. Safety issues and interventions. Supervision  requirements. Fall protocol. Pain management. Pain scale. Medication options.  Side effects.       Audience: Patient.       Mode: Explanation.       Response: Needs reinforcement.    Long Term Goals: 1. Patient will  be able to participate in therapy with pain  level 2/10 prior to medicated for pain 100% by d/c   2. Patient will recognize limitations and call for assiaatance before  attempting mobility 100% of the time in order to prevent falls at home - Goal  Not Met   3. Systolic blood pressure will be at or below 150 via medications, diet,and  improved activity level 100% by d/c  Two weeks  Short Term Goals: 1. Patient will  be able to participate in therapy with pain  level 2/10 prior to medicated for pain 50% by d/c - Goal Not Met   2. Patient will recognize limitations and call for assiaatance  before  attempting mobility 50% of the time in order to prevent falls at home - Goal Not  Met   3. Systolic blood pressure will be at or below 150 via medications, diet,and  improved activity level 50% by d/c - Goal Not Met  One week    PROGRESS TOWARD GOALS: Risk of falls/injuries.  Intervention(s): Teach patient/ family to evaluate situation and environment for  hazards, Use of chair and/or bed alarms, Make sure call light, personal  belongings within the patient?s reach, Recognize, assist, and anticipate basic  needs, Establish and/or assist with regular toileting program, Provide  distraction, direct attention to other activities, Frequent safety check,  rounding, Use of non-skid footwear, Maintain adequate lighting  Response to intervention(s): Patient is is still impulsive and gets out of bed  without calling for assisstance    Ongoing Education/Training: Continue with  fall and safety precaution and AVS  monitor    PLAN:  Rehab Nursing Specific Interventions:  Continue with the current Nursing Plan of Care.    TEAM CARE PLAN  Identified problems from team documentation:  Problem: Impaired Cardiac Function  Cardiac: Primary Team Goal: Systolic blood pressure will be at or below 150 via  medications, diet,and improved activity level 100% by d/c/Active    Problem: Impaired Cognition  Cognition: Primary Team Goal: Pt will utilize internal/external compensatory  memory strategies to support new learning of precautions, medical management, as  well as brain injury education w/ only min-mod verbal cues from  staff/caregiver./Active    Problem: Impaired Mobility  Mobility: Primary Team Goal: Pt will be modI for bed mobility, and ambulation  with LRAD and SPV for stairs to return home safely./Active    Problem: Impaired Pain Management  Pain Mgmt: Primary Team Goal: Patient will  be able to participate in therapy  with pain level 2/10 prior to medicated for pain 100% by d/c/Active    Problem: Impaired Psychosocial  Skills/Behavior  PsychoSocial: Primary Team Goal: Patient will regularly employ adaptive coping  strategies to assist with mood regulation. [name]    Problem: Impaired Self-care Mgmt/ADL/IADL  Self Care: Primary Team Goal: Pt's caregiver will provide recommended  environment, and supervision in order for pt to complete self are tasks at mod  I/supervision level and return home safely./Active    Problem: Safety Risk and Restraint  Safety: Primary Team Goal: Patient will recognize limitations and call for  assiaatance before attempting mobility 100% of the time in order to prevent  falls at home/Active    Please review Integrated Patient View Care Plan Flowsheet for Team identified  Problems, Interventions, and Goals.    Signed by: Samule Dry, RN 03/15/2019 2:30:00 AM
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
## 2019-03-15 NOTE — Rehab Progress Note ([facility]) (Signed)
[name]  MRN: [id number]  Account: [id number]  Session Start: 03/15/2019 9:00:00 AM  Session Stop: 03/15/2019 10:00:00 AM    Total Treatment Minutes: 60.00 Minutes    Occupational Therapy  Inpatient Rehabilitation Treatment Note    Rehab Diagnosis: TBI  Demographics:            Age: 21Y            Gender: Male  Rehabilitation Precautions/Restrictions:   Bilateral lower extremities: WBAT, Risk for falls. Seizure precaution, skin  integrity    Vital Signs: HR 60 as noted on Stat EKG.    Interventions:       Self Care/Home Management:  Wife, [name] present for family training. Nursing  present on entrance of therapist and reports stat EKG needed. Patient remains  supine throughout testing, although requires significant cueing to remain still  and quiet.  During EKG, therapist educates patient's wife on patient's progress and safety,  especially related to bathing and dressing. Therapist initiates, but patient  conurs that patient should not drive post discharge. While awaiting telemetry  placement, patient's wife is educated on shower transfers and general bathroom  safety and is given CDC Fall Prevention Checklist. Therapist ensures patient and  wife's comfort before handing off to nurse.  Patient was returned to or left in bed.   wife present Handoff to nurse completed.  Bed alarm in place and activated.  Oriented to call bell and placed within reach.  Personal items within reach.    ASSESSMENT  Decreased insight and mild balance deficits impair ability to discharge home  without 24/7 supervision.    3 Hour Rule Minutes: 60 minutes of OT treatment this session count towards  intensity and duration of therapy requirement. Patient was seen for the full  scheduled time of OT treatment this session.  Therapy Mode Minutes: Individual: 60 minutes.    Signed by: Elsie Amis, OTR/L 03/15/2019 10:00:00 AM

## 2019-03-15 NOTE — Rehab Progress Note ([facility]) (Signed)
Jonathan [name]  MRN: [id number]  Account: [id number]  Session Start: 03/15/2019 12:00:00 AM  Session Stop: 03/15/2019 12:00:00 AM    Total Treatment Minutes:  Minutes    Rehabilitation Nursing  Inpatient Rehabilitation Shift Assessment    Rehab Diagnosis: TBI  Demographics:            Age: 64Y            Gender: Male  Primary Language: English    Date of Onset:  02/22/19  Date of Admission: 03/06/2019 12:57:06 PM    Rehabilitation Precautions Restrictions:   Bilateral lower extremities: WBAT, Risk for falls. Seizure precaution, skin  integrity    Patient Report: pain on right rib cage  Patient/Caregiver Goals:  To go back to my normal state, doing my own things    Wounds/Incisions: No wounds or incisions.    Medication Review: No clinically significant medication issues identified this  shift.    Bowel and Bladder Output:                       Bladder (# only)             Bowel (# only)  Number of Episodes  Continent            3                            0  Incontinent          0                            0    Education Provided:    Education Provided: Safety issues and interventions. Fall protocol. Medication.  Name and dosage. Administration. Purpose.       Audience: Patient.       Mode: Explanation.       Response: Needs practice.  Needs reinforcement.  No evidence of learning.    Long Term Goals: 1. Patient will  be able to participate in therapy with pain  level 2/10 prior to medicated for pain 100% by d/c   2. Patient will recognize limitations and call for assiaatance before  attempting mobility 100% of the time in order to prevent falls at home - Goal  Not Met   3. Systolic blood pressure will be at or below 150 via medications, diet,and  improved activity level 100% by d/c  Two weeks  Short Term Goals: 1. Patient will  be able to participate in therapy with pain  level 2/10 prior to medicated for pain 50% by d/c - Goal Not Met   2. Patient will recognize limitations and call for assiaatance  before  attempting mobility 50% of the time in order to prevent falls at home - Goal Not  Met   3. Systolic blood pressure will be at or below 150 via medications, diet,and  improved activity level 50% by d/c - Goal Not Met  One week    PROGRESS TOWARD GOALS: Risk of falls/injuries.  Intervention(s): Teach patient/ family to evaluate situation and environment for  hazards, Fall risk assessment using EBP tool, Use of chair and/or bed alarms,  Make sure call light, personal belongings within the patient?s reach, Recognize,  assist, and anticipate basic needs, Establish and/or assist with regular  toileting program, Provide distraction, direct attention to other activities,  Frequent safety check, rounding, Use of non-skid  footwear, Reinforce safety  instruction for transfer techniques, gait training and use of mobility devices  recommended by PT/OT, Allow patient and/family to identify safety problems and  environmental hazards at home and determine actions to prevent accidents/  injuries    Response to intervention(s): Pt does not call for assistance and gets out of  bed. Frequently redirected. AVS inplace    Ongoing Education/Training: Continue applying and reenforcing fall precautions.    PLAN:  Rehab Nursing Specific Interventions:  Continue with the current Nursing Plan of Care.    TEAM CARE PLAN  Identified problems from team documentation:  Problem: Impaired Cardiac Function  Cardiac: Primary Team Goal: Systolic blood pressure will be at or below 150 via  medications, diet,and improved activity level 100% by d/c/Active    Problem: Impaired Cognition  Cognition: Primary Team Goal: Pt will utilize internal/external compensatory  memory strategies to support new learning of precautions, medical management, as  well as brain injury education w/ only min-mod verbal cues from  staff/caregiver./Active    Problem: Impaired Mobility  Mobility: Primary Team Goal: Pt will be modI for bed mobility, and ambulation  with  LRAD and SPV for stairs to return home safely./Active    Problem: Impaired Pain Management  Pain Mgmt: Primary Team Goal: Patient will  be able to participate in therapy  with pain level 2/10 prior to medicated for pain 100% by d/c/Active    Problem: Impaired Psychosocial Skills/Behavior  PsychoSocial: Primary Team Goal: Patient will regularly employ adaptive coping  strategies to assist with mood regulation. Jonathan [name]    Problem: Impaired Self-care Mgmt/ADL/IADL  Self Care: Primary Team Goal: Pt's caregiver will provide recommended  environment, and supervision in order for pt to complete self are tasks at mod  I/supervision level and return home safely./Active    Problem: Safety Risk and Restraint  Safety: Primary Team Goal: Patient will recognize limitations and call for  assiaatance before attempting mobility 100% of the time in order to prevent  falls at home/Active    Please review Integrated Patient View Care Plan Flowsheet for Team identified  Problems, Interventions, and Goals.    Signed by: Reina Fuse, RN 03/15/2019 11:58:00 PM

## 2019-03-16 ENCOUNTER — Inpatient Hospital Stay: Payer: Medicare Other

## 2019-03-16 DIAGNOSIS — I482 Chronic atrial fibrillation, unspecified: Secondary | ICD-10-CM

## 2019-03-16 LAB — CBC
Absolute NRBC: 0 10*3/uL (ref 0.00–0.00)
Hematocrit: 34.3 % — ABNORMAL LOW (ref 37.6–49.6)
Hgb: 11.4 g/dL — ABNORMAL LOW (ref 12.5–17.1)
MCH: 29.2 pg (ref 25.1–33.5)
MCHC: 33.2 g/dL (ref 31.5–35.8)
MCV: 87.7 fL (ref 78.0–96.0)
MPV: 8.9 fL (ref 8.9–12.5)
Nucleated RBC: 0 /100 WBC (ref 0.0–0.0)
Platelets: 391 10*3/uL — ABNORMAL HIGH (ref 142–346)
RBC: 3.91 10*6/uL — ABNORMAL LOW (ref 4.20–5.90)
RDW: 14 % (ref 11–15)
WBC: 7.49 10*3/uL (ref 3.10–9.50)

## 2019-03-16 LAB — APTT
PTT: 65 s — ABNORMAL HIGH (ref 23–37)
PTT: 63 s — ABNORMAL HIGH (ref 23–37)
PTT: 71 s — ABNORMAL HIGH (ref 23–37)

## 2019-03-16 LAB — PT/INR
PT INR: 1 (ref 0.9–1.1)
PT: 13.4 s (ref 12.6–15.0)

## 2019-03-16 MED ORDER — WARFARIN SODIUM 5 MG PO TABS
10.0000 mg | ORAL_TABLET | Freq: Every day | ORAL | Status: DC
Start: 2019-03-16 — End: 2019-03-20
  Administered 2019-03-16 – 2019-03-19 (×4): 10 mg via ORAL
  Filled 2019-03-16 (×5): qty 2

## 2019-03-16 NOTE — Nursing Progress Note (Signed)
Pt has two consecutive aPTT values;65 and 63. Per [facility] next drawing time would be @4am . Pt is resting comfortably. Will continue plan of care.

## 2019-03-16 NOTE — Rehab Progress Note ([facility]) (Signed)
NAMEQUALYN [name]  MRN: [id number]  Account: [id number]  Session Start: 03/16/2019 12:00:00 AM  Session Stop: 03/16/2019 12:00:00 AM    Total Treatment Minutes:  Minutes    Rehabilitation Nursing  Inpatient Rehabilitation Shift Assessment    Rehab Diagnosis: TBI  Demographics:            Age: 60Y            Gender: Male  Primary Language: English    Date of Onset:  02/22/19  Date of Admission: 03/06/2019 12:57:06 PM    Rehabilitation Precautions Restrictions:   Bilateral lower extremities: WBAT, Risk for falls. Seizure precaution, skin  integrity    Patient Report: i am doing good  Patient/Caregiver Goals:  To go back to my normal state, doing my own things    Wounds/Incisions: No wounds or incisions.    Medication Review: No clinically significant medication issues identified this  shift.    Bowel and Bladder Output:                       Bladder (# only)             Bowel (# only)  Number of Episodes  Continent            1                            0  Incontinent          0                            0    Education Provided:    Education Provided: Pain management. Pain scale. Safety issues and  interventions. Fall protocol. Impulsivity. Fall prevention/balance training.       Audience: Patient.       Mode: Explanation.       Response: Verbalized understanding.    Long Term Goals: 1. Patient will  be able to participate in therapy with pain  level 2/10 prior to medicated for pain 100% by d/c   2. Patient will recognize limitations and call for assiaatance before  attempting mobility 100% of the time in order to prevent falls at home - Goal  Not Met   3. Systolic blood pressure will be at or below 150 via medications, diet,and  improved activity level 100% by d/c  Two weeks  Short Term Goals: 1. Patient will  be able to participate in therapy with pain  level 2/10 prior to medicated for pain 50% by d/c - Goal Not Met   2. Patient will recognize limitations and call for assiaatance before  attempting mobility 50% of the  time in order to prevent falls at home - Goal Not  Met   3. Systolic blood pressure will be at or below 150 via medications, diet,and  improved activity level 50% by d/c - Goal Not Met  One week    PROGRESS TOWARD GOALS: Activity tolerance.  Intervention(s): Allow patient to identify strategies to maintain energy  conservation techniques, Encourage rest periods between activities    Response to intervention(s): okay    Ongoing Education/Training: encourage pt to call for helo before geting OOB 100%  of the time     Risk of falls/injuries.  Intervention(s): Teach patient/ family to evaluate situation and environment for  hazards, Fall risk assessment using EBP tool, Make sure call light,  personal  belongings within the patient?s reach, Recognize, assist, and anticipate basic  needs, Maintain adequate lighting, Reinforce safety instruction for transfer  techniques, gait training and use of mobility devices recommended by PT/OT,  Encourage and/assist with safe use of assistive devices during ambulation    Response to intervention(s): okay    Ongoing Education/Training: encourage pt to wear non skid socks 100% of the time    PLAN:  Rehab Nursing Specific Interventions:  Continue with the current Nursing Plan of Care.    TEAM CARE PLAN  Identified problems from team documentation:  Problem: Impaired Cardiac Function  Cardiac: Primary Team Goal: Systolic blood pressure will be at or below 150 via  medications, diet,and improved activity level 100% by d/c/Active    Problem: Impaired Cognition  Cognition: Primary Team Goal: Pt will utilize internal/external compensatory  memory strategies to support new learning of precautions, medical management, as  well as brain injury education w/ only min-mod verbal cues from  staff/caregiver./Active    Problem: Impaired Mobility  Mobility: Primary Team Goal: Pt will be modI for bed mobility, and ambulation  with LRAD and SPV for stairs to return home safely./Active    Problem:  Impaired Pain Management  Pain Mgmt: Primary Team Goal: Patient will  be able to participate in therapy  with pain level 2/10 prior to medicated for pain 100% by d/c/Active    Problem: Impaired Psychosocial Skills/Behavior  PsychoSocial: Primary Team Goal: Patient will regularly employ adaptive coping  strategies to assist with mood regulation. [name]    Problem: Impaired Self-care Mgmt/ADL/IADL  Self Care: Primary Team Goal: Pt's caregiver will provide recommended  environment, and supervision in order for pt to complete self are tasks at mod  I/supervision level and return home safely./Active    Problem: Safety Risk and Restraint  Safety: Primary Team Goal: Patient will recognize limitations and call for  assiaatance before attempting mobility 100% of the time in order to prevent  falls at home/Active    Please review Integrated Patient View Care Plan Flowsheet for Team identified  Problems, Interventions, and Goals.    Signed by: Jackelyn Knife, RN 03/16/2019 4:25:00 PM
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
## 2019-03-16 NOTE — Progress Notes (Signed)
[facility] Physiat[facility] Attending Face to Face Progress  Note  [X] Discussed with nurse  .[x] No new events    Subjective:  Feels well;  has no complaints.    No headache. No shortness of breath. No chest pain. No constipation.  Objective:  Vitals: BP 111/67   Pulse 60   Temp 97.3 F (36.3 C) (Oral)   Resp 18   Ht 1.829 m (6')   Wt 93 kg (205 lb 0.4 oz)   SpO2 99%   BMI 27.81 kg/m   Appears well.In no apparent distress.Resting comfortably   Awake ; normal mood and affect.Sclerae anicteric.   Moist mucous membranes.   No respiratory distress .        New labs   Results     Procedure Component Value Units Date/Time    APTT [[id number]] Collected:  03/16/19 1147     Updated:  03/16/19 1151    Narrative:       Obtain baseline aPTT prior to heparin initiation if not drawn  previously. Do not wait for aPTT result prior to heparin  initiation.  When therapeutic range is reached per protocol,  change aPTT frequency to daily at [facility] daily at 0400 until  heparin is discontinued, except for ICU patients - continue  q8h aPTTs.    APTT [[id number]]  (Abnormal) Collected:  03/16/19 0423     Updated:  03/16/19 0444     PTT 65 sec     Narrative:       Obtain baseline aPTT prior to heparin initiation if not drawn  previously. Do not wait for aPTT result prior to heparin  initiation.  When therapeutic range is reached per protocol,  change aPTT frequency to daily at [facility] daily at 0400 until  heparin is discontinued, except for ICU patients - continue  q8h aPTTs.    CBC without differential [[id number]]  (Abnormal) Collected:  03/16/19 0423    Specimen:  Blood Updated:  03/16/19 0434     WBC 7.49 x10 3/uL      Hgb 11.4 g/dL      Hematocrit 74.2 %      Platelets 391 x10 3/uL      RBC 3.91 x10 6/uL      MCV 87.7 fL      MCH 29.2 pg      MCHC 33.2 g/dL      RDW 14 %      MPV 8.9 fL      Nucleated RBC 0.0 /100 WBC      Absolute NRBC 0.00 x10 3/uL     Narrative:       Per low intensity heparin protocol.    APTT [[id number]]  (Abnormal) Collected:   03/15/19 2011     Updated:  03/15/19 2056     PTT 52 sec     Narrative:       Obtain baseline aPTT prior to heparin initiation if not drawn  previously. Do not wait for aPTT result prior to heparin  initiation.  When therapeutic range is reached per protocol,  change aPTT frequency to daily at [facility] daily at 0400 until  heparin is discontinued, except for ICU patients - continue  q8h aPTTs.    APTT [[id number]]  (Abnormal) Collected:  03/15/19 1233     Updated:  03/15/19 1247     PTT 41 sec     Narrative:       Obtain baseline aPTT prior to heparin initiation if not drawn  previously. Do not wait  for aPTT result prior to heparin  initiation.  When therapeutic range is reached per protocol,  change aPTT frequency to daily at [facility] daily at 0400 until  heparin is discontinued, except for ICU patients - continue  q8h aPTTs.          Medications:     Current Facility-Administered Medications   Medication Dose Route Frequency   . aspirin EC  81 mg Oral Daily   . cetirizine  10 mg Oral Daily   . gabapentin  300 mg Oral TID   . guaiFENesin  600 mg Oral Q12H SCH   . lacosamide  150 mg Oral BID   . lidocaine  1 patch Transdermal Q24H   . losartan  50 mg Oral Daily   . pantoprazole  40 mg Oral QAM AC   . polyethylene glycol  17 g Oral Daily   . sertraline  50 mg Oral Daily   . traZODone  50 mg Oral QHS       Medication Review  1. A complete drug regimen review was completed: YES  2. Were drug issues were found during review: NO   If yes to drug issues found during review:   What was the issue?:    What was the time the issue was identified?:    Was I contacted and action was taken by midnight of the next calendar day once issue was identified?:    Person who contacted me:    Action taken:       Assessment/Plan: 68 y.o. male  with dysfunction of mobility/ ADL due to TBI (traumatic brain injury)  Continue comprehensive intensive inpatient rehab program. Medically stable. Continue current management.     At least 15 minutes was  spent with the patient in total.     Letta Moynahan, MD  PM&R

## 2019-03-17 LAB — CBC
Absolute NRBC: 0 10*3/uL (ref 0.00–0.00)
Hematocrit: 33.5 % — ABNORMAL LOW (ref 37.6–49.6)
Hgb: 11.4 g/dL — ABNORMAL LOW (ref 12.5–17.1)
MCH: 29.8 pg (ref 25.1–33.5)
MCHC: 34 g/dL (ref 31.5–35.8)
MCV: 87.5 fL (ref 78.0–96.0)
MPV: 9.1 fL (ref 8.9–12.5)
Nucleated RBC: 0 /100 WBC (ref 0.0–0.0)
Platelets: 372 10*3/uL — ABNORMAL HIGH (ref 142–346)
RBC: 3.83 10*6/uL — ABNORMAL LOW (ref 4.20–5.90)
RDW: 14 % (ref 11–15)
WBC: 7.87 10*3/uL (ref 3.10–9.50)

## 2019-03-17 LAB — ECG 12-LEAD
Atrial Rate: 468 {beats}/min
Q-T Interval: 476 ms
QRS Duration: 196 ms
QTC Calculation (Bezet): 476 ms
R Axis: -53 degrees
T Axis: 80 degrees
Ventricular Rate: 60 {beats}/min

## 2019-03-17 LAB — APTT
PTT: 70 s — ABNORMAL HIGH (ref 23–37)
PTT: 34 s (ref 23–37)

## 2019-03-17 LAB — PT/INR
PT INR: 1.1 (ref 0.9–1.1)
PT INR: 1 (ref 0.9–1.1)
PT: 13.6 s (ref 12.6–15.0)
PT: 13.2 s (ref 12.6–15.0)

## 2019-03-17 LAB — BASIC METABOLIC PANEL
Anion Gap: 11 (ref 5.0–15.0)
BUN: 9 mg/dL (ref 9–28)
CO2: 25 mEq/L (ref 22–29)
Calcium: 9.8 mg/dL (ref 8.5–10.5)
Chloride: 97 mEq/L — ABNORMAL LOW (ref 100–111)
Creatinine: 0.8 mg/dL (ref 0.7–1.3)
Glucose: 109 mg/dL — ABNORMAL HIGH (ref 70–100)
Potassium: 4.3 mEq/L (ref 3.5–5.1)
Sodium: 133 mEq/L — ABNORMAL LOW (ref 136–145)

## 2019-03-17 LAB — GFR: EGFR: >60

## 2019-03-17 MED ORDER — ENOXAPARIN SODIUM 100 MG/ML SC SOLN
1.00 mg/kg | Freq: Two times a day (BID) | SUBCUTANEOUS | Status: DC
Start: 2019-03-17 — End: 2019-03-20
  Administered 2019-03-17 – 2019-03-20 (×7): 90 mg via SUBCUTANEOUS
  Filled 2019-03-17 (×7): qty 1

## 2019-03-17 NOTE — Progress Notes (Signed)
Patient refused dose of setraline 50 mg in the morning. Patient  wants to discontinue medication, will discuss with physician.

## 2019-03-17 NOTE — Progress Notes (Signed)
Heparin drop discontinued as per order . IV removed accidentally by patient, will not reinsert at this time , no IV medications. Patient denied any complaints.

## 2019-03-17 NOTE — Rehab Progress Note ([facility]) (Signed)
Jonathan [name]  MRN: [id number]  Account: [id number]  Session Start: 03/17/2019 12:00:00 AM  Session Stop: 03/17/2019 12:00:00 AM    Total Treatment Minutes:  Minutes    Rehabilitation Nursing  Inpatient Rehabilitation Shift Assessment    Rehab Diagnosis: TBI  Demographics:            Age: 46Y            Gender: Male  Primary Language: English    Date of Onset:  02/22/19  Date of Admission: 03/06/2019 12:57:06 PM    Rehabilitation Precautions Restrictions:   Bilateral lower extremities: WBAT, Risk for falls. Seizure precaution, skin  integrity    Patient Report: " I am doing okay, I need to go to the bathroom"  Patient/Caregiver Goals:  To go back to my normal state, doing my own things    Wounds/Incisions: No wounds or incisions.    Medication Review: No clinically significant medication issues identified this  shift.    Bowel and Bladder Output:                       Bladder (# only)             Bowel (# only)  Number of Episodes  Continent            3                            0  Incontinent          0                            0    Education Provided:    Education Provided: Safety issues and interventions. Supervision requirements.  Medication. Name and dosage. Name and dosage. Purpose. Side Effects.       Audience: Patient.       Mode: Explanation.  Demonstration.       Response: Needs practice.    Long Term Goals: 1. Patient will  be able to participate in therapy with pain  level 2/10 prior to medicated for pain 100% by d/c   2. Patient will recognize limitations and call for assiaatance before  attempting mobility 100% of the time in order to prevent falls at home - Goal  Not Met   3. Systolic blood pressure will be at or below 150 via medications, diet,and  improved activity level 100% by d/c  Two weeks  Short Term Goals: 1. Patient will  be able to participate in therapy with pain  level 2/10 prior to medicated for pain 50% by d/c - Goal Not Met   2. Patient will recognize limitations and call for  assiaatance before  attempting mobility 50% of the time in order to prevent falls at home - Goal Not  Met   3. Systolic blood pressure will be at or below 150 via medications, diet,and  improved activity level 50% by d/c - Goal Not Met  One week    PROGRESS TOWARD GOALS: Risk of falls/injuries.  Intervention(s): Teach patient/ family to evaluate situation and environment for  hazards, Fall risk assessment using EBP tool, Use of chair and/or bed alarms,  Make sure call light, personal belongings within the patient?s reach, Recognize,  assist, and anticipate basic needs, Establish and/or assist with regular  toileting program, Provide distraction, direct attention to other activities,  Frequent safety  check, rounding, Use of non-skid footwear, Maintain adequate  lighting, Reinforce safety instruction for transfer techniques, gait training  and use of mobility devices recommended by PT/OT    Response to intervention(s): Patient verbalized understanding, he is being  monitored with AVS. He does not call for assistance before OOB movements.    Ongoing Education/Training: Continue with th e above safety measures and AVS  monitoring. Respond to bed alarms and AVS alerts on time to prevent falls.     SHORT TERM GOAL REVIEW:  Time frame to achieve short term goal(s):  One week   LONG TERM GOAL REVIEW:  Timeframe to achieve long term goal(s):  Two weeks    PLAN:  Rehab Nursing Specific Interventions:  Continue with the current Nursing Plan of Care.    TEAM CARE PLAN  Identified problems from team documentation:  Problem: Impaired Cardiac Function  Cardiac: Primary Team Goal: Systolic blood pressure will be at or below 150 via  medications, diet,and improved activity level 100% by d/c/Active    Problem: Impaired Cognition  Cognition: Primary Team Goal: Pt will utilize internal/external compensatory  memory strategies to support new learning of precautions, medical management, as  well as brain injury education w/ only min-mod  verbal cues from  staff/caregiver./Active    Problem: Impaired Mobility  Mobility: Primary Team Goal: Pt will be modI for bed mobility, and ambulation  with LRAD and SPV for stairs to return home safely./Active    Problem: Impaired Pain Management  Pain Mgmt: Primary Team Goal: Patient will  be able to participate in therapy  with pain level 2/10 prior to medicated for pain 100% by d/c/Active    Problem: Impaired Psychosocial Skills/Behavior  PsychoSocial: Primary Team Goal: Patient will regularly employ adaptive coping  strategies to assist with mood regulation. Jonathan [name]    Problem: Impaired Self-care Mgmt/ADL/IADL  Self Care: Primary Team Goal: Pt's caregiver will provide recommended  environment, and supervision in order for pt to complete self are tasks at mod  I/supervision level and return home safely./Active    Problem: Safety Risk and Restraint  Safety: Primary Team Goal: Patient will recognize limitations and call for  assiaatance before attempting mobility 100% of the time in order to prevent  falls at home/Active    Please review Integrated Patient View Care Plan Flowsheet for Team identified  Problems, Interventions, and Goals.    Signed by: Odette Horns, RN 03/17/2019 1:15:00 AM
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
## 2019-03-17 NOTE — Rehab Progress Note ([facility]ilinks) (Signed)
Jonathan [name]  MRN: [id number]  Account: [id number]  Session Start: 03/17/2019 12:00:00 AM  Session Stop: 03/17/2019 12:00:00 AM    Total Treatment Minutes:  Minutes    Rehabilitation Nursing  Inpatient Rehabilitation Shift Assessment    Rehab Diagnosis: TBI  Demographics:            Age: 88Y            Gender: Male  Primary Language: English    Date of Onset:  02/22/19  Date of Admission: 03/06/2019 12:57:06 PM    Rehabilitation Precautions Restrictions:   Bilateral lower extremities: WBAT, Risk for falls. Seizure precaution, skin  integrity    Patient Report: I need an eye patch for double vision, it happens sometimes  Patient/Caregiver Goals:  To go back to my normal state, doing my own things    Wounds/Incisions: No wounds or incisions.    Medication Review: No clinically significant medication issues identified this  shift.    Bowel and Bladder Output:                       Bladder (# only)             Bowel (# only)  Number of Episodes  Continent            4                            0  Incontinent          0                            0    Education Provided:    Education Provided: Safety issues and interventions. Supervision requirements.  Use of adaptive devices. Fall protocol. Medication. Name and dosage.  Administration. Purpose. Side Effects.       Audience: Patient.       Mode: Explanation.       Response: Needs reinforcement.    Long Term Goals: 1. Patient will  be able to participate in therapy with pain  level 2/10 prior to medicated for pain 100% by d/c   2. Patient will recognize limitations and call for assiaatance before  attempting mobility 100% of the time in order to prevent falls at home - Goal  Not Met   3. Systolic blood pressure will be at or below 150 via medications, diet,and  improved activity level 100% by d/c  Two weeks  Short Term Goals: 1. Patient will  be able to participate in therapy with pain  level 2/10 prior to medicated for pain 50% by d/c - Goal Not Met   2. Patient will  recognize limitations and call for assiaatance before  attempting mobility 50% of the time in order to prevent falls at home - Goal Not  Met   3. Systolic blood pressure will be at or below 150 via medications, diet,and  improved activity level 50% by d/c - Goal Not Met  One week    PROGRESS TOWARD GOALS: Risk of falls/injuries.  Intervention(s): Teach patient/ family to evaluate situation and environment for  hazards, Medication review, Fall risk assessment using EBP tool, Use of chair  and/or bed alarms, Make sure call light, personal belongings within the  patient?s reach, Recognize, assist, and anticipate basic needs, Establish and/or  assist with regular toileting program, Provide distraction, direct attention to  other activities, Frequent safety check, rounding, Maintain adequate lighting,  Reinforce safety instruction for transfer techniques, gait training and use of  mobility devices recommended by PT/OT    Response to intervention(s): Patient very impulsive still need frequent rounds,  AVS and superivision for safety.    Ongoing Education/Training: Continue frequent round and redirection for safety     SHORT TERM GOAL REVIEW:       1. Patient will  be able to participate in therapy with pain level 2/10  prior to medicated for pain 50% by d/c - Met       2. Patient will recognize limitations and call for assiaatance before  attempting mobility 50% of the time in order to prevent falls at home - Not Met:  Patient very impulsive,       New Goal 4. Patient's bradychardia will improve to normal level in order to  discontinue telemtry.  Time frame to achieve short term goal(s):  One week    PLAN:  Rehab Nursing Specific Interventions:  Continue with the current Nursing Plan of Care.    TEAM CARE PLAN  Identified problems from team documentation:  Problem: Impaired Cardiac Function  Cardiac: Primary Team Goal: Systolic blood pressure will be at or below 150 via  medications, diet,and improved activity level 100%  by d/c/Active    Problem: Impaired Cognition  Cognition: Primary Team Goal: Pt will utilize internal/external compensatory  memory strategies to support new learning of precautions, medical management, as  well as brain injury education w/ only min-mod verbal cues from  staff/caregiver./Active    Problem: Impaired Mobility  Mobility: Primary Team Goal: Pt will be modI for bed mobility, and ambulation  with LRAD and SPV for stairs to return home safely./Active    Problem: Impaired Pain Management  Pain Mgmt: Primary Team Goal: Patient will  be able to participate in therapy  with pain level 2/10 prior to medicated for pain 100% by d/c/Active    Problem: Impaired Psychosocial Skills/Behavior  PsychoSocial: Primary Team Goal: Patient will regularly employ adaptive coping  strategies to assist with mood regulation. [name]    Problem: Impaired Self-care Mgmt/ADL/IADL  Self Care: Primary Team Goal: Pt's caregiver will provide recommended  environment, and supervision in order for pt to complete self are tasks at mod  I/supervision level and return home safely./Active    Problem: Safety Risk and Restraint  Safety: Primary Team Goal: Patient will recognize limitations and call for  assiaatance before attempting mobility 100% of the time in order to prevent  falls at home/Active    Please review Integrated Patient View Care Plan Flowsheet for Team identified  Problems, Interventions, and Goals.    Signed by: Haze Boyden, RN 03/17/2019 7:23:00 PM
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
## 2019-03-17 NOTE — Progress Notes (Signed)
[facility] Physiatry Attending Face to Face Progress  Note  [X] Discussed with nurse  .[x] No new events    Subjective:  Feels well;  has no complaints.    No headache. No shortness of breath. No chest pain. No constipation.  Objective:  Vitals: BP 127/75   Pulse (!) 59   Temp 97.5 F (36.4 C) (Oral)   Resp 18   Ht 1.829 m (6')   Wt 92.1 kg (203 lb 0.7 oz)   SpO2 98%   BMI 27.54 kg/m   Appears well.In no apparent distress.Resting comfortably   Awake ; normal mood and affect.Sclerae anicteric.   Moist mucous membranes.   No respiratory distress .        New labs   Results     Procedure Component Value Units Date/Time    Prothrombin time/INR [[id number]] Collected:  03/17/19 0432    Specimen:  Blood Updated:  03/17/19 0508     PT 13.6 sec      PT INR 1.1    Narrative:       Per low intensity heparin protocol.    APTT [[id number]]  (Abnormal) Collected:  03/17/19 0432     Updated:  03/17/19 0506     PTT 70 sec     Narrative:       Obtain baseline aPTT prior to heparin initiation if not drawn  previously. Do not wait for aPTT result prior to heparin  initiation.  When therapeutic range is reached per protocol,  change aPTT frequency to daily at [facility] daily at 0400 until  heparin is discontinued, except for ICU patients - continue  q8h aPTTs.    CBC without differential [[id number]]  (Abnormal) Collected:  03/17/19 0432    Specimen:  Blood Updated:  03/17/19 0456     WBC 7.87 x10 3/uL      Hgb 11.4 g/dL      Hematocrit 96.2 %      Platelets 372 x10 3/uL      RBC 3.83 x10 6/uL      MCV 87.5 fL      MCH 29.8 pg      MCHC 34.0 g/dL      RDW 14 %      MPV 9.1 fL      Nucleated RBC 0.0 /100 WBC      Absolute NRBC 0.00 x10 3/uL     Narrative:       Per low intensity heparin protocol.    APTT [[id number]]  (Abnormal) Collected:  03/16/19 2024     Updated:  03/16/19 2053     PTT 71 sec     Narrative:       Obtain baseline aPTT prior to heparin initiation if not drawn  previously. Do not wait for aPTT result prior to heparin   initiation.  When therapeutic range is reached per protocol,  change aPTT frequency to daily at [facility] daily at 0400 until  heparin is discontinued, except for ICU patients - continue  q8h aPTTs.    Prothrombin time/INR [[id number]] Collected:  03/16/19 1258    Specimen:  Blood Updated:  03/16/19 1315     PT 13.4 sec      PT INR 1.0    APTT [[id number]]  (Abnormal) Collected:  03/16/19 1147     Updated:  03/16/19 1300     PTT 63 sec     Narrative:       Obtain baseline aPTT prior to heparin initiation if not drawn  previously.  Do not wait for aPTT result prior to heparin  initiation.  When therapeutic range is reached per protocol,  change aPTT frequency to daily at [facility] daily at 0400 until  heparin is discontinued, except for ICU patients - continue  q8h aPTTs.          Medications:     Current Facility-Administered Medications   Medication Dose Route Frequency   . aspirin EC  81 mg Oral Daily   . cetirizine  10 mg Oral Daily   . enoxaparin  1 mg/kg Subcutaneous Q12H SCH   . gabapentin  300 mg Oral TID   . guaiFENesin  600 mg Oral Q12H SCH   . lacosamide  150 mg Oral BID   . lidocaine  1 patch Transdermal Q24H   . losartan  50 mg Oral Daily   . pantoprazole  40 mg Oral QAM AC   . polyethylene glycol  17 g Oral Daily   . sertraline  50 mg Oral Daily   . traZODone  50 mg Oral QHS   . warfarin  10 mg Oral Daily at 1800       Medication Review  1. A complete drug regimen review was completed: YES  2. Were drug issues were found during review: NO   If yes to drug issues found during review:   What was the issue?:    What was the time the issue was identified?:    Was I contacted and action was taken by midnight of the next calendar day once issue was identified?:    Person who contacted me:    Action taken:       Assessment/Plan: 68 y.o. male  with dysfunction of mobility/ ADL due to TBI (traumatic brain injury)  Continue comprehensive intensive inpatient rehab program. Medically stable. Continue current management.      At least 15 minutes was spent with the patient in total.     Per cardiology - patient can start back on coumadin.   Change heparin drip to lovenox per hospitalist. Heparin drip discontinued- start therapeutic lovenox 90 BID, last INR 1.1, continue coumadin.     Letta Moynahan, MD  PM&R

## 2019-03-18 LAB — PT/INR
PT INR: 1.1 (ref 0.9–1.1)
PT: 14.1 s (ref 12.6–15.0)

## 2019-03-18 LAB — CBC
Absolute NRBC: 0 10*3/uL (ref 0.00–0.00)
Hematocrit: 36.9 % — ABNORMAL LOW (ref 37.6–49.6)
Hgb: 12.5 g/dL (ref 12.5–17.1)
MCH: 29.7 pg (ref 25.1–33.5)
MCHC: 33.9 g/dL (ref 31.5–35.8)
MCV: 87.6 fL (ref 78.0–96.0)
MPV: 8.8 fL — ABNORMAL LOW (ref 8.9–12.5)
Nucleated RBC: 0 /100 WBC (ref 0.0–0.0)
Platelets: 373 10*3/uL — ABNORMAL HIGH (ref 142–346)
RBC: 4.21 10*6/uL (ref 4.20–5.90)
RDW: 14 % (ref 11–15)
WBC: 7.82 10*3/uL (ref 3.10–9.50)

## 2019-03-18 MED ORDER — PANTOPRAZOLE SODIUM 40 MG PO TBEC
40.00 mg | DELAYED_RELEASE_TABLET | Freq: Every day | ORAL | Status: DC | PRN
Start: 2019-03-18 — End: 2019-03-20

## 2019-03-18 NOTE — Progress Notes (Addendum)
Writer was informed by lab that patient will need CBC redrawn. Lab received two samples for patient, results were inconsistent. Lab will place new order for redraw.
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
## 2019-03-18 NOTE — Discharge Instructions (Signed)
Home Health Discharge Information     Your doctor has ordered Skilled Nursing, Physical Therapy, Occupational Therapy and Speech and Language Therapy in-home service(s) for you while you recuperate at home, to assist you in the transition from hospital to home.    The agency that you or your representative chose to provide the service:  Name of Home Health Agency Placement: [facility]] [phone]  Start of Care: 8/20     The above services were set up by:  Ashok Cordia  [facility] Liaison)   Phone  [phone]        IF YOU HAVE NOT HEARD FROM YOUR HOME YOUR HOME HEALTH AGENCY WITHIN 24-48 HOURS AFTER DISCHARGE PLEASE CALL YOUR AGENCY TO ARRANGE A TIME FOR YOUR FIRST VISIT. FOR ANY SCHEDULING CONCERNS OR QUESTIONS RELATED TO HOME HEALTH, SUCH AS TIME OR DATE PLEASE CONTACT YOUR HOME HEALTH AGENCY AT THE NUMBER LISTED ABOVE.

## 2019-03-18 NOTE — Rehab Progress Note ([facility]) (Signed)
NAMEMICAL KICKLIGHTER  MRN: [id number]  Account: [id number]  Session Start: 03/18/2019 10:00:00 AM  Session Stop: 03/18/2019 11:00:00 AM    Total Treatment Minutes:  Minutes    Therapeutic Recreation  Inpatient Rehabilitation Progress Note    Rehab Diagnosis: TBI  Demographics:            Age: 1Y            Gender: Male  Rehabilitation Precautions/Restrictions:   Bilateral lower extremities: WBAT, Risk for falls. Seizure precaution, skin  integrity    SUBJECTIVE  Patient Reports: I would normailly be playing tennis, but I want to get home to  see my grandson.  Pain: Patient currently without complaints of pain.    OBJECTIVE    Interventions: The following treatment was provided:  Avocational Activity-Cognitive:  Mr. Mineer greeted on unit and agreeable to  therapy.  Engaged in [place] in the [employer].  Pain Reassessment: Pain was not reassessed as no pain was reported.    Education Provided:  No education provided this session.    ASSESSMENT  Mr. Campos originally stating he wanted to play solitaire.  Encouraged to learn  new game, [facility], with similar rules, and can be played in a group.  Occassional cues with scanning for attention.  Nice participation, and  motivated.    PLAN  Therapeutic Recreation services are recommended to address: mobility, safety and  attention    Signed by: Idolina Primer, CTRS 03/18/2019 11:00:00 AM

## 2019-03-18 NOTE — Rehab Progress Note ([facility]) (Signed)
Jonathan [name]  MRN: [id number]  Account: [id number]  Session Start: 03/18/2019 1:00:00 PM  Session Stop: 03/18/2019 2:00:00 PM    Total Treatment Minutes: 60.00 Minutes    Speech Language Pathology  Inpatient Rehabilitation Treatment Note    Rehab Diagnosis: TBI  Demographics:            Age: 33Y            Gender: Male  Rehabilitation Precautions/Restrictions:   Bilateral lower extremities: WBAT, Risk for falls. Seizure precaution, skin  integrity    Interventions:       Speech Treatment: Wife present for session. Treatment targeting recall,  organization, and planning skills in context of med management.       Speech Treatment: SLP prompting pt to access written resources amongst  education binder. Set-up visual aide containing med list and given task of  answering questions on medication elements. 8/8 correct responses achieved w/o  cues. Consulted handout detailing upcoming d/c planning information. 60% acc.  demonstrated in consulting ext. device w/o cues; needed min-mod verbal cues in  order to refrain from guessing. Spontaneously ought notes for attaining  additional information on scope of practice per each discipline 3/3. Practiced  setting up medications using pill box organizer. SLP facilitating utilization of  notes via set-up med list. Proceeded w/ inputing mock pills into corresponding  sections of pill box 6/6 instances w/ only incidental verbal cues for strategy  of double-checking. Independent w/ strategy of counting aloud each time he  completed responses. Post-task analysis revealing better insight into task  difficulty stating "it was hard" and therapist providing education on  alternating attention component.  Patient was left seated in chair in his/her room.  Patient was left seated in chair with family/friends present. Handoff to nurse  completed.  Chair alarm in place and activated.  Oriented to call bell and placed within reach.  Personal items within reach.    ASSESSMENT  Gradually  recognizing difficulty in return to some premorbid tasks and accepting  of wife's assistance in monitoring actions for accuracy. Still needs  reinforcement of safety preacutions and would benefit from additional review of  brain injury impacts on daily routine.    3 Hour Rule Minutes: 60 minutes of SLP treatment this session count towards  intensity and duration of therapy requirement. Patient was seen for the full  scheduled time of SLP treatment this session.  Therapy Mode Minutes: Individual: 60 minutes.    Signed by: Trenda Moots, M.A., CCC/SLP 03/18/2019 2:00:00 PM
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
## 2019-03-18 NOTE — Rehab Progress Note ([facility]) (Signed)
NAMELASALLE ABEE  MRN: [id number]  Account: [id number]  Session Start: 03/18/2019 8:00:00 AM  Session Stop: 03/18/2019 8:00:00 AM    Total Treatment Minutes:  Minutes    Speech Language Pathology  Inpatient Rehabilitation Exception Note    Patient was unable to complete planned therapy session.  0 minutes of SLP treatment this session count towards intensity and duration of  therapy requirement. Patient was not seen for the full scheduled time of SLP  treatment this session. Pt refusing d/t feeling anxious about son's whereabouts  and emotionally unprepared to participate in speech therapy session.  Will attempt to see patient later today, as scheduled.    Signed by: Trenda Moots, M.A., CCC/SLP 03/18/2019 8:00:00 AM

## 2019-03-18 NOTE — Rehab Progress Note ([facility]) (Signed)
NAMEMESSIYAH [name]  MRN: [id number]  Account: [id number]  Session Start: 03/18/2019 9:00:00 AM  Session Stop: 03/18/2019 10:00:00 AM    Total Treatment Minutes: 60.00 Minutes    Physical Therapy  Inpatient Rehabilitation Progress Note    Rehab Diagnosis: TBI  Demographics:            Age: 15Y            Gender: Male  Rehabilitation Precautions/Restrictions:   Bilateral lower extremities: WBAT, Risk for falls. Seizure precaution, skin  integrity    SUBJECTIVE  Patient Report: Pt reports that he had a low key weekend.  Patient/Caregiver Goals:  To go back to my normal state, doing my own things  Pain: Patient currently without complaints of pain.    OBJECTIVE  Vital Signs:                       Before Activity              After Activity  Vitals  Position/Activity    sitting                      sitting  BP Systolic          107                          119  BP Diastolic         69                           75  Pulse                60                           61      Interventions:       Therapeutic Activities:  Pt mass practiced gait with SPC in L UE for 400'  x2. Pt mass practiced 4" 6" curbs with SPC and VCs for sequencing. Pt mass  practiced stairs with R railing 12x2 with VCs for sequencing and safety.  Patient was left seated in chair at nurse's station. Handoff to nurse completed.  Chair alarm in place and activated.  Oriented to call bell and placed within reach.  Personal items within reach.  Assistive devices positioned out of reach.    Pain Reassessment: Pain was not reassessed as no pain was reported.    Lower Extremity Orthosis: Patient does not present with foot drop or ankle  instability and does not need an orthotic.  Equipment Provided: The patient has been given voluntary selection of a medical  equipment provider, as per federal law. Arrangements have been made with a  company of the patient?s choosing. The patient has been educated that choosing a  company outside of [employer]?s preferred  provider may affect their  insurance coverage.The patient has been provided with information on care of  equipment as appropriate. Pt recommended use of a SPC for d/c home    Education Provided:    Education Provided: Fall prevention/balance training. Safety. Gait. Equipment.  Stair/curb/environmental barrier negotiation.       Audience: Patient.       Mode: Explanation.  Demonstration.       Response: Applied knowledge.  Verbalized understanding.  Needs practice.  Needs reinforcement.  No evidence of  learning.    ASSESSMENT  Pt progressing with general mobility but requires VCs for safety and sequencing.  Pt continues to lack insight into current impairments and challenges VCs for  using assistive devices and sequencing for stairs and gait. Pt would benefit  from additional family training for safe d/c home. Pt will continue to benefit  from skilled physical therapy to address current impairments.    CARE Tool  Mobility Functional Assessment    Sit to Stand: 04 - Supervision: Helper provides supervision for safety or verbal  cues ONLY  Assistive Device(s):   Single point cane    Chair/Bed-to-Chair Transfer: 04 - Supervision: Helper provides supervision for  safety or verbal cues ONLY  Assistive Device(s):   Single point cane    Walk 10 Feet: 04 - Touching Assistance: Helper provides  touching/steadying/contact guard   SPC    Walk 50 Feet with Two Turns: 04 - Touching Assistance: Helper provides  touching/steadying/contact guard   SPC    Walk 150 Feet:  04 - Touching Assistance: Helper provides  touching/steadying/contact guard   SPC    Walking 10 Feet on Uneven Surface: 04 - Touching Assistance: Helper provides  touching/steadying/contact guard   SPC    1 Step (curb):  04 - Touching Assistance: Helper provides  touching/steadying/contact guard   SBA R railing    4 Steps:  04 - Touching Assistance: Helper provides touching/steadying/contact  guard   R railing step to pattern    12 Steps: 04 - Supervision: Helper  provides supervision for safety or verbal  cues ONLY   R railing step to pattern      Long Term Goals: Pt will be modI for bed mobility for decreased caregiver  burden. - Goal Not Met  Pt will be able to ambulate >300' with LRAD and modI for household ambulation. -  Goal Not Met  Pt will be able to complete 1 FOS with railing and SPV for safety and access to  basement at d/c. - Goal Not Met  Pt will ambulate outside on uneven surfaces and curbs with SPV for attending MD  appointments at d/c. - Goal Not Met  10-14 days from IE on 03/07/2019  Short Term Goals:    Progress Towards Goals:   LONG TERM GOAL REVIEW:       1. Pt will be modI for bed mobility for decreased caregiver burden. - Met       2. Pt will be able to ambulate >300' with LRAD and modI for household  ambulation. - Not Met recommended SBA at this time       3. Pt will be able to complete 1 FOS with railing and SPV for safety and  access to basement at d/c. - Met       4. Pt will ambulate outside on uneven surfaces and curbs with SPV for  attending MD appointments at d/c. - Not Met: recommended CGA at this time with  [facility]  Time frame to achieve long term goal(s):  10-14 days from IE on 03/07/2019    PLAN  Continued Physical Therapy is recommended.  Recommended Frequency/Duration/Intensity: 1-2 hours/day, 5-6 days/week 10-14  days from IE on 03/07/2019  Activities Contributing Toward Care Plan: TE/TA, balance training, gait  activities, neuro-muscular re-ed, stairs, patient/family training, d/c lplaning,  procurement of DME, etc  The patient is not appropriate for group treatment.    Care Plan  Identified problems from team documentation:  Problem: Impaired Cardiac Function  Cardiac: Primary  Team Goal: Systolic blood pressure will be at or below 150 via  medications, diet,and improved activity level 100% by d/c/Active    Problem: Impaired Cognition  Cognition: Primary Team Goal: Pt will utilize internal/external compensatory  memory strategies to support new  learning of precautions, medical management, as  well as brain injury education w/ only min-mod verbal cues from  staff/caregiver./Active    Problem: Impaired Mobility  Mobility: Primary Team Goal: Pt will be modI for bed mobility, and ambulation  with LRAD and SPV for stairs to return home safely./Active    Problem: Impaired Pain Management  Pain Mgmt: Primary Team Goal: Patient will  be able to participate in therapy  with pain level 2/10 prior to medicated for pain 100% by d/c/Active    Problem: Impaired Psychosocial Skills/Behavior  PsychoSocial: Primary Team Goal: Patient will regularly employ adaptive coping  strategies to assist with mood regulation. [name]    Problem: Impaired Self-care Mgmt/ADL/IADL  Self Care: Primary Team Goal: Pt's caregiver will provide recommended  environment, and supervision in order for pt to complete self are tasks at mod  I/supervision level and return home safely./Active    Problem: Safety Risk and Restraint  Safety: Primary Team Goal: Patient will recognize limitations and call for  assiaatance before attempting mobility 100% of the time in order to prevent  falls at home/Active    Add/Update Problems from this Treatment:  Update of existing problem:   Mobility: Pt can ambulate >300' with SPC and SBA-CGA    Discipline:  Physical Therapy    Please review Integrated Patient View Care Plan Flowsheet for Team identified  Problems, Interventions, and Goals.    3 Hour Rule Minutes: 60 minutes of PT treatment this session count towards  intensity and duration of therapy requirement. Patient was seen for the full  scheduled time of PT treatment this session.  Therapy Mode Minutes: Individual: 60 minutes.    Signed by: Lorin Glass, DPT, PT 03/18/2019 10:00:00 AM

## 2019-03-18 NOTE — Rehab Progress Note ([facility]) (Signed)
Jonathan [name]  MRN: [id number]  Account: [id number]  Session Start: 03/18/2019 12:00:00 AM  Session Stop: 03/18/2019 12:00:00 AM    Total Treatment Minutes:  Minutes    Rehabilitation Nursing  Inpatient Rehabilitation Shift Assessment    Rehab Diagnosis: TBI  Demographics:            Age: 64Y            Gender: Male  Primary Language: English    Date of Onset:  02/22/19  Date of Admission: 03/06/2019 12:57:06 PM    Rehabilitation Precautions Restrictions:   Bilateral lower extremities: WBAT, Risk for falls. Seizure precaution, skin  integrity    Patient Report: "I'm tready to go home"  Patient/Caregiver Goals:  To go back to my normal state, doing my own things    Wounds/Incisions: No wounds or incisions.    Medication Review: No clinically significant medication issues identified this  shift.    Bowel and Bladder Output:                       Bladder (# only)             Bowel (# only)  Number of Episodes  Continent            3                            0  Incontinent          0                            0    Education Provided:    Education Provided: Safety issues and interventions. Fall protocol. Altered  mental status. Impaired vision. Impulsivity. Supervision requirements. Use of  adaptive devices. Functional transfers. Medication. Name and dosage.  Administration. Purpose. Side Effects. Cardiac. Monitoring heart rate. Plan of  care.       Audience: Patient.       Mode: Explanation.       Response: Verbalized understanding.  Needs practice.  Needs reinforcement.    Long Term Goals: 1. Patient will  be able to participate in therapy with pain  level 2/10 prior to medicated for pain 100% by d/c - Goal Met   2. Patient will recognize limitations and call for assiaatance before  attempting mobility 100% of the time in order to prevent falls at home - Goal  Not Met   3. Systolic blood pressure will be at or below 150 via medications, diet,and  improved activity level 100% by d/c  Two weeks  Short Term Goals: 1.  Patient will  be able to participate in therapy with pain  level 2/10 prior to medicated for pain 50% by d/c - Goal Met   2. Patient will recognize limitations and call for assiaatance before  attempting mobility 50% of the time in order to prevent falls at home - Goal Not  Met   3. Systolic blood pressure will be at or below 150 via medications, diet,and  improved activity level 50% by d/c - Goal Not Met   4. Patient's bradychardia will improve to normal level in order to discontinue  telemtry.  One week    PROGRESS TOWARD GOALS: Risk of falls/injuries.  Intervention(s): Teach patient/ family to evaluate situation and environment for  hazards, Medication review, Fall risk assessment using EBP tool, Use of chair  and/or bed alarms, Make sure call light, personal belongings within the  patient?s reach, Recognize, assist, and anticipate basic needs, Establish and/or  assist with regular toileting program, Provide distraction, direct attention to  other activities, Frequent safety check, rounding, Use of non-skid footwear,  Maintain adequate lighting, Reinforce safety instruction for transfer  techniques, gait training and use of mobility devices recommended by PT/OT,  Encourage and/assist with safe use of assistive devices during ambulation, Allow  patient and/family to identify safety problems and environmental hazards at home  and determine actions to prevent accidents/ injuries, Allow patient and/or  family to demo learned strategies to prevent falls/injuries    Response to intervention(s): Discussed with patient and spouse about the  importance of having slip resistance footwear on at all times when out of bed.  Patient is impulsive and believes they are stronger than they really are.    Ongoing Education/Training: Continue to educate patient and spouse on patient  safety. Evaluate teach back of at least 3 ways to decrease the risk of falls  when at home.    PLAN:  Rehab Nursing Specific Interventions:  Continue  with the current Nursing Plan of Care.    TEAM CARE PLAN  Identified problems from team documentation:  Problem: Impaired Cardiac Function  Cardiac: Primary Team Goal: Systolic blood pressure will be at or below 150 via  medications, diet,and improved activity level 100% by d/c/Active    Problem: Impaired Cognition  Cognition: Primary Team Goal: Pt will utilize internal/external compensatory  memory strategies to support new learning of precautions, medical management, as  well as brain injury education w/ only min-mod verbal cues from  staff/caregiver./Active    Problem: Impaired Mobility  Mobility: Primary Team Goal: Pt will be modI for bed mobility, and ambulation  with LRAD and SPV for stairs to return home safely./Active    Problem: Impaired Pain Management  Pain Mgmt: Primary Team Goal: Patient will  be able to participate in therapy  with pain level 2/10 prior to medicated for pain 100% by d/c/Active    Problem: Impaired Psychosocial Skills/Behavior  PsychoSocial: Primary Team Goal: Patient will regularly employ adaptive coping  strategies to assist with mood regulation. [name]    Problem: Impaired Self-care Mgmt/ADL/IADL  Self Care: Primary Team Goal: Pt's caregiver will provide recommended  environment, and supervision in order for pt to complete self are tasks at mod  I/supervision level and return home safely./Active    Problem: Safety Risk and Restraint  Safety: Primary Team Goal: Patient will recognize limitations and call for  assiaatance before attempting mobility 100% of the time in order to prevent  falls at home/Active    Please review Integrated Patient View Care Plan Flowsheet for Team identified  Problems, Interventions, and Goals.    Signed by: Rubye Beach, RN 03/18/2019 1:01:00 PM

## 2019-03-18 NOTE — Progress Notes (Addendum)
Home Health Referral         Referral from Tiffany Daphine Deutscher (Case Manager) for home health care upon discharge.    By [employer], the patient has the right to freely choose a home care provider.  Arrangements have been made with:     A company of the patients choosing. We have supplied the patient with a listing of providers in your area who asked to be included and participate in Medicare.   [facility], formerly [facility], a home care agency that provides adult home care services and participates in Medicare   The preferred provider of your insurance company. Choosing a home care provider other than your insurance company's preferred provider may affect your insurance coverage.    Home Health Discharge Information     Your doctor has ordered Skilled Nursing, Physical Therapy, Occupational Therapy and Speech and Language Therapy in-home service(s) for you while you recuperate at home, to assist you in the transition from hospital to home.    The agency that you or your representative chose to provide the service:  Name of Home Health Agency Placement: [facility]] [phone]  Start of Care: 8/20     The above services were set up by:  Ashok Cordia  [facility] Liaison)   Phone  [phone]        IF YOU HAVE NOT HEARD FROM YOUR HOME YOUR HOME HEALTH AGENCY WITHIN 24-48 HOURS AFTER DISCHARGE PLEASE CALL YOUR AGENCY TO ARRANGE A TIME FOR YOUR FIRST VISIT. FOR ANY SCHEDULING CONCERNS OR QUESTIONS RELATED TO HOME HEALTH, SUCH AS TIME OR DATE PLEASE CONTACT YOUR HOME HEALTH AGENCY AT THE NUMBER LISTED ABOVE.    Additional comments: This Clinical research associate spoke with the patient's spouse Jonathan Dandy regarding home health. She is agreeable to recommended skilled care services with [facility]. She has requested a list of home health agencies which was provided. Demographics/PCP verified. [facility] confirmed SOC 8/20.    HOME HEALTH REFERRAL    PATIENT'S DEMOGRAPHICS:  Name: Jonathan [name]  Discharge Address: [address]  [place] [address]  Primary Telephone Number:  [phone]  Secondary Telephone Number: [phone]  Emergency Contact and Number: Extended Emergency Contact Information  Primary Emergency Contact: [facility]  Address: [address]           [place], [place] [address] [place]  Home Phone: [phone]  Mobile Phone: [phone]  Relation: Spouse    Ordering Physician: Leretha Dykes, MD    Following Physician:   PCP: Emelda Brothers, MD, [id number]    Agreeable to Follow: Yes  Date/Time of Call: 8/17 3:13    Language/Communication Barrier:  English/ No    Primary Diagnosis and Reason for Services:  TBI (traumatic brain injury); Atrial fibrillation; H/O sick sinus syndrome; Cardiac pacemaker in situ--leadless nano stim      [employer] (Labs, Wounds, Infusions, etc.): Draw PT/INR via venipuncture or fingerstick (day after hospital discharge or date 8/21) with results to Dr. Geradine Girt phone [phone]/fax [phone] and obtain further orders.    Additional Comments:  NO SOC CALL IS NEEDED    Precautions  Fall precautions  Skin and pressure ulcer precautions  Seizure precautions  BLEEDING PRECAUTIONS    Questions Relating to COVID-19:  1. Have you been out of the country in the past 2 weeks?  no  2. Do you have any upper respiratory symptoms (cough, SOB, fever)?  no  3. Have you been  exposed to anyone with COVID-19 virus? no  Answer only if pending or positive for  COVID-19: NA  1. Agreeable to wear PPE at each visit?   2.   Is the hospital supplying them with PPE upon discharge?  No    Discharge Date: 8/19    Referral Source (PACC/Hospital/Unit): Ashok Cordia, RN    Referral Date: 03/18/19    Home Health face-to-face (FTF) Encounter (Order [id number])   Consult   Date: 03/18/2019 Department: [facility] Ordering/Authorizing: Leretha Dykes, MD   Order Information     Order Date/Time Release Date/Time Start Date/Time End Date/Time   03/18/19  03:34 PM None 03/18/19 03:32 PM 03/18/19 03:32 PM   Order Details     Frequency Duration Priority Order Class   Once 1 occurrence Routine Hospital Performed   Standing Order Information     Remaining Occurrences Interval Last Released     0/1 Once 03/18/2019           Provider Information     Ordering User Ordering Provider Authorizing Provider   Ashok Cordia, RN Leretha Dykes, MD Leretha Dykes, MD   Attending Provider(s) Admitting Provider PCP   Leretha Dykes, MD; Cathe Mons, MD Leretha Dykes, MD Emelda Brothers, MD   Verbal Order Info     Action Created on Order Mode Entered by Responsible Provider Signed by Signed on   Ordering 03/18/19 1534 Telephone with Cindra Presume, RN Leretha Dykes, MD     Comments     Physician to follow in the Community: Geradine Girt    Diagnosis: TBI (traumatic brain injury); Atrial fibrillation; H/O sick sinus syndrome; Cardiac pacemaker in situ--leadless nano stim     Draw PT/INR via venipuncture or fingerstick (day after hospital discharge or date 8/21) with results to Dr. Geradine Girt phone [phone]/fax [phone] and obtain further orders.    Home nursing required for skilled assessment including cardiopulmonary assessment and dietary education for disease management, and medication instruction. Home PT/OT required for gait and balance training, strengthening, mobility, fall prevention, and ADL training. Home SLP required for swallowing techniques and aspiration prevention.         Order Questions     Question Answer Comment   Date of face-to-face (FTF) encounter: 03/18/2019    Medical conditions that necessitate Home Health care: C. Risk for complication/infection/pain requiring follow up and monitoring     F. New diagnosis & treatment requiring follow up monitoring and management     G. High risk/complex medications requiring instruction and management     H. Multiple new medications requiring management and monitoring    Clinical  findings that support the need for Skilled Nursing. SN will: C. Monitor for signs and symptoms of exacerbation of disease and management     D. Review medication reconciliation, manage and educate on use and side effects     G. Educate on new diagnosis, treatment & management to prevent re-hospitalization     H. Assess cardiopulmonary status and monitor for signs &symptoms of exacerbation     I. Educate dietary and or fluid restrictions and weight management    Clinical findings that support the need for Physical Therapy. PT will A. Evaluate and treat functional impairment and improve mobility     C. Educate on weight bearing status, stair/gait training, balance & coordination     D. Provide services to help restore function, mobility, and releive pain  E. Educate on functional mobility; bed, chair, sit, stand and transfer activities     F. Perform home safety assessment & develop safe in home exercise program     G. Implement activities to improve stance time, cadence & step length     H. Educate on the safe use of assistive device/ durable medical equipment     I. Instruct on restorative activities to restore ability to perform ADL    Clinical findings support the need for OT (needs SN/PT order).OT will A. Develop in home program to improve ability to perform ADLs     B. Develop restorative program to improve mobility and independence     C. Educate on recovery and maintenance skills     D. Instruct on strategies to compensate for loss of function     E. Educate on basic motor function and reasoning abilities    Clinical findings that support the need for SLP. ST will A. Evaluate & treat speech, language, cognitive, communication & swallowing impair    Per clinical findings, following services are medically necessary: Skilled Nursing     OT     PT     Speech Language Pathology    Evidence this patient is homebound because: B. Profound weakness, poor balance/unsteady gait d/t  illness/treatment/procedure     N. Impaired mobility d/t pain, arthritis, weakness that compromises patient safety     L. Transfer/ambulation requires stand by assist and verbal cues to perform safely     G. Fall risk due to impaired coordination, gait and decreased balance          Process Instructions     Please select Home Care Services medically necessary.     Based on the above findings, I certify that this patient is confined to the home and needs intermittent skilled nursing care, physical therapry and / or speech therapy or continues to need occupational therapy. The patient is under my care, and I have initiated the establishment of the plan of care. This patient will be followed by a physician who will periodically review the plan of care.    Collection Information     Consult Order Info     ID Description Priority Start Date Start Time   [id number] Home Health face-to-face (FTF) Encounter Routine 03/18/2019 3:32 PM   Provider Specialty Referred to   ______________________________________ _____________________________________   Acknowledgement Info     For At Acknowledged By Acknowledged On   Placing Order 03/18/19 1534 Ashok Cordia, RN 03/18/19 1534   Verbal Order Info     Action Created on Order Mode Entered by Responsible Provider Signed by Signed on   Ordering 03/18/19 1534 Telephone with Cindra Presume, RN Leretha Dykes, MD     Patient Information     Patient Name  Praise, Dolecki Sex  Male DOB  May 28, 1951   Additional Information     Associated Reports External References   Priority and Order Details [employer]     Jonathan Pouch, RN  Post Acute Care Coordinator  [place]   [phone]  Office  [phone]  Mobile

## 2019-03-18 NOTE — Progress Notes (Signed)
Patient is impulsive and tried to get out of bed without assistance. In the process of getting out of bed, patients lidocaine patch fell off. Patient refused to put patch back on. Night shift nurse informed.
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
## 2019-03-18 NOTE — Rehab Progress Note ([facility]) (Signed)
Jonathan [name]  MRN: [id number]  Account: [id number]  Session Start: 03/18/2019 3:00:00 PM  Session Stop: 03/18/2019 4:00:00 PM    Total Treatment Minutes: 60.00 Minutes    Psychology Services  Inpatient Rehabilitation Progress Note    Rehab Diagnosis: TBI  Demographics:            Age: 27Y            Gender: Male    Medications and Allergies: Significant rehabilitation considerations:   NKA  Rehabilitation Precautions/Restrictions:   Bilateral lower extremities: WBAT, Risk for falls. Seizure precaution, skin  integrity      General Observations and Mental Status: Jonathan [name] was awake, alert, and  cooperative throughout the session. He presented sitting upright in his hospital  bed resting comfortably. Affect was broad and topic congruent over the course of  the evaluation. Eye contact was good. Thought process was linear and focused;  content was appropriate. Speech was of normal volume, rate, and prosody.  Processing speed was within normal limits. There were no behavioral signs of  pain during the assessment. Patient was oriented to person, place, time, and  situation. 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 Mood was mildly anxious. There was no evidence of response  to auditory/visual stimuli or apparent psychosis. Patient denies any suicidal  ideation, intent, or plan.      Education Provided:    Education Provided: Adaptive coping skills and TBI education. .       Audience: Patient.       Mode: Explanation.       Response: Verbalized understanding.  Needs reinforcement.    Interventions:   NPSY INDIVID HLTH INTERV INT   NPSY INDIVID HLTH INTERV ADD    ASSESSMENT  Impressions:  Met with patient and family to provide support and education to  assist with the adjustment/coping process. Discussed patient's successes and  functional gains during his rehab stay. Offered support, reassurance, and  assisted with problem-solving around methods which could be  employed for the  continual emotional support of the patient upon discharging home. Overall  patient and family were open and receptive to supportive counsel and education.    PLAN  Continued Psychology services are recommended to address: Emotional adjustment  to current medical status and recent injury.    Recommendations:  Individual counseling and co-treatment sessions as needed.    Signed by: Lamar Blinks, PhD 03/18/2019 3:00:00 PM

## 2019-03-18 NOTE — Rehab Progress Note ([facility]) (Addendum)
Corrected 03/18/2019 12:55:09 PM    NAME: Jonathan [name]  MRN: [id number]  Account: [id number]  Session Start: 03/18/2019 12:00:00 AM  Session Stop: 03/18/2019 12:00:00 AM    Total Treatment Minutes:  Minutes    Rehabilitation Nursing  Inpatient Rehabilitation Shift Assessment    Rehab Diagnosis: TBI  Demographics:            Age: 14Y            Gender: Male  Primary Language: English    Date of Onset:  02/22/19  Date of Admission: 03/06/2019 12:57:06 PM    Rehabilitation Precautions Restrictions:   Bilateral lower extremities: WBAT, Risk for falls. Seizure precaution, skin  integrity    Patient Report: "I'm ready to go home"  Patient/Caregiver Goals:  To go back to my normal state, doing my own things    Wounds/Incisions: No wounds or incisions.    Medication Review: No clinically significant medication issues identified this  shift.    Bowel and Bladder Output:                       Bladder (# only)             Bowel (# only)  Number of Episodes  Continent            4                            0  Incontinent          0                            0    Education Provided:    Education Provided: Safety issues and interventions. Fall protocol. Impaired  coordination. Impulsivity. Impaired vision. Supervision requirements. Use of  adaptive devices. Functional transfers. Medication. Name and dosage.  Administration. Purpose. Side Effects. Plan of care.       Audience: Patient and significant other.       Mode: Explanation.       Response: Verbalized understanding.  Needs practice.  Needs reinforcement.    Long Term Goals: 1. Patient will  be able to participate in therapy with pain  level 2/10 prior to medicated for pain 100% by d/c   2. Patient will recognize limitations and call for assiaatance before  attempting mobility 100% of the time in order to prevent falls at home - Goal  Not Met   3. Systolic blood pressure will be at or below 150 via medications, diet,and  improved activity level 100% by d/c  Two weeks  Short Term  Goals: 1. Patient will  be able to participate in therapy with pain  level 2/10 prior to medicated for pain 50% by d/c - Goal Not Met   2. Patient will recognize limitations and call for assiaatance before  attempting mobility 50% of the time in order to prevent falls at home - Goal Not  Met   3. Systolic blood pressure will be at or below 150 via medications, diet,and  improved activity level 50% by d/c - Goal Not Met  One week    PROGRESS TOWARD GOALS: Medication management. Assess patient/caregiver  understanding of medication administration steps, Provide patient/caregiver with  updated medication administration record (M.A.R), Provide hands-on training to  patient/caregiver regarding medication administration, Evaluate teach-back of  medication administration by patient/caregiver  Response to Medication Management  Interventions: Discussed medications with  patient and spouse. Patient googled each medication to teach back what the  purpose for each medication is.  Focus of Ongoing Education and Training: Continue to evaluate teach back with  patient until patient can teach back 100%.     SHORT TERM GOAL REVIEW:       1. Patient will  be able to participate in therapy with pain level 2/10  prior to medicated for pain 50% by d/c - Met  Time frame to achieve short term goal(s):  One week   LONG TERM GOAL REVIEW:       1. Patient will  be able to participate in therapy with pain level 2/10  prior to medicated for pain 100% by d/c - Met  Timeframe to achieve long term goal(s):  Two weeks    PLAN:  Rehab Nursing Specific Interventions:  Continue with the current Nursing Plan of Care.    TEAM CARE PLAN  Identified problems from team documentation:  Problem: Impaired Cardiac Function  Cardiac: Primary Team Goal: Systolic blood pressure will be at or below 150 via  medications, diet,and improved activity level 100% by d/c/Active    Problem: Impaired Cognition  Cognition: Primary Team Goal: Pt will utilize  internal/external compensatory  memory strategies to support new learning of precautions, medical management, as  well as brain injury education w/ only min-mod verbal cues from  staff/caregiver./Active    Problem: Impaired Mobility  Mobility: Primary Team Goal: Pt will be modI for bed mobility, and ambulation  with LRAD and SPV for stairs to return home safely./Active    Problem: Impaired Pain Management  Pain Mgmt: Primary Team Goal: Patient will  be able to participate in therapy  with pain level 2/10 prior to medicated for pain 100% by d/c/Active    Problem: Impaired Psychosocial Skills/Behavior  PsychoSocial: Primary Team Goal: Patient will regularly employ adaptive coping  strategies to assist with mood regulation. [name]    Problem: Impaired Self-care Mgmt/ADL/IADL  Self Care: Primary Team Goal: Pt's caregiver will provide recommended  environment, and supervision in order for pt to complete self are tasks at mod  I/supervision level and return home safely./Active    Problem: Safety Risk and Restraint  Safety: Primary Team Goal: Patient will recognize limitations and call for  assiaatance before attempting mobility 100% of the time in order to prevent  falls at home/Active    Please review Integrated Patient View Care Plan Flowsheet for Team identified  Problems, Interventions, and Goals.    Signed by: Rubye Beach, RN 03/15/2019 12:35:00 PM

## 2019-03-18 NOTE — Rehab Progress Note ([facility]) (Signed)
Jonathan [name]  MRN: [id number]  Account: [id number]  Session Start: 03/18/2019 12:00:00 AM  Session Stop: 03/18/2019 12:00:00 AM    Total Treatment Minutes:  Minutes    Rehabilitation Nursing  Inpatient Rehabilitation Shift Assessment    Rehab Diagnosis: TBI  Demographics:            Age: 79Y            Gender: Male  Primary Language: English    Date of Onset:  02/22/19  Date of Admission: 03/06/2019 12:57:06 PM    Rehabilitation Precautions Restrictions:   Bilateral lower extremities: WBAT, Risk for falls. Seizure precaution, skin  integrity    Patient Report: I want to use the bathroom  Patient/Caregiver Goals:  To go back to my normal state, doing my own things    Wounds/Incisions:    Medication Review: No clinically significant medication issues identified this  shift.    Bowel and Bladder Output:                       Bladder (# only)             Bowel (# only)  Number of Episodes  Continent            4                            0  Incontinent          0                            0    Education Provided:    Education Provided: Precautions. Safety issues and interventions. Supervision  requirements. Fall protocol. Pain management. Medication options. Side effects.  Safety issues and interventions. Supervision requirements.       Audience: Patient.       Mode: Explanation.       Response: Needs reinforcement.    Long Term Goals: 1. Patient will  be able to participate in therapy with pain  level 2/10 prior to medicated for pain 100% by d/c   2. Patient will recognize limitations and call for assiaatance before  attempting mobility 100% of the time in order to prevent falls at home - Goal  Not Met   3. Systolic blood pressure will be at or below 150 via medications, diet,and  improved activity level 100% by d/c  Two weeks  Short Term Goals: 1. Patient will  be able to participate in therapy with pain  level 2/10 prior to medicated for pain 50% by d/c - Goal Met   2. Patient will recognize limitations and call for  assiaatance before  attempting mobility 50% of the time in order to prevent falls at home - Goal Not  Met   3. Systolic blood pressure will be at or below 150 via medications, diet,and  improved activity level 50% by d/c - Goal Not Met   4. Patient's bradychardia will improve to normal level in order to discontinue  telemtry.  One week    PROGRESS TOWARD GOALS: Risk of falls/injuries.  Intervention(s): Teach patient/ family to evaluate situation and environment for  hazards, Medication review, Use of chair and/or bed alarms, Make sure call  light, personal belongings within the patient?s reach, Recognize, assist, and  anticipate basic needs, Establish and/or assist with regular toileting program,  Frequent safety check, rounding, Use  of non-skid footwear, Maintain adequate  lighting, Reinforce safety instruction for transfer techniques, gait training  and use of mobility devices recommended by PT/OT    Response to intervention(s): Patient is still impulsive and is beingn monitored  with AVS    Ongoing Education/Training: Continue with AVS monitor and respond to alarms on  time to prevent falls    PLAN:  Rehab Nursing Specific Interventions:  Continue with the current Nursing Plan of Care.    TEAM CARE PLAN  Identified problems from team documentation:  Problem: Impaired Cardiac Function  Cardiac: Primary Team Goal: Systolic blood pressure will be at or below 150 via  medications, diet,and improved activity level 100% by d/c/Active    Problem: Impaired Cognition  Cognition: Primary Team Goal: Pt will utilize internal/external compensatory  memory strategies to support new learning of precautions, medical management, as  well as brain injury education w/ only min-mod verbal cues from  staff/caregiver./Active    Problem: Impaired Mobility  Mobility: Primary Team Goal: Pt will be modI for bed mobility, and ambulation  with LRAD and SPV for stairs to return home safely./Active    Problem: Impaired Pain Management  Pain  Mgmt: Primary Team Goal: Patient will  be able to participate in therapy  with pain level 2/10 prior to medicated for pain 100% by d/c/Active    Problem: Impaired Psychosocial Skills/Behavior  PsychoSocial: Primary Team Goal: Patient will regularly employ adaptive coping  strategies to assist with mood regulation. Jonathan [name]    Problem: Impaired Self-care Mgmt/ADL/IADL  Self Care: Primary Team Goal: Pt's caregiver will provide recommended  environment, and supervision in order for pt to complete self are tasks at mod  I/supervision level and return home safely./Active    Problem: Safety Risk and Restraint  Safety: Primary Team Goal: Patient will recognize limitations and call for  assiaatance before attempting mobility 100% of the time in order to prevent  falls at home/Active    Please review Integrated Patient View Care Plan Flowsheet for Team identified  Problems, Interventions, and Goals.    Signed by: Samule Dry, RN 03/18/2019 1:31:00 AM

## 2019-03-19 ENCOUNTER — Inpatient Hospital Stay: Payer: Medicare Other

## 2019-03-19 LAB — BASIC METABOLIC PANEL
Anion Gap: 10 (ref 5.0–15.0)
BUN: 12 mg/dL (ref 9–28)
CO2: 27 mEq/L (ref 22–29)
Calcium: 10 mg/dL (ref 8.5–10.5)
Chloride: 98 mEq/L — ABNORMAL LOW (ref 100–111)
Creatinine: 0.8 mg/dL (ref 0.7–1.3)
Glucose: 107 mg/dL — ABNORMAL HIGH (ref 70–100)
Potassium: 4.8 mEq/L (ref 3.5–5.1)
Sodium: 135 mEq/L — ABNORMAL LOW (ref 136–145)

## 2019-03-19 LAB — CBC
Absolute NRBC: 0 10*3/uL (ref 0.00–0.00)
Hematocrit: 38 % (ref 37.6–49.6)
Hgb: 12.9 g/dL (ref 12.5–17.1)
MCH: 29.7 pg (ref 25.1–33.5)
MCHC: 33.9 g/dL (ref 31.5–35.8)
MCV: 87.6 fL (ref 78.0–96.0)
MPV: 8.9 fL (ref 8.9–12.5)
Nucleated RBC: 0 /100 WBC (ref 0.0–0.0)
Platelets: 372 10*3/uL — ABNORMAL HIGH (ref 142–346)
RBC: 4.34 10*6/uL (ref 4.20–5.90)
RDW: 14 % (ref 11–15)
WBC: 8.77 10*3/uL (ref 3.10–9.50)

## 2019-03-19 LAB — PT/INR
PT INR: 1.2 — ABNORMAL HIGH (ref 0.9–1.1)
PT: 15 s (ref 12.6–15.0)

## 2019-03-19 LAB — GFR: EGFR: >60
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
## 2019-03-19 NOTE — Rehab Progress Note ([facility]) (Signed)
NAMEJAVI [name]  MRN: [id number]  Account: [id number]  Session Start: 03/19/2019 12:00:00 AM  Session Stop: 03/19/2019 12:00:00 AM    Total Treatment Minutes:  Minutes    Rehabilitation Nursing  Inpatient Rehabilitation Shift Assessment    Rehab Diagnosis: TBI  Demographics:            Age: 75Y            Gender: Male  Primary Language: English    Date of Onset:  02/22/19  Date of Admission: 03/06/2019 12:57:06 PM    Rehabilitation Precautions Restrictions:   Bilateral lower extremities: WBAT, Risk for falls. Seizure precaution, skin  integrity    Patient Report: "I'm ready to go home tomorrow"  Patient/Caregiver Goals:  To go back to my normal state, doing my own things    Wounds/Incisions: No wounds or incisions.    Medication Review: No clinically significant medication issues identified this  shift.    Bowel and Bladder Output:                       Bladder (# only)             Bowel (# only)  Number of Episodes  Continent            3                            1  Incontinent          0                            0    Education Provided:    Education Provided: Safety issues and interventions. Fall protocol. Impulsivity.  Impaired coordination. Supervision requirements. Use of adaptive devices. Pain  management. Pain scale. Medication options. Side effects. Medication. Name and  dosage. Administration. Purpose. Side Effects. Plan of care.       Audience: Patient and significant other.       Mode: Explanation.       Response: Verbalized understanding.  Needs practice.  Needs reinforcement.    Long Term Goals: 1. Patient will  be able to participate in therapy with pain  level 2/10 prior to medicated for pain 100% by d/c - Goal Met   2. Patient will recognize limitations and call for assiaatance before  attempting mobility 100% of the time in order to prevent falls at home - Goal  Not Met   3. Systolic blood pressure will be at or below 150 via medications, diet,and  improved activity level 100% by d/c  Two  weeks  Short Term Goals: 1. Patient will  be able to participate in therapy with pain  level 2/10 prior to medicated for pain 50% by d/c - Goal Met   2. Patient will recognize limitations and call for assiaatance before  attempting mobility 50% of the time in order to prevent falls at home - Goal Not  Met   3. Systolic blood pressure will be at or below 150 via medications, diet,and  improved activity level 50% by d/c - Goal Not Met   4. Patient's bradychardia will improve to normal level in order to discontinue  telemtry.  One week    PROGRESS TOWARD GOALS: Risk of falls/injuries.  Intervention(s): Teach patient/ family to evaluate situation and environment for  hazards, Medication review, Fall risk assessment using EBP tool, Use  of chair  and/or bed alarms, Make sure call light, personal belongings within the  patient?s reach, Recognize, assist, and anticipate basic needs, Establish and/or  assist with regular toileting program, Provide distraction, direct attention to  other activities, Frequent safety check, rounding, Use of non-skid footwear,  Maintain adequate lighting, Reinforce safety instruction for transfer  techniques, gait training and use of mobility devices recommended by PT/OT,  Encourage and/assist with safe use of assistive devices during ambulation, Allow  patient and/family to identify safety problems and environmental hazards at home  and determine actions to prevent accidents/ injuries, Allow patient and/or  family to demo learned strategies to prevent falls/injuries    Response to intervention(s): Patient verbalized 2 ways to help prevent falls at  home.    Ongoing Education/Training: Continue to constantly remind patient of safety  precautions to help prevent falls.    PLAN:  Rehab Nursing Specific Interventions:  Continue with the current Nursing Plan of Care.    TEAM CARE PLAN  Identified problems from team documentation:  Problem: Impaired Bladder Management    Problem: Impaired Bowel  Management    Problem: Impaired Cardiac Function  Cardiac: Primary Team Goal: Systolic blood pressure will be at or below 150 via  medications, diet,and improved activity level 100% by d/c/Active    Problem: Impaired Cognition  Cognition: Primary Team Goal: Pt will utilize internal/external compensatory  memory strategies to support new learning of precautions, medical management, as  well as brain injury education w/ only min-mod verbal cues from  staff/caregiver./Met    Problem: Impaired Mobility  Mobility: Primary Team Goal: Pt will be modI for bed mobility, and ambulation  with LRAD and SPV for stairs to return home safely./Not Met    Problem: Impaired Pain Management  Pain Mgmt: Primary Team Goal: Patient will  be able to participate in therapy  with pain level 2/10 prior to medicated for pain 100% by d/c/Active    Problem: Impaired Psychosocial Skills/Behavior  PsychoSocial: Primary Team Goal: Patient will regularly employ adaptive coping  strategies to assist with mood regulation. Jonathan [name]    Problem: Impaired Self-care Mgmt/ADL/IADL  Self Care: Primary Team Goal: Pt's caregiver will provide recommended  environment, and supervision in order for pt to complete self are tasks at mod  I/supervision level and return home safely./Active    Problem: Safety Risk and Restraint  Safety: Primary Team Goal: Patient will recognize limitations and call for  assiaatance before attempting mobility 100% of the time in order to prevent  falls at home/Active    Please review Integrated Patient View Care Plan Flowsheet for Team identified  Problems, Interventions, and Goals.    Signed by: Rubye Beach, RN 03/19/2019 5:32:00 PM

## 2019-03-19 NOTE — Rehab Progress Note ([facility]) (Signed)
[name]  MRN: [id number]  Account: [id number]  Session Start: 03/19/2019 12:00:00 AM  Session Stop: 03/19/2019 12:00:00 AM    Total Treatment Minutes:  Minutes    Rehabilitation Nursing  Inpatient Rehabilitation Shift Assessment    Rehab Diagnosis: TBI  Demographics:            Age: 55Y            Gender: Male  Primary Language: English    Date of Onset:  02/22/19  Date of Admission: 03/06/2019 12:57:06 PM    Rehabilitation Precautions Restrictions:   Bilateral lower extremities: WBAT, Risk for falls. Seizure precaution, skin  integrity    Patient Report: " I am still having pain to my right  rib cage and   shoulder, I  think ice  pack will work"  Patient/Caregiver Goals:  To go back to my normal state, doing my own things    Wounds/Incisions: No wounds or incisions.    Medication Review: No clinically significant medication issues identified this  shift.    Bowel and Bladder Output:                       Bladder (# only)             Bowel (# only)  Number of Episodes  Continent            3                            0  Incontinent          0                            0    Education Provided:    Education Provided: Pain management. Pain scale. Medication options. Side  effects. Clinical indicators of pain. Precautions. Medication. Name and dosage.  Administration. Purpose. Side Effects.       Audience: Patient.       Mode: Explanation.  Demonstration.       Response: Needs practice.  Needs reinforcement.    Long Term Goals: 1. Patient will  be able to participate in therapy with pain  level 2/10 prior to medicated for pain 100% by d/c - Goal Met   2. Patient will recognize limitations and call for assiaatance before  attempting mobility 100% of the time in order to prevent falls at home - Goal  Not Met   3. Systolic blood pressure will be at or below 150 via medications, diet,and  improved activity level 100% by d/c  Two weeks  Short Term Goals: 1. Patient will  be able to participate in therapy with  pain  level 2/10 prior to medicated for pain 50% by d/c - Goal Met   2. Patient will recognize limitations and call for assiaatance before  attempting mobility 50% of the time in order to prevent falls at home - Goal Not  Met   3. Systolic blood pressure will be at or below 150 via medications, diet,and  improved activity level 50% by d/c - Goal Not Met   4. Patient's bradychardia will improve to normal level in order to discontinue  telemtry.  One week    PROGRESS TOWARD GOALS: Pain management.  Interventions: Provide health teachings on pain monitoring using numeric scale,  Use of clinical indicators to monitor pain level, Explain medications ordered  for pain management, Determine etiology of pain, Evaluate, assess outcomes,  and/or effectiveness of pain management, Assist patient to identify other/or  non- pharmacologic measures to alleviate pain (e.g., positioning, ROM exercises,  splinting, slow stretching exercises), Reinforce the use of alternative therapy,  treatments and/or other pain modalities recommended by MD/PT/OT.    Response to intervention(s): Ice pack together with prn pain medication were  effective to alleviate patient pain to the right rib cage  and the shoulder.    Ongoing Education/Training: Continue with phamacological and non phamacological  measures for patient pain contol.     SHORT TERM GOAL REVIEW:  Time frame to achieve short term goal(s):  One week   LONG TERM GOAL REVIEW:  Timeframe to achieve long term goal(s):  Two weeks    PLAN:  Rehab Nursing Specific Interventions:  Continue with the current Nursing Plan of Care.    TEAM CARE PLAN  Identified problems from team documentation:  Problem: Impaired Cardiac Function  Cardiac: Primary Team Goal: Systolic blood pressure will be at or below 150 via  medications, diet,and improved activity level 100% by d/c/Active    Problem: Impaired Cognition  Cognition: Primary Team Goal: Pt will utilize internal/external compensatory  memory  strategies to support new learning of precautions, medical management, as  well as brain injury education w/ only min-mod verbal cues from  staff/caregiver./Active    Problem: Impaired Mobility  Mobility: Primary Team Goal: Pt will be modI for bed mobility, and ambulation  with LRAD and SPV for stairs to return home safely./Active    Problem: Impaired Pain Management  Pain Mgmt: Primary Team Goal: Patient will  be able to participate in therapy  with pain level 2/10 prior to medicated for pain 100% by d/c/Active    Problem: Impaired Psychosocial Skills/Behavior  PsychoSocial: Primary Team Goal: Patient will regularly employ adaptive coping  strategies to assist with mood regulation. [name]    Problem: Impaired Self-care Mgmt/ADL/IADL  Self Care: Primary Team Goal: Pt's caregiver will provide recommended  environment, and supervision in order for pt to complete self are tasks at mod  I/supervision level and return home safely./Active    Problem: Safety Risk and Restraint  Safety: Primary Team Goal: Patient will recognize limitations and call for  assiaatance before attempting mobility 100% of the time in order to prevent  falls at home/Active    Please review Integrated Patient View Care Plan Flowsheet for Team identified  Problems, Interventions, and Goals.    Signed by: Odette Horns, RN 03/19/2019 1:32:00 AM
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
## 2019-03-19 NOTE — Progress Notes (Signed)
Writer discussed with patient the importance of wearing a telemetry monitor. Patient verbalized understanding but still refused to wear monitor. Patient states that they don't see any use for it since the pacemaker has never had any problems before.

## 2019-03-19 NOTE — Nursing Progress Note (Signed)
Patient refused telemetry monitor this shift. On call MD notified. MD stated patient HR is still brady so the nurse should educate patient on it and re-apply it. Patient refused the telemetry monitor entire shift.

## 2019-03-19 NOTE — Progress Notes (Signed)
[name]  MRN: [id number]  Account: [id number]  Session Start: 03/19/2019 11:00:00 AM  Session Stop: 03/19/2019 12:00:00 PM    Total Treatment Minutes:  Minutes    Inpatient Rehabilitation Group Note    Rehab Diagnosis: TBI  Demographics:            Age: 44Y            Gender: Male  Rehabilitation Precautions/Restrictions:   Bilateral lower extremities: WBAT, Risk for falls. 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  Group Participant(s): Patient    Treatment Provided: Participants selected gardening, arts and crafts or  activities in standing. Patient used bilateral upper extremities in gardening  and/or arts and crafts activity with supervision level of function from  wheelchair. Task targeted voicing preferences    ASSESSMENT  Participant(s) benefitted from support and encouragement of peers in a social  setting.  Participant(s) asked appropriate questions.  Participant(s) shared personal experiences.  Participant(s) had good participation.  Benefitted from psychosocial stimulation in an outdoor setting.  Socialized with peers and therapists present.    Signed by: Idolina Primer, CTRS 03/19/2019 12:00:00 PM
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
## 2019-03-19 NOTE — Rehab Progress Note ([facility]) (Signed)
Jonathan [name]  MRN: [id number]  Account: [id number]  Session Start: 03/19/2019 9:00:00 AM  Session Stop: 03/19/2019 10:00:00 AM    Total Treatment Minutes: 60.00 Minutes    Physical Therapy  Inpatient Rehabilitation Treatment Note    Rehab Diagnosis: TBI  Demographics:            Age: 9Y            Gender: Male  Rehabilitation Precautions/Restrictions:   Bilateral lower extremities: WBAT, Risk for falls. Seizure precaution, skin  integrity    OBJECTIVE  Vital Signs:                       Before Activity              After Activity  Vitals  Time                 0905                         -  Position/Activity    Seated in WC                 -  BP Systolic          130                          -  BP Diastolic         81                           -  Pulse                60                           -    Interventions:       Therapeutic Activities:  Pt hx recieved from RN in room admistering  medications. Pt agreeable to therapy stating that he can walk without walker.  GAIT: Ambulation assesed without use of walker due to patient being adamant he  won't use it, increased weaving and unsteadiness. Ambulation with use of SPC in  L UE from RN main station to blue lobby and outside to seated rest during break.   Constant VC needed for advancement of the cane in L UE with advancment of the R  LE. VC for attention to cane advancement during uneven surface training.  Ambulation x 200 ft x 3 on brick outside with navigating turns and  uphill/downhilll. Rest break between each time.  Curb negotiation: Pt required constant verbal cues for sequencing during curb  negotitation, needing to slow down and think about steps needed.    Patient was left seated in chair at nurse's station. Handoff to nurse completed.  Chair alarm in place and activated.  Personal items within reach.  Assistive devices positioned out of reach.    ASSESSMENT  Pt insistent he will not be using cane after discharge to home environment, lack  of insight  into deficits. Throughout session constantly refusing to use cane  appropriately without reminders and avoidance of outside distractions. Pt  continued to be educated on safety awareness and reduction in risk of fall with  use of cane. Pt will continue to benefit from family training with wife and  continued re-inforcement of importance of SPC cane use to  reduce risk of  falling.    3 Hour Rule Minutes: 60 minutes of PT treatment this session count towards  intensity and duration of therapy requirement. Patient was seen for the full  scheduled time of PT treatment this session.  Therapy Mode Minutes: Individual: 60 minutes.    Signed by: Dorothea Ogle, PT 03/19/2019 10:00:00 AM
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
## 2019-03-19 NOTE — Rehab Progress Note ([facility]) (Signed)
NAMEBEVERLY SURIANO  MRN: [id number]  Account: [id number]  Session Start: 03/14/2019 12:00:00 AM  Session Stop: 03/14/2019 12:00:00 AM    Total Treatment Minutes:  Minutes    Case Management  Inpatient Rehabilitation Team Conference    Conference Date/Time: 03/13/2019 2:10:00 PM    Demographics            Age: 89Y            Gender: Male    Admission Date: 03/06/2019 12:57:06 PM  Diagnosis: TBI  Comorbidities:    VITAL SIGNS  Blood Pressure: 137/76 mmHg  Temperature:  degrees  Pulse:  beats per minute  Respirations:  breaths per minute  Pain: 6/10    WEIGHT and NUTRITION  Admission Weight: 206.1 pounds; Current Weight: 206.1pounds  Weight Change since Admit: Patient has had no weight change since admission.  Food Consistency: Dysphagia Mechanically Altered  Liquid Consistency:Nectar    Plan Of Care  Anticipated Discharge Date/Estimated Length of Stay: 03/20/2019  Anticipated Discharge Destination: Community discharge with assistance  Discharge Plan : Home with [facility]  Fall Risk Level: Red (High)  Medical Necessity Expected Level Rationale: Monitor pain, cognitive dysfunction,  nutrition and hydration will be optimized  Intensity and Duration: an average of 3 hours/5 days per week  Medical Supervision and 24 Hour Rehab Nursing: x  Physical Therapy: x  PT Intensity/Duration: 60-120 min, 5-6 days per week  Occupational Therapy: x  OT Intensity/Duration: 60-120 min daily, 5-6 days per week  Speech and Language Therapy: x  SLP Intensity/Duration: 60-176min daily, 5-6 days per week  Therapeutic Recreation: x  Psychology: x  Case Management: Shirlean Mylar, CM  Nurse: Haze Boyden, RN  Occupational Therapy: Elsie Amis, OT  Physical Therapy: Lorin Glass, PT  Physician: Verdis Frederickson, MD  Psychology: Lamar Blinks, NP  Speech Language Pathology: Trenda Moots, SLP  Speech Language Pathology Other: Orvan Seen, SLP reporting for Trenda Moots,  SLP    The following is a list of patient problems that have been identified by  the  interdisciplinary team:    Problem: Impaired Cardiac Function  Cardiac Status Update: HTN  Interventions:  Administer cardiac medications as prescribed monitoring for side effects: Active  Monitor vital signs/hemodynamics and report changes: Active  Provide structured rest periods: Active  Monitor labs: Active  Monitor and assess intake, output and weights: Active  Assess extremities for color, temperature and edema: Active  Patient/family/caregiver education: Active  Cardiac: Primary Team Goal/Status: Systolic blood pressure will be at or below  150 via medications, diet,and improved activity level 100% by d/c / Active    Problem: Impaired Cognition  Cognition Status Update: Still needing significant reinforcement of memory  deficit management w/ seeking out premorbid strategies including note-taking,  smartphone, and to-do lists.  Team Identified Barrier to Discharge: Yes  Interventions:  Decrease environmental stimuli: Active  Safety awareness training: Active  Compensatory strategies: Active  Cognition: Primary Team Goal/Status: Pt will utilize internal/external  compensatory memory strategies to support new learning of precautions, medical  management, as well as brain injury education w/ only min-mod verbal cues from  staff/caregiver. / Active    Problem: Impaired Mobility  Mobility Status Update: Pt ambulates with RW for 150' and CGA  Team Identified Barrier to Discharge: Yes  Interventions:  Transfer training: Active  Gait training: Active  Wheelchair propulsion and management: Active  Mobility: Primary Team Goal/Status: Pt will be modI for bed mobility, and  ambulation with LRAD and SPV for stairs to return home  safely. / Active    Problem: Impaired Pain Management  Pain Management Status Update: intermittent c/o pain. Current med regimen  effective.  Interventions:  Medications as ordered: Active  Distraction and/or relaxation: Active  Positioning: Active  Pain Mgmt: Primary Team Goal/Status: Patient  will  be able to participate in  therapy with pain level 2/10 prior to medicated for pain 100% by d/c / Active    Problem: Impaired Psychosocial Skills/Behavior  PsychoSocial/Behavior Status Update: Patient is working on adaptively coping  with current medical status.  Team Identified Barrier to Discharge: No  Interventions:  Supportive counseling: Active  Coping skills training: Active  PsychoSocial: Primary Team Goal/Status: Patient will regularly employ adaptive  coping strategies to assist with mood regulation.  / Active    Problem: Impaired Self-care Mgmt/ADL/IADL  Self Care/ADL/IADL Status Update: Patient currently requires CGA to min A for  self care primarily secondary to insight and safety awareness deficits and will  require family training prior to d/c home.  Team Identified Barrier to Discharge: Yes  Interventions:  Adaptive equipment training: Active  Compensatory strategies: Active  Encourage patient to participate in activities of daily living: Active  Self Care: Primary Team Goal/Status: Pt's caregiver will provide recommended  environment, and supervision in order for pt to complete self are tasks at mod  I/supervision level and return home safely. / Active    Problem: Safety Risk and Restraint  Safety Risk Status Update: RED Fall Risk Level. AVS monitoring for safety  Interventions:  Identify patient at risk for falls: Active  Call light within reach: Active  Orient patient/family/caregiver to room and equipment: Active  Stay with patient in bathroom: Active  Bed alarm/wheelchair alarm: Active  Anticipate needs, including personal items within reach: Active  Safety: Primary Team Goal/Status: Patient will recognize limitations and call  for assiaatance before attempting mobility 100% of the time in order to prevent  falls at home / Active    Self Care Functional Status  Eating:  6 - Independent without devices  Oral Hygiene:  4 - Supervision or touching assistance  Toileting:  3 - Partial/moderate  assistance  Shower Bathe:  4 - Supervision or touching assistance  Upper Body Dressing:  4 - Supervision or touching assistance  Lower Body Dressing:  4 - Supervision or touching assistance  Putting On/Taking Off Footwear:  4 - Supervision or touching assistance    Mobility Functional Status  Roll Left and Right:  60 - Not attempted due to medical condition or safety  concern  Sit to Lying:  4 - Supervision or touching assistance  Lying to Sitting on Side of Bed:  4 - Supervision or touching assistance  Sit to Stand:  4 - Supervision or touching assistance  Chair/Bed-to-Chair Transfer:  4 - Supervision or touching assistance  Toilet Transfer:  4 - Supervision or touching assistance  Car Transfer:  88 - Not attempted due to medical condition or safety concern  Walk 10 Feet:  4 - Supervision or touching assistance  Walk 50 Feet with Two Turns:  4 - Supervision or touching assistance  Walking 150 Feet:  4 - Supervision or touching assistance  Walking 10 Feet on Uneven Surface:  88 - Not attempted due to medical condition  or safety concern  1 Step - Curb:  4 - Supervision or touching assistance  4 Steps:  4 - Supervision or touching assistance  12 Steps:  16 - Not attempted due to medical condition or safety concern  Picking  Up an Object:  3 - Partial/moderate assistance  Wheel 50 Feet with Two Turns:  3 - Partial/moderate assistance  Wheel 150 Feet:  3 - Partial/moderate assistance      Comments:    Signed by: Kelli Churn, RN 03/14/2019 9:12:00 AM    Physician CoSigned By: Antony Contras 03/19/2019 11:22:10

## 2019-03-20 LAB — CBC AND DIFFERENTIAL
Absolute NRBC: 0 10*3/uL (ref 0.00–0.00)
Basophils Absolute Automated: 0.09 10*3/uL — ABNORMAL HIGH (ref 0.00–0.08)
Basophils Automated: 1.3 %
Eosinophils Absolute Automated: 0.2 10*3/uL (ref 0.00–0.44)
Eosinophils Automated: 2.8 %
Hematocrit: 35 % — ABNORMAL LOW (ref 37.6–49.6)
Hgb: 11.7 g/dL — ABNORMAL LOW (ref 12.5–17.1)
Immature Granulocytes Absolute: 0.03 10*3/uL (ref 0.00–0.07)
Immature Granulocytes: 0.4 %
Lymphocytes Absolute Automated: 1.65 10*3/uL (ref 0.42–3.22)
Lymphocytes Automated: 23 %
MCH: 29.3 pg (ref 25.1–33.5)
MCHC: 33.4 g/dL (ref 31.5–35.8)
MCV: 87.5 fL (ref 78.0–96.0)
MPV: 9.5 fL (ref 8.9–12.5)
Monocytes Absolute Automated: 1.01 10*3/uL — ABNORMAL HIGH (ref 0.21–0.85)
Monocytes: 14.1 %
Neutrophils Absolute: 4.2 10*3/uL (ref 1.10–6.33)
Neutrophils: 58.4 %
Nucleated RBC: 0 /100 WBC (ref 0.0–0.0)
Platelets: 355 10*3/uL — ABNORMAL HIGH (ref 142–346)
RBC: 4 10*6/uL — ABNORMAL LOW (ref 4.20–5.90)
RDW: 14 % (ref 11–15)
WBC: 7.18 10*3/uL (ref 3.10–9.50)

## 2019-03-20 LAB — PT/INR
PT INR: 1.2 — ABNORMAL HIGH (ref 0.9–1.1)
PT: 15.3 s — ABNORMAL HIGH (ref 12.6–15.0)

## 2019-03-20 MED ORDER — CETIRIZINE HCL 10 MG PO TABS
10.0000 mg | ORAL_TABLET | Freq: Every day | ORAL | Status: DC
Start: 2019-03-21 — End: 2019-06-18

## 2019-03-20 MED ORDER — LACOSAMIDE 150 MG PO TABS
150.0000 mg | ORAL_TABLET | Freq: Two times a day (BID) | ORAL | 1 refills | Status: DC
Start: 2019-03-20 — End: 2019-03-20

## 2019-03-20 MED ORDER — WARFARIN SODIUM 10 MG PO TABS
10.00 mg | ORAL_TABLET | Freq: Every day | ORAL | 0 refills | Status: AC
Start: 2019-03-20 — End: 2019-04-19

## 2019-03-20 MED ORDER — TRAZODONE HCL 50 MG PO TABS
50.00 mg | ORAL_TABLET | Freq: Every evening | ORAL | 1 refills | Status: AC
Start: 2019-03-20 — End: 2019-05-19

## 2019-03-20 MED ORDER — LOSARTAN POTASSIUM 50 MG PO TABS
50.00 mg | ORAL_TABLET | Freq: Every day | ORAL | 1 refills | Status: AC
Start: 2019-03-21 — End: 2019-05-20

## 2019-03-20 MED ORDER — MELATONIN 3 MG PO TABS
3.00 mg | ORAL_TABLET | Freq: Every evening | ORAL | 0 refills | Status: AC | PRN
Start: 2019-03-20 — End: 2019-04-19

## 2019-03-20 MED ORDER — ENOXAPARIN SODIUM 100 MG/ML SC SOLN
90.00 mg | Freq: Two times a day (BID) | SUBCUTANEOUS | 0 refills | Status: AC
Start: 2019-03-20 — End: 2019-03-25

## 2019-03-20 MED ORDER — POLYETHYLENE GLYCOL 3350 17 G PO PACK
17.00 g | PACK | Freq: Every day | ORAL | 0 refills | Status: AC
Start: 2019-03-21 — End: 2019-04-20

## 2019-03-20 MED ORDER — GUAIFENESIN ER 600 MG PO TB12
600.00 mg | ORAL_TABLET | Freq: Two times a day (BID) | ORAL | Status: DC
Start: 2019-03-20 — End: 2019-06-18

## 2019-03-20 MED ORDER — GABAPENTIN 300 MG PO CAPS
300.00 mg | ORAL_CAPSULE | Freq: Three times a day (TID) | ORAL | 1 refills | Status: AC
Start: 2019-03-20 — End: 2019-05-19

## 2019-03-20 MED ORDER — LACOSAMIDE 150 MG PO TABS
150.00 mg | ORAL_TABLET | Freq: Two times a day (BID) | ORAL | 1 refills | Status: AC
Start: 2019-03-20 — End: 2019-05-19
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
## 2019-03-20 NOTE — Rehab Discharge Summary ([facility]) (Signed)
Jonathan [name]  MRN: [id number]  Account: [id number]  Session Start: 03/20/2019 12:00:00 AM  Session Stop: 03/20/2019 12:00:00 AM    Total Treatment Minutes:  Minutes    Inpatient Rehabilitation  Rehabilitation Nursing Discharge Summary    Rehab Diagnosis: TBI  Demographics:            Age: 45Y            Gender: Male  Primary Language: English    Date of Onset:  02/22/19  Date of Admission: 03/06/2019 12:57:06 PM    Rehabilitation Precautions Restrictions:   Bilateral lower extremities: WBAT, Risk for falls. Seizure precaution, skin  integrity    Discharge:  Patient discharged to:   Home  At discharge, the patient was discharged to live (with):  Family / Relatives.  Follow up providers include: Spouse.    WEIGHT  Current Weight: 93.68 kilograms. Patient weighed using bed scale.    Patient Report: " I am doing okay, I am going home tomorrow and I know my wife  will be a great help"  Patient/Caregiver Goals:  To go back to my normal state, doing my own things    Wounds/Incisions: No wounds or incisions.    Medication Review: No clinically significant medication issues identified this  shift.    Education Provided:    Education Provided: Safety issues and interventions. Supervision requirements.  Impulsivity. Medication. Name and dosage. Administration. Purpose. Side Effects.  Anticoagulants. Pain management. Pain scale. Medication options. Side effects.  Clinical indicators of pain. Safety.       Audience: Patient.       Mode: Explanation.  Demonstration.       Response: Applied knowledge.  Verbalized understanding.    Nursing Interventions This Shift: Patient was assessed from head to toe . Pain  assessement was done and prn pain medication administered for patient c/o of  pain to the right rib cage. Patient is supervision for his ADLs and was  supervised to prevent fall. Education on medication was given and all due  medication administered. Bed to the lowest level and all safety precations  in  place.    ASSESSMENT  Long Term Goals (status prior to discharge): 1. Patient will  be able to  participate in therapy with pain level 2/10 prior to medicated for pain 100% by  d/c - Goal Met   2. Patient will recognize limitations and call for assiaatance before  attempting mobility 100% of the time in order to prevent falls at home - Goal  Not Met   3. Systolic blood pressure will be at or below 150 via medications, diet,and  improved activity level 100% by d/c  Two weeks  Short Term Goals (status prior to discharge): 1. Patient will  be able to  participate in therapy with pain level 2/10 prior to medicated for pain 50% by  d/c - Goal Met   2. Patient will recognize limitations and call for assiaatance before  attempting mobility 50% of the time in order to prevent falls at home - Goal Not  Met   3. Systolic blood pressure will be at or below 150 via medications, diet,and  improved activity level 50% by d/c - Goal Not Met   4. Patient's bradychardia will improve to normal level in order to discontinue  telemtry.  One week    Progress Towards Goals (final status):   LONG TERM GOAL REVIEW:       1. Patient will  be able to participate in  therapy with pain level 2/10  prior to medicated for pain 100% by d/c  - Met       2. Patient will recognize limitations and call for assiaatance before  attempting mobility 100% of the time in order to prevent falls at home - Not Met  Patient continues to be impulsive and does not call at all times prior to OOB  transfers       3. Systolic blood pressure will be at or below 150 via medications,  diet,and improved activity level 100% by d/c - Met    PLAN  Recommendations for Follow-Up Care:   Patient did not receive valuables because Patient will receive all his  valuables at dischange at the time of leaving the unit.  Bladder Program: continent  Bowel Program:  Last Bowel Movement- 03/19/19   continent  Skin: Patient has no wounds or incisions on the body.  Current Diet:  Regular diet with thin liquid.  Pain Management: Medication. Patient has tylenol 650mg  every 6 hours and NORCO  5- 325 mg every 4 hours as needed for pain control Position changes. Relaxation.  Ice. Patient educated to follow his case manager's discharge instructions for  all his follow up appointment.    Care Plan  Identified problems from team documentation:  Problem: Impaired Bladder Management    Problem: Impaired Bowel Management    Problem: Impaired Cardiac Function  Cardiac: Primary Team Goal: Systolic blood pressure will be at or below 150 via  medications, diet,and improved activity level 100% by d/c/Active    Problem: Impaired Cognition  Cognition: Primary Team Goal: Pt will utilize internal/external compensatory  memory strategies to support new learning of precautions, medical management, as  well as brain injury education w/ only min-mod verbal cues from  staff/caregiver./Met    Problem: Impaired Mobility  Mobility: Primary Team Goal: Pt will be modI for bed mobility, and ambulation  with LRAD and SPV for stairs to return home safely./Not Met    Problem: Impaired Pain Management  Pain Mgmt: Primary Team Goal: Patient will  be able to participate in therapy  with pain level 2/10 prior to medicated for pain 100% by d/c/Active    Problem: Impaired Psychosocial Skills/Behavior  PsychoSocial: Primary Team Goal: Patient will regularly employ adaptive coping  strategies to assist with mood regulation. [name]    Problem: Impaired Self-care Mgmt/ADL/IADL  Self Care: Primary Team Goal: Pt's caregiver will provide recommended  environment, and supervision in order for pt to complete self are tasks at mod  I/supervision level and return home safely./Active    Problem: Safety Risk and Restraint  Safety: Primary Team Goal: Patient will recognize limitations and call for  assiaatance before attempting mobility 100% of the time in order to prevent  falls at home/Active    Status update for discharge:      Please  review Integrated Patient View Care Plan Flowsheet for Team identified  Problems, Interventions, and Goals.    Signed by: Odette Horns, RN 03/20/2019 1:20:00 AM

## 2019-03-20 NOTE — Rehab Progress Note ([facility]) (Signed)
NAMESARGENT [name]  MRN: [id number]  Account: [id number]  Session Start: 03/20/2019 10:30:00 AM  Session Stop: 03/20/2019 11:00:00 AM    Total Treatment Minutes: 30.00 Minutes    Psychology Services  Inpatient Rehabilitation Family Progress Note    Rehab Diagnosis: TBI  Demographics:            Age: 69Y            Gender: Male          Education Provided:    Education Provided: Adaptive coping skills and TBI psychoeducation. .       Audience: Patient and Caregiver       Mode: Explanation.       Response: Verbalized understanding.    Interventions:   NPSY FAMILY HLTH INTERV INT    ASSESSMENT  Impressions: Met briefly with patient and family to provide support and  education to assist with the adjustment/coping process. Discussed patient's  medical/cognitive status, successes and functional gains during the course of  hiss rehab stay. Offered support, reassurance, and assisted with problem-solving  around methods which could be employed for the continual emotional support of  the patient during the remainder of his recovery upon discharge. Discussed  methods for addressing role changes and maintaining momentum in the home  environment. Overall patient and family were open and receptive to supportive  counsel and education.    PLAN  Continued Psychology services are not recommended at this time due to:  Discharged from hospital.    Signed by: Lamar Blinks, PhD 03/20/2019 10:30:00 AM
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
## 2019-03-20 NOTE — Rehab Discharge Instruction ([facility]) (Signed)
Jonathan [name]  MRN: [id number]  Account: [id number]  Session Start: 03/20/2019 12:00:00 AM  Session Stop: 03/20/2019 12:00:00 AM    Total Treatment Minutes:  Minutes    Case Management  Inpatient Rehabilitation Discharge Instructions    Discharge Date: Wednesday, March 20, 2019  Transportation: Set designer; Family to transport  Discharge Plan: Home Health Referral    Referral from Shirlean Mylar (Case Manager) for home health care upon discharge.    By [employer], the patient has the right to freely choose a home care provider.  Arrangements have been made with:    ? A company of the patients choosing. We have supplied the patient with a  listing of providers in your area who asked to be included and participate in  Medicare.  ? [facility], formerly [facility], a home care agency that  provides adult home care services and participates in Medicare  ? The preferred provider of your insurance company. Choosing a home care  provider other than your insurance company's preferred provider may affect your  insurance coverage.    Home Health Discharge Information     Your doctor has ordered Skilled Nursing, Physical Therapy, Occupational Therapy  and Speech and Language Therapy in-home service(s) for you while you recuperate  at home, to assist you in the transition from hospital to home.    The agency that you or your representative chose to provide the service:  Name of Home Health Agency Placement: [facility]] [phone]  Start of Care: 8/20    The above services were set up by:  Ashok Cordia  [facility] Liaison)   Phone  [phone]    IF YOU HAVE NOT HEARD FROM YOUR HOME YOUR HOME HEALTH AGENCY WITHIN 24-48 HOURS  AFTER DISCHARGE PLEASE CALL YOUR AGENCY TO ARRANGE A TIME FOR YOUR FIRST VISIT.  FOR ANY SCHEDULING CONCERNS OR QUESTIONS RELATED TO HOME HEALTH, SUCH AS TIME OR  DATE PLEASE CONTACT YOUR HOME HEALTH AGENCY AT THE NUMBER LISTED ABOVE.    **PT/INR labs will be drawn and sent to Dr.  Geradine Girt on 03/22/2019**      Follow-up Appointment(s):    Geradine Girt, MD   [facility]  Contact Information:  [address]  [address]  [place], [place] [address]  T: [phone]  When: Follow up in 1-2 weeks      Christean Leaf, MD  Cardiology  Contact Information:  [address]  [place], [place] [address]  T: [phone]  When: Follow up in 1-2 weeks       [facility] Group-Neuro[facility]  Contact Information:  T: [phone]--call for all locations  When: Follow up in 1-2 weeks and will require repeat imaging      [facility]  Contact Information:  [name]  [address]  [place], [place] [address]  T: [phone]. The office may be able to provide additional locations  When: Follow up in 2-4 weeks      Verdis Frederickson, MD  Contact Information:  [address]  [place], [place] [address]  T: [phone]  When: Follow up in 4-6 weeks      The Treatment Team also recommends follow up with Pulmonology. Refer to your  Primary Care for recommendations for Pulmonologist.      [employer]ification of Medicare Important Message: The [employer] of Medicare  Important Message letter was issued.    Community Resources: None provided  Additional Information: CM reviewed discharge instructions with patient at  bedside.  Patient verbalized understanding.    Signed by: Shirlean Mylar, MSW 03/19/2019 11:27:00 AM

## 2019-03-21 ENCOUNTER — Telehealth (INDEPENDENT_AMBULATORY_CARE_PROVIDER_SITE_OTHER): Payer: Self-pay | Admitting: Family Medicine

## 2019-03-21 NOTE — Telephone Encounter (Signed)
Jonathan [name] home health [phone]] states INR = 1.6    1. When should she check next?  2.What dosage of coumadin should he be on?  3.Should he still take lovenox?
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
## 2019-03-22 NOTE — Rehab Progress Note ([facility]) (Signed)
Jonathan [name]  MRN: [id number]  Account: [id number]  Session Start: 03/20/2019 12:00:00 AM  Session Stop: 03/20/2019 12:00:00 AM    Total Treatment Minutes:  Minutes    Case Management  Inpatient Rehabilitation Team Conference    Conference Date/Time: 03/20/2019  2:00 pm    Demographics            Age: 38Y            Gender: Male    Admission Date: 03/06/2019 12:57:06 PM  Diagnosis: TBI  Comorbidities:    VITAL SIGNS  Blood Pressure: 144/77 mmHg  Temperature:  degrees  Pulse:  beats per minute  Respirations:  breaths per minute  Pain: 6/10    WEIGHT and NUTRITION  Admission Weight: 206.1 pounds; Current Weight: 206.1pounds  Weight Change since Admit: Patient has had no weight change since admission.  Food Consistency: Dysphagia Mechanically Altered  Liquid Consistency:Nectar    Plan Of Care  Anticipated Discharge Date/Estimated Length of Stay: 03/20/2019  Anticipated Discharge Destination: Community discharge with assistance  Discharge Plan : Home with [facility]  Fall Risk Level: Red (High)  Medical Necessity Expected Level Rationale: Monitor pain, cognitive dysfunction,  nutrition and hydration will be optimized  Intensity and Duration: an average of 3 hours/5 days per week  Medical Supervision and 24 Hour Rehab Nursing: x  Physical Therapy: x  PT Intensity/Duration: 60-120 min, 5-6 days per week  Occupational Therapy: x  OT Intensity/Duration: 60-120 min daily, 5-6 days per week  Speech and Language Therapy: x  SLP Intensity/Duration: 60-178min daily, 5-6 days per week  Therapeutic Recreation: x  Psychology: x  Case Management: Shirlean Mylar, CM  Nurse: Haze Boyden, RN  Occupational Therapy: Elsie Amis, OT  Physical Therapy: Lorin Glass, PT  Physician: Verdis Frederickson, MD  Psychology: Lamar Blinks, NP  Speech Language Pathology: Trenda Moots, SLP  Speech Language Pathology Other: Orvan Seen, SLP reporting for Trenda Moots,  SLP    The following is a list of patient problems that have been identified by  the  interdisciplinary team:    Problem: Impaired Bladder Management  Bladder Management Status Update: Continent    Problem: Impaired Bowel Management  Bowel Management Status Update: Continent    Problem: Impaired Cardiac Function  Cardiac Status Update: HTN. New onset bradycardia with remote telemetry  monitoring which patient often self removes.  Team Identified Barrier to Discharge: Yes  Interventions:  Administer cardiac medications as prescribed monitoring for side effects: Active  Monitor vital signs/hemodynamics and report changes: Active  Provide structured rest periods: Active  Monitor labs: Active  Monitor and assess intake, output and weights: Active  Assess extremities for color, temperature and edema: Active  Patient/family/caregiver education: Active  Cardiac: Primary Team Goal/Status: Systolic blood pressure will be at or below  150 via medications, diet,and improved activity level 100% by d/c / Active    Problem: Impaired Cognition  Cognition Status Update: Requires supportive environment for attending to aides  in order to utilize correctly and more independently.  Team Identified Barrier to Discharge: No  Interventions:  Decrease environmental stimuli: Discontinue  Safety awareness training: Discontinue  Compensatory strategies: Discontinue  Cognition: Primary Team Goal/Status: Pt will utilize internal/external  compensatory memory strategies to support new learning of precautions, medical  management, as well as brain injury education w/ only min-mod verbal cues from  staff/caregiver. / Met    Problem: Impaired Mobility  Mobility Status Update: Partially met - modI bed mobility, and SPV for  ambulation with SPC  Team Identified Barrier to Discharge: Yes  Interventions:  Transfer training: Active  Gait training: Active  Wheelchair propulsion and management: Active  Mobility: Primary Team Goal/Status: Pt will be modI for bed mobility, and  ambulation with LRAD and SPV for stairs to return home  safely. / Not Met    Problem: Impaired Pain Management  Pain Management Status Update: Rib and generalized pain persist and impact  patients performance and compliance with safety plan.  Team Identified Barrier to Discharge: No  Interventions:  Medications as ordered: Active  Distraction and/or relaxation: Active  Positioning: Active  Pain Mgmt: Primary Team Goal/Status: Patient will  be able to participate in  therapy with pain level 2/10 prior to medicated for pain 100% by d/c / Active    Problem: Impaired Psychosocial Skills/Behavior  PsychoSocial/Behavior Status Update: Patient is working on adaptively coping  with current medical status.  Team Identified Barrier to Discharge: No  Interventions:  Supportive counseling: Discontinue  Coping skills training: Discontinue  PsychoSocial: Primary Team Goal/Status: Patient will regularly employ adaptive  coping strategies to assist with mood regulation.  / Met    Problem: Impaired Self-care Mgmt/ADL/IADL  Self Care/ADL/IADL Status Update: Patient requires consistent supervision for  all self care due to attention, safety awareness, and insight to deficits.  Team Identified Barrier to Discharge: Yes  Interventions:  Adaptive equipment training: Active  Compensatory strategies: Active  Encourage patient to participate in activities of daily living: Active  Self Care: Primary Team Goal/Status: Pt's caregiver will provide recommended  environment, and supervision in order for pt to complete self are tasks at mod  I/supervision level and return home safely. / Not Met    Problem: Safety Risk and Restraint  Safety Risk Status Update: Red Fall RIsk. Does not use call bell to seek help  with mobility. Decreased safety awareness. AVS monitoring in use.  Team Identified Barrier to Discharge: Yes  Interventions:  Identify patient at risk for falls: Active  Call light within reach: Active  Orient patient/family/caregiver to room and equipment: Active  Stay with patient in bathroom:  Active  Bed alarm/wheelchair alarm: Active  Anticipate needs, including personal items within reach: Active  Safety: Primary Team Goal/Status: Patient will recognize limitations and call  for assiaatance before attempting mobility 100% of the time in order to prevent  falls at home / Active    Self Care Functional Status  Eating:  6 - Independent without devices  Oral Hygiene:  4 - Supervision or touching assistance  Toileting:  4 - Supervision or touching assistance  Shower Bathe:  4 - Supervision or touching assistance  Upper Body Dressing:  5 - Setup or clean-up assistance  Lower Body Dressing:  4 - Supervision or touching assistance  Putting On/Taking Off Footwear:  4 - Supervision or touching assistance    Mobility Functional Status  Roll Left and Right:  6 - Independent without devices  Sit to Lying:  6 - Independent without devices  Lying to Sitting on Side of Bed:  6 - Independent without devices  Sit to Stand:  4 - Supervision or touching assistance  Chair/Bed-to-Chair Transfer:  4 - Supervision or touching assistance  Toilet Transfer:  4 - Supervision or touching assistance  Car Transfer:  4 - Supervision or touching assistance  Walk 10 Feet:  4 - Supervision or touching assistance  Walk 50 Feet with Two Turns:  4 - Supervision or touching assistance  Walking 150 Feet:  4 - Supervision or touching assistance  Walking 10 Feet on Uneven Surface:  4 - Supervision or touching assistance  1 Step - Curb:  4 - Supervision or touching assistance  4 Steps:  4 - Supervision or touching assistance  12 Steps:  4 - Supervision or touching assistance  Picking Up an Object:  4 - Supervision or touching assistance  Wheel 50 Feet with Two Turns:  3 - Partial/moderate assistance  Wheel 150 Feet:  3 - Partial/moderate assistance      Comments:    Signed by: Jari Favre, BSN, RN, ACM-RN 03/20/2019 3:33:00 PM    Physician CoSigned By: Antony Contras 03/22/2019 14:49:39

## 2019-03-22 NOTE — Telephone Encounter (Signed)
Relayed the following information to pt's wife per Dr. Brooke Dare:    "1g of Tylenol, three times a day as needed (two 500mg  Extra Strength)

## 2019-03-23 ENCOUNTER — Other Ambulatory Visit (INDEPENDENT_AMBULATORY_CARE_PROVIDER_SITE_OTHER): Payer: Self-pay | Admitting: Family Medicine

## 2019-03-23 MED ORDER — WARFARIN SODIUM 1 MG PO TABS
ORAL_TABLET | ORAL | 0 refills | Status: DC
Start: 2019-03-23 — End: 2019-05-20

## 2019-03-23 NOTE — Telephone Encounter (Signed)
Pt/inr  1.6 coumadin increased to 10mg  daily plus 1mg  twice a week .

## 2019-03-27 ENCOUNTER — Telehealth: Payer: Self-pay | Admitting: Family Medicine

## 2019-03-27 NOTE — Telephone Encounter (Signed)
Patient's wife called to inform Dr. Brooke Dare that patient had another fall this morning and hit his head. Wife contacted cardiologist and was advised to take patient to ER and to inform Dr. Brooke Dare to review ct scan.      Patient can be reached at    [phone]

## 2019-03-27 NOTE — Rehab PPS CMG ([facility]) (Signed)
NAMEAQUARIUS LATOUCHE  MRN: [id number]  Account: [id number]  Session Start: 03/20/2019 12:00:00 AM  Session Stop: 03/20/2019 12:00:00 AM    Total Treatment Minutes:  Minutes    PPS CMG Coordinator  Inpatient Rehabilitation Discharge      Discharge Against Medical Advice:  No.  Discharge Information: Patient Discharged Alive:  Yes  Discharge Destination/Living Setting: Home with Home Health Services  Diagnosis for Interruption/Death:  ICD    Impairment Group: Brain Dysfunction: 02.22 Traumatic, closed injury    Comorbidities:  Rank Code      Description    1    I48.20    Chronic atrial fibrillation, unspecified  2    J90.      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 of unspecified duration,                 subsequent encounter  5    S27.322D  Contusion of lung, bilateral, subsequent                 encounter  6    S27.0XXD  Traumatic pneumothorax, subsequent encounter  7    S22.41XD  Multiple fractures of ribs, right side,                 subsequent encounter for fracture with routine                 healing  8    S22.038D  Other fracture of third thoracic vertebra,                 subsequent encounter for fracture with routine                 healing  9    S36.112D  Contusion of liver, subsequent encounter  10   Z95.2     Presence of prosthetic heart valve  11   H53.2     Diplopia  12   H91.90    Unspecified hearing loss, unspecified ear  13   Z95.0     Presence of cardiac pacemaker  14   R20.0     Anesthesia of skin  15   G47.00    Insomnia, unspecified  16   I10.      Essential (primary) hypertension  17   I49.5     Sick sinus syndrome  18   M48.061   Spinal stenosis, lumbar region without                 neurogenic claudication  19   I25.10    Atherosclerotic heart disease of native                 coronary artery without angina pectoris  20   Z86.73    Personal history of transient ischemic attack                  (TIA), and cerebral infarction without residual                 deficits  21   R56.9     Unspecified convulsions  22   R59.0     Localized enlarged lymph nodes  23   E87.5     Hyperkalemia  24  J47.9     Bronchiectasis, uncomplicated  25   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    QUALITY INDICATORS  Section GG: Functional Abilities  Self Care Discharge Performance                       Performance  GG0130. Self Care  Eating               6 - Independent  Oral hygiene         4 - Supervision or touching assistance  Toileting hygiene    4 - Supervision or touching assistance  Shower/bathe self    4 - Supervision or touching assistance  Upper body dressing  5 - Setup or clean-up assistance  Lower body dressing  4 - Supervision or touching assistance  Footwear on/off      4 - Supervision or touching assistance    Mobility Discharge Performance                       Performance  GG0170. Mobility  Roll left/right      6 - Independent  Sit to lying         6 - Independent  Lying to sitting bed 6 - Independent  Sit to stand         4 - Supervision or touching assistance  Chair/bed transfer   4 - Supervision or touching assistance  Toilet transfer      4 - Supervision or touching assistance  Car transfer         4 - Supervision or touching assistance  Walk 10 feet         4 - Supervision or touching assistance  Walk 50 ft 2 turns   4 - Supervision or touching assistance  Walk 150  feet        4 - Supervision or touching assistance  10 ft uneven surface 4 - Supervision or touching assistance  1 step (curb)        4 - Supervision or touching assistance  4 steps              4 - Supervision or touching assistance  12 steps             4 - Supervision or touching assistance  Picking up object    4 - Supervision or touching assistance  Use wheelchair?      No    Section M. Skin Conditions Discharge  Unhealed Pressure Ulcer(s)/Injurie(s) at Stage 1 or Higher:  No  Current Number of Unhealed Pressure Ulcers  Branch  Worsening in Pressure Ulcer Status Since Admission  Branch  Healed Pressure Ulcer(s)  Branch    O0250.Influenza Vaccine - Discharge: Received in this facility for this year's  influenza vaccination season:  No.  Influenza Vaccine Not Received Due To: Patient not in this facility during this  year's influenza vaccination season.  Date Influenza Vaccine Received (if applicable)  Text Entry    Health Conditions: Fall(s) Since Admission:  No    Section N. Medication    Medication Intervention: N/A - There were no potential clinically significant  medication issues identified since admission or patient is not taking any  medications.    Catheter-associated Urinary Tract Infections (CAUTI): Branch    Signed by: Lane Hacker, OTR/L, PPS Coordinator 03/20/2019 4:30:00 PM

## 2019-03-28 ENCOUNTER — Ambulatory Visit (INDEPENDENT_AMBULATORY_CARE_PROVIDER_SITE_OTHER): Payer: Medicare Other

## 2019-03-28 ENCOUNTER — Encounter (INDEPENDENT_AMBULATORY_CARE_PROVIDER_SITE_OTHER): Payer: Self-pay | Admitting: Family Medicine

## 2019-03-28 ENCOUNTER — Ambulatory Visit (INDEPENDENT_AMBULATORY_CARE_PROVIDER_SITE_OTHER): Payer: Medicare Other | Admitting: Family Medicine

## 2019-03-28 VITALS — BP 132/67 | HR 66 | Temp 98.9°F | Resp 21 | Wt 206.0 lb

## 2019-03-28 DIAGNOSIS — W19XXXA Unspecified fall, initial encounter: Secondary | ICD-10-CM

## 2019-03-28 DIAGNOSIS — S20221A Contusion of right back wall of thorax, initial encounter: Secondary | ICD-10-CM

## 2019-03-28 MED ORDER — OXYCODONE-ACETAMINOPHEN 5-325 MG PO TABS
1.00 | ORAL_TABLET | ORAL | 0 refills | Status: AC | PRN
Start: 2019-03-28 — End: 2019-04-04
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
## 2019-03-28 NOTE — Patient Instructions (Signed)
Chest Bruise (Contusion)    The chest wall runs from the shoulders to the diaphragm or bottom of the ribs. It includes the front and back of the rib cage. It also includes the breastbone, shoulders, and collarbones. A blunt trauma such as during a car accident or fall can injure the chest wall. This injury is called a chest wall bruise (contusion).   Injury to the chest wall may result in pain, tenderness, bruising, and swelling. It may also result in broken ribs and injured muscles. These cause pain, often during breathing. If one or more ribs are broken in several areas, the chest wall may become unstable and painful. This may cause serious breathing trouble.   In the emergency room or urgent care center, any broken bones or other injuries will be assessed. You will likely be given medicine for pain. Broken ribs usually heal without further treatment. A broken shoulder or collarbone may be taped or supported with a sling.   Home care  Follow these guidelines when caring for yourself at home:  · Rest. Don’t do any heavy lifting or strenuous activity. Don’t do any activity that causes pain.  · Put an ice pack on the injured area. Do this for 20 minutes every 1 to 2 hours the first day. You can make an ice pack by wrapping a plastic bag of ice cubes in a thin towel. Continue to use the ice pack 3 to 4 times a day for the next 2 days. Then use the ice pack as needed to ease pain and swelling.  · After 1 to 2 days you may put a warm compress on the area. Do this for 10 minutes several times a day. A warm compress is a clean cloth that’s damp with warm water.  · Hold a pillow to the affected area when you cough. This will help ease pain.  · You may use over-the-counter pain medicine such as acetaminophen or ibuprofen to control pain, unless another pain medicine was prescribed. If you have chronic liver or kidney disease, talk with your healthcare provider before using these medicines. Also talk with your provider if  you’ve had a stomach ulcer or gastrointestinal bleeding.  Follow-up care  Follow up with your healthcare provider, or as advised.  When to seek medical advice  Call your healthcare provider right away if any of these occur:  · New abdominal pain or abdominal pain that gets worse  · Fever of 100.4ºF (38ºC) or higher, or as directed by your healthcare provider  Call 911  Call 911 if any of these occur:    · Dizziness, weakness, or fainting  · Shortness of breath, trouble breathing, or breathing fast  · Chest pain gets worse when you breathe  · Severe pain that comes on suddenly or lasts more than an hour  [employer] last reviewed this educational content on 06/01/2018  © 2000-2020 [employer] [address], [place] [address]. All rights reserved. This information is not intended as a substitute for professional medical care. Always follow your healthcare professional's instructions.

## 2019-03-29 ENCOUNTER — Encounter (INDEPENDENT_AMBULATORY_CARE_PROVIDER_SITE_OTHER): Payer: Self-pay | Admitting: Family Medicine

## 2019-04-01 ENCOUNTER — Inpatient Hospital Stay (INDEPENDENT_AMBULATORY_CARE_PROVIDER_SITE_OTHER): Payer: Medicare Other | Admitting: Family Medicine

## 2019-04-01 NOTE — Telephone Encounter (Signed)
Your patient 
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
## 2019-04-09 ENCOUNTER — Encounter (INDEPENDENT_AMBULATORY_CARE_PROVIDER_SITE_OTHER): Payer: Self-pay

## 2019-04-19 ENCOUNTER — Encounter (INDEPENDENT_AMBULATORY_CARE_PROVIDER_SITE_OTHER): Payer: Self-pay | Admitting: Clinical Cardiac Electrophysiology

## 2019-04-30 ENCOUNTER — Other Ambulatory Visit: Payer: Self-pay | Admitting: Neurology

## 2019-04-30 ENCOUNTER — Ambulatory Visit
Admission: RE | Admit: 2019-04-30 | Discharge: 2019-04-30 | Disposition: A | Discharge status: Home / Self Care | Payer: Medicare Other | Source: Ambulatory Visit | Attending: Neurology | Admitting: Neurology

## 2019-04-30 DIAGNOSIS — S062X9A Diffuse traumatic brain injury with loss of consciousness of unspecified duration, initial encounter: Secondary | ICD-10-CM | POA: Insufficient documentation

## 2019-04-30 DIAGNOSIS — S062X9S Diffuse traumatic brain injury with loss of consciousness of unspecified duration, sequela: Secondary | ICD-10-CM

## 2019-04-30 DIAGNOSIS — X58XXXA Exposure to other specified factors, initial encounter: Secondary | ICD-10-CM | POA: Insufficient documentation

## 2019-04-30 DIAGNOSIS — S069X0A Unspecified intracranial injury without loss of consciousness, initial encounter: Secondary | ICD-10-CM

## 2019-04-30 DIAGNOSIS — G9389 Other specified disorders of brain: Secondary | ICD-10-CM | POA: Insufficient documentation

## 2019-04-30 LAB — WHOLE BLOOD CREATININE WITH GFR POCT
GFR POCT: >60 mL/min/{1.73_m2} (ref >60)
Whole Blood Creatinine POCT: 0.4 mg/dL — ABNORMAL LOW (ref 0.5–1.1)

## 2019-04-30 MED ORDER — IOHEXOL 350 MG/ML IV SOLN
100.00 mL | Freq: Once | INTRAVENOUS | Status: AC | PRN
Start: 2019-04-30 — End: 2019-04-30
  Administered 2019-04-30: 16:00:00 100 mL via INTRAVENOUS

## 2019-05-09 ENCOUNTER — Encounter (INDEPENDENT_AMBULATORY_CARE_PROVIDER_SITE_OTHER): Payer: Self-pay

## 2019-05-10 ENCOUNTER — Ambulatory Visit: Payer: Medicare Other | Attending: Specialist

## 2019-05-10 DIAGNOSIS — R41844 Frontal lobe and executive function deficit: Secondary | ICD-10-CM | POA: Insufficient documentation

## 2019-05-10 DIAGNOSIS — Z9181 History of falling: Secondary | ICD-10-CM | POA: Insufficient documentation

## 2019-05-10 DIAGNOSIS — H5330 Unspecified disorder of binocular vision: Secondary | ICD-10-CM | POA: Insufficient documentation

## 2019-05-10 DIAGNOSIS — R278 Other lack of coordination: Secondary | ICD-10-CM | POA: Insufficient documentation

## 2019-05-10 DIAGNOSIS — R2689 Other abnormalities of gait and mobility: Secondary | ICD-10-CM | POA: Insufficient documentation

## 2019-05-14 ENCOUNTER — Telehealth (INDEPENDENT_AMBULATORY_CARE_PROVIDER_SITE_OTHER): Payer: Self-pay

## 2019-05-14 ENCOUNTER — Encounter (INDEPENDENT_AMBULATORY_CARE_PROVIDER_SITE_OTHER): Payer: Self-pay | Admitting: Internal Medicine

## 2019-05-14 NOTE — Telephone Encounter (Signed)
Pt wife LVM stating that they would like to speak with Dr. Thelma Barge to about changing pt from warfarin to xarelto.     Called pt to offer appointment with Dr. Thelma Barge    Pt scheduled for appointment on 05/20/19.
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
## 2019-05-17 NOTE — Rehab Progress Note ([facility]) (Signed)
Jonathan [name]  MRN: [id number]  Account: [id number]  Session Start: 05/17/2019 10:00:00 AM  Session Stop: 05/17/2019 10:55:00 AM    Total Treatment Minutes: 55.00 Minutes    Physical Therapy  Outpatient Treatment Note    Medical Diagnosis: Rank Code      Description    Date of Onset    1    S06.2X1   Diffuse traumatic brain injury with loss of      05/15/2019                 consciousness of 30 minutes or less  1    S06.2X1   Diffuse traumatic brain injury with loss of      05/16/2019                 consciousness of 30 minutes or less  Therapy Diagnosis:  Rank Code      Description     Date of Onset    1    Z91.81    History of falling                               05/15/2019  2    R27.8     Other lack of coordination                       05/15/2019  3    R26.8     Other abnormalities of gait and mobility         05/15/2019  Demographics:            Age: 74Y            Gender: Male    Rehabilitation Precautions/Restrictions:   Falls  Pacemaker  A-Fib on Coumadin  No driving    SUBJECTIVE  Patient Report: "These exercises are hard. I feel stupid that I can't do them."  Patient/Caregiver Goals: "To get back to exercising on my bike."  "To be able to walk around my neighborhood without losing my balance."  "To not be a burden to my family."  Pain: Patient currently without complaints of pain.    OBJECTIVE  General Observation: Pt received on-time in lobby with wife [name]) and sister  Boneta Lucks). Pt ambulated back to gym without AD, independently, slight shuffling  gait pattern noted. Pt and sister don masks for duration of evaluation. Hand-off  from OT.  Other/Additional Findings: Pt reported feeling tired at end of session.  BP: 106/70  HR: 60  SpO2: 97%    Interventions:       Therapeutic Activities:  .       Neuromuscular Reeducation:  .    Focus of session on improving dynamic balance and coordination.    TA:  -Initiated session with 6-minute walk test: pt ambulated 1,244 feet without a  device, independently. As  pt fatigued, noted slight stagger and decreased step  length on LLE with toe drag though pt was able to self-correct without physical  assistance and demonstrate improvements with verbal cuing.  -Education regarding walking outside at home: avoiding listening to music and  having conversations; focus on LLE (taking large step as he fatigues); walking  with family members. Pt will start with 1K walk this weekend.    NMRE: Remainder of session spent working on dynamic balance and coordination:  -tandem walk on foam pad: 12 laps  -agility ladder drills: two feet to a square; 1  foot to a square; side-stepping  -figure 4 on ground: clockwise, counter-clockwise, diagonals    Pain Reassessment: Pain was not reassessed as no pain was reported.  Education:    Education Provided: Precautions. Plan of care. Fall prevention/balance training.  Gait.       Audience: Patient and sister .       Mode: Explanation.  Demonstration.       Response: Verbalized understanding.  Needs practice.    ASSESSMENT  Pt became frustrated with balance and coordination activities that were  difficult. Educated pt regarding TBI diagnosis/NMRE/need to challenge pt within  sessions with continued education/re-enforcement recommended. As pt fatigues,  noted gait deviations with LLE mentioned above though improved with verbal cuing  and when distractions are limited. Pt with depressed mood and would benefit from  NP consult.    Activity/Participation Problem List and Goals: Other  Goal: STG: 10 visits from initial evaluation on 05/15/19: Pt will improve  self-selected gait speed from 0.70 m/s to 0.85 m/s to demonstrate a  statistically significant improvement and decrease fall risk (vanLoo and  Moseley, 2004).    LTG: 20 visits from initial evaluation on 05/15/19: Pt will improve  self-selected gait speed from 0.70 m/s to 1.0 m/s to allow pt to progress to a  safe community ambulator and decrease fall risk.    STG: 10 visits from initial evaluation on  05/15/19: Pt will improve FGA score  from 21/30 to 22/30 in order to demonstrate a decreased fall risk (Beninato et  al., 2014).    LTG: 20 visits from initial evaluation on 05/15/19: Pt will improve FGA score  from 21/30 to 25/30 to demonstrate improvement in dynamic balance and  independence with mobility within his home.    STG: 10 visits from initial evaluation on 05/15/19: Pt will ambulate 200 feet  independently while walking uphill and downhill without LOB in order to safely  attend medical appointments.    STG: 10 visits from initial evaluation on 05/15/19: Pt will improve 6 minute  walk test by 164 feet (the MCID) in order to demonstrate a clinically  significant improvement in endurance.  Status:  Functions/Structures Problem List and Goals: No updates at this time.    PLAN  Treatment Frequency, Duration, and Interventions: Continue Physical Therapy to  achieve goals per previously established Plan of Care.  Development of Plan of Care: There was no change to plan of care today. Patient  and/or family continue to be in agreement with plan of care.  The patient has been instructed to contact our clinic if any questions or  problems should arise.    Visit Number: Today's visit is number  2    Signed by: Bertis Ruddy, PT 05/17/2019 11:00:00 AM

## 2019-05-20 ENCOUNTER — Encounter (INDEPENDENT_AMBULATORY_CARE_PROVIDER_SITE_OTHER): Payer: Self-pay | Admitting: Internal Medicine

## 2019-05-20 ENCOUNTER — Telehealth (INDEPENDENT_AMBULATORY_CARE_PROVIDER_SITE_OTHER): Payer: Medicare Other | Admitting: Internal Medicine

## 2019-05-20 DIAGNOSIS — I482 Chronic atrial fibrillation, unspecified: Secondary | ICD-10-CM

## 2019-05-20 DIAGNOSIS — Z950 Presence of cardiac pacemaker: Secondary | ICD-10-CM

## 2019-05-20 DIAGNOSIS — I495 Sick sinus syndrome: Secondary | ICD-10-CM

## 2019-05-20 NOTE — Rehab Progress Note ([facility]) (Signed)
Jonathan [name] [name]  MRN: [id number]  Account: [id number]  Session Start: 05/17/2019 10:00:00 AM  Session Stop: 05/17/2019 10:56:00 AM    Total Treatment Minutes: 56.00 Minutes    Occupational Therapy  Outpatient Treatment Note    Medical Diagnosis: Rank Code      Description    Date of Onset    1    S06.2X1   Diffuse traumatic brain injury with loss of      05/15/2019                 consciousness of 30 minutes or less  1    S06.2X1   Diffuse traumatic brain injury with loss of      05/16/2019                 consciousness of 30 minutes or less  Therapy Diagnosis:  Rank Code      Description     Date of Onset    1    R27.8     Other lack of coordination                       05/16/2019  2    R41.844   Frontal lobe and executive function deficit      05/16/2019  3    H53.30    Unspecified disorder of binocular vision         05/16/2019  Referring Clinician: Dr Threasa Beards    Demographics:            Age: 69Y            Gender: Male    Rehabilitation Precautions/Restrictions:   no driving  falls    SUBJECTIVE  Patient Report: 'I have been working on not having double vision'  .  'I would rate myself as moderate to severely depressed'  .  Patient/Caregiver Goals: 1. return to driving    2. improve safety and endurance during exercise    3. improve memory  4. return to cooking  Pain: Patient currently without complaints of pain.    OBJECTIVE  General Observation: pt received on time in lobby. Sister, Boneta Lucks, present  throughout. +CLs and facemask. Casual S for supervision in an unfamiliar  environment    Interventions:       Neuromuscular Reeducation:       Physical Performance Test and Report:  Pt seen for tx w/ focus on  assessment of underlying skills required to safely access community at a mod I  level using BITS as noted below. Skills include visual scanning,  divided/alternating attention, visual motor response speed (VMRS), and sustained  task attention.  BITs User-Paced  Trial 1   Hand: BUE ; Time: 1 min ; VMRS: 1.20 sec  ; Hits: 49  Trial 2   Hand: BUE ; Time: 1 min ; VMRS: 1.24 sec ; Hits: 49  BITs User-Paced w/ Central Fixation  Trial 1  Time: 1 min ; VMRS: 1.24 ; Number of Changes: 7 ; Number Attended: 6 ; Number  Missed: 1 ;Targets: 48  BITs Bell Cancelation   Time: 1 min 35  ; Misses: 5 (on R of page) ; Distractor Hits: 0 ; Search  Pattern: L/R  Convergence Insufficiency Symptom Survey 26/60. Scores >21 indicate a  convergence insufficiency  Smooth pursuits intact  Convergenc 2" for 2/5 trials, unable to compelete 3/5 trials    BDI-II score 29 indicating moderate depression. Pt w/ cog impacting ability to  accurately complete and may benefit from re-trial of intervention    Pain Reassessment: Pain was not reassessed as no pain was reported.  Education:    Education Provided: Plan of care.       Audience: Patient.       Mode: Explanation.       Response: Verbalized understanding.    ASSESSMENT  pt demo's moderately impaired visual scanning and VMRS as measured by BITS,  impacting pt's safety in response to sudden hazards.   Pt becomes visually overwhelmed in dynamic and busy environments, impacting his  ability to complete grocery shopping and cooking  Pt score's Beck's Depression Index score indicates moderate level of impairment.  Pt has been referred to NPSY services. Per chart review pt is taking 2  antidepressents in medication regiment    Activity/Participation Problem List and Goals: No updates at this time.  Functions/Structures Problem List and Goals: No updates at this time.    PLAN  Treatment Frequency, Duration, and Interventions: Continue Occupational Therapy  to achieve goals per previously established Plan of Care.  Development of Plan of Care: There was no change to plan of care today. Patient  and/or family continue to be in agreement with plan of care.  The patient has been instructed to contact our clinic if any questions or  problems should arise.    Visit Number: Today's visit is number  2    Signed by: Particia Nearing, OTR/L 05/17/2019 10:00:00 AM
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
## 2019-05-22 NOTE — Rehab Progress Note ([facility]) (Signed)
Jonathan [name]  MRN: [id number]  Account: [id number]  Session Start: 05/22/2019 11:00:00 AM  Session Stop: 05/22/2019 11:55:00 AM    Total Treatment Minutes: 55.00 Minutes    Physical Therapy  Outpatient Treatment Note    Medical Diagnosis: Rank Code      Description    Date of Onset    1    S06.2X1   Diffuse traumatic brain injury with loss of      05/15/2019                 consciousness of 30 minutes or less  1    S06.2X1   Diffuse traumatic brain injury with loss of      05/16/2019                 consciousness of 30 minutes or less  Therapy Diagnosis:  Rank Code      Description     Date of Onset    1    Z91.81    History of falling                               05/15/2019  2    R27.8     Other lack of coordination                       05/15/2019  3    R26.8     Other abnormalities of gait and mobility         05/15/2019  Demographics:            Age: 59Y            Gender: Male    Rehabilitation Precautions/Restrictions:   Falls  Pacemaker  A-Fib on Coumadin  No driving    SUBJECTIVE  Patient Report: Pt reports diarrhea x8 days. He has not been able to eat yet  today and has been "forcing fluids" down. He is scheduled for an MD appointment  on Friday, 05/24/19.  Patient/Caregiver Goals: "To get back to exercising on my bike."  "To be able to walk around my neighborhood without losing my balance."  "To not be a burden to my family."  Pain: Patient currently without complaints of pain.    OBJECTIVE  General Observation: Pt received on-time in lobby with wife [name]) and sister  Boneta Lucks). Pt ambulated back to gym without AD, independently, slight shuffling  gait pattern noted. Pt and sister don masks for duration of evaluation. Hand-off  from OT.  Other/Additional Findings: Upon arrival: BP: 152/91; HR: 60  At end of session: BP: 135/85    Interventions:       Neuromuscular Reeducation:  .    NMRE: Pt completed the following dynamic balance/coordination activities in  hallway. Seated rest breaks provided every 5  minutes due to slight  "lightheadedness". Fluids encouraged throughout session. Pt required UE support  to stabilize self with braiding and ball toss activities.    -ball toss to self with fwd walk; 6 laps  -ball toss to therapist with fwd walk; 6 laps  -side stepping with ball toss; 6 laps  -side-stepping; 4 laps  -braiding with fwd cross; 8 laps  -tandem walk; 4 laps      Pain Reassessment: Pain was not reassessed as no pain was reported.  Education:    Education Provided: Precautions. Plan of care. Safety. Fall prevention/balance  training.  Audience: patient and sister, Boneta Lucks. .       Mode: Explanation.  Demonstration.       Response: Verbalized understanding.  Needs practice.  Needs reinforcement.    ASSESSMENT  Encouraged pt and sister to contact doctors office this afternoon to try to get  an earlier appt due to chronic diarrhea. Pt has difficulty with dual task and  dynamic balance/coordination activities requiring frequent UE support to  stabilize self. Plan to provide printed HEP next visit.    Activity/Participation Problem List and Goals: No updates at this time.  Functions/Structures Problem List and Goals: No updates at this time.    PLAN  Treatment Frequency, Duration, and Interventions: Continue Physical Therapy to  achieve goals per previously established Plan of Care.  Development of Plan of Care: There was no change to plan of care today. Patient  and/or family continue to be in agreement with plan of care.  The patient has been instructed to contact our clinic if any questions or  problems should arise.    Visit Number: Today's visit is number  3    Signed by: Bertis Ruddy, PT 05/22/2019 12:00:00 PM

## 2019-05-23 NOTE — Rehab Progress Note ([facility]) (Signed)
Jonathan [name]  MRN: [id number]  Account: [id number]  Session Start: 05/22/2019 10:00:00 AM  Session Stop: 05/22/2019 10:58:00 AM    Total Treatment Minutes: 58.00 Minutes    Occupational Therapy  Outpatient Treatment Note    Medical Diagnosis: Rank Code      Description    Date of Onset    1    S06.2X1   Diffuse traumatic brain injury with loss of      05/15/2019                 consciousness of 30 minutes or less  1    S06.2X1   Diffuse traumatic brain injury with loss of      05/16/2019                 consciousness of 30 minutes or less  Therapy Diagnosis:  Rank Code      Description     Date of Onset    1    R27.8     Other lack of coordination                       05/16/2019  2    R41.844   Frontal lobe and executive function deficit      05/16/2019  3    H53.30    Unspecified disorder of binocular vision         05/16/2019  Referring Clinician: Dr Threasa Beards    Demographics:            Age: 62Y            Gender: Male    Rehabilitation Precautions/Restrictions:   no driving  falls    SUBJECTIVE  Patient Report: 'Are you going to make me smarter?'  .  Patient/Caregiver Goals: 1. return to driving    2. improve safety and endurance during exercise    3. improve memory  4. return to cooking  Pain: Patient currently without complaints of pain.    OBJECTIVE  General Observation: pt received on time in lobby. Sister, Boneta Lucks, present  throughout. +CLs and facemask. Casual S for supervision in an unfamiliar  environment    Interventions:       Physical Performance Test and Report:  OT administers TVPS-3 in a quiet  environment free from distractions. Pt benefits from repetition of instructions  and at time visual modeling. Pt completes as noted below  Visual Short Term Memory 8/8  Visual Closure 13/13  Spatial Orientation 10/11  Visual Discrimination 5/5  Figure Ground 2/5  Visual Closure #2 3/5  Visual Short term Memory #2 0/5  Raw Score 51 = 37th percentile  Line bisection test completed with mild R milding shift  observed.  Discuss ongoing diarrhea, including speaking w/ MD urgently and possible BRAT  diet modifications to ensure some form of PO intake.    Pain Reassessment: Pain was not reassessed as no pain was reported.  Education:  Education Provided: see intervention       Audience: Patient. Sister       Mode: Explanation.       Response: Verbalized understanding.    ASSESSMENT  pt scored in the 37th percentile for visual perceptual skills. Scores may have  been impacted by visual or cogntive fatigue, as errors increased as hour  progressed. Will benefit from re-test at later date of those subtests at start  of a tx to assess impact of visual/cog fatigue on visual perceptual scores  Activity/Participation Problem List and Goals: No updates at this time.  Functions/Structures Problem List and Goals: No updates at this time.    PLAN  Treatment Frequency, Duration, and Interventions: Continue Occupational Therapy  to achieve goals per previously established Plan of Care.  Development of Plan of Care: There was no change to plan of care today. Patient  and/or family continue to be in agreement with plan of care.  The patient has been instructed to contact our clinic if any questions or  problems should arise.    Visit Number: Today's visit is number  3    Signed by: Particia Nearing, OTR/L 05/22/2019 11:00:00 AM

## 2019-05-24 NOTE — Rehab Progress Note ([facility]) (Signed)
NAMELADISLAV [name]  MRN: [id number]  Account: [id number]  Session Start: 05/24/2019 11:00:00 AM  Session Stop: 05/24/2019 11:55:00 AM    Total Treatment Minutes: 55.00 Minutes    Physical Therapy  Outpatient Treatment Note    Medical Diagnosis: Rank Code      Description    Date of Onset    1    S06.2X1   Diffuse traumatic brain injury with loss of      05/15/2019                 consciousness of 30 minutes or less  1    S06.2X1   Diffuse traumatic brain injury with loss of      05/16/2019                 consciousness of 30 minutes or less  Therapy Diagnosis:  Rank Code      Description     Date of Onset    1    Z91.81    History of falling                               05/15/2019  2    R27.8     Other lack of coordination                       05/15/2019  3    R26.8     Other abnormalities of gait and mobility         05/15/2019  Demographics:            Age: 60Y            Gender: Male    Rehabilitation Precautions/Restrictions:   Falls  Pacemaker  A-Fib on Coumadin  No driving    SUBJECTIVE  Patient Report: "That was a frustrated sigh. I should be doing better than  this."  Pt had an appt with MD yesterday regarding 10 days of poor appetite and  diarrhea. Prescribed medication and diet recommendations. Pt reporting able to  eat "bland" foods this morning.    Patient/Caregiver Goals: "To get back to exercising on my bike."  "To be able to walk around my neighborhood without losing my balance."  "To not be a burden to my family."  Pain: Patient currently without complaints of pain.    OBJECTIVE  General Observation: Pt received on-time in lobby with wife [name]) and sister  Boneta Lucks). Pt ambulated back to gym without AD, independently, slight shuffling  gait pattern noted. Pt and sister don masks for duration of evaluation. Hand-off  from OT.  Other/Additional Findings: BP: 133/83  HR: 60    Interventions:       Therapeutic Activities:  .       Neuromuscular Reeducation:  .  Focus of session on improving dynamic  balance and coordination.    TA:  -Pt ambulated outside on uneven brick pathways x600 feet without AD,  independently; occasional verbal cuing for LLE during swing phase due to poor  clearance and decreased step length (especially when ambulating uphill).  -Pt practiced negotiating 6 inch curb step x4 without AD, independently.  -Pt completed 6 minutes on treadmill; speed: 1.3-1.5 MPH; cuing for heel strike,  increased step length, walking quietly, and foot clearance on LLE during swing.  Noted significantly shorter step length and imbalance when ambulating with  single UE support; unable to walk without UE support.  NMRE: Pt completed the following dynamic balance/coordination activities outside  on uneven brick pathway.  Fluids encouraged throughout session.    -ball toss to self with fwd walk; 6 laps  -side-stepping; 4 laps  -braiding with fwd cross; 8 laps  -tandem walk; 4 laps  -high marching; 4 laps    Pain Reassessment: Pain was not reassessed as no pain was reported.  Education:    Education Provided: Fall prevention/balance training. Safety. Gait.       Audience: Patient.       Mode: Explanation.  Demonstration.       Response: Verbalized understanding.  Needs practice.    ASSESSMENT  Noted frustration with challenging dynamic balance tasks and treadmill as pt  believes he should be performing better than he is. Required UE support when  ambulating on treadmill. Will provide printed HEP next visit.    Activity/Participation Problem List and Goals: No updates at this time.  Functions/Structures Problem List and Goals: No updates at this time.    PLAN  Treatment Frequency, Duration, and Interventions: Continue Physical Therapy to  achieve goals per previously established Plan of Care.  Development of Plan of Care: There was no change to plan of care today. Patient  and/or family continue to be in agreement with plan of care.  The patient has been instructed to contact our clinic if any questions  or  problems should arise.    Visit Number: Today's visit is number  4    Signed by: Bertis Ruddy, PT 05/24/2019 12:00:00 PM

## 2019-05-27 NOTE — Rehab Progress Note ([facility]) (Signed)
Jonathan [name]  MRN: [id number]  Account: [id number]  Session Start: 05/24/2019 10:00:00 AM  Session Stop: 05/24/2019 10:55:00 AM    Total Treatment Minutes: 55.00 Minutes    Occupational Therapy  Outpatient Treatment Note    Medical Diagnosis: Rank Code      Description    Date of Onset    1    S06.2X1   Diffuse traumatic brain injury with loss of      05/15/2019                 consciousness of 30 minutes or less  1    S06.2X1   Diffuse traumatic brain injury with loss of      05/16/2019                 consciousness of 30 minutes or less  Therapy Diagnosis:  Rank Code      Description     Date of Onset    1    R27.8     Other lack of coordination                       05/16/2019  2    R41.844   Frontal lobe and executive function deficit      05/16/2019  3    H53.30    Unspecified disorder of binocular vision         05/16/2019  Referring Clinician: Dr Threasa Beards    Demographics:            Age: 44Y            Gender: Male    Rehabilitation Precautions/Restrictions:   no driving  falls    SUBJECTIVE  Patient Report: 'This is BS'  .  I used to have 63WPM in high school. This is terrible  .  Patient/Caregiver Goals: 1. return to driving    2. improve safety and endurance during exercise    3. improve memory  4. return to cooking  Pain: Patient currently without complaints of pain.    OBJECTIVE  General Observation: pt received on time in lobby. Sister, Boneta Lucks, present  throughout. +CLs and facemask. Casual S for supervision in an unfamiliar  environment    Interventions:       Self Care/Home Management:       Therapeutic Activities: tx w/ initial emphasis on electronic mgmt/IADLs and  fine motor coordination. Emphasis switches mid-tx based on pt's reponse to  intervention to visual perception and occular motilities. Please see tasks below    Typing Tests  [email or web address] 3 min test (transcribing)   22 WPM (AVG 25 for population)  [email or web address] 3 min test (typing over top)   28 WPM w/ 95%     [employer] Sc[employer] Hunt  pt  completes 17 of 17 questions in 35 minutes w/ overall 94% accuracy. Pt  becomes easily frusterated by technical difficulty of poor contrast on computer  screen. Verbal frusteration w/ poor response to cues for redirection, pacing,  frustration management, and task termination. OT attempts to terminate task x2  however pt refuses.    Scacadic Workbook 1  Pt 100% accurate for single and double digits and letters. Completes in timely  manner. Workbook presented on upright surface to increase ease of task  Parquetry  pt completes image 1 w/ 75% accuracy and total A to identify errors  pt completes image 2 w/ 100% accuracy and min A to recall instructions  pt completes image 3  w/ 100% accuracy and SBA      Pain Reassessment: Pain was not reassessed as no pain was reported.  Education:  Education Provided: Interior and spatial designer, self pacing       Audience: Patient. Sister       Mode: Explanation.       Response: Verbalized understanding. Needs practice    ASSESSMENT  pt w/ poor ability to identify frusteration, sources of frsteration, and use of  self pacing strategies. Pt unable to terminate task and borderline perceverative  on finishing despite calm attempts from OT to redirect task. Pt w/ impaired  direction following and STM compounding pt's frusteration w/ tasks. Will benefit  from NPSY intervention to compliment skilled OT intervention.    Activity/Participation Problem List and Goals: No updates at this time.  Functions/Structures Problem List and Goals: No updates at this time.    PLAN  Treatment Frequency, Duration, and Interventions: Continue Occupational Therapy  to achieve goals per previously established Plan of Care.  Development of Plan of Care: There was no change to plan of care today. Patient  and/or family continue to be in agreement with plan of care.  The patient has been instructed to contact our clinic if any questions or  problems should arise.    Visit Number: Today's visit is number  4    Signed by:  Particia Nearing, OTR/L 05/24/2019 11:00:00 AM

## 2019-05-28 MED ORDER — ROPIVACAINE HCL 5 MG/ML IJ SOLN
INTRAMUSCULAR | Status: AC
Start: 2019-05-28 — End: ?
  Filled 2019-05-28: qty 30

## 2019-05-28 MED ORDER — MIDAZOLAM HCL 1 MG/ML IJ SOLN (WRAP)
INTRAMUSCULAR | Status: AC
Start: 2019-05-28 — End: ?
  Filled 2019-05-28: qty 2

## 2019-05-28 MED ORDER — MEPIVACAINE HCL (PF) 2 % IJ SOLN
INTRAMUSCULAR | Status: AC
Start: 2019-05-28 — End: ?
  Filled 2019-05-28: qty 20

## 2019-05-29 ENCOUNTER — Ambulatory Visit
Admission: RE | Admit: 2019-05-29 | Discharge: 2019-05-29 | Disposition: A | Discharge status: Home / Self Care | Payer: Medicare Other | Source: Ambulatory Visit | Attending: Specialist | Admitting: Specialist

## 2019-05-29 DIAGNOSIS — I4891 Unspecified atrial fibrillation: Secondary | ICD-10-CM | POA: Insufficient documentation

## 2019-05-29 LAB — PT/INR
PT INR: 2.8 — ABNORMAL HIGH (ref 0.9–1.1)
PT: 29.9 s — ABNORMAL HIGH (ref 12.6–15.0)

## 2019-05-29 NOTE — Rehab Progress Note ([facility]) (Signed)
NAMEJIM [name]  MRN: [id number]  Account: [id number]  Session Start: 05/29/2019 10:05:00 AM  Session Stop: 05/29/2019 11:00:00 AM    Total Treatment Minutes: 55.00 Minutes    Physical Therapy  Outpatient Treatment Note    Medical Diagnosis: Rank Code      Description    Date of Onset    1    S06.2X1   Diffuse traumatic brain injury with loss of      05/15/2019                 consciousness of 30 minutes or less  1    S06.2X1   Diffuse traumatic brain injury with loss of      05/16/2019                 consciousness of 30 minutes or less  Therapy Diagnosis:  Rank Code      Description     Date of Onset    1    Z91.81    History of falling                               05/15/2019  2    R27.8     Other lack of coordination                       05/15/2019  3    R26.8     Other abnormalities of gait and mobility         05/15/2019  Demographics:            Age: 12Y            Gender: Male    Rehabilitation Precautions/Restrictions:   Falls  Pacemaker  A-Fib on Coumadin  No driving    SUBJECTIVE  Patient Report: Pt reporting a fall yesterday when standing in the bathroom on  one leg while trying to don his boxers.  Patient/Caregiver Goals: "To get back to exercising on my bike."  "To be able to walk around my neighborhood without losing my balance."  "To not be a burden to my family."  Pain: Patient currently has pain.  Patient reports a pain level of 2 out of 10. Repositioned patient.    OBJECTIVE  General Observation: Pt received on-time in lobby with wife [name]). Pt ambulated  back to gym without AD, independently, slight shuffling gait pattern noted. Pt  and wife don masks for duration of evaluation. Hand-off from OT.  Other/Additional Findings: Bruising and swelling to 4th digit on right finger  from fall experienced yesterday.    Interventions:       Therapeutic Activities:  .  Focus of session on improving dynamic balance and coordination.  TA:  -Pt completed a total of 14 minutes on treadmill with 3 standing  rest breaks  provided throughout, speed: 1.3-1.4 mph, verbal cuing for increasing step length  on LLE, heel strike on LLE, and avoiding shuffling pattern on LLE as UE support  was minimized/removed. Pt was able to walk x10 seconds without UE support with  deviations listed above and x20 seconds with single UE support with deviations  listed above.  -Agility ladder drills: both feet to a square leading with left leg (20 laps)  and leading with right leg (20 laps)  -[facility] weaving and making 180 degree turns (40 repetitions each activity);  difficulty pivoting on LLE with staggering noted 50% of the  time    Pain Reassessment:  Response to Pain Intervention: 2/10 pain  Post Intervention Pain Quality:  Tender.  Patient Reports Post Intervention Pain Level of: 2 out of 10  Pain Acceptable: Yes    ASSESSMENT  Pt reporting a fall yesterday when standing on one leg in bathroom when trying  to put on his Boxers. Injured Right 4th finger with bruising and swelling noted.  Wife notified MD during this session and MD recommended continued monitoring and  use of ice. Recommended pt sit down to don shoes/socks/pants/boxers for now. Pt  continues to express frustration during challenging activities and has  difficulty terminating a task until he feels like it is done perfectly. When  cued to "overshoot his step" with the left leg, pt is able to take an  appropriate step length and clear foot appropriately in swing phase. Plan to  trial uphill walking on treadmill next session to emphasize attention to left  leg.    Activity/Participation Problem List and Goals: No updates at this time.  Functions/Structures Problem List and Goals: No updates at this time.    PLAN  Treatment Frequency, Duration, and Interventions: Continue Physical Therapy to  achieve goals per previously established Plan of Care.  Development of Plan of Care: There was no change to plan of care today. Patient  and/or family continue to be in agreement with plan of  care.  The patient has been instructed to contact our clinic if any questions or  problems should arise.    Visit Number: Today's visit is number  5    Signed by: Bertis Ruddy, PT 05/29/2019 11:00:00 AM

## 2019-05-30 NOTE — Rehab Progress Note ([facility]) (Signed)
Jonathan [name]  MRN: [id number]  Account: [id number]  Session Start: 05/29/2019 9:00:00 AM  Session Stop: 05/29/2019 9:54:00 AM    Total Treatment Minutes: 54.00 Minutes    Occupational Therapy  Outpatient Treatment Note    Medical Diagnosis: Rank Code      Description    Date of Onset    1    S06.2X1   Diffuse traumatic brain injury with loss of      05/15/2019                 consciousness of 30 minutes or less  1    S06.2X1   Diffuse traumatic brain injury with loss of      05/16/2019                 consciousness of 30 minutes or less  Therapy Diagnosis:  Rank Code      Description     Date of Onset    1    R27.8     Other lack of coordination                       05/16/2019  2    R41.844   Frontal lobe and executive function deficit      05/16/2019  3    H53.30    Unspecified disorder of binocular vision         05/16/2019  Referring Clinician: Dr Threasa Beards    Demographics:            Age: 46Y            Gender: Male    Rehabilitation Precautions/Restrictions:   no driving  falls    SUBJECTIVE  Patient Report: 'I fell in the bathroom yesterday onto the tile. I was standing  and trying to put on my underwear. I think I hurt my hand, and my back hurts a  little"  .  Patient/Caregiver Goals: 1. return to driving    2. improve safety and endurance during exercise    3. improve memory  4. return to cooking  Pain: Patient currently without complaints of pain.    OBJECTIVE  General Observation: pt received on time in lobby. Sister, Boneta Lucks, present  throughout. +CLs and facemask. Casual S for supervision in an unfamiliar  environment    Interventions:       Therapeutic Activities:  OT completes evaluation of RUE and safety s/p fall on 10/28.  Note red/purpling discoloration at digit 4 RUE w/ edema    Edema:  RUE b/w MP and PIP 8 cm  at PIP 8 cm  4-5/10 pain w/ point pressure at PIP  LUE for norm 6.5 and 7  Noted red/purpling at digit 4    Edu to pt and spouse re: communicating fall to MD and texting picture of injury  to  digit 4 to improve understanding and serve as comparison. Edu on s/s  worsening (numbness, increased edema, change in temperature of digit etc).  Reinforce edu to sit for all LB dressing and bathing    Pt compeltes ADL 6-step sequencing cards as instructed by OT. Completes with  100% accuracy w/ sig increased time. Note concrete thinking impacting  performance/frusteration at times    2D to 3D block sequencing completed as instructed w/ 100% accuracy    Pain Reassessment: Pain was not reassessed as no pain was reported.  Education:  Education Provided: see intervention       Audience: Patient.  Spouse       Mode: Explanation.       Response: Verbalized understanding. Needs practice    ASSESSMENT  pt w/ impaired safety awareness, as measured by standing for LB dressing despite  edu from IP and OP. Intact constructional praxis as measuerd by 2D to 3D blocks.  Intact sequencing and ideational praxis as measured by 6-step sequencing cards.    Activity/Participation Problem List and Goals: No updates at this time.  Functions/Structures Problem List and Goals: No updates at this time.    PLAN  Treatment Frequency, Duration, and Interventions: Continue Occupational Therapy  to achieve goals per previously established Plan of Care.  Development of Plan of Care: There was no change to plan of care today. Patient  and/or family continue to be in agreement with plan of care.  The patient has been instructed to contact our clinic if any questions or  problems should arise.    Visit Number: Today's visit is number  5    Signed by: Particia Nearing, OTR/L 05/29/2019 10:00:00 AM

## 2019-05-31 NOTE — Rehab Progress Note ([facility]) (Signed)
Jonathan [name]  MRN: [id number]  Account: [id number]  Session Start: 05/31/2019 10:02:00 AM  Session Stop: 05/31/2019 11:00:00 AM    Total Treatment Minutes: 58.00 Minutes    Physical Therapy  Outpatient Treatment Note    Medical Diagnosis: Rank Code      Description    Date of Onset    1    S06.2X1   Diffuse traumatic brain injury with loss of      05/15/2019                 consciousness of 30 minutes or less  1    S06.2X1   Diffuse traumatic brain injury with loss of      05/16/2019                 consciousness of 30 minutes or less  Therapy Diagnosis:  Rank Code      Description     Date of Onset    1    Z91.81    History of falling                               05/15/2019  2    R27.8     Other lack of coordination                       05/15/2019  3    R26.8     Other abnormalities of gait and mobility         05/15/2019  Demographics:            Age: 12Y            Gender: Male    Rehabilitation Precautions/Restrictions:   Falls  Pacemaker  A-Fib on Coumadin  No driving    SUBJECTIVE  Patient Report: "I am terrified of falling and becoming a vegetable. Then my  wife would have to take care of me."  Patient/Caregiver Goals: "To get back to exercising on my bike."  "To be able to walk around my neighborhood without losing my balance."  "To not be a burden to my family."  Pain: Patient currently has pain.  Patient reports a pain level of 3 out of 10. Repositioned patient.    OBJECTIVE  General Observation: Pt received on-time in lobby with wife [name]). Pt ambulated  back to gym without AD, independently, slight shuffling gait pattern noted. Pt  and wife don masks for duration of evaluation. Hand-off from OT.  Other/Additional Findings: BP: 128/81  HR: 60  SpO2: 98%    Interventions:       Therapeutic Activities:  .    TA:  -20 minutes total on treadmill: 5 minutes of uphill walking (3-5% incline) with  BUE support; 15 minutes of walking on level surface with BUE support, UUE  support, and no arms support;  verbal cuing for increasing step length on LLE,  walking quietly, heel strike on LLE. Speed ranged from 1.2-1.6 mph.    -Stair training: pt reporting he would like to access his upright bicycle in the  basement but is now fearful of falling on the stairs. In lobby of hospital, pt  practiced negotiating 16 stairs with unilateral HR on L when ascending to  simulate home set-up. He completed a total of 10 trials. Pt ascends facing fwd  with reciprocal pattern with no LOB. When descending, he practiced facing  handrail and going down side-ways  and practiced facing fwd with reciprocal  pattern. He experienced 1 instance of L foot catching when descending though was  easily able to steady self with use of handrail. Discussed recommendation for  using handrail and wearing tennis shoes when negotiating stairs.    -HEP: provided pt with printed HEP for the following standing and dynamic  balance activities. All questions answered.  -SLS  -tandem stance  -tandem walk  -braiding      Pain Reassessment:  Response to Pain Intervention: 2/10 pain in R finger and sacral area  Post Intervention Pain Quality:  Tender.  Patient Reports Post Intervention Pain Level of: 2 out of 10  Pain Acceptable: Yes  Education:    Education Provided: Precautions. Plan of care. Fall prevention/balance training.  Home exercise/activity plan. Stair/curb/environmental barrier negotiation.       Audience: Patient.       Mode: Explanation.  Demonstration.  Teacher, English as a foreign language provided.       Response: Verbalized understanding.  Needs practice.    ASSESSMENT  During session, pt negotiated a total of 10 flights of stairs. He experienced 1  instance when his left foot caught on the step when descending though was easily  able to steady self with use of railing. Pt continues to report fear on stairs  and stated multiple times during session he is scared of "falling and becoming a  vegetable." Good improvement in stride length when walking on treadmill with  BUE  support; continued practice walking uphill recommended to emphasize LLE  placement and clearance in swing.    Activity/Participation Problem List and Goals: No updates at this time.  Functions/Structures Problem List and Goals: No updates at this time.    PLAN  Treatment Frequency, Duration, and Interventions: Continue Physical Therapy to  achieve goals per previously established Plan of Care.  Development of Plan of Care: There was no change to plan of care today. Patient  and/or family continue to be in agreement with plan of care.  The patient has been instructed to contact our clinic if any questions or  problems should arise.    Visit Number: Today's visit is number  6    Signed by: Bertis Ruddy, PT 05/31/2019 11:00:00 AM
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
## 2019-06-03 ENCOUNTER — Ambulatory Visit: Payer: Medicare Other | Attending: Specialist

## 2019-06-03 ENCOUNTER — Encounter (INDEPENDENT_AMBULATORY_CARE_PROVIDER_SITE_OTHER): Payer: Medicare Other | Admitting: Clinical Cardiac Electrophysiology

## 2019-06-03 DIAGNOSIS — H5330 Unspecified disorder of binocular vision: Secondary | ICD-10-CM | POA: Insufficient documentation

## 2019-06-03 DIAGNOSIS — R2689 Other abnormalities of gait and mobility: Secondary | ICD-10-CM | POA: Insufficient documentation

## 2019-06-03 DIAGNOSIS — Z9181 History of falling: Secondary | ICD-10-CM | POA: Insufficient documentation

## 2019-06-03 DIAGNOSIS — R41844 Frontal lobe and executive function deficit: Secondary | ICD-10-CM | POA: Insufficient documentation

## 2019-06-03 DIAGNOSIS — R278 Other lack of coordination: Secondary | ICD-10-CM | POA: Insufficient documentation

## 2019-06-04 NOTE — Rehab Progress Note ([facility]) (Signed)
NAMEYERICK GAYE  MRN: [id number]  Account: [id number]  Session Start: 05/31/2019 9:00:00 AM  Session Stop: 05/31/2019 9:57:00 AM    Total Treatment Minutes: 57.00 Minutes    Occupational Therapy  Outpatient Treatment Note    Medical Diagnosis: Rank Code      Description    Date of Onset    1    S06.2X1   Diffuse traumatic brain injury with loss of      05/15/2019                 consciousness of 30 minutes or less  1    S06.2X1   Diffuse traumatic brain injury with loss of      05/16/2019                 consciousness of 30 minutes or less  Therapy Diagnosis:  Rank Code      Description     Date of Onset    1    R27.8     Other lack of coordination                       05/16/2019  2    R41.844   Frontal lobe and executive function deficit      05/16/2019  3    H53.30    Unspecified disorder of binocular vision         05/16/2019  Referring Clinician: Dr Threasa Beards    Demographics:            Age: 64Y            Gender: Male    Rehabilitation Precautions/Restrictions:   no driving  falls    SUBJECTIVE  Patient Report: "I should have done better at these"  .  I don't watch football any more because I can't focus. My mind just thinks about  other things.  .  Patient/Caregiver Goals: 1. return to driving    2. improve safety and endurance during exercise    3. improve memory  4. return to cooking  Pain: Patient currently without complaints of pain.    OBJECTIVE  General Observation: . pt received on time in lobby. Handoff from and to spouse.  +CLs and facemask. Casual S for supervision in an unfamiliar environment.  Discoloration in R hand resolved    Interventions:       Therapeutic Activities:  Spot the Difference  OT instructs on novel visual scanning task, requiring him to visually  discriminate characteristics of items within a visually disorganized setting.  Completes Level 1 as follows  Trial 1: 3/5 : mod A to locate final 2 items  Trial 2: 6/6 w/ SBA and increased time  Trial 3: 4/5 w/ mod A to locate final item  pt  rating frustration level as grossly 8/10 dispite positive reinforcement, cues  for pacing, and education    [place]  OT instructs on novel tabletop task challenging near/far fixation, problem  solving, visual attention, coordination, frustration tolerance, and visual  scanning through [employer] ([place] w/ small colored balls in place of numbers). Pt  completes figure 1 w/ overall moderate assistance. Requires greater than  reasonable time and assistance to both identify and correct errors. Noticable  cog fatigue as task progresses, resulting in bell-curve performance    Pain Reassessment: Pain was not reassessed as no pain was reported.  Education:  Education Provided: see intervention       Audience: Patient.  Mode: Explanation.       Response: Verbalized understanding. Needs practice    ASSESSMENT  pt cont to be highly self critical of performance, w/ mood impacting overall  performance significantly. Cog fatigue impacting performance during tasks,  causing them to be mod A when otherwise would have required min A for pure  problem solving and scanning.    Activity/Participation Problem List and Goals: Other  Goal: LTG: (30 visits from 05/15/19): Pt will be grossly mod I for a.m. routine  including meal prep, bathing, and dressing in order to decrease burden of care  and return to meaningful roles/occupations  .  STG: (10 visits from 05/15/19): Pt will complete simple hot meal prep with no  more than SBA in order to demonstrate improving safety awareness and  planning/problem solving in order to decrease burden of care and return to  meaningful roles/occupations.  .  Status: stg will likely be discontinued as pt cont to report gastrointestinal  discomfort and diarreah which cause him to not want to eat/cook  .  Functions/Structures Problem List and Goals: No updates at this time.    PLAN  Treatment Frequency, Duration, and Interventions: Continue Occupational Therapy  to achieve goals per previously established  Plan of Care.  Development of Plan of Care: There was no change to plan of care today. Patient  and/or family continue to be in agreement with plan of care.  The patient has been instructed to contact our clinic if any questions or  problems should arise.    Visit Number: Today's visit is number  6    Signed by: Particia Nearing, OTR/L 05/31/2019 10:00:00 AM

## 2019-06-05 NOTE — Rehab Progress Note ([facility]) (Signed)
Jonathan [name]  MRN: [id number]  Account: [id number]  Session Start: 06/05/2019 10:05:00 AM  Session Stop: 06/05/2019 11:00:00 AM    Total Treatment Minutes: 55.00 Minutes    Physical Therapy  Outpatient Treatment Note    Medical Diagnosis: Rank Code      Description    Date of Onset    1    S06.2X1   Diffuse traumatic brain injury with loss of      05/15/2019                 consciousness of 30 minutes or less  1    S06.2X1   Diffuse traumatic brain injury with loss of      05/16/2019                 consciousness of 30 minutes or less  Therapy Diagnosis:  Rank Code      Description     Date of Onset    1    Z91.81    History of falling                               05/15/2019  2    R27.8     Other lack of coordination                       05/15/2019  3    R26.8     Other abnormalities of gait and mobility         05/15/2019  Demographics:            Age: 7Y            Gender: Male    Rehabilitation Precautions/Restrictions:   Falls  Pacemaker  A-Fib on Coumadin  No driving    SUBJECTIVE  Patient Report: "I am still having the diarrhea. My doctor is aware and I should  know more by the end of the week. I just feel tired and depleted because I can't  eat."  Patient/Caregiver Goals: "To get back to exercising on my bike."  "To be able to walk around my neighborhood without losing my balance."  "To not be a burden to my family."  Pain: Patient currently without complaints of pain.    OBJECTIVE  General Observation: Pt received on-time in lobby. Pt ambulated back to gym  without AD, independently, slight shuffling gait pattern noted. Pt dons mask for  duration of session.  Other/Additional Findings: At rest:  BP: 103/64; HR: 62  After activity:  BP: 112/74; HR: 63    Interventions:       Therapeutic Activities:  .       Neuromuscular Reeducation:  .    TA:  -15 minutes total on treadmill: 8 minutes of uphill walking (5% incline) with  BUE support; 7 minutes of walking on level surface with BUE support, UUE  support,  and no arms support; verbal cuing for increasing step length on LLE,  walking quietly, heel strike on LLE. Speed ranged from 1.2-1.6 mph.    NMRE:  Remainder of session focused on improving dynamic balance and coordination:  -figure 4: side-stepping, diagonal stepping, clock-wise and counter-clockwise  around figure 4  -rockerboard: 5 minutes with focus on controlled anterior/posterior sway    Pain Reassessment: Pain was not reassessed as no pain was reported.  Education:    Education Provided: Precautions. Plan of care. Fall prevention/balance training.  Safety.  Gait.       Audience: Patient.       Mode: Explanation.  Demonstration.       Response: Verbalized understanding.  Needs practice.    ASSESSMENT  Limited ability to control posterior weight-shift when on rockerboard noted. Pt  reports 2 falls have happened at home when he lost is balance leaning bkwd.  Continued therapy to focus on increasing limits of stability and postural  response, especially with posterior weight-shift.    Activity/Participation Problem List and Goals: No updates at this time.  Functions/Structures Problem List and Goals: No updates at this time.    PLAN  Treatment Frequency, Duration, and Interventions: Continue Physical Therapy to  achieve goals per previously established Plan of Care.  Development of Plan of Care: There was no change to plan of care today. Patient  and/or family continue to be in agreement with plan of care.  The patient has been instructed to contact our clinic if any questions or  problems should arise.    Visit Number: Today's visit is number  7    Signed by: Bertis Ruddy, PT 06/05/2019 11:00:00 AM

## 2019-06-07 NOTE — Psych (Signed)
NAMEJAQUAI OFFERMANN  MRN: [id number]  Account: [id number]  Session Start: 06/07/2019 9:00:00 AM  Session Stop: 06/07/2019 9:53:00 AM    Total Treatment Minutes: 53.00 Minutes    Psychology Services  Outpatient Rehabilitation Progress Note    Rehab Diagnosis: TBI  Demographics:            Age: 28Y            Gender: Male    Medications and Allergies: Significant rehabilitation considerations:   NKA  Rehabilitation Precautions/Restrictions:   No rehabilitation precautions.    SUBJECTIVE  Patient Reports: Pt reported the following:  Feeling self-critical  Feeling guilty  Feeling depressed    Suicide Risk Screen: Patient has these primary or secondary behavioral health  diagnoses or complaints: Describes self as mildly depressed. 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  Mood was depressed.  No evidence of  psychosis or delusions.  He explicitly denied SI and HI      Interventions:   NPSY INDIVID HLTH INTERV INT   NPSY INDIVID HLTH INTERV ADD    ASSESSMENT    Impressions:  Pt reported the following:  Feeling self-critical  Feeling guilty  Feeling depressed      PLAN  Continued Psychology services are recommended to address: Continue to follow  patient as needed for emotional support, assistance with coping skills.  Recommendations:  Weekly behavioral intervention    Signed by: Volanda Napoleon, Psy.D. 06/07/2019 9:53:00 AM

## 2019-06-07 NOTE — Rehab Progress Note ([facility]) (Signed)
Jonathan [name]  MRN: [id number]  Account: [id number]  Session Start: 06/07/2019 10:00:00 AM  Session Stop: 06/07/2019 10:55:00 AM    Total Treatment Minutes: 55.00 Minutes    Physical Therapy  Outpatient Treatment Note    Medical Diagnosis: Rank Code      Description    Date of Onset    1    S06.2X1   Diffuse traumatic brain injury with loss of      05/15/2019                 consciousness of 30 minutes or less  1    S06.2X1   Diffuse traumatic brain injury with loss of      05/16/2019                 consciousness of 30 minutes or less  Therapy Diagnosis:  Rank Code      Description     Date of Onset    1    Z91.81    History of falling                               05/15/2019  2    R27.8     Other lack of coordination                       05/15/2019  3    R26.8     Other abnormalities of gait and mobility         05/15/2019  Demographics:            Age: 66Y            Gender: Male    Rehabilitation Precautions/Restrictions:   falls  no driving  pacemaker  AFib on Coumadin    SUBJECTIVE  Patient Report: "My back pain is acting up. I have a herniated disc and spinal  stenosis."  Patient/Caregiver Goals: "To get back to exercising on my bike."  "To be able to walk around my neighborhood without losing my balance."  "To not be a burden to my family."  Pain: Patient currently has pain.  Patient reports a pain level of 3 out of 10. Patient medicated. Repositioned  patient.    OBJECTIVE  General Observation: Pt received on-time in lobby. Pt ambulated back to gym  without AD, independently, slight shuffling gait pattern noted. Pt dons mask for  duration of session.  Other/Additional Findings: BP: 116/70  HR: 60  SpO2: 100%    Interventions:       Neuromuscular Reeducation:  .       Therapeutic Activities:  .    Focus of session on improving dynamic balance and coordination.    NMR:  -rockerboard with anterior/posterior and right/left weight-shifting; 5 minutes  each direction  -foam balance beam tandem walking  -foam  balance beam side-stepping  -tandem walk on ground  -braiding with fwd cross    TA: Using skeleton and review of medical record in [facility], discussed rib fractures  from accident and herniated disc/spinal stenosis diagnoses. Pt reporting lumbar  flexion alleviated pain; pt completed 3x30 seconds seated forward flexion.      Pain Reassessment:  Response to Pain Intervention: 1/10 pain reported at end of session in low back  Post Intervention Pain Quality:  Tender.  Patient Reports Post Intervention Pain Level of: 1 out of 10  Pain Acceptable: Yes  Education:  Education Provided: Precautions. Plan of care. Safety. Fall prevention/balance  training.       Audience: Patient.       Mode: Explanation.  Demonstration.       Response: Verbalized understanding.  Needs practice.  Needs reinforcement.    ASSESSMENT  Exacerbation of low back pain limited pt's standing tolerance in therapy and  more frequent rest breaks were provided. Educated on doing seated fwd flexion  exercise to minimize pain. Pt continues to report feeling "depressed because of  his condition" May benefit from continued education regarding TBI and recovery.  Pt reports non-compliance with HEP and walking outdoors due to low energy levels  from 4 weeks of diarrhea and poor nutritional intake. Pt is awaiting results  from stool sample to determine potential cause of diarrhea. Chronic diarrhea,  LBP, and depressed mood impact pt's tolerance for therapy sessions and progress  in therapy.    Activity/Participation Problem List and Goals: No updates at this time.  Functions/Structures Problem List and Goals: No updates at this time.    PLAN  Treatment Frequency, Duration, and Interventions: Continue Physical Therapy to  achieve goals per previously established Plan of Care.  Development of Plan of Care: There was no change to plan of care today. Patient  and/or family continue to be in agreement with plan of care.  The patient has been instructed to contact our  clinic if any questions or  problems should arise.    Visit Number: Today's visit is number  8    Signed by: Bertis Ruddy, PT 06/07/2019 11:00:00 AM

## 2019-06-07 NOTE — Rehab Progress Note ([facility]) (Signed)
Jonathan [name]  MRN: [id number]  Account: [id number]  Session Start: 06/05/2019 11:00:00 AM  Session Stop: 06/05/2019 12:00:00 PM    Total Treatment Minutes: 60.00 Minutes    Occupational Therapy  Outpatient Treatment Note    Medical Diagnosis: Rank Code      Description    Date of Onset    1    S06.2X1   Diffuse traumatic brain injury with loss of      05/15/2019                 consciousness of 30 minutes or less  1    S06.2X1   Diffuse traumatic brain injury with loss of      05/16/2019                 consciousness of 30 minutes or less  Therapy Diagnosis:  Rank Code      Description     Date of Onset    1    R27.8     Other lack of coordination                       05/16/2019  2    R41.844   Frontal lobe and executive function deficit      05/16/2019  3    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  I gave the doctor a stool sample so hopefully they can tell me what's been going  on.  .  Patient/Caregiver Goals: 1. return to driving    2. improve safety and endurance during exercise    3. improve memory  4. return to cooking  Pain: Patient currently without complaints of pain.    OBJECTIVE  General Observation: . pt received on time in lobby. +CLs and facemask. Distant  S for mobility    Interventions:       Therapeutic Activities: challenge working memory, problem solving,  frusteration tollerance, and functional communication. OT instructs pt to  teach-back information/rules of below task which was completed prior tx. Pt able  to teach back grossly 90%.    [place]  Pt completes figure 1 again to assess carryover of strategies. OT allows for  error-based learning, w/ pt initiating strategy which would have assisted w/  speed/accuracy but losing track of strategy  mid-implimentation. Overall min  assistance to solve 75% of puzzle in 50 min, mod A to solve remaining 25% 2/2  time constraints.    OT reinforces self pacing and frustration mgmt strategies. Pt appears more  receptive on this date    Pain Reassessment: Pain was not reassessed as no pain was reported.  Education:  Education Provided: self pacing       Audience: Patient.       Mode: Explanation.       Response: Verbalized understanding. Needs practice    ASSESSMENT  pt cont to be highly self critical of performance, w/ mood impacting overall  performance significantly. Cog fatigue impacting performance during tasks,  causing them to be mod A when otherwise would have required min  A for pure  problem solving and scanning.    Activity/Participation Problem List and Goals: No updates at this time.  Functions/Structures Problem List and Goals: No updates at this time.    PLAN  Treatment Frequency, Duration, and Interventions: Continue Occupational Therapy  to achieve goals per previously established Plan of Care.  Development of Plan of Care: There was no change to plan of care today. Patient  and/or family continue to be in agreement with plan of care.  The patient has been instructed to contact our clinic if any questions or  problems should arise.    Visit Number: Today's visit is number  7    Signed by: Particia Nearing, OTR/L 06/05/2019 12:00:00 PM

## 2019-06-09 ENCOUNTER — Encounter (INDEPENDENT_AMBULATORY_CARE_PROVIDER_SITE_OTHER): Payer: Self-pay

## 2019-06-11 NOTE — Rehab Progress Note ([facility]) (Signed)
[name]  MRN: [id number]  Account: [id number]  Session Start: 06/07/2019 11:00:00 AM  Session Stop: 06/07/2019 11:56:00 AM    Total Treatment Minutes: 56.00 Minutes    Occupational Therapy  Outpatient Treatment Note    Medical Diagnosis: Rank Code      Description    Date of Onset    1    S06.2X1   Diffuse traumatic brain injury with loss of      05/15/2019                 consciousness of 30 minutes or less  1    S06.2X1   Diffuse traumatic brain injury with loss of      05/16/2019                 consciousness of 30 minutes or less  Therapy Diagnosis:  Rank Code      Description     Date of Onset    1    R27.8     Other lack of coordination                       05/16/2019  2    R41.844   Frontal lobe and executive function deficit      05/16/2019  3    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 I can't believe I  couldn't find all of the words though"  .  Patient/Caregiver Goals: 1. return to driving    2. improve safety and endurance during exercise    3. improve memory  4. return to cooking  Pain: Patient currently without complaints of pain.    OBJECTIVE  General Observation: . pt received on time in lobby. +CLs and facemask. Distant  S for mobility    Interventions:       Self Care/Home Management: pt seen for tx w/ focus on visual scanning,  visual endurance, and cognitive endurance via below tasks at table top level. OT  instructs on all tasks and provides calm, quiet setting to complete.    38M word jumble 4 min 26 sec w/ 100% accuracy  9M word jumble 6 min 53 sec w/ 100% accuracy    Vision scan, size 16 font, 3 pages in length  -pt instructed to locate 3 words within meaningful news story of pt's topic  selection.  -pt instructed to additionally  read for content  -pt requires increased time to locate words. pt able to locate 87.5% of words  with SBA and remaining words when cued which pages and number of ommissions.    Pt initiates bathroom and completes all toileting needs with distant S to  navigate to and from unfamiliar bathroom location within dynamic/crowded  environment    Pain Reassessment: Pain was not reassessed as no pain was reported.  Education:  Education Provided: self pacing, homework       Audience: Patient.       Mode: Explanation. Handouts. Demonstration       Response: Verbalized understanding. Needs practice    ASSESSMENT  pt issued h/o for [facility] continuing visual scanning. Frusteration does not top 3/10  throughout session. Pt does not c/o headaches or cog/visual fatigue. Pt appears  to benefit from calming atmosphere and setting expectations which overstate  difficulty of task/expectation to allow for feelings of success    Activity/Participation Problem List and Goals: No updates at this time.  Functions/Structures Problem List and Goals: No updates at this time.    PLAN  Treatment Frequency, Duration, and Interventions: Continue Occupational Therapy  to achieve goals per previously established Plan of Care.  Development of Plan of Care: There was no change to plan of care today. Patient  and/or family continue to be in agreement with plan of care.  The patient has been instructed to contact our clinic if any questions or  problems should arise.    Visit Number: Today's visit is number  8    Signed by: Particia Nearing, OTR/L 06/07/2019 12:00:00 PM

## 2019-06-12 ENCOUNTER — Ambulatory Visit
Admission: RE | Admit: 2019-06-12 | Discharge: 2019-06-12 | Disposition: A | Discharge status: Home / Self Care | Payer: Medicare Other | Source: Ambulatory Visit | Attending: Cardiovascular Disease | Admitting: Cardiovascular Disease

## 2019-06-12 DIAGNOSIS — I482 Chronic atrial fibrillation, unspecified: Secondary | ICD-10-CM | POA: Insufficient documentation

## 2019-06-12 LAB — PT/INR
PT INR: 3 — ABNORMAL HIGH (ref 0.9–1.1)
PT: 31.3 s — ABNORMAL HIGH (ref 12.6–15.0)

## 2019-06-12 NOTE — Rehab Progress Note ([facility]) (Signed)
[name]  MRN: [id number]  Account: [id number]  Session Start: 06/12/2019 10:05:00 AM  Session Stop: 06/12/2019 11:00:00 AM    Total Treatment Minutes: 55.00 Minutes    Physical Therapy  Outpatient Treatment Note    Medical Diagnosis: Rank Code      Description    Date of Onset    1    S06.2X1   Diffuse traumatic brain injury with loss of      05/15/2019                 consciousness of 30 minutes or less  1    S06.2X1   Diffuse traumatic brain injury with loss of      05/16/2019                 consciousness of 30 minutes or less  Therapy Diagnosis:  Rank Code      Description     Date of Onset    1    Z91.81    History of falling                               05/15/2019  2    R27.8     Other lack of coordination                       05/15/2019  3    R26.8     Other abnormalities of gait and mobility         05/15/2019  Demographics:            Age: 89Y            Gender: Male    Rehabilitation Precautions/Restrictions:   no driving  falls  pacemaker  Afib on Coumadin    SUBJECTIVE  Patient Report: Pt reports he did not eat this morning. He continues to have  diarrhea regularly. He has lost about 25 lbs. His PCP recommend he follow-up  with a GI doctor.  Pt reports to very little physical activity outside of therapy. He lays in bed  most of the day.  Patient/Caregiver Goals: "To get back to exercising on my bike."  "To be able to walk around my neighborhood without losing my balance."  "To not be a burden to my family."  Pain: Patient currently without complaints of pain.    OBJECTIVE  General Observation: Pt received on-time in lobby. Pt ambulated back to gym  without AD, independently, slight shuffling gait pattern noted. Pt dons mask and  glasses for duration of session.  Other/Additional Findings: BP: 131/81  HR: 57  SPO2: 99%    Interventions:       Therapeutic Activities:  .       Neuromuscular Reeducation:  .    TA:  -15 minutes total on treadmill: 8 minutes of uphill walking (5% incline) with  BUE  support; 7 minutes of walking on level surface with BUE support, UUE  support, and no arms support; verbal cuing for increasing step length on LLE,  walking quietly, heel strike on LLE. Speed ranged from 1.2-1.6 mph. All  ambulation completed with close supervision.    NMRE:  -stepping over orange hurdles: fwd and side-stepping; pt knocked over hurdle 5%  of the time  -4-square stepping: fwd/bkwd/side-stepping/diagonal; when stepping bkwd, noted  occasional mild LOB which pt self-corrects with taking multiple, small steps. No  physical assistance provided.  Pain Reassessment: Pain was not reassessed as no pain was reported.  Education:    Education Provided: Precautions. Plan of care. Fall prevention/balance training.         Audience: Patient.       Mode: Explanation.  Demonstration.       Response: Verbalized understanding.  Needs practice.    ASSESSMENT  Negative internal self-talk (pt reports to thinking his performance is pathetic)  impacts performance and causes increased stumbling and LOB. Pt's low mood, lack  of confidence with mobility, fear of falling, and concern for being a burden on  his wife lead pt to spend most of his time in bed. Re-assessment of outcome  measures next visit.    Activity/Participation Problem List and Goals: No updates at this time.  Functions/Structures Problem List and Goals: No updates at this time.    PLAN  Treatment Frequency, Duration, and Interventions: Continue Physical Therapy to  achieve goals per previously established Plan of Care.  Development of Plan of Care: There was no change to plan of care today. Patient  and/or family continue to be in agreement with plan of care.  The patient has been instructed to contact our clinic if any questions or  problems should arise.    Visit Number: Today's visit is number  9    Signed by: Bertis Ruddy, PT 06/12/2019 11:00:00 AM

## 2019-06-14 NOTE — Rehab Progress Note ([facility]) (Signed)
Jonathan [name]  MRN: [id number]  Account: [id number]  Session Start: 06/12/2019 11:00:00 AM  Session Stop: 06/12/2019 11:55:00 AM    Total Treatment Minutes: 55.00 Minutes    Occupational Therapy  Outpatient Treatment Note    Medical Diagnosis: Rank Code      Description    Date of Onset    1    S06.2X1   Diffuse traumatic brain injury with loss of      05/15/2019                 consciousness of 30 minutes or less  1    S06.2X1   Diffuse traumatic brain injury with loss of      05/16/2019                 consciousness of 30 minutes or less  Therapy Diagnosis:  Rank Code      Description     Date of Onset    1    R27.8     Other lack of coordination                       05/16/2019  2    R41.844   Frontal lobe and executive function deficit      05/16/2019  3    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 The last time I ate was lunch yesterday  .  I forgot to do my homework  .  Patient/Caregiver Goals: 1. return to driving    2. improve safety and endurance during exercise    3. improve memory  4. return to cooking  Pain: Patient currently without complaints of pain.    OBJECTIVE  General Observation: . pt received on time in lobby. +CLs and facemask. Distant  S for mobility    Interventions:       Therapeutic Activities: pt did not complete homework as he forgot. OT  provides mod cues to initate putting reminder in phone to complete homework,  which was preferred method prior to TBI. Able to recall steps/sequence  independently    [place]u  Pt completes figure 2. Improved use of strategies and carryover on this date.  Improved frustration tolerance w/ frusteration not exceeding 3/10. Overall min  assistance to solve   puzzle in 40 min.    Pain Reassessment: Pain was not reassessed as no pain was reported.  Education:  Education Provided: homework       Audience: Patient.       Mode: Explanation. Handouts. Demonstration       Response: Verbalized understanding. Needs practice    ASSESSMENT  pt cont to be limited by emotions and internal distractions. Improved  frustration tolerance. Requires cues for memory strategies though unclear if  this is 2/2 lack of motivation in presence of depression v. cognition. Plan for  POC update during upcoming tx    Activity/Participation Problem List and Goals: No updates at this time.  Functions/Structures Problem List and Goals: No updates at  this time.    PLAN  Treatment Frequency, Duration, and Interventions: Continue Occupational Therapy  to achieve goals per previously established Plan of Care.  Development of Plan of Care: There was no change to plan of care today. Patient  and/or family continue to be in agreement with plan of care.  The patient has been instructed to contact our clinic if any questions or  problems should arise.    Visit Number: Today's visit is number  9    Signed by: Particia Nearing, OTR/L 06/12/2019 12:00:00 PM

## 2019-06-17 ENCOUNTER — Encounter (INDEPENDENT_AMBULATORY_CARE_PROVIDER_SITE_OTHER): Payer: Self-pay | Admitting: Gastroenterology

## 2019-06-17 ENCOUNTER — Telehealth (INDEPENDENT_AMBULATORY_CARE_PROVIDER_SITE_OTHER): Payer: Self-pay

## 2019-06-17 NOTE — Rehab Progress Note ([facility]) (Signed)
NAMETYRECE [name]  MRN: [id number]  Account: [id number]  Session Start: 06/14/2019 11:00:00 AM  Session Stop: 06/14/2019 12:00:00 PM    Total Treatment Minutes: 60.00 Minutes    Physical Therapy  Outpatient Progress Summary    Medical Diagnosis: Rank Code      Description    Date of Onset    1    S06.2X1   Diffuse traumatic brain injury with loss of      05/15/2019                 consciousness of 30 minutes or less  1    S06.2X1   Diffuse traumatic brain injury with loss of      05/16/2019                 consciousness of 30 minutes or less  Therapy Diagnosis:  Rank Code      Description     Date of Onset    1    Z91.81    History of falling                               05/15/2019  2    R27.8     Other lack of coordination                       05/15/2019  3    R26.8     Other abnormalities of gait and mobility         05/15/2019  Demographics:              Date of Birth: Jul 21, 1951   Age: 49Y   Gender: Male  Medications and Allergies: Significant rehabilitation considerations:   NKA  Rehabilitation Precautions/Restrictions:   no driving  falls  Pacemaker  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 I don't want to be too far from a bathroom."  Patient/Caregiver Goals: "To get back to exercising on my bike."  "To be able to walk around my neighborhood without losing my balance."  "To not be a burden to my family."  Pain: Patient currently without complaints of pain.  General Observation: Pt received on-time in lobby. Pt ambulated back to gym  without AD, independently, slight shuffling gait pattern noted. Pt dons mask and  glasses for duration of session. Pt's wife, [name], present for session.    Interventions:       Physical Performance Test with Report: Focus of session on re-assessment of  outcome measures, discussion of progress in PT, and plan of care. Pt and  wife,  [name], actively engaged in therapy session. All questions were addressed.    Special Tests:  Sharlene Motts: 56/56 (was 51/56 on 05/15/19)  FGA: 25/30 (was 21/30 on 05/15/19)  10 meter walk test:  Self-selected speed: 1.2 m/s (was 0.70 m/s on 05/15/19)  Fast speed: 1.79 m/s (was 1.06 m/s on 05/15/19)    Berg Balance Scale:  1. Sitting to Standing: 4 - able to stand without using hands and stabilize  independently  2. Arises: 4 - able to stand safely 2 minutes  3. Sitting Unsupported: 4- did not perform based on ability to stand 2 minutes  unsupported.  4. Standing to Sitting: 4 - sits safely with minimal use of hands  5. Transfers: 4 - able to transfer safely with minor use of hands  6. Standing Unsupported with Eyes Closed: 4 - able to stand 10 seconds safely  7. Standing with Feet Together: 4 - able to place feet together independently  and stand 1 minute safely  8. Reaching Forward in Standing: 4 - can reach forward confidently > 25cm/10in  9. Picking up Objects from Standing: 4 - able to pick up slipper safely and  easily  10. Turning to Look 4 - looks behind from both sides and weight shifts well  11. Turning 360 Degrees: 4 - able to turn 360 degrees safely in 4 seconds or  less  12. Alternating Foot on Step: 4 - able to stand independently/safely /T/  complete 8 steps in 20 seconds  13. Standing Unsupported One Foot in Front: 4 - able to place foot tandem  independently and hold 30 seconds  14. Standing On One Leg: 4 - able to lift leg independently and hold > 10  seconds  Total Score: 56   Standing balance within normal limits per assessment  [name], Wood-Dauphinee S, Williams JI, Milroy D: Measuring balance in the  elderly: Preliminary development of an instrument. Physiotherapy [place],  [id number], 1989.    Functional Gait Assessment:       GAIT LEVEL SURFACE: Normal - walks 52ft less than 5.5 sec, no assistive  device, good speed, no evidence of imbalance, normal gait, deviates no more than  6in outside 12in  walkway width       CHANGE IN GAIT SPEED: Normal - able to smoothly change walking speed w/o  loss of balance or gait deviation. Significant difference in walking speeds  normal/fast/slow. Deviates no more than 6in outside 12in wlkwy       GAIT WITH HORIZONTAL HEAD TURNS: Normal - performs head turns smoothly with  no change in gait. Deviate no more than 6in outside 12in walkway       GAIT WITH VERTICAL HEAD TURNS: Normal - performs with no change in gait,  deviates no more than 6in outside 12in walkway       GAIT AND PIVOT TURN: Normal - pivot turns safely w/in 3 sec and stops  quickly with no loss of balance.       STEP OVER OBSTACLE: Normal - is able to step over 2 stacked shoe boxes  taped together w/o changing gait speed; no evidence of imbalance       GAIT WITH NARROW BASE OF SUPPORT: Moderate impairment - ambulates heel/toe  4-7 steps       GAIT WITH EYES CLOSED: Moderate impairment - walks 75ft, slow speed,  abnormal gait pattern, evidence for imbalance, deviates 10-15in outside 12in  walkway. Require more than 9 sec to ambulate 20 ft       AMBULATING BACKWARDS: Normal - walks 52ft, no assistive device, good speed,  no evidence for imbalance, normal gait pattern, deviate no more than 6in outside  12in walkway       STEPS: Mild impairment - alternating feet, must use rail  TOTAL SCORE: 25 /30    Pain Reassessment: Pain was not reassessed as no pain was reported.  Education:    Education Provided: Precautions. Plan of care. Bed mobility. Functional  transfers. Safety. Fall prevention/balance training. Gait.  Stair/curb/environmental barrier negotiation.       Audience: Patient and significant other.       Mode: Explanation.  Demonstration.       Response: Verbalized understanding.  Needs practice.    Number of Visits to Date: Today's visit is number  10    Activity/Participation Problem List and Goals: Other  Goal: STG: 10 visits from initial evaluation on 05/15/19: Pt will improve  self-selected gait speed  from 0.70 m/s to 0.85 m/s to demonstrate a  statistically significant improvement and decrease fall risk (vanLoo and  Moseley, 2004).  GOAL MET    LTG: 20 visits from initial evaluation on 05/15/19: Pt will improve  self-selected gait speed from 0.70 m/s to 1.0 m/s to allow pt to progress to a  safe community ambulator and decrease fall risk.  GOAL MET    STG: 10 visits from initial evaluation on 05/15/19: Pt will improve FGA score  from 21/30 to 22/30 in order to demonstrate a decreased fall risk (Beninato et  al., 2014).  GOAL MET    LTG: 20 visits from initial evaluation on 05/15/19: Pt will improve FGA score  from 21/30 to 26/30 to demonstrate improvement in dynamic balance and  independence with mobility within his home.  GOAL NOT MET    STG: 10 visits from initial evaluation on 05/15/19: Pt will ambulate 200 feet  independently while walking uphill and downhill without LOB in order to safely  attend medical appointments.  GOAL MET    STG: 10 visits from initial evaluation on 05/15/19: Pt will improve 6 minute  walk test by 164 feet (the MCID) in order to demonstrate a clinically  significant improvement in endurance.  NOT ASSESSED DUE TO TIME RESTRAINTS    __________________________________________________________________  Updated goals:    LTG: 10 visits from 06/14/19: Pt will improve FGA score from 21/30 to 26/30 to  demonstrate improvement in dynamic balance and independence with mobility within  his home.    LTG: 10 visits from 06/14/19: Pt will improve 6 minute walk test by 164 feet  (the MCID) in order to demonstrate a clinically significant improvement in  endurance.    LTG: 10 visits from 06/14/19: Pt will negotiate 1 flight of stairs with  reciprocal pattern while carrying a 5lb object independently in order to allow  pt to safely carry laundry up and down the stairs at home.  Status: Pt has made very good progress during the first 10 visits of his PT plan  of care. He has made clinically significant  improvements in his gait speed  (self-selected and fast) and dynamic balance. Pt continues to report decreased  confidence and difficulty when negotiating stairs without handrails so a new LTG  to address this functional limitation was added.  Functions/Structures Problem List and Goals:  Other: STG: 10 visits from initial evaluation on 05/15/19:  Pt will improve L  ankle DF MMT from 4/5 to 5/5 to improve foot clearance and prevent LOB when pt  descending hills and when pt is fatigued.  GOAL MET    LTG: 20 visits from initial evaluation on 05/15/19: Pt will improve L hip  extension MMT from 4/5 to 5/5 to improve activity tolerance and ambulation  distance so pt can attend MD and therapy appointments safely.  GOAL NOT MET; L hip extension MMT still 4/5  __________________________________________________________________  Updated Goals    STG: 10 visits from 06/14/19: Pt will improve L hip extension MMT from 4/5 to  5/5 to improve activity tolerance and ambulation distance so pt can attend MD  and therapy appointments safely.  Status: Pt has demonstrated improvement L ankle DF MMT to 5/5. He would benefit  from continued  hip extension strengthening on L to improve ambulation distance  and activity tolerance.    Progress Summary: Mr. Wileman has made good progress in outpatient PT during the  first 10 visits of his plan of care. He has demonstrated clinically significant  improvements in gait speed (self-selected and fast) and dynamic balance. Pt  continues to require use of handrail when negotiating stairs within his home and  is unable to safely carry objects up/down the stairs. He has experienced 1 fall  over the past month while standing to put on his pants. Further, he is unable to  safely incorporate dual-task activities to his mobility as his balance is  negatively affected. Pt has experienced chronic diarrhea and depressed mood  which have negatively impacted his ability to complete recommendations outside  of  therapy (HEP regularly and practice community ambulation with wife). These  are being addressed by PCP/GI doctor and Neuropsycholgosist respectively. He  would benefit from 10 additional outpatient PT visits in order to improve  coordination, dual-task activities, and stair negotiation to reduce fall risk  within the home.      Equipment Provided/Recommended: No equipment was issued or recommended.  Rehabilitation Potential:  Patient is improving in response to therapy.  Maximum improvement is yet to be attained.  Necessity: Patient requires outpatient, skilled physical therapy in order to  maximize independence and safety while reducing burden of care during functional  mobility in the home.  Patient requires physical therapy to reduce fall risk, improve muscle  coordination, and improvement functional mobility and independence  Recommended Consults: None currently.  Recommendations: Continue Physical Therapy to achieve goals per previously  established Plan of Care.    Physician Certification: This is to certify that the above named patient, who is  under my care, requires skilled Physical Therapy services as described in the  above treatment plan.  I further certify that the services outlined in this plan  are skilled and medically necessary.  I have reviewed this plan for  rehabilitation services, and I recommend that these services continue for 10  visits from 06/14/19  to meet the goals stated above.    Physician signature: __________________ MD,  Date of certification:  ____/____/____    Signed by: Bertis Ruddy, PT 06/14/2019 12:00:00 PM

## 2019-06-17 NOTE — Rehab Progress Note ([facility]) (Signed)
Jonathan [name]  MRN: [id number]  Account: [id number]  Session Start: 06/14/2019 10:00:00 AM  Session Stop: 06/14/2019 10:58:00 AM    Total Treatment Minutes: 58.00 Minutes    Occupational Therapy  Outpatient Progress Summary    Medical Diagnosis: Rank Code      Description    Date of Onset    1    S06.2X1   Diffuse traumatic brain injury with loss of      05/15/2019                 consciousness of 30 minutes or less  1    S06.2X1   Diffuse traumatic brain injury with loss of      05/16/2019                 consciousness of 30 minutes or less  Therapy Diagnosis:  Rank Code      Description     Date of Onset    1    R27.8     Other lack of coordination                       05/16/2019  2    R41.844   Frontal lobe and executive function deficit      05/16/2019  3    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 I don't want you to kick me out (of  therapy)"  Patient/Caregiver Goals: 1. return to driving    2. improve safety and endurance during exercise    3. improve memory  4. return to cooking  Pain: Patient currently without complaints of pain.  General Observation: pt received on time in lobby. Indep for mobility w/o AD.  +facemask  Interventions:       Therapeutic Activities:       Physical Performance Test and Report:    pt introduced to novel visual perceptual, coordination, executive functioning,  divided/alternating attention, and frustration tolerance tasks via ipad. Pt  completes with initially verbal cues and visual demonstration, fading to SBA  1. Flow Free  2. Rush Hour  3. Mem Match  4. Spot the Difference    Please see below for objective tests and measures. OT interprets all results for  pt in relation to his goals  and pt's self perception of 'getting worse'. Pt  receptive to edu that in terms of below measures pt is continuing to improve. Pt  cont to benefit from NPSY intervention to address mood/emotions.    Box and Blocks  PREVIOUS   RUE 49 (norm 68.4)       LUE 46 (norm 67.9)  CURRENT   RUE 62 (norm 68.4)       LUE 52 (norm 67.9)    CURRENT Grip Strength  Left Grip Average: 115 pounds.   Left average for age and gender is 76.8 pounds.    Right Grip Average: 112 pounds.   Right average for age and gender is 91.1 pounds.    PREVIOUS Grip Strength    Right Grip Average: 106.67 pounds.  Left Grip Average: 111.67 pounds.  CURRENT Nine Hole Peg Test:  Right  Hand Score(s):  18 seconds; Average for age and gender is 20.7 seconds.  Left  Hand Score(s):  17 seconds Average for age and gender is 22.9 seconds.    PREVIOUS Nine Hole Peg Test:  Right Hand Score(s):  23 seconds  Left Hand Score(s):  26 seconds    Pain Reassessment: Pain was not reassessed as no pain was reported.  Education:    Education Provided: Plan of care. ipad apps .       Audience: Patient.       Mode: Explanation.  Demonstration.  Teacher, English as a foreign language provided.       Response: Applied knowledge.  Verbalized understanding.   may need practice    Initial Evaluation Date: 05/15/2019 11:00:00 AM  Reporting Period: 05/15/19 through 06/14/19  Number of Visits to Date: Today's visit is number  10    Activity/Participation Problem List and Goals: Other  Goal: LTG: (30 visits from 05/15/19): Pt will be grossly mod I for a.m. routine  including meal prep, bathing, and dressing in order to decrease burden of care  and return to meaningful roles/occupations. GOAL IN PROGRESS  .  STG: (10 visits from 05/15/19): Pt will complete simple hot meal prep with no  more than SBA in order to demonstrate improving safety awareness and  planning/problem solving in order to decrease burden of care and return to  meaningful roles/occupations. GOAL MET  .  UPDATED GOALS  .  LTG: (20 visits  from 06/14/19): Pt will be grossly mod I for a.m. routine  including meal prep, bathing, and dressing in order to decrease burden of care  and return to meaningful roles/occupations  .  STG: (10 visits from 06/14/19): Pt will complete internet scavenger hunt with  100% accuracy in order to demonstrate improving ability to navigate the web and  opperate computer as needed to communicate electronically with loved ones and  medical providers  .  Status: pt met 1/1 STGs. In progress towards LTG. Mood serves as barrier to goal  .  Functions/Structures Problem List and Goals:  Other: LTG: (30 visits from 05/15/19): Pt will tolerate 40 minutes of continuous  reading without reporting significant occular or cognitive fatiguein order to  return to leisure reading and ability to review medical appointments  .  STG: (10 visits from 05/15/19): Pt will tolerate 15 minutes of continuous  reading without reporting significant occular or cognitive fatigue in order to  return to leisure reading and ability to review medical appointments. GOAL MET.  .  STG: (10 visits from 05/15/19): Pt will improve LUE GMC by 10 blocks or more in  order to demonstrate improving gross motor coordination needed to complete  household management tasks. GOAL EXCEEDED  .  STG: (10 visits from 05/15/19): Pt will improve RUE GMC by 10 blocks or more in  order to demonstrate improving gross motor coordination needed to complete  household management tasks.  GOAL EXCEEDED  .  UPDATED GOALS  .  LTG: (20 visits from 06/14/19): Pt will tolerate 40 minutes of continuous  reading without reporting significant occular or cognitive fatiguein order to  return to leisure reading and ability to review medical appointments  .  STG: (10 visits from 06/14/19): Pt will demonstrate improved frusteration  tolerance and healthy coping mechanisms/self pacing strategies as measured by  completing a challenging visual perceptual/cognitive task (e.g. Colorku, ipad  activity,  etc) as self rated with no more than 2/10 frustration over a  50 min  time span  .  Status: pt met or exceeded all STGs. Goal added to reflect progress and needs    Progress Summary: 'Jonathan [name]' met or exceeded all goals. Cooking goals have been  discontinued as appropriate in presence of GI symptoms and pt's reported  inability to eat or look at food, but may be restored if GI symptoms resolve. Pt  demonstrates overall WFL or above-norms GMC, FMC, and strength however he  continues to report symptoms of cognitive an physical fatigue. Residual TBI  symptoms likely exacerbated by mood (depression, anxiety, self criticism etc).  Mood additionally serves as barrier to goals and inadvertent disengagement from  meaningful roles/occupations and may impact POC. Jonathan [name] put forth wonderful  effort on this date and was receptive to edu re: self pacing and progress though  continues to benefit from supervision and cues to prevent 8+/10 frusteration and  feelings of failure which further his disengagement.  Equipment Provided/Recommended: No equipment was issued or recommended.  Rehabilitation Potential: Patient?s condition has potential to improve.  Patient is improving in response to therapy.  Maximum improvement is yet to be attained.  Necessity: Patient requires outpatient therapy in order to reduce Activities of  Daily Living or Instrumental Activities of Daily Living assistance to a  premorbid level.  Patient requires occupational therapy plan in order to function in community.  Patient requires occupational therapy to reduce fall risk, improve muscle  coordination, and improve functional mobility and independence  Recommended Consults:   psychiatry  Recommendations: Occupational Therapy is recommended for 2x/week for 20 visits  from 06/14/19 for 1:1 and group OT Occupational Therapy treatment is to include:  Manual Therapy.  Neuromuscular Re-education.  Physical Performance Test.  Therapeutic Activity.  Therapeutic  Exercise.  Self Care/Home Management.  Development of Cognitive Skills.  Assisted Technology Assessment.  Group Therapeutic Procedure.  Hot Cold Pack.  TENS Unit.  Ultrasound.  Paraffin Bath.  Contrast Bath.  Sensory Integration.  Biofeedback Training.  Community/Work Integration.    Physician Certification: This is to certify that the above named patient, who is  under my care, requires skilled Occupational Therapy services as described in  the above treatment plan.  I further certify that the services outlined in this  plan are skilled and medically necessary.  I have reviewed this plan for  rehabilitation services, and I recommend that these services continue 90 days  from 06/14/19  to meet the goals stated above.    Physician signature: __________________ MD,  Date of certification:  ____/____/____    Signed by: Particia Nearing, OTR/L 06/14/2019 11:00:00 AM

## 2019-06-17 NOTE — Telephone Encounter (Signed)
Lvm for pt to call back. 

## 2019-06-18 ENCOUNTER — Telehealth (INDEPENDENT_AMBULATORY_CARE_PROVIDER_SITE_OTHER): Payer: Self-pay

## 2019-06-18 ENCOUNTER — Telehealth (INDEPENDENT_AMBULATORY_CARE_PROVIDER_SITE_OTHER): Payer: Medicare Other | Admitting: Gastroenterology

## 2019-06-18 ENCOUNTER — Encounter (INDEPENDENT_AMBULATORY_CARE_PROVIDER_SITE_OTHER): Payer: Self-pay

## 2019-06-18 ENCOUNTER — Encounter (INDEPENDENT_AMBULATORY_CARE_PROVIDER_SITE_OTHER): Payer: Self-pay | Admitting: Gastroenterology

## 2019-06-18 VITALS — Ht 73.0 in | Wt 190.0 lb

## 2019-06-18 DIAGNOSIS — R198 Other specified symptoms and signs involving the digestive system and abdomen: Secondary | ICD-10-CM

## 2019-06-18 DIAGNOSIS — K529 Noninfective gastroenteritis and colitis, unspecified: Secondary | ICD-10-CM

## 2019-06-18 DIAGNOSIS — Z01818 Encounter for other preprocedural examination: Secondary | ICD-10-CM

## 2019-06-18 MED ORDER — SUPREP BOWEL PREP KIT 17.5-3.13-1.6 GM/177ML PO SOLN
1.00 | Freq: Once | ORAL | 0 refills | Status: AC
Start: 2019-06-18 — End: 2019-06-18

## 2019-06-18 NOTE — Patient Instructions (Signed)
Estanislado Emms, MD  [address]  [place] [address]    Patient Name: Jonathan [name]    The day before the procedure only have clear liquids for breakfast, lunch, and dinner. (Broth, Jello, clear sodas, clear apple juice, white grape juice, white cranberry juice, weak tea NO milk or lemon). You may continue to have clear liquids until 4 hours prior to your procedure NO RED JELLO.     SUPREP Bowel Prep Kit is a split-dose (2-day) regimen. Both 6-ounce bottles are required for a complete prep.    First Dose: Begin Step 1 at 6 pm the evening before your procedure:   Step 1 Pour one (1) 6-ounce bottle of SUPREP liquid into the mixing container.  Step 2 Add cool water to the 16-ounce line on the container and mix.  Step 3 Drink all the liquid in the container.  Step 4 You must drink two (2) more 16-ounce containers of water over the next hour.    Second Dose: Repeat Steps 1-4 using the other 6-ounce bottle at 1 am the morning of your procedure.  Note: You must finish drinking the final glass at least 4 hour before your colonoscopy.    Date:December 2 Time: 7:30.    TAKE THE FOLLOWING MEDICATIONS WITH A SIP OF WATER ON THE MORNING OF THE TEST: losartan    STOP THE FOLLOWING MEDICATIONS THE DAY BEFORE AND THE MORNING OF THE TEST:     STOP THE FOLLOWING MEDICATION 5 DAYS BEFORE THE TEST:  warfarin    Important note: * This examination carries with it the risk of perforation, a serious complication which could require surgery.       PLEASE NOTE:  YOU MUST HAVE SOMEONE DRIVE YOU HOME FROM YOUR PROCEDURE, OTHER THAN A TAXI OR YOUR PROCEDURE WILL BE CANCELLED!        ? [facility]     If your procedure is to be done at [facility]. [facility], please report to the registration desk at the yellow entrance 1 HOUR prior to your scheduled procedure.    [address]  [place], [place] [address]  [phone]

## 2019-06-18 NOTE — H&P (View-Only) (Signed)
Kaliopi Blyden S. Cedarius Kersh, M.D.  [facility]  [address], [address]  [place], [place].  [address]  Phone:[phone]  Fax:[phone]          Date Time: 06/18/2019 1:13 PM   Patient Name: [name],[name]  Referring Physician:         Reason for Consultation:   Diarrhea  Verbal consent has been obtained from the patient to conduct a telephone or video visit to minimize exposure to COVID-19.    History:   Jonathan [name] is a 68 y.o. male who presents to the office for unexplained diarrhea.    Risk factors for colon cancer: age fifty or older    Current symptoms:diarrhea for one month.  His stools were initially loose but are now watery.  He has up to six times a day.  No modifying factors or other associated signs and symptoms.  Stool cultures have been negative.  Vyrlar is a relatively new medicine.    Symptoms specifically denied: Abdominal pain, Bleeding, Constipation, Fever and Nausea/vomiting    Past history of other GI diseases:none    Prior colonoscopy: no    Additional Notes: He states he has lost twenty pounds over the last six weeks.    Past Medical History:     Past Medical History:   Diagnosis Date   . Anxiety    . Atrial fibrillation 7/14 dx    holter7/14 rates 30-111, 05/2014 27-146, Avg 41  no pauses >2.5 sec. Holter 10/15 rates in 30s mostly 7 pm to 7 am, > 100 w exercise only   . Back pain     numbness in feet   . Bilirubinemia    . Bronchiectasis    . Cold hands and feet    . Coronary artery disease 01/2014    mild inf ischemia   . Depression    . Dyspnea on exertion    . History of basal cell cancer    . Hyperlipidemia    . Hypertension    . Migraine headache    . Neck pain     numbness in hands   . Pacemaker    . Sick sinus syndrome     s/p pacemaker placement   . Thoracic aortic ectasia 4.30 January 2013, 4.2 12/2014   . TIA (transient ischemic attack)        Past Surgical History:     Past Surgical History:   Procedure Laterality Date   . CARDIAC CATHETERIZATION  04/2014    50% lad, tight origin  of D1   . CARDIAC PACEMAKER PLACEMENT     . THORACENTESIS Right 03/11/2019    Procedure: THORACENTESIS;  Surgeon: Alikhani, Ali, MD;  Location: [facility];  Service: Interventional Radiology;  Laterality: Right;       Family History:     Family History   Problem Relation Age of Onset   . Hypertension Mother    . Aplastic anemia Mother    . Anemia Mother         aplastic anemia   . Stroke Father    . Diabetes Father    . Heart disease Father         pacemaker,CAD   . COPD Father    . Leukemia Sister         cml   . Depression Sister    . Anxiety disorder Sister    . Obesity Daughter    . Heart attack Paternal Uncle    . Heart attack   Paternal Grandfather    . Depression Sister    . Anxiety disorder Sister    . Sleep apnea Brother    . Cancer Brother         basal cell   . Heart attack Paternal Aunt        Social History:     Social History     Socioeconomic History   . Marital status: Married     Spouse name: Not on file   . Number of children: Not on file   . Years of education: Not on file   . Highest education level: Not on file   Occupational History   . Occupation: retired   Social Needs   . Financial resource strain: Not on file   . Food insecurity     Worry: Not on file     Inability: Not on file   . Transportation needs     Medical: Not on file     Non-medical: Not on file   Tobacco Use   . Smoking status: Former Smoker     Packs/day: 1.00     Years: 10.00     Pack years: 10.00     Quit date: 03/28/1984     Years since quitting: 35.2   . Smokeless tobacco: Never Used   Substance and Sexual Activity   . Alcohol use: Yes     Comment: occasionally   . Drug use: No   . Sexual activity: Yes     Partners: Female     Birth control/protection: Post-menopausal   Lifestyle   . Physical activity     Days per week: Not on file     Minutes per session: Not on file   . Stress: Not on file   Relationships   . Social connections     Talks on phone: Not on file     Gets together: Not on file     Attends religious service: Not on  file     Active member of club or organization: Not on file     Attends meetings of clubs or organizations: Not on file     Relationship status: Not on file   . Intimate partner violence     Fear of current or ex partner: Not on file     Emotionally abused: Not on file     Physically abused: Not on file     Forced sexual activity: Not on file   Other Topics Concern   . Not on file   Social History Narrative   . Not on file       Allergies:   No Known Allergies    Medications:     Current Outpatient Medications:   .  Cariprazine HCl (Vraylar) 4.5 MG Cap, , Disp: , Rfl:   .  losartan (COZAAR) 50 MG tablet, Take 50 mg by mouth daily, Disp: , Rfl:   .  Melatonin 10 MG Cap, Take 10 mg by mouth daily, Disp: , Rfl:   .  sertraline (ZOLOFT) 100 MG tablet, Take 100 mg by mouth daily, Disp: , Rfl:   .  warfarin (COUMADIN) 10 MG tablet, Take 10 mg by mouth daily, Disp: , Rfl:       Review of Systems:   Constitutional:No fever,chills,weight loss or gain.  Integument:No skin rashes or lesions.  Eyes:No changes in vision  ENMT:No changes in hearing,nasal discharge,sore throat,oral lesions.  Respiratory:No shortness of breath or cough.  Cardiovascular:No chest pain or palpitations.    Genitourinary:No nocturia,urinary frequency, or dysuria.  Gastrointestinal:See HPI.  Musculoskeletal:No back or joint pain.  Neurologic:No migraine headaches,numbness, or tingling.  Endocrine:No polydipsia,cold or heat intolerance.  Hematologic:No bleeding or bruising.  Psychiatric:No depression or anxiety.     Physical Exam:     Vitals:    06/18/19 1304   Weight: 86.2 kg (190 lb)   Height: 1.854 m (6' 1")       General appearance - Well developed and well nourished. Normal gait.  No obvious abnormalities on video exam.    Assessment:   Change in bowel habits  Chronic diarrhea  ? Medicine related    Plan:   I have discussed the benefits and risks of a colonoscopy in detail with the patient and/or family including the risk of perforation and informed  consent has been obtained.    If negative, consider stopping zoloft and vraylar.    Will need to hold coumadin.  Patient to check with Dr. Yang.    Time spent in discussion: 30 minutes.    Signed by: Curren Mohrmann Scott Larken Urias, MD    All recent labs and radiologic studies were reviewed at the time of the visit. Thank you for allowing me to participate in the care of your patient.

## 2019-06-18 NOTE — Progress Notes (Signed)
Hassie Bruce, M.D.  [facility]  [address], [address]  [place], [place].  [address]  Phone:[phone]  [phone]          Date Time: 06/18/2019 1:13 PM   Patient Name: Jonathan [name]  Referring Physician:         Reason for Consultation:   Diarrhea  Verbal consent has been obtained from the patient to conduct a telephone or video visit to minimize exposure to COVID-19.    History:   Jonathan [name] is a 68 y.o. male who presents to the office for unexplained diarrhea.    Risk factors for colon cancer: age 37 or older    Current symptoms:diarrhea for one month.  His stools were initially loose but are now watery.  He has up to six times a day.  No modifying factors or other associated signs and symptoms.  Stool cultures have been negative.  Vyrlar is a relatively new medicine.    Symptoms specifically denied: Abdominal pain, Bleeding, Constipation, Fever and Nausea/vomiting    Past history of other GI diseases:none    Prior colonoscopy: no    Additional Notes: He states he has lost twenty pounds over the last six weeks.    Past Medical History:     Past Medical History:   Diagnosis Date   . Anxiety    . Atrial fibrillation 7/14 dx    holter7/14 rates 30-111, 05/2014 27-146, Avg 41  no pauses >2.5 sec. Holter 10/15 rates in 30s mostly 7 pm to 7 am, > 100 w exercise only   . Back pain     numbness in feet   . Bilirubinemia    . Bronchiectasis    . Cold hands and feet    . Coronary artery disease 01/2014    mild inf ischemia   . Depression    . Dyspnea on exertion    . History of basal cell cancer    . Hyperlipidemia    . Hypertension    . Migraine headache    . Neck pain     numbness in hands   . Pacemaker    . Sick sinus syndrome     s/p pacemaker placement   . Thoracic aortic ectasia 4.30 January 2013, 4.2 12/2014   . TIA (transient ischemic attack)        Past Surgical History:     Past Surgical History:   Procedure Laterality Date   . CARDIAC CATHETERIZATION  04/2014    50% lad, tight origin  of D1   . CARDIAC PACEMAKER PLACEMENT     . THORACENTESIS Right 03/11/2019    Procedure: THORACENTESIS;  Surgeon: Denna Haggard, MD;  Location: [facility];  Service: Interventional Radiology;  Laterality: Right;       Family History:     Family History   Problem Relation Age of Onset   . Hypertension Mother    . Aplastic anemia Mother    . Anemia Mother         aplastic anemia   . Stroke Father    . Diabetes Father    . Heart disease Father         pacemaker,CAD   . COPD Father    . Leukemia Sister         cml   . Depression Sister    . Anxiety disorder Sister    . Obesity Daughter    . Heart attack Paternal Uncle    . Heart attack  Paternal Grandfather    . Depression Sister    . Anxiety disorder Sister    . Sleep apnea Brother    . Cancer Brother         basal cell   . Heart attack Paternal Aunt        Social History:     Social History     Socioeconomic History   . Marital status: Married     Spouse name: Not on file   . Number of children: Not on file   . Years of education: Not on file   . Highest education level: Not on file   Occupational History   . Occupation: retired   Engineer, production   . Financial resource strain: Not on file   . Food insecurity     Worry: Not on file     Inability: Not on file   . Transportation needs     Medical: Not on file     Non-medical: Not on file   Tobacco Use   . Smoking status: Former Smoker     Packs/day: 1.00     Years: 10.00     Pack years: 10.00     Quit date: 03/28/1984     Years since quitting: 35.2   . Smokeless tobacco: Never Used   Substance and Sexual Activity   . Alcohol use: Yes     Comment: occasionally   . Drug use: No   . Sexual activity: Yes     Partners: Female     Birth control/protection: Post-menopausal   Lifestyle   . Physical activity     Days per week: Not on file     Minutes per session: Not on file   . Stress: Not on file   Relationships   . Social Wellsite geologist on phone: Not on file     Gets together: Not on file     Attends religious service: Not on  file     Active member of club or organization: Not on file     Attends meetings of clubs or organizations: Not on file     Relationship status: Not on file   . Intimate partner violence     Fear of current or ex partner: Not on file     Emotionally abused: Not on file     Physically abused: Not on file     Forced sexual activity: Not on file   Other Topics Concern   . Not on file   Social History Narrative   . Not on file       Allergies:   No Known Allergies    Medications:     Current Outpatient Medications:   .  Cariprazine HCl (Vraylar) 4.5 MG Cap, , Disp: , Rfl:   .  losartan (COZAAR) 50 MG tablet, Take 50 mg by mouth daily, Disp: , Rfl:   .  Melatonin 10 MG Cap, Take 10 mg by mouth daily, Disp: , Rfl:   .  sertraline (ZOLOFT) 100 MG tablet, Take 100 mg by mouth daily, Disp: , Rfl:   .  warfarin (COUMADIN) 10 MG tablet, Take 10 mg by mouth daily, Disp: , Rfl:       Review of Systems:   Constitutional:No fever,chills,weight loss or gain.  Integument:No skin rashes or lesions.  Eyes:No changes in vision  ENMT:No changes in hearing,nasal discharge,sore throat,oral lesions.  Respiratory:No shortness of breath or cough.  Cardiovascular:No chest pain or palpitations.  Genitourinary:No nocturia,urinary frequency, or dysuria.  Gastrointestinal:See HPI.  Musculoskeletal:No back or joint pain.  Neurologic:No migraine headaches,numbness, or tingling.  Endocrine:No polydipsia,cold or heat intolerance.  Hematologic:No bleeding or bruising.  Psychiatric:No depression or anxiety.     Physical Exam:     Vitals:    06/18/19 1304   Weight: 86.2 kg (190 lb)   Height: 1.854 m (6\' 1" )       General appearance - Well developed and well nourished. Normal gait.  No obvious abnormalities on video exam.    Assessment:   Change in bowel habits  Chronic diarrhea  ? Medicine related    Plan:   I have discussed the benefits and risks of a colonoscopy in detail with the patient and/or family including the risk of perforation and informed  consent has been obtained.    If negative, consider stopping zoloft and vraylar.    Will need to hold coumadin.  Patient to check with Dr. Threasa Beards.    Time spent in discussion: 30 minutes.    Signed by: Estanislado Emms, MD    All recent labs and radiologic studies were reviewed at the time of the visit. Thank you for allowing me to participate in the care of your patient.

## 2019-06-18 NOTE — Psych (Signed)
[name]  MRN: [id number]  Account: [id number]  Session Start: 06/18/2019 11:00:00 AM  Session Stop: 06/18/2019 11:53:00 AM    Total Treatment Minutes: 53.00 Minutes    Psychology Services  Outpatient Rehabilitation Progress Note    Rehab Diagnosis: TBI  Demographics:            Age: 82Y            Gender: Male    Medications and Allergies: Significant rehabilitation considerations:   NKA  Rehabilitation Precautions/Restrictions:   No rehabilitation precautions.    SUBJECTIVE  Patient Reports: Pt reported the following:  Feeling persistent anxiety  Feeling guilty  Feeling self-critical    Suicide Risk Screen: Patient has these primary or secondary behavioral health  diagnoses or complaints: Describes self as mildly depressed. Depression related  to medical situation Denied Si and hI    OBJECTIVE  General Observation: Arrived on time.  Mood was depressed.  No evidence of  psychosis or delusions.  He explicitly denied SI and HI      Interventions:   NPSY INDIVID HLTH INTERV INT   NPSY INDIVID HLTH INTERV ADD    ASSESSMENT    Impressions:  Pt reported the following:  Feeling persistent anxiety  Feeling guilty  Feeling self-critical      PLAN  Continued Psychology services are recommended to address: Continue to follow  patient as needed for emotional support, assistance with coping skills.  Recommendations:  Weekly behavioral intervention    Signed by: Volanda Napoleon, Psy.D. 06/18/2019 11:53:00 AM

## 2019-06-18 NOTE — Telephone Encounter (Signed)
Lvm for pt to call back regarding covid test info. Will send pt mychart message

## 2019-06-19 ENCOUNTER — Encounter (INDEPENDENT_AMBULATORY_CARE_PROVIDER_SITE_OTHER): Payer: Self-pay | Admitting: Gastroenterology

## 2019-06-19 NOTE — Rehab Progress Note ([facility]) (Signed)
Jonathan [name]  MRN: [id number]  Account: [id number]  Session Start: 06/19/2019 10:05:00 AM  Session Stop: 06/19/2019 11:00:00 AM    Total Treatment Minutes: 55.00 Minutes    Occupational Therapy  Outpatient Treatment Note    Medical Diagnosis: Rank Code      Description    Date of Onset    1    S06.2X1   Diffuse traumatic brain injury with loss of      05/15/2019                 consciousness of 30 minutes or less  1    S06.2X1   Diffuse traumatic brain injury with loss of      05/16/2019                 consciousness of 30 minutes or less  Therapy Diagnosis:  Rank Code      Description     Date of Onset    1    R27.8     Other lack of coordination                       05/16/2019  2    R41.844   Frontal lobe and executive function deficit      05/16/2019  3    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  I had tapioca for breakfast so I have something in my stomache.  .  Patient/Caregiver Goals: 1. return to driving    2. improve safety and endurance during exercise    3. improve memory  4. return to cooking  Pain: Patient currently without complaints of pain.    OBJECTIVE  General Observation: . pt received on time in lobby. +CLs and facemask. Distant  S for mobility    Interventions:       Therapeutic Activities: pt completed OT homework and utelized appropriate  compensatory patterns w/o prompting from OT in order to self-correct work. Pt  mildly receptive to positive feedback    Introduced to visual scanning task within visually cluttered worksheet.  Completed at table top level; medium-hard difficulty. Pt completes word search  w/ cues from OT for self pacing. Pt initially rating task as 53/10 frustrating;  however with questioning pt able to utelize  correct scale and rate self as a  true 5/10 frustrated. Pt able to locate 24/36 words in 50 minutes with minimum  assistance. Pt provided w/ worksheed +2 additional worksheets for homework    Pain Reassessment: Pain was not reassessed as no pain was reported.  Education:  Education Provided: homework       Audience: Patient.       Mode: Explanation. Handouts. Demonstration       Response: Verbalized understanding. Needs practice    ASSESSMENT  Pt utelizes efficient search strategies, however frequently demonstrates errors  in scannin when words are located in center of letter array, indicating  difficulties w/ visual discrimination and visual attention. Cont to demonstrate  impaired frustration tolerance and ability to pace activities  to promote  success. Pt extremely self critical    Activity/Participation Problem List and Goals: No updates at this time.  Functions/Structures Problem List and Goals: No updates at this time.    PLAN  Treatment Frequency, Duration, and Interventions: Continue Occupational Therapy  to achieve goals per previously established Plan of Care.  Development of Plan of Care: There was no change to plan of care today. Patient  and/or family continue to be in agreement with plan of care.  The patient has been instructed to contact our clinic if any questions or  problems should arise.    Visit Number: Today's visit is number  11    Signed by: Particia Nearing, OTR/L 06/19/2019 11:00:00 AM

## 2019-06-19 NOTE — Rehab Progress Note ([facility]) (Signed)
Jonathan [name]  MRN: [id number]  Account: [id number]  Session Start: 06/19/2019 11:00:00 AM  Session Stop: 06/19/2019 11:55:00 AM    Total Treatment Minutes: 55.00 Minutes    Physical Therapy  Outpatient Treatment Note    Medical Diagnosis: Rank Code      Description    Date of Onset    1    S06.2X1   Diffuse traumatic brain injury with loss of      05/15/2019                 consciousness of 30 minutes or less  1    S06.2X1   Diffuse traumatic brain injury with loss of      05/16/2019                 consciousness of 30 minutes or less  Therapy Diagnosis:  Rank Code      Description     Date of Onset    1    Z91.81    History of falling                               05/15/2019  2    R27.8     Other lack of coordination                       05/15/2019  3    R26.8     Other abnormalities of gait and mobility         05/15/2019  Demographics:            Age: 40Y            Gender: Male    Rehabilitation Precautions/Restrictions:   no driving  falls  Pacemaker  AFiB on Coumadin    SUBJECTIVE  Patient Report: "I'm still losing my balance when I walk up/down the stairs,  especially if I'm trying to carry something. I almost fell the other day."  Patient/Caregiver Goals: "To get back to exercising on my bike."  "To be able to walk around my neighborhood without losing my balance."  "To not be a burden to my family."  Pain: Patient currently without complaints of pain.    OBJECTIVE  General Observation: Pt received on-time in lobby. Pt ambulated back to gym  without AD, independently, slight shuffling gait pattern noted. Pt dons mask and  glasses for duration of session.  Other/Additional Findings: BP: 128/77  HR: 58  SpO2: 99%    Interventions:       Therapeutic Activities:  .       Neuromuscular Reeducation:  .    TA:  -Re-assessed 6 minute walk test: pt ambulated 1,637 feet without a device, no  LOB noted. Reviewed progress from previous test administered 05/17/19.  -HEP review: Pt reporting compliance once over the  weekend with HEP; continued  difficulty with tandem stance and tandem walking. Able to hold SLS for 10  seconds on each leg. Encouraged community ambulation with wife, 10-15 minutes.      NMRE:  Remainder of session focused on improving dynamic balance and coordination.  -Rockerboard with A/P and R/L weight-shifting: 6 minutes each direction  -Foam balance beam: tandem walk; side-stepping with occasional UE support to  steady self required (30% of the time)  -Fwd walking over orange hurdles  -Sidestepping over orange hurdles      Pain Reassessment: Pain was not reassessed as no  pain was reported.  Education:    Education Provided: Precautions. Plan of care. Home exercise/activity plan.       Audience: Patient.       Mode: Explanation.  Demonstration.       Response: Verbalized understanding.  Needs practice.  Needs reinforcement.    ASSESSMENT  Pt demonstrated a 31% improvement in his 6 minute walk test performance  indicating a clinically significant improvement in endurance. He continues to  become frustrated when he is unable to complete coordination and balance tasks  perfectly. Reviewed HEP with continued recommendation to implement community  ambulation with wife into daily routine. Plan to focus next session on stair  negotiation (without handrails, carrying objects, negotiating 2 sequential steps  to simulate sunken living room).    Activity/Participation Problem List and Goals: No updates at this time.  Functions/Structures Problem List and Goals: No updates at this time.    PLAN  Treatment Frequency, Duration, and Interventions: Continue Physical Therapy to  achieve goals per previously established Plan of Care.  Development of Plan of Care: There was no change to plan of care today. Patient  and/or family continue to be in agreement with plan of care.  The patient has been instructed to contact our clinic if any questions or  problems should arise.    Visit Number: Today's visit is number  11    Signed  by: Bertis Ruddy, PT 06/19/2019 12:00:00 PM

## 2019-06-21 ENCOUNTER — Inpatient Hospital Stay
Admission: RE | Admit: 2019-06-21 | Discharge: 2019-06-21 | Disposition: A | Discharge status: Home / Self Care | Payer: Medicare Other | Source: Ambulatory Visit | Attending: Specialist | Admitting: Specialist

## 2019-06-21 DIAGNOSIS — I482 Chronic atrial fibrillation, unspecified: Secondary | ICD-10-CM | POA: Insufficient documentation

## 2019-06-21 LAB — PT/INR
PT INR: 2.8 — ABNORMAL HIGH (ref 0.9–1.1)
PT: 29.6 s — ABNORMAL HIGH (ref 12.6–15.0)

## 2019-06-21 NOTE — Rehab Progress Note ([facility]) (Signed)
NAMEORLANDUS [name]  MRN: [id number]  Account: [id number]  Session Start: 06/21/2019 11:00:00 AM  Session Stop: 06/21/2019 11:55:00 AM    Total Treatment Minutes: 55.00 Minutes    Physical Therapy  Outpatient Treatment Note    Medical Diagnosis: Rank Code      Description    Date of Onset    1    S06.2X1   Diffuse traumatic brain injury with loss of      05/15/2019                 consciousness of 30 minutes or less  1    S06.2X1   Diffuse traumatic brain injury with loss of      05/16/2019                 consciousness of 30 minutes or less  Therapy Diagnosis:  Rank Code      Description     Date of Onset    1    Z91.81    History of falling                               05/15/2019  2    R27.8     Other lack of coordination                       05/15/2019  3    R26.8     Other abnormalities of gait and mobility         05/15/2019  Demographics:            Age: 37Y            Gender: Male    Rehabilitation Precautions/Restrictions:   no driving  falls  Pacemaker  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    OBJECTIVE  General Observation: Pt received on-time in lobby. Pt ambulated back to gym  without AD, independently, slight shuffling gait pattern noted. Pt dons mask and  glasses for duration of session.  Other/Additional Findings: BP: 146/88  HR: 60  SpO2: 99%    Interventions:       Therapeutic Activities:  .    Focus of session on stair training.    TA:  -step up/down on 8-inch step without UE support: 30x  -lateral step up/down on 8-inch step without UE support: 30x  -step up/over on 8-inch step without UE support: 30x  -step up/through (1 leg on step) without UE support: 30x  Pt completed all 4 activites  above while holding onto a soccer ball to minimize  tendency to reach out to steady self on parallel bar    Stair negotiation:  -set of 4 standard height steps: pt completed 10 reps of carrying soccer ball  up/down stairs with reciprocal pattern; noted occasional difficulty placing foot  on stair when descending as heel would get caught on lip of previous step;  performance improved when cued to "over-shoot" it (toes dangling slightly off  edge of step without LOB)  -set of 16 stairs in lobby: pt negotiated 4 flights of stairs with  single HR and  reciprocal pattern with mod I; pt did not feel comfortable attempting these  stairs without HR due to fear of falling.    Pain Reassessment: Pain was not reassessed as no pain was reported.  Education:    Education Provided: Fall prevention/balance training. Safety.  Stair/curb/environmental barrier negotiation.       Audience: Patient.       Mode: Explanation.  Demonstration.       Response: Verbalized understanding.  Needs practice.  Needs reinforcement.    ASSESSMENT  Pt not comfortable attempting to negotiate a full flight of stairs without HR  this session. Advised pt to avoid carrying laundry up/down stairs until he is  able to negotiate stairs confident without HR. Continued frustration when pt  does not feel his performance is "perfect" and noted to call himself "stupid"  multiple times during session.    Activity/Participation Problem List and Goals: No updates at this time.  Functions/Structures Problem List and Goals: No updates at this time.    PLAN  Treatment Frequency, Duration, and Interventions: Continue Physical Therapy to  achieve goals per previously established Plan of Care.  Development of Plan of Care: There was no change to plan of care today. Patient  and/or family continue to be in agreement with plan of care.  The patient has been instructed to contact our clinic if any questions or  problems should arise.    Visit Number: Today's visit is number   12    Signed by: Bertis Ruddy, PT 06/21/2019 12:00:00 PM

## 2019-06-24 NOTE — Rehab Progress Note ([facility]) (Signed)
Jonathan [name]  MRN: [id number]  Account: [id number]  Session Start: 06/24/2019 10:00:00 AM  Session Stop: 06/24/2019 10:55:00 AM    Total Treatment Minutes: 55.00 Minutes    Physical Therapy  Outpatient Treatment Note    Medical Diagnosis: Rank Code      Description    Date of Onset    1    S06.2X1   Diffuse traumatic brain injury with loss of      05/15/2019                 consciousness of 30 minutes or less  1    S06.2X1   Diffuse traumatic brain injury with loss of      05/16/2019                 consciousness of 30 minutes or less  Therapy Diagnosis:  Rank Code      Description     Date of Onset    1    Z91.81    History of falling                               05/15/2019  2    R27.8     Other lack of coordination                       05/15/2019  3    R26.8     Other abnormalities of gait and mobility         05/15/2019  Demographics:            Age: 66Y            Gender: Male    Rehabilitation Precautions/Restrictions:   no driving  falls  Pacemaker  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 I'm not sure why."  Patient/Caregiver Goals: "To get back to exercising on my bike."  "To be able to walk around my neighborhood without losing my balance."  "To not be a burden to my family."  Pain: Patient currently without complaints of pain.    OBJECTIVE  General Observation: Pt received on-time in lobby. Pt ambulated back to gym  without AD, independently, slight shuffling gait pattern noted. Pt dons mask and  glasses for duration of session.  Other/Additional Findings: BP: 114/69  HR: 60  SpO2: 98%    Interventions:       Therapeutic Activities:  .       Neuromuscular Reeducation:  .    TA:  -15 minutes total on treadmill: 8 minutes of uphill walking (5% incline) with  BUE support; 7 minutes of walking on level surface with BUE support, UUE  support, and no arms support; verbal cuing for increasing step length on LLE,  walking quietly, heel strike on LLE. Speed ranged from 1.2-1.6  mph. All  ambulation completed with close supervision.  -Sit<-->stands on foam pad without UE support from progressively lowered mat  height; 3x10    NMRE:  -Foam: heel raises  -Foam: toe raises (2 losses of balance requiring physical assistance to steady)  -Foam: marching in place  -Braiding with fwd/bkwd cross; 6 laps in hallway      Pain Reassessment: Pain was not reassessed as no pain was reported.  Education:    Education Provided: Precautions. Plan of care. Functional transfers. Gait. Home  exercise/activity plan. Stair/curb/environmental barrier negotiation.  Audience: Patient.       Mode: Explanation.  Demonstration.       Response: Verbalized understanding.  Needs practice.    ASSESSMENT  Pt reports ambulating alone in neighborhood x15 minutes this weekend with one  LOB experienced when turning to look behind him. He also reported compliance  with HEP and negotiating stairs without difficulty this weekend. He experiences  greater loss of balance when weight-shifting posteriorly and requires occasional  physical assistance to regain balance. Significant fatigue noted today with pt  laying flat to rest between exercises.    Activity/Participation Problem List and Goals: No updates at this time.  Functions/Structures Problem List and Goals: No updates at this time.    PLAN  Treatment Frequency, Duration, and Interventions: Continue Physical Therapy to  achieve goals per previously established Plan of Care.  Development of Plan of Care: There was no change to plan of care today. Patient  and/or family continue to be in agreement with plan of care.  The patient has been instructed to contact our clinic if any questions or  problems should arise.    Visit Number: Today's visit is number  13    Signed by: Bertis Ruddy, PT 06/24/2019 11:00:00 AM

## 2019-06-25 ENCOUNTER — Encounter (INDEPENDENT_AMBULATORY_CARE_PROVIDER_SITE_OTHER): Payer: Self-pay

## 2019-06-25 NOTE — Rehab Progress Note ([facility]) (Signed)
Jonathan [name]  MRN: [id number]  Account: [id number]  Session Start: 06/21/2019 10:02:00 AM  Session Stop: 06/21/2019 11:00:00 AM    Total Treatment Minutes: 58.00 Minutes    Occupational Therapy  Outpatient Treatment Note    Medical Diagnosis: Rank Code      Description    Date of Onset    1    S06.2X1   Diffuse traumatic brain injury with loss of      05/15/2019                 consciousness of 30 minutes or less  1    S06.2X1   Diffuse traumatic brain injury with loss of      05/16/2019                 consciousness of 30 minutes or less  Therapy Diagnosis:  Rank Code      Description     Date of Onset    1    R27.8     Other lack of coordination                       05/16/2019  2    R41.844   Frontal lobe and executive function deficit      05/16/2019  3    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 I have some questions and I want  to see what strategies you use  .  Patient/Caregiver Goals: 1. return to driving    2. improve safety and endurance during exercise    3. improve memory  4. return to cooking  Pain: Patient currently without complaints of pain.    OBJECTIVE  General Observation: . pt received on time in lobby. +CLs and facemask.  Independent for mobility w/o AD    Interventions:       Therapeutic Activities: per pt request, review of homework. Pt had located  aproximately 23 of 100 words, reporting no use of set strategies for visual  scanning. OT instructs on various search strategies to improve rate and accuracy  of scanning. OT and pt complete task in tandom, w/ OT modeling strategies during  functional scanning to improve understanding. Pt verbalizes understanding. OT  initiates 1 rest break and reinforces edu on s/s visual and cog fatigue which  impact  accuracy. Pt verbalizing understanding though hx poor carryover. Pt to  cont to complete for homework    Pain Reassessment: Pain was not reassessed as no pain was reported.  Education:  Education Provided: homework. visual scanning       Audience: Patient.       Mode: Explanation. Handouts. Demonstration       Response: Verbalized understanding. Needs practice    ASSESSMENT  Pt cont to view all feedback, including postive feedback, negatively. Mood cont  to serve as barrier to goals and will cont to benefit from NPSY intervention. Pt  w/ greatest difficulty detecting words/letters in center of word search,  indicating deficits in visual scanning. Deficits impact his ability to  participate in leisure activity of  reading and review of personal medical  documents, which increases burden of care and causes disengagement at present    Activity/Participation Problem List and Goals: No updates at this time.  Functions/Structures Problem List and Goals: No updates at this time.    PLAN  Treatment Frequency, Duration, and Interventions: Continue Occupational Therapy  to achieve goals per previously established Plan of Care.  Development of Plan of Care: There was no change to plan of care today. Patient  and/or family continue to be in agreement with plan of care.  The patient has been instructed to contact our clinic if any questions or  problems should arise.    Visit Number: Today's visit is number  12    Signed by: Particia Nearing, OTR/L 06/21/2019 11:00:00 AM

## 2019-06-25 NOTE — Psych (Signed)
NAMEJAKYRIN BEMAN  MRN: [id number]  Account: [id number]  Session Start: 06/25/2019 9:00:00 AM  Session Stop: 06/25/2019 9:53:00 AM    Total Treatment Minutes: 53.00 Minutes    Psychology Services  Outpatient Rehabilitation Progress Note    Rehab Diagnosis: TBI  Demographics:            Age: 19Y            Gender: Male    Medications and Allergies: Significant rehabilitation considerations:   NKA  Rehabilitation Precautions/Restrictions:   No rehabilitation precautions.    SUBJECTIVE  Patient Reports: Pt reported the following:  Feeling depressed  Feeling discouraged by a lack of perceived progress  Feeling tired  Feeling a loss of control    Suicide Risk Screen: Patient has these primary or secondary behavioral health  diagnoses or complaints: Describes self as mildly depressed. 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  Mood was depressed.  No evidence of  psychosis or delusions.  He explicitly denied SI and HI      Interventions:   NPSY INDIVID HLTH INTERV INT   NPSY INDIVID HLTH INTERV ADD    ASSESSMENT    Impressions:  Pt reported the following:  Feeling depressed  Feeling discouraged by a lack of perceived progress  Feeling tired  Feeling a loss of control      PLAN  Continued Psychology services are recommended to address: Continue to follow  patient as needed for emotional support, assistance with coping skills.  Recommendations:  [facility] behavioral intervention    Signed by: Volanda Napoleon, Psy.D. 06/25/2019 9:53:00 AM

## 2019-06-26 NOTE — Rehab Progress Note ([facility]) (Signed)
Jonathan [name]  MRN: [id number]  Account: [id number]  Session Start: 06/26/2019 10:00:00 AM  Session Stop: 06/26/2019 10:57:00 AM    Total Treatment Minutes: 57.00 Minutes    Occupational Therapy  Outpatient Treatment Note    Medical Diagnosis: Rank Code      Description    Date of Onset    1    S06.2X1   Diffuse traumatic brain injury with loss of      05/15/2019                 consciousness of 30 minutes or less  1    S06.2X1   Diffuse traumatic brain injury with loss of      05/16/2019                 consciousness of 30 minutes or less  Therapy Diagnosis:  Rank Code      Description     Date of Onset    1    R27.8     Other lack of coordination                       05/16/2019  2    R41.844   Frontal lobe and executive function deficit      05/16/2019  3    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  .  Patient/Caregiver Goals: 1. return to driving    2. improve safety and endurance during exercise    3. improve memory  4. return to cooking  Pain: Patient currently without complaints of pain.    OBJECTIVE  General Observation: . pt received on time in lobby. +CLs and facemask.  Independent for mobility w/o AD    Pt reports he is 50% from his normal functioning    Interventions:       Self Care/Home Management:  Pt engaged in complex word search to address his visual scanning in prep for  community navigation, found 2 words in 15 minutes. Pt very frustrated by  activity. He required cueing to locate the 2 words and was impulsive when  attempting to search for the specific letters. Educated pt to slow down when  searching. His frustration likely hindered this  activity.  Pt then engaged in memory activity using the BITS. He was able to consistently  recall 6 words at a time with visual presentation for 4 seconds, he demo 25%  accuracy recalling a field of 7 or greater.  Pt completed typing test 5 minutes, demo 25 WPM.  Trial 2: 23, patient slowed down as he was correcting grammar errors while  typing    Pain Reassessment: Pain was not reassessed as no pain was reported.    Education:  Education Provided: homework. visual scanning, discuss driving/seizure like  activity with neurologist       Audience: Patient.       Mode: Explanation. Handouts. Demonstration       Response: Verbalized understanding.  Needs practice    ASSESSMENT  Patient appears very frustrated with his life/current recovery. Attempted to  show patient that he is functioning well, although he had excuses for most  examples. Based on his performance today, patient's slight impulsivity and  frustration is impacting his performance in visual scanning tasks. Plan to  address his cooking skills during upcoming treatment session as cooking is a  goal of his.    Activity/Participation Problem List and Goals: No updates at this time.  Functions/Structures Problem List and Goals: No updates at this time.    PLAN  Treatment Frequency, Duration, and Interventions: Continue Occupational Therapy  to achieve goals per previously established Plan of Care.  Development of Plan of Care: There was no change to plan of care today. Patient  and/or family continue to be in agreement with plan of care.  The patient has been instructed to contact our clinic if any questions or  problems should arise.    Visit Number: Today's visit is number  14    Signed by: Rosine Door, OTR/L, CDRS 06/26/2019 11:00:00 AM

## 2019-06-26 NOTE — Rehab Progress Note ([facility]) (Signed)
Jonathan [name]  MRN: [id number]  Account: [id number]  Session Start: 06/24/2019 9:00:00 AM  Session Stop: 06/24/2019 9:54:00 AM    Total Treatment Minutes: 54.00 Minutes    Occupational Therapy  Outpatient Treatment Note    Medical Diagnosis: Rank Code      Description    Date of Onset    1    S06.2X1   Diffuse traumatic brain injury with loss of      05/15/2019                 consciousness of 30 minutes or less  1    S06.2X1   Diffuse traumatic brain injury with loss of      05/16/2019                 consciousness of 30 minutes or less  Therapy Diagnosis:  Rank Code      Description     Date of Onset    1    R27.8     Other lack of coordination                       05/16/2019  2    R41.844   Frontal lobe and executive function deficit      05/16/2019  3    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  Patient/Caregiver Goals: 1. return to driving    2. improve safety and endurance during exercise    3. improve memory  4. return to cooking  Pain: Patient currently without complaints of pain.    OBJECTIVE  General Observation: . pt received on time in lobby. +CLs and facemask.  Independent for mobility w/o AD    Interventions:       Therapeutic Activities: pt instructed to complete familiar task Internet  Savenger Hunt though with novel questins. Provided w/ computer w/ good contrast.  Pt utelizes efficient strategy of copy and pasting Q/T/A without prompting. Able  to complete 14 questions in 25 minutes at a mod I level w/ 100% accuracy.    pt initiates bathrom and requires supervision to wayfind in an unfamiliar  environment. Pt reports upcoming GI appointment which may assist w/ GI    ongoing review of wordsearch homework. Pt had been unable to find any words in  search since  prior tx dispite trying. Review search strategies and  frustration/cog pacing strategies. Pt able to locate 2 words in 10 min following  review of strategies. OT provides mod/max A to locate x4 additional words.    Pain Reassessment: Pain was not reassessed as no pain was reported.  Education:  Education Provided: homework. visual scanning       Audience: Patient.       Mode: Explanation. Handouts. Demonstration       Response: Verbalized understanding. Needs practice    ASSESSMENT  STG: (10 visits from 06/14/19): Pt will complete internet scavenger hunt with  100% accuracy in order to demonstrate improving ability to navigate the web and  opperate computer as needed to communicate electronically with loved  ones and  medical providers. GOAL MET.  Marland Kitchen  pt initiates good strategies for internet scavenger hunt without cues.  pt demos ongoign difficulty with visual discrimination in cluttered and visually  distracting environments. It should be noted he demonstrated excellent  communication strategies for describing where words were located in relation to  eachother  largest barrier to goals and return to roles/occupations remains mood and GI, as  true cognitive deficits are exacerbated by these. Pending SLP referral      Activity/Participation Problem List and Goals: No updates at this time.  Functions/Structures Problem List and Goals: No updates at this time.    PLAN  Treatment Frequency, Duration, and Interventions: Continue Occupational Therapy  to achieve goals per previously established Plan of Care.  Development of Plan of Care: There was no change to plan of care today. Patient  and/or family continue to be in agreement with plan of care.  The patient has been instructed to contact our clinic if any questions or  problems should arise.    Visit Number: Today's visit is number  13    Signed by: Particia Nearing, OTR/L 06/24/2019 10:00:00 AM

## 2019-06-26 NOTE — Rehab Progress Note ([facility]) (Signed)
[name]  MRN: [id number]  Account: [id number]  Session Start: 06/26/2019 11:00:00 AM  Session Stop: 06/26/2019 11:55:00 AM    Total Treatment Minutes: 55.00 Minutes    Physical Therapy  Outpatient Treatment Note    Medical Diagnosis: Rank Code      Description    Date of Onset    1    S06.2X1   Diffuse traumatic brain injury with loss of      05/15/2019                 consciousness of 30 minutes or less  1    S06.2X1   Diffuse traumatic brain injury with loss of      05/16/2019                 consciousness of 30 minutes or less  Therapy Diagnosis:  Rank Code      Description     Date of Onset    1    Z91.81    History of falling                               05/15/2019  2    R27.8     Other lack of coordination                       05/15/2019  3    R26.8     Other abnormalities of gait and mobility         05/15/2019  Demographics:            Age: 3Y            Gender: Male    Rehabilitation Precautions/Restrictions:   no driving  falls  Afib on Coumadin  Pacemaker    SUBJECTIVE  Patient Report: "I think I need to get a cane. I feel much more steady with it."    Patient/Caregiver Goals: "To get back to exercising on my bike."  "To be able to walk around my neighborhood without losing my balance."  "To not be a burden to my family."  Pain: Patient currently without complaints of pain.    OBJECTIVE  General Observation: Pt received on-time in lobby. Pt ambulated back to gym  without AD, independently, slight shuffling gait pattern noted. Pt dons mask and  glasses for duration of session.  Other/Additional Findings: BP: 127/84  HR: 61  SpO2: 99%    Interventions:       Therapeutic Exercise:  .       Therapeutic Activities:  .    TE:  -Upright bike x10 minutes at resistance level 5 to improve LE endurance and  activity tolerance; slight SOB noted while biking, talking, and wearing mask  noted    TA:  -Obstacle course consisting of cone weaving, making 180 degree turns, stepping  over orange hurdles;  repeated for 5 minutes; initially noted L foot shuffling  when turning to the L; significant improvement when provided with 1 verbal cue  to attend to L leg and take larger steps.  -Pt requesting to trial [facility] as he reports to feeling unsteady when ambulating at  home. He ambulated x500 feet with SPC in R hand with proper form and use of AD  noted. Discussed that this therapist does not believe pt needs SPC (he only  needs to focus on his left leg when walking) though pt continues to report  increased confidence with SPC and may purchase.  -Long discussion regarding plan of care and goals. Discussed discharging from  outpatient PT in 6 more visits. Pt requesting additional visits.    Pain Reassessment: Pain was not reassessed as no pain was reported.  Education:    Education Provided: Precautions. Plan of care. Functional transfers. Safety.  Gait. Equipment.       Audience: Patient.       Mode: Explanation.  Demonstration.       Response: Verbalized understanding.  Needs practice.  Needs reinforcement.    ASSESSMENT  When pt can attend to LLE with ambulation, he is able to limit staggering and  negotiate turns and obstacles without difficulty. Pt reporting frustration with  the fact that he has to think about this when walking and that it is not more  automatic. Pt reports greater confidence when ambulating with SPC. Plan to have  colonoscopy done on 07/02/19 as pt has had chronic diarrhea and was not able to  eat prior to this mornings therapy session. Poor appetite and overall fatigue  continue to negatively impact compliance with HEP outside of therapy. ]    Activity/Participation Problem List and Goals: No updates at this time.  Functions/Structures Problem List and Goals: No updates at this time.    PLAN  Treatment Frequency, Duration, and Interventions: Continue Physical Therapy to  achieve goals per previously established Plan of Care.  Development of Plan of Care: There was no change to plan of care today.  Patient  and/or family continue to be in agreement with plan of care.  The patient has been instructed to contact our clinic if any questions or  problems should arise.    Visit Number: Today's visit is number  14    Signed by: Bertis Ruddy, PT 06/26/2019 12:00:00 PM

## 2019-06-30 ENCOUNTER — Ambulatory Visit (INDEPENDENT_AMBULATORY_CARE_PROVIDER_SITE_OTHER): Payer: Medicare Other

## 2019-06-30 DIAGNOSIS — Z01818 Encounter for other preprocedural examination: Secondary | ICD-10-CM

## 2019-06-30 DIAGNOSIS — Z1159 Encounter for screening for other viral diseases: Secondary | ICD-10-CM

## 2019-07-01 ENCOUNTER — Telehealth: Payer: Self-pay

## 2019-07-01 ENCOUNTER — Encounter: Payer: Self-pay | Admitting: Gastroenterology

## 2019-07-01 LAB — COVID-19 (SARS-COV-2)(SOFT): SARS CoV 2 Overall Result: NOT DETECTED

## 2019-07-01 NOTE — Pre-Procedure Instructions (Signed)
PT/INR DOS    Dr. Lujean Amel [phone] : pacer sheet

## 2019-07-02 ENCOUNTER — Ambulatory Visit: Payer: Medicare Other | Attending: Specialist

## 2019-07-02 DIAGNOSIS — R2689 Other abnormalities of gait and mobility: Secondary | ICD-10-CM | POA: Insufficient documentation

## 2019-07-02 DIAGNOSIS — R411 Anterograde amnesia: Secondary | ICD-10-CM | POA: Insufficient documentation

## 2019-07-02 DIAGNOSIS — F6089 Other specific personality disorders: Secondary | ICD-10-CM | POA: Insufficient documentation

## 2019-07-02 DIAGNOSIS — R278 Other lack of coordination: Secondary | ICD-10-CM | POA: Insufficient documentation

## 2019-07-02 DIAGNOSIS — R41841 Cognitive communication deficit: Secondary | ICD-10-CM | POA: Insufficient documentation

## 2019-07-02 DIAGNOSIS — H5330 Unspecified disorder of binocular vision: Secondary | ICD-10-CM | POA: Insufficient documentation

## 2019-07-02 DIAGNOSIS — Z9181 History of falling: Secondary | ICD-10-CM | POA: Insufficient documentation

## 2019-07-02 DIAGNOSIS — R41840 Attention and concentration deficit: Secondary | ICD-10-CM | POA: Insufficient documentation

## 2019-07-02 DIAGNOSIS — R41844 Frontal lobe and executive function deficit: Secondary | ICD-10-CM | POA: Insufficient documentation

## 2019-07-02 DIAGNOSIS — F419 Anxiety disorder, unspecified: Secondary | ICD-10-CM | POA: Insufficient documentation

## 2019-07-02 NOTE — Rehab Progress Note ([facility]) (Signed)
Jonathan [name]  MRN: [id number]  Account: [id number]  Session Start: 07/02/2019 11:00:00 AM  Session Stop: 07/02/2019 11:54:00 AM    Total Treatment Minutes: 54.00 Minutes    Physical Therapy  Outpatient Treatment Note    Medical Diagnosis: Rank Code      Description    Date of Onset    1    S06.2X1   Diffuse traumatic brain injury with loss of      05/15/2019                 consciousness of 30 minutes or less  1    S06.2X1   Diffuse traumatic brain injury with loss of      05/16/2019                 consciousness of 30 minutes or less  Therapy Diagnosis:  Rank Code      Description     Date of Onset    1    Z91.81    History of falling                               05/15/2019  2    R27.8     Other lack of coordination                       05/15/2019  3    R26.8     Other abnormalities of gait and mobility         05/15/2019  Demographics:            Age: 19Y            Gender: Male    Rehabilitation Precautions/Restrictions:   no driving  falls  Afib on Coumadin  Pacemaker    SUBJECTIVE  Patient Report: "I've fallen three times."  With discussion, pt reports all three falls occurred when bending down to pick  something up.  Patient/Caregiver Goals: "To get back to exercising on my bike."  "To be able to walk around my neighborhood without losing my balance."  "To not be a burden to my family."  Pain: Patient currently without complaints of pain.    OBJECTIVE  General Observation: Pt received on-time in lobby. Pt ambulated back to gym  without AD, independently, slight shuffling gait pattern noted. Pt dons mask and  glasses for duration of session.  Other/Additional Findings: N/A    Interventions:       Therapeutic Exercise:  .       Therapeutic Activities:  .       Neuromuscular Reeducation:  .  Session focused on optimizing dynamic balance and retraining for use of  ankle/hip strategy to correct for sway vs over-reliance on UEs:    TE:  -Upright bike x10 minutes with pt controlling resistance levels for  interval  training to improve LE endurance and activity tolerance; slight SOB noted while  biking, talking, and wearing mask noted. Seated rest break required follwoing.    TA:  -Part practice stair training with one foot on 1st step, contralateral LE moving  from floor <>2nd step. Massed practice without UE support with close SPV.  -Stair training with reciprocal pattern, unilateral UE support with cues for  hand placement to optimize stability.    NMR:  Dynamic gait training:  -Alternating toe taps to 6'' stairs with cues to refrain from utilizing UE  support for training of  ankle/hip strategies.  -Narrow base of support  -Retroambulation  -Toe walking/heel walking with light UE support  -Marching  -Walking with focus on turns    Pain Reassessment: Pain was not reassessed as no pain was reported.  Education:    Education Provided: Engineer, production.       Audience: Patient.       Mode: Explanation.  Demonstration.       Response: Applied knowledge.  Verbalized understanding.  Demonstrated skill.    ASSESSMENT  Pt with improved ability to negotiate stairs fluidly with cues to maintain UE  support in front of him vs behind him. Encouraged pt to attempt balance  corrections with ankles/hips when he has sway vs immediately supporting himself  with UEs. Pt will benefit from ongoing training regarding picking things up off  of floor as this has been the source of all of his falls.    Activity/Participation Problem List and Goals: No updates at this time.  Functions/Structures Problem List and Goals: No updates at this time.    PLAN  Treatment Frequency, Duration, and Interventions: Continue Physical Therapy to  achieve goals per previously established Plan of Care.  Development of Plan of Care: There was no change to plan of care today. Patient  and/or family continue to be in agreement with plan of care.  The patient has been instructed to contact our clinic if any questions or  problems  should arise.    Visit Number: Today's visit is number  15    Signed by: Brent General, PT, DPT 07/02/2019 12:00:00 PM

## 2019-07-02 NOTE — Psych (Signed)
NAMEURA SLAIGHT  MRN: [id number]  Account: [id number]  Session Start: 07/02/2019 8:00:00 AM  Session Stop: 07/02/2019 8:53:00 AM    Total Treatment Minutes: 53.00 Minutes    Psychology Services  Outpatient Rehabilitation Progress Note    Rehab Diagnosis: TBI  Demographics:            Age: 75Y            Gender: Male    Medications and Allergies: Significant rehabilitation considerations:   NKA  Rehabilitation Precautions/Restrictions:   No rehabilitation precautions.    SUBJECTIVE  Patient Reports: Pt reported the following:  Persistent anxiety  Relying on control to prevent feared disasters  Catastrophic thinking    Suicide Risk Screen: Patient has these primary or secondary behavioral health  diagnoses or complaints: Describes self as mildly depressed. 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  Mood was depressed.  No evidence of  psychosis or delusions.  He explicitly denied SI and HI      Interventions:   NPSY INDIVID HLTH INTERV INT   NPSY INDIVID HLTH INTERV ADD    ASSESSMENT    Impressions:  Pt reported the following:  Persistent anxiety  Relying on control to prevent feared disasters  Catastrophic thinking      PLAN  Continued Psychology services are recommended to address: Continue to follow  patient as needed for emotional support, assistance with coping skills.  Recommendations:  Weekly behavioral intervention    Signed by: Volanda Napoleon, Psy.D. 07/02/2019 8:53:00 AM

## 2019-07-02 NOTE — Rehab Progress Note ([facility]) (Signed)
Jonathan [name]  MRN: [id number]  Account: [id number]  Session Start: 07/02/2019 10:00:00 AM  Session Stop: 07/02/2019 10:57:00 AM    Total Treatment Minutes: 57.00 Minutes    Occupational Therapy  Outpatient Treatment Note    Medical Diagnosis: Rank Code      Description    Date of Onset    1    S06.2X1   Diffuse traumatic brain injury with loss of      05/15/2019                 consciousness of 30 minutes or less  1    S06.2X1   Diffuse traumatic brain injury with loss of      05/16/2019                 consciousness of 30 minutes or less  Therapy Diagnosis:  Rank Code      Description     Date of Onset    1    R27.8     Other lack of coordination                       05/16/2019  2    R41.844   Frontal lobe and executive function deficit      05/16/2019  3    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  Patient/Caregiver Goals: 1. return to driving    2. improve safety and endurance during exercise    3. improve memory  4. return to cooking    Pain: Pt reports back pain, although refused to rate it stating, it's just  everyday pain    OBJECTIVE  General Observation: . pt received on time in lobby. +CLs and facemask.  Independent for mobility w/o AD      Interventions:       Self Care/Home Management:     Patient engaged in complex scavenger hunt requiring time management, visual  scanning, organization, sequencing, planning, and problem solving. He completed  this activity with distant supervision, requiring 1 verbal cue to read all  aspects of activity due to impulsivity. Pt was able to cook an egg during this  scavenger hunt at mod I.  Pt then engaged in typing test to address his goal of improving his typing  speed. He completed 3, 5 minute tests per his  request.  Trial 1: 21 WPM with 4 errors  Trial 2: 23 WPM with 2 errors  Trial 3: 31 WPM with 11 errors      Pain Reassessment: Pain was not reassessed as no pain was reported.    Education:  Education Provided: homework. visual scanning, discuss driving/seizure like  activity with neurologist       Audience: Patient.       Mode: Explanation. Handouts. Demonstration       Response: Verbalized understanding. Needs practice    ASSESSMENT  Pt continues to demonstrate a depressed mood during treatment requiring  encouragement to attempt new activities during the session. Patient has poor  insight into the progress he  has made since his injury. Pt completed simple  cooking task today at mod I level and he reports cooking the [place] on  Thanksgiving last week. Pt is making good progress towards his goals and will  benefit from further visual scanning tasks during upcoming treatment.    Activity/Participation Problem List and Goals: No updates at this time.  Functions/Structures Problem List and Goals: No updates at this time.    PLAN  Treatment Frequency, Duration, and Interventions: Continue Occupational Therapy  to achieve goals per previously established Plan of Care.  Development of Plan of Care: There was no change to plan of care today. Patient  and/or family continue to be in agreement with plan of care.  The patient has been instructed to contact our clinic if any questions or  problems should arise.    Visit Number: Today's visit is number  15    Signed by: Rosine Door, OTR/L, CDRS 07/02/2019 11:00:00 AM

## 2019-07-03 ENCOUNTER — Ambulatory Visit
Admission: RE | Admit: 2019-07-03 | Discharge: 2019-07-03 | Disposition: A | Discharge status: Home / Self Care | Payer: Medicare Other | Source: Ambulatory Visit | Attending: Gastroenterology | Admitting: Gastroenterology

## 2019-07-03 ENCOUNTER — Encounter
Admission: RE | Disposition: A | Discharge status: Home / Self Care | Payer: Self-pay | Source: Ambulatory Visit | Attending: Gastroenterology

## 2019-07-03 ENCOUNTER — Ambulatory Visit: Payer: Medicare Other | Admitting: Registered Nurse

## 2019-07-03 ENCOUNTER — Ambulatory Visit: Payer: Self-pay

## 2019-07-03 DIAGNOSIS — Z87891 Personal history of nicotine dependence: Secondary | ICD-10-CM | POA: Insufficient documentation

## 2019-07-03 DIAGNOSIS — I2510 Atherosclerotic heart disease of native coronary artery without angina pectoris: Secondary | ICD-10-CM | POA: Insufficient documentation

## 2019-07-03 DIAGNOSIS — I4891 Unspecified atrial fibrillation: Secondary | ICD-10-CM | POA: Insufficient documentation

## 2019-07-03 DIAGNOSIS — K529 Noninfective gastroenteritis and colitis, unspecified: Secondary | ICD-10-CM | POA: Insufficient documentation

## 2019-07-03 DIAGNOSIS — R197 Diarrhea, unspecified: Secondary | ICD-10-CM

## 2019-07-03 DIAGNOSIS — D123 Benign neoplasm of transverse colon: Secondary | ICD-10-CM | POA: Insufficient documentation

## 2019-07-03 DIAGNOSIS — Z8673 Personal history of transient ischemic attack (TIA), and cerebral infarction without residual deficits: Secondary | ICD-10-CM | POA: Insufficient documentation

## 2019-07-03 DIAGNOSIS — I495 Sick sinus syndrome: Secondary | ICD-10-CM | POA: Insufficient documentation

## 2019-07-03 DIAGNOSIS — Z8249 Family history of ischemic heart disease and other diseases of the circulatory system: Secondary | ICD-10-CM | POA: Insufficient documentation

## 2019-07-03 DIAGNOSIS — Z950 Presence of cardiac pacemaker: Secondary | ICD-10-CM | POA: Insufficient documentation

## 2019-07-03 DIAGNOSIS — E785 Hyperlipidemia, unspecified: Secondary | ICD-10-CM | POA: Insufficient documentation

## 2019-07-03 DIAGNOSIS — D125 Benign neoplasm of sigmoid colon: Secondary | ICD-10-CM

## 2019-07-03 DIAGNOSIS — Z7901 Long term (current) use of anticoagulants: Secondary | ICD-10-CM | POA: Insufficient documentation

## 2019-07-03 DIAGNOSIS — R198 Other specified symptoms and signs involving the digestive system and abdomen: Secondary | ICD-10-CM

## 2019-07-03 DIAGNOSIS — I10 Essential (primary) hypertension: Secondary | ICD-10-CM | POA: Insufficient documentation

## 2019-07-03 HISTORY — DX: Cardiac arrhythmia, unspecified: I49.9

## 2019-07-03 HISTORY — DX: Calculus of kidney: N20.0

## 2019-07-03 HISTORY — PX: COLONOSCOPY, BIOPSY: SHX3468

## 2019-07-03 LAB — PT/INR
PT INR: 1.1 (ref 0.9–1.1)
PT: 13.8 s (ref 12.6–15.0)

## 2019-07-03 SURGERY — COLONOSCOPY, WITH BIOPSY
Anesthesia: Anesthesia General | Site: Anus | Wound class: Clean Contaminated

## 2019-07-03 MED ORDER — LIDOCAINE HCL 2 % IJ SOLN
INTRAMUSCULAR | Status: DC | PRN
Start: 2019-07-03 — End: 2019-07-03
  Administered 2019-07-03: 60 mg

## 2019-07-03 MED ORDER — PROPOFOL 10 MG/ML IV EMUL (WRAP)
INTRAVENOUS | Status: AC
Start: 2019-07-03 — End: ?
  Filled 2019-07-03: qty 50

## 2019-07-03 MED ORDER — PROPOFOL 10 MG/ML IV EMUL (WRAP)
INTRAVENOUS | Status: DC | PRN
Start: 2019-07-03 — End: 2019-07-03
  Administered 2019-07-03 (×2): 10 mg via INTRAVENOUS
  Administered 2019-07-03: 40 mg via INTRAVENOUS
  Administered 2019-07-03 (×2): 20 mg via INTRAVENOUS
  Administered 2019-07-03 (×2): 10 mg via INTRAVENOUS
  Administered 2019-07-03 (×2): 20 mg via INTRAVENOUS
  Administered 2019-07-03: 10 mg via INTRAVENOUS
  Administered 2019-07-03 (×2): 20 mg via INTRAVENOUS

## 2019-07-03 MED ORDER — LACTATED RINGERS IV SOLN
INTRAVENOUS | Status: DC
Start: 2019-07-03 — End: 2019-07-03

## 2019-07-03 MED ORDER — SIMETHICONE 40 MG/0.6ML PO SUSP
ORAL | Status: AC
Start: 2019-07-03 — End: ?
  Filled 2019-07-03: qty 30

## 2019-07-03 MED ORDER — EPHEDRINE SULFATE 50 MG/ML IJ/IV SOLN (WRAP)
Status: DC | PRN
Start: 2019-07-03 — End: 2019-07-03
  Administered 2019-07-03: 5 mg via INTRAVENOUS

## 2019-07-03 SURGICAL SUPPLY — 82 items
CATHETER ELHMST HMGLD GLDPRB 7FR 300CM (Procedure Accessories)
CATHETER OD7 FR L300 CM BIPOLAR ROUND (Procedure Accessories) IMPLANT
CATHETER OD7 FR L300 CM BIPOLAR ROUND DISTAL TIP STANDARD CONNECTOR (Procedure Accessories) IMPLANT
ELECTRODE ELECTROSURGICAL 168 SQ CM L170 (Procedure Accessories) IMPLANT
ELECTRODE ELECTROSURGICAL 168 SQ CM L170 MM NESSY HYDROGEL SPLIT (Procedure Accessories) IMPLANT
ELECTRODE ESURG DDRGL 168SQ CM NESSY 170 (Procedure Accessories)
FORCEP HOT BIOPSY RJ4 (Endoscopic Supplies)
FORCEPS BIOPSY L240 CM +2.8 MM HOT OD2.2 (Endoscopic Supplies) IMPLANT
FORCEPS BIOPSY L240 CM +2.8 MM HOT OD2.2 MM RADIAL JAW (Endoscopic Supplies) IMPLANT
FORCEPS BIOPSY L240 CM JUMBO MICROMESH (Instrument) IMPLANT
FORCEPS BIOPSY L240 CM JUMBO MICROMESH TEETH STREAMLINE CATHETER (Instrument) IMPLANT
FORCEPS BIOPSY L240 CM LARGE CAPACITY (Instrument) ×2 IMPLANT
FORCEPS BIOPSY L240 CM MICROMESH TEETH STREAMLINE CATHETER NEEDLE (Instrument) ×1 IMPLANT
FORCEPS BX +2.8MM RJ 4 2.2MM 240CM HOT (Endoscopic Supplies)
FORCEPS BX SS JMB RJ 4 2.8MM 240CM STRL (Instrument)
FORCEPS BX SS LG CPC RJ 4 2.4MM 240CM (Instrument) ×3
FORCEPS JAW RADIAL JUMBO (Instrument)
FORCEPS RAD JAW 4 BIOSPY W/NDL (Instrument) ×1
GAUZE SPONGE 4X4 NS (Dressing) ×1
GOWN CHEMOTHERAPY L47 IN X W28 IN UNIVERSAL OPEN BACK OVERHEAD KNIT (Gown) ×4 IMPLANT
GOWN CHMO PP PE UNV 47X28IN LF OPN BCK (Gown) ×6 IMPLANT
GOWN IMPERVIOUS ONE-SIZE4-ALL (Gown) ×2
JELLY LUB EZ LF STRL H2O SOL NGRS TRNLU (Irrigation Solutions) ×3 IMPLANT
JELLY LUBE STRL FLPTOP 4OZ (Irrigation Solutions) ×1
KIT ENDO W/ FOUR PACK BUTTONS (Kits) ×4
KIT ENDOSCOPIC COMPLIANCE ENDOKIT (Kits) ×2 IMPLANT
KIT ENDOSCOPIC COMPLIANCE ENDOKIT ORCAPOD 4 1.1 OZ (Kits) ×2 IMPLANT
KIT IRR AQUASHIELD ENDOGATOR 230CM TUBE (Kits) ×3 IMPLANT
KIT IRRIGATION CO2 LONG (Kits) ×1
LIGATOR DEVICE ENDOSCOPIC (Disposable Instruments) IMPLANT
LIGATOR ESCP LF STRL DEV DISP (Disposable Instruments)
LIGATOR POLYLOOP DEVICE ENDOSCOPIC (Disposable Instruments) IMPLANT
LOOP LIGATING DISPOSABLE (Disposable Instruments)
MARKER ENDOSCOPIC PERMANENT INDICATION (Syringes, Needles) IMPLANT
MARKER ENDOSCOPIC PERMANENT INDICATION DARK SYRINGE SPOT EX 5 ML (Syringes, Needles) IMPLANT
MARKER ESCP 5ML SPOT EX PERM INDCT DRK (Syringes, Needles)
MASK FACE FM FLDSHLD LF LVL 3 TIE EYSHLD (Personal Protection) IMPLANT
MASK FLUIDSHIELD NON-FOG SURG (Personal Protection)
NEEDLE CARR-LOCKE INJECT 25GX5 (Needles)
NEEDLE SCLEROTHERAPY CARR-LOCKE OD25 GA ODSEC2.5 MM L230 CM INJECTION (Needles) IMPLANT
NEEDLE SCLEROTHERAPY OD25 GA ODSEC2.5 MM (Needles) IMPLANT
NEEDLE SCLRTX SS TFLN CRLK 25GA 2.5MM (Needles)
NET ROTH FOREIGN BODY MAXI GI (Disposable Instruments) IMPLANT
NET ROTH FOREIN BODY STNDRD GI (Disposable Instruments) IMPLANT
NET SPEC RTRVL STD RTHNT 2.5MM 230CM LF (Urology Supply)
NET SPECIMEN RETRIEVAL L230 CM STANDARD (Urology Supply) IMPLANT
NET SPECIMEN RETRIEVAL L230 CM STANDARD SHEATH OD2.5 MM L6 CM X W3 CM (Urology Supply) IMPLANT
PAD GROUNDING NESSY PLATE 170 (Procedure Accessories)
PROBE COAG FIAPC 6.9FR 7.2FT CRCMF PLG (Endoscopic Supplies)
PROBE COAGULATION L7.2 FT (Endoscopic Supplies) IMPLANT
PROBE COAGULATION L7.2 FT CIRCUMFERENTIAL PLUG PLAY FUNCTIONALITY (Endoscopic Supplies) IMPLANT
PROBE ELECTROSURGICAL L220 CM FLEXIBLE (Procedure Accessories) IMPLANT
PROBE ELECTROSURGICAL L220 CM FLEXIBLE STRAIGHT FIRE OD2.3 MM FIAPC (Procedure Accessories) IMPLANT
PROBE ESURG FIAPC 2.3MM 220CM STRL FLXB (Procedure Accessories)
PROBE FIAPC CIRC 220MM (Endoscopic Supplies)
PROBE FIAPC STRGHT FIRE 220CM (Procedure Accessories)
PROBE GOLD 7FR (Procedure Accessories)
RETRIEVER ROTH NET STD 2.5MM (Urology Supply)
SNARE ELECTRO SURG 20MM (Procedure Accessories)
SNARE ESCP MED OVL SNS 2.4MM 240CM STRL (Procedure Accessories)
SNARE ESCP SPRL SNAREMASTER 2.8MM 20MM (Procedure Accessories)
SNARE MEDIUM OVAL L240 CM OD2.4 MM (Procedure Accessories) IMPLANT
SNARE MEDIUM OVAL L240 CM OD2.4 MM SENSATION LOOP SHORT THROW FLEXIBLE (Procedure Accessories) IMPLANT
SNARE SENSTN MED OVAL FLX BX40 (Procedure Accessories)
SNARE SPIRAL 230CM 2.8MM 20MM SNRMSTR RDG WIRE ENDOSCOPIC POLYPECTOMY (Procedure Accessories) IMPLANT
SNARE SPIRAL L230 CM OD2.8 MM ODSEC20 MM (Procedure Accessories) IMPLANT
SOL WATER STERILE 1000CC BTLE (Irrigation Solutions) ×1
SPONGE GAUZE L4 IN X W4 IN 16 PLY (Dressing) ×2 IMPLANT
SPONGE GAUZE L4 IN X W4 IN 16 PLY MAXIMUM ABSORBENT USP TYPE VII (Dressing) ×2 IMPLANT
SPONGE GZE CTTN CRTY 4X4IN LF NS 16 PLY (Dressing) ×3
SYRING SPOT EX ENDO MARK 5CC (Syringes, Needles)
SYRINGE 50 ML GRADUATE NONPYROGENIC DEHP (Syringes, Needles) ×2 IMPLANT
SYRINGE 50 ML GRADUATE NONPYROGENIC DEHP FREE PVC FREE BD MEDICAL (Syringes, Needles) ×2 IMPLANT
SYRINGE MED 50ML LF STRL GRAD N-PYRG (Syringes, Needles) ×3
SYRINGE SLIP-TIP 60CC (Syringes, Needles) ×1
TRAP MCS PLS LF STRL SCR CAP TUBE ID LBL (Procedure Accessories)
TRAP MUCUS SCREW CAP TUBE ID LABEL (Procedure Accessories) IMPLANT
TRAP MUCUS SCREW CAP TUBE ID LABEL MEDLINE PLASTIC CLEAR (Procedure Accessories) IMPLANT
TRAP MUCUS SPEC 40CC (Procedure Accessories)
WATER STERILE PLASTIC POUR BOTTLE 1000 (Irrigation Solutions) ×2 IMPLANT
WATER STERILE PLASTIC POUR BOTTLE 1000 ML (Irrigation Solutions) ×2 IMPLANT
WATER STRL 1000ML LF PLS PR BTL (Irrigation Solutions) ×3

## 2019-07-03 NOTE — Brief Op Note (Signed)
See generated note.

## 2019-07-03 NOTE — Anesthesia Postprocedure Evaluation (Signed)
Anesthesia Post Evaluation    Patient: Jonathan [name]    Procedure(s):  COLONOSCOPY, BIOPSY    Anesthesia type: general    Last Vitals:   Vitals Value Taken Time   BP 96/55 07/03/19 0815   Temp 36.5 C (97.7 F) 07/03/19 0811   Pulse 60 07/03/19 0815   Resp 20 07/03/19 0815   SpO2 96 % 07/03/19 0815                 Anesthesia Post Evaluation:     Patient Evaluated: PACU  Patient Participation: complete - patient participated  Level of Consciousness: awake and alert  Pain Score: 0  Pain Management: adequate  Multimodal analgesia pain management approach    Airway Patency: patent    Anesthetic complications: No      PONV Status: none    Cardiovascular status: acceptable  Respiratory status: acceptable  Hydration status: acceptable        Signed by: Delma Freeze, 07/03/2019 8:34 AM
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
## 2019-07-03 NOTE — Transfer of Care (Signed)
Anesthesia Transfer of Care Note    Patient: Jonathan [name]    Procedures performed: Procedure(s):  COLONOSCOPY, BIOPSY    Anesthesia type: General TIVA    Patient location:Phase II PACU    Last vitals:   Vitals:    07/03/19 0811   BP: 90/51   Pulse: 60   Resp: 18   Temp: 36.5 C (97.7 F)   SpO2: 98%       Post pain: Patient not complaining of pain, continue current therapy      Mental Status:sedated    Respiratory Function: tolerating face mask    Cardiovascular: stable    Nausea/Vomiting: patient not complaining of nausea or vomiting    Hydration Status: adequate    Post assessment: no apparent anesthetic complications, no reportable events and no evidence of recall    Signed by: Michaele Offer  07/03/19 8:12 AM

## 2019-07-03 NOTE — Interval H&P Note (Signed)
History and physical reviewed.  Patient re-examined, H and P unchanged.

## 2019-07-03 NOTE — Anesthesia Preprocedure Evaluation (Addendum)
Anesthesia Evaluation    AIRWAY    Mallampati: II    TM distance: >3 FB  Neck ROM: full  Mouth Opening:full  Planned to use difficult airway equipment: No CARDIOVASCULAR    irregular       DENTAL    no notable dental hx     PULMONARY    pulmonary exam normal and clear to auscultation     OTHER FINDINGS                  Relevant Problems   PULMONARY   (+) Dyspnea on exertion      NEURO/PSYCH   (+) H/O sick sinus syndrome   (+) History of TIA (transient ischemic attack)   (+) History of basal cell cancer      CARDIO   (+) Atrial fibrillation   (+) Cardiac pacemaker in situ--leadless nano stim   (+) Coronary artery disease involving native coronary artery of native heart without angina pectoris   (+) Dyspnea on exertion   (+) Essential hypertension   (+) Migraine equivalent       PSS Anesthesia Comments: LVEF via 2D Echocardiogram: 65%; Leadless permanent pacemaker        Anesthesia Plan    ASA 3     general                     intravenous induction   Detailed anesthesia plan: general IV        Post op pain management: per surgeon        Plan discussed with attending and CRNA.    ECG reviewed  pertinent labs reviewed             Signed by: Michaele Offer 07/03/19 7:14 AM

## 2019-07-03 NOTE — Discharge Instr - AVS First Page (Signed)
Endoscopy Discharge Instructions  General Instructions:  1. Following sedation, your judgement, perception, and coordination are considered impaired. Even though you may feel awake and alert, you are considered legally intoxicated. Therefore, until the next morning;  · Do not Drive  · Do not operate appliances or equipment that requires reaction time (e.g. Stove, electrical tools, machinery)  · Do not sign legal documents or be involved in important decisions.  · Do not smoke if alone  · Do not drink alcoholic beverages  · Go directly home and rest for several hours before resuming your routine activities.  · It is highly recommended to have a responsible adult stay with you for the next 24 hours    2. Tenderness, swelling or pain may occur at the IV site where you received sedation. If you experience this, apply warm soaks to the area. Notify your physician if this persists.    Report To Physician Any Of The Following:   1. Severe and persistent abdominal pain/bloating which does not subside within 2-3 hours  2. Large amount of rectal bleeding (some mucosal blood streaking may occur, especially if biopsy or polypectomy was done or if hemorrhoids are present.  3. Nausea/vomitting  4. Fevers/Chills within 24 hours after procedure. Temp>101deg F    In Addition:  If polyp has been removed, DO NOT take aspirin or aspirin containing products (e.g. Anacin, Alka Seltzer, Bufferin, Etc.) or non-steroidal anti-inflammatory drugs (e.g. Advil, Motrin, etc.) for 0 days unless otherwise advised by doctor. Tylenol or extra Strength Tylenol is permitted.    Additional Discharge Instructions  Your diet after the procedure: No restriction.    If you have questions or problems contact your MD immediately. If you need immediate attention, call your MD, 911 and/or go to nearest emergency room.

## 2019-07-03 NOTE — Progress Notes (Signed)
Fall Precautions interventions include the following due to sedation/anesthesia for procedures:    -Physical environment is free of clutter  -The patient is oriented to the environment  -Family members may be at the bedside when appropriate  -Stretchers are in the lowest position with wheels locked  -Non-skid socks are provided as foot covers  -Staff members assist the patient with ambulation to the bathroom  -Call bell in reach if staff member is away

## 2019-07-04 ENCOUNTER — Encounter: Payer: Self-pay | Admitting: Gastroenterology

## 2019-07-04 LAB — LAB USE ONLY - HISTORICAL SURGICAL PATHOLOGY

## 2019-07-05 NOTE — Rehab Evaluation ([facility]) (Signed)
Jonathan [name]  MRN: [id number]  Account: [id number]  Session Start: 07/05/2019 10:00:00 AM  Session Stop: 07/05/2019 11:00:00 AM    Total Treatment Minutes: 60.00 Minutes    Speech/Language Pathology  Outpatient Treatment Note    Medical Diagnosis: Rank Code      Description    Date of Onset    1    S06.2X1   Diffuse traumatic brain injury with loss of      05/15/2019                 consciousness of 30 minutes or less  1    S06.2X1   Diffuse traumatic brain injury with loss of      05/16/2019                 consciousness of 30 minutes or less  Therapy Diagnosis:  Rank Code      Description     Date of Onset    1    R41.840   Attention and concentration deficit              07/02/2019  2    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 That makes five times I've fallen  since I've been home."  Patient/Caregiver Goals: - I would like to be able to drive  - Be able to exercise more easily  - To cook again  - To read an article without losing focus at the end  Pain: Patient currently without complaints of pain.    OBJECTIVE  General Observation: Patient arrived to session unaccompanied. Actively engaged  in therapeutic activities. SLP and patient remained masked for duration of this  session d/t COVID19 precautions.    Interventions:       Development of Cognitive Skills:  .       Development of Cognitive Skills:  .      Session initially focused on reviewing patient's reported recent fall, where he  states he was weighing himself, lost his balance on the scale, and fell  backwards onto his elbows and hips. Patient became perservative and verbalizing  additional falls he's had  since returning home from the hospital, all reportedly  related to losing his balance. Patient expressed grave concern that he may fall  in his home and no one would be able to hear him or come help him. SLP provided  education re: fall alerts built into apple smartwatch, and suggested upgrading  his current watch to a model that has this future. Patient responded positively  to this recommendation and states he will look into it.    Additional standardized testing was administered via the Cognitive Linguistic  Quick Test to further assess patient's selective and alternating attention,  executive functions, and working memory. Results as follows:    Symbol Cancellation 12/12  Clock  Drawing 13/13  Story Retelling 10/10  Symbol Trails 10/10  Mazes 8/8  Design Generation 8/13 (criterion cut = 6)    Throughout testing, patient constantly seeking feedback about his performance,  impulsively beginning taks prior to being given all instructions, perseverating  on mild errors, and engaging in negative self-talk (Patient repeatedly saying "I  can't believe I can't do this" while achieving 100% accuracy on a novel task).  Suspect significant impact of underlying psychological/emotional distress on  patient's cognitive performance; will collaborate with neuropsychology.    STGs briefly addressed identifying functional areas where patient currently is  receiving assistance from his wife (eg., cooking, medication management,  calendar management, etc) and functional goals he wishes to work towards.  Required minimal cues from SLP to increase specificty; will further explore in  subsequent sessions.        Pain Reassessment: Pain was not reassessed as no pain was reported.  Education:    Education Provided: Plan of care. Rehab techniques and procedures. . Safety  issues and interventions. Home management activities. Compensatory cognitive  strategies/aids Compensatory cognitive strategies/aids  Brain Injury:  .       Audience:  Patient.       Mode: Explanation.  Demonstration.       Response: Verbalized understanding.  Needs reinforcement.    ASSESSMENT  Suspect significant impact of patient's underlying psychological/emotional  distress on  overall cognitive functioning.    Activity/Participation Problem List and Goals: No updates at this time.  Functions/Structures Problem List and Goals: No updates at this time.    PLAN  Treatment Frequency, Duration, and Interventions: Continue Speech/Language  Pathology to achieve goals per previously established Plan of Care.  Development of Plan of Care: There was no change to plan of care today. Patient  and/or family continue to be in agreement with plan of care.  The patient has been instructed to contact our clinic if any questions or  problems should arise.    Visit Number: Today's visit is number  2    Signed by: Cannon Kettle, M.A., CCC/SLP 07/05/2019 11:00:00 AM

## 2019-07-08 NOTE — Rehab Progress Note ([facility]) (Signed)
Jonathan [name]  MRN: [id number]  Account: [id number]  Session Start: 07/05/2019 11:00:00 AM  Session Stop: 07/05/2019 12:00:00 PM    Total Treatment Minutes: 60.00 Minutes    Physical Therapy  Outpatient Treatment Note    Medical Diagnosis: Rank Code      Description    Date of Onset    1    S06.2X1   Diffuse traumatic brain injury with loss of      05/15/2019                 consciousness of 30 minutes or less  1    S06.2X1   Diffuse traumatic brain injury with loss of      05/16/2019                 consciousness of 30 minutes or less  Therapy Diagnosis:  Rank Code      Description     Date of Onset    1    Z91.81    History of falling                               05/15/2019  2    R27.8     Other lack of coordination                       05/15/2019  3    R26.8     Other abnormalities of gait and mobility         05/15/2019  Demographics:            Age: 3Y            Gender: Male    Rehabilitation Precautions/Restrictions:   no driving  falls  Afib on Coumadin  Pacemaker    SUBJECTIVE  Patient Report: "I had a fall yesterday when I was stepping down off my scale.  It was a little slippery. I can't remember if I was in my socks or not."  Patient/Caregiver Goals: "To get back to exercising on my bike."  "To be able to walk around my neighborhood without losing my balance."  "To not be a burden to my family."  Pain: Patient currently without complaints of pain.    OBJECTIVE  General Observation: Pt received on-time in lobby. Pt ambulated back to gym  without AD, independently, slight shuffling gait pattern noted. Pt dons mask and  glasses for duration of session.  Other/Additional Findings: BP: 138/89  HR: 59    Interventions:       Neuromuscular Reeducation:  .       Therapeutic Activities:  .    Focus of session on improving dynamic balance with emphasis on stepping up/down  on compliant surfaces to minimize fall risk within the home:    NMRE:  -Foam pad: step up/down without UE support; 40 reps  -Foam pad:  lateral step up/over without UE support; 40 reps  -BOSU ball: step up/down with UE support; 30 reps  -Rockerboard: A/P and R/L weight-shifting without UE support though pt requiring  frequent support to steady especially with excessive posterior weight-shifting  leading to a LOB    TA:  -Dual task ambulation around first floor of hospital x2,000 feet total; pt  engaged in cognitive task (naming foods that begin with a given letter; naming  items within a category); no LOB or gait deviations noted though slightly slower  gait speed observed  Pain Reassessment: Pain was not reassessed as no pain was reported.  Education:    Education Provided: Precautions. Plan of care. Functional transfers. Fall  prevention/balance training. Safety. Gait. Stair/curb/environmental barrier  negotiation.       Audience: Patient.       Mode: Explanation.  Demonstration.       Response: Verbalized understanding.  Needs practice.    ASSESSMENT  Slight decrease in gait speed noted when engaged in dual task activity though no  LOB or staggering noted. Depressed mood and frustration with current functional  status continue to be biggest barriers to PT progress. Plan to discharge from  outpatient PT in 2 weeks. Plan to update HEP next session and discuss community  resources at discharge.    Activity/Participation Problem List and Goals: No updates at this time.  Functions/Structures Problem List and Goals: No updates at this time.    PLAN  Treatment Frequency, Duration, and Interventions: Continue Physical Therapy to  achieve goals per previously established Plan of Care.  Development of Plan of Care: There was no change to plan of care today. Patient  and/or family continue to be in agreement with plan of care.  The patient has been instructed to contact our clinic if any questions or  problems should arise.    Visit Number: Today's visit is number  16    Signed by: Bertis Ruddy, PT 07/05/2019 12:00:00 PM

## 2019-07-08 NOTE — Rehab Progress Note ([facility]) (Signed)
Jonathan [name]  MRN: [id number]  Account: [id number]  Session Start: 07/05/2019 9:00:00 AM  Session Stop: 07/05/2019 9:58:00 AM    Total Treatment Minutes: 58.00 Minutes    Occupational Therapy  Outpatient Treatment Note    Medical Diagnosis: Rank Code      Description    Date of Onset    1    S06.2X1   Diffuse traumatic brain injury with loss of      05/15/2019                 consciousness of 30 minutes or less  1    S06.2X1   Diffuse traumatic brain injury with loss of      05/16/2019                 consciousness of 30 minutes or less  Therapy Diagnosis:  Rank Code      Description     Date of Onset    1    R27.8     Other lack of coordination                       05/16/2019  2    R41.844   Frontal lobe and executive function deficit      05/16/2019  3    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 I was standing on my scale  and that's a slippery surface so that wasn't good'  .  'I need to be able to drive because my wife is getting her knee replaced and  won't be able to'  .  Patient/Caregiver Goals: 1. return to driving    2. improve safety and endurance during exercise    3. improve memory  4. return to cooking    Pain: Baseline pain in low back; new pain (declines to rate) at hips and elbows  s/p fall    OBJECTIVE  General Observation: . pt received on time in lobby. +CLs and facemask.  Independent for mobility w/o AD    Interventions:       Self Care/Home Management: pt engages in Safety Questions as instructed and  dictated by OT. OT reads 20 questions depicting scenarios which are hazardous or  pose potential problem solving obsticals involving community access. Goal of pt  improving ability to access community for medical and therapy appointments  w/o  caregiver assistance. Completes as follows    100% accuracy when provided w/ increased time. Minnor difficutlies w/ working  memory though with cues to 'think about what you were working on' he is able to  redirect train of thought all instances    discuss POC during breaks including recommended duration of POC, goals, scope of  OT, and plan for upcoming interventions in OT. Pt agreeable to intervention plan  though apprehensive when discussing d/c, indicating he is 'no where near' PLOF.    Pain Reassessment: Pain was not reassessed as no pain was reported.    Education:  Education Provided: homework. visual scanning, discuss driving/seizure like  activity with neurologist  Audience: Patient.       Mode: Explanation. Handouts. Demonstration       Response: Verbalized understanding. Needs practice    ASSESSMENT  pt reports fall and new pain though denies true injury or hitting head. Pt cont  to report he is unable to return to meaningful I/ADLs 2/2 cog and physical  deficits, though during standardized testing [facility]/strength is all [facility]. Pt  appears frustrated w/ this feedback and all positive reinforcement. Plan for  simulated community mgmt during upcoming tx in order to improve pt's insight  into strength/weaknesses, L/R hazard detection, and visual motor response speed.  Mood serves as significant barrier to goals.    Activity/Participation Problem List and Goals: No updates at this time.  Functions/Structures Problem List and Goals: No updates at this time.    PLAN  Treatment Frequency, Duration, and Interventions: Continue Occupational Therapy  to achieve goals per previously established Plan of Care.  Development of Plan of Care: There was no change to plan of care today. Patient  and/or family continue to be in agreement with plan of care.  The patient has been instructed to contact our clinic if any questions or  problems should arise.    Visit Number: Today's visit is number  16    Signed by:  Particia Nearing, OTR/L 07/05/2019 10:00:00 AM

## 2019-07-09 ENCOUNTER — Encounter (INDEPENDENT_AMBULATORY_CARE_PROVIDER_SITE_OTHER): Payer: Self-pay
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
## 2019-07-09 NOTE — Rehab Evaluation ([facility]) (Signed)
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                              07/02/2019  3    R41.844   Frontal lobe and executive function deficit      07/02/2019  Demographics:            Age: 70Y            Gender: Male    Referring Clinician: Sabra Heck, MD  Rehabilitation Precautions/Restrictions:   no driving  falls  Afib on Coumadin  Pacemaker    SUBJECTIVE  Patient Report: "I wish we had started this therapy earlier"  Patient/Caregiver Goals: - I would like to be able to drive  - Be able to exercise more easily  - To cook again  - To read an article without losing focus at the end  Pain: Patient currently without complaints of pain.    OBJECTIVE  General Observation: Patient arrived to session unaccompanied. Session start  delayed by 10 minutes for patient toileting. SLP and patient remained masked for  duration of session d/t COVID19 precautions.    Interventions:       Development of Cognitive Skills:  .       Development of Cognitive Skills:  .      STG 1 (10 sessions from 07/02/2019): Patient will initiate verbal rehearsal  followed by external encoding strategy (eg., electronic notes, visual  organization system, writing notes in systematic manner) at least two times per  session to proceduralize trained encoding strategies with initial instruction  and minimal verbal cues.  PROGRESS - Following discussion of pros/cons of paper-based versus eletronic  external  system, patient expressed desire to keep all notes/calendars/reminders  electronic as this was in line with his premorbid system. SLP instructed in use  of reminders app to set up external alarms for three prospective tasks to be  completed by next session. Patient required initial instruction + min verbal  cues to set appropriate time frame for reminders. Observed patient's smartphone  applications currently to be disorganized with extraneous/nonessential  information; will focus next session on streamlining information to reduce  cognitive burden of accessing desired information. Patient's underlying  emotional distress/internal distractions noted to be a a barrier to completing  scheduled tasks this session, with patient requiring redirection and cues for  relaxation after becoming upset about an email he had recieved.    STG 1 (10 sessions from 07/02/2019): Patient will use 1-2 trained graphic  organizers (eg., goal attainment scaling, goal-plan-do-review) to identify  and  breakdown complex functional tasks/problems following initial instruction and  minimal verbal cues.  PROGRESS - Patient completed Psychologist, clinical re: Community education officer of routine  activities broken down into functional categories, including home tasks,  self-care, exercise, social/lesiure, medical, financial, etc. Patient required  no more than occasional verbal cues to identify appropriate tasks and/or  increase specifity.      Pain Reassessment: Pain was not reassessed as no pain was reported.  Education:    Education Provided: Plan of care. Rehab techniques and procedures. Safety issues  and interventions. Home management activities. Compensatory cognitive  strategies/aids. Brain injury .       Audience: Patient.       Mode: Explanation.  Demonstration.       Response: Applied knowledge.  Verbalized understanding.  Demonstrated skill.  Needs practice.    ASSESSMENT  Explored options for electronic external systems to manage  prospective  information. Underlying emotional distress/internal distractions noted to be a  barrier to sustained attention to therapeutic tasks.    Activity/Participation Problem List and Goals: No updates at this time.  Functions/Structures Problem List and Goals: No updates at this time.    PLAN  Treatment Frequency, Duration, and Interventions: Continue Speech/Language  Pathology to achieve goals per previously established Plan of Care.  Development of Plan of Care: There was no change to plan of care today. Patient  and/or family continue to be in agreement with plan of care.  The patient has been instructed to contact our clinic if any questions or  problems should arise.    Visit Number: Today's visit is number  3    Signed by: Cannon Kettle, M.A., CCC/SLP 07/09/2019 9:00:00 AM

## 2019-07-09 NOTE — Rehab Progress Note ([facility]) (Signed)
Jonathan [name]  MRN: [id number]  Account: [id number]  Session Start: 07/09/2019 9:05:00 AM  Session Stop: 07/09/2019 10:00:00 AM    Total Treatment Minutes: 55.00 Minutes    Physical Therapy  Outpatient Treatment Note    Medical Diagnosis: Rank Code      Description    Date of Onset    1    S06.2X1   Diffuse traumatic brain injury with loss of      05/15/2019                 consciousness of 30 minutes or less  1    S06.2X1   Diffuse traumatic brain injury with loss of      05/16/2019                 consciousness of 30 minutes or less  Therapy Diagnosis:  Rank Code      Description     Date of Onset    1    Z91.81    History of falling                               05/15/2019  2    R27.8     Other lack of coordination                       05/15/2019  3    R26.8     Other abnormalities of gait and mobility         05/15/2019  Demographics:            Age: 29Y            Gender: Male    Rehabilitation Precautions/Restrictions:   no driving  falls  Afib on Coumadin  Pacemaker    SUBJECTIVE  Patient Report: "I walked for a total of 2.5 miles outside on Sunday. I felt  unsteady when I would turn my head backwards to look for cars/people behind me."    Patient/Caregiver Goals: "To get back to exercising on my bike."  "To be able to walk around my neighborhood without losing my balance."  "To not be a burden to my family."  Pain: Patient currently without complaints of pain.    OBJECTIVE  General Observation: Pt received on-time in lobby. Pt ambulated back to gym  without AD, independently, slight shuffling gait pattern noted. Pt dons mask and  glasses for duration of session.  Other/Additional Findings: BP: 116/74  HR: 62  SpO2: 99%    Interventions:       Therapeutic Activities:  .       Neuromuscular Reeducation:  .    TA:  -Upright bike x10 minutes at resistance level 5 in order to improve LE endurance  and coordination.  -Updated HEP to progress standing and dynamic balance activities. Pt completed  the following  activities: SLS with marching; SLS with hip abduction (2x10 on  each leg with occasional UE support for steadying; pt able to complete 8-10 reps  at a time)  -Provided printed hand-out of updated HEP.  -Discussed recommendation to purchase a bike trainer so pt may return to  stationary bike riding at home. Provided printed hand-out for an option to  purchase.    NMRE:  -Dynamic balance training: ambulation with horizontal and vertical head turns,  ball toss to self while ambulating, ball toss to therapist (requiring pt to turn  to look  behind him to catch ball while walking fwd).      Pain Reassessment: Pain was not reassessed as no pain was reported.  Education:    Education Provided: Precautions. Plan of care. Fall prevention/balance training.  Safety. Gait. Home exercise/activity plan.       Audience: Patient.       Mode: Explanation.  Demonstration.  Teacher, English as a foreign language provided.       Response: Verbalized understanding.  Needs practice.    ASSESSMENT  Provided an updated HEP this session and discussed purchase of a bike trainer to  allow pt to bike safely at home this winter. Pt does not feel comfortable  returning to a community gym at this time due to COVID. Will plan to decrease to  1x/week for the next 2 weeks then discharge from outpatient PT.    Activity/Participation Problem List and Goals: No updates at this time.  Functions/Structures Problem List and Goals: No updates at this time.    PLAN  Treatment Frequency, Duration, and Interventions: Continue Physical Therapy to  achieve goals per previously established Plan of Care.  Development of Plan of Care: There was no change to plan of care today. Patient  and/or family continue to be in agreement with plan of care.  The patient has been instructed to contact our clinic if any questions or  problems should arise.    Visit Number: Today's visit is number  17    Signed by: Bertis Ruddy, PT 07/09/2019 10:00:00 AM

## 2019-07-10 ENCOUNTER — Encounter (INDEPENDENT_AMBULATORY_CARE_PROVIDER_SITE_OTHER): Payer: Self-pay | Admitting: Clinical Cardiac Electrophysiology

## 2019-07-11 NOTE — Rehab Progress Note ([facility]) (Signed)
NAMEREGORY CLUNEY  MRN: [id number]  Account: [id number]  Session Start: 07/11/2019 3:05:00 PM  Session Stop: 07/11/2019 4:00:00 PM    Total Treatment Minutes: 55.00 Minutes    Physical Therapy  Outpatient Treatment Note    Medical Diagnosis: Rank Code      Description    Date of Onset    1    S06.2X1   Diffuse traumatic brain injury with loss of      05/15/2019                 consciousness of 30 minutes or less  1    S06.2X1   Diffuse traumatic brain injury with loss of      05/16/2019                 consciousness of 30 minutes or less  Therapy Diagnosis:  Rank Code      Description     Date of Onset    1    Z91.81    History of falling                               05/15/2019  2    R27.8     Other lack of coordination                       05/15/2019  3    R26.8     Other abnormalities of gait and mobility         05/15/2019  Demographics:            Age: 68Y            Gender: Male    Rehabilitation Precautions/Restrictions:   no driving  falls    SUBJECTIVE  Patient Report: "My goals are to run again, to swim again, to be able to walk 1  km in 10 minutes, to have more confidence when I'm walking, and to not be so  scared of falling. I don't want to become a vegetable and be a burden to my  wife."  Patient/Caregiver Goals: "To get back to exercising on my bike."  "To be able to walk around my neighborhood without losing my balance."  "To not be a burden to my family."  Pain: Patient currently without complaints of pain.    OBJECTIVE  General Observation: Pt received on-time in lobby. Pt ambulated back to gym  without AD, independently, slight shuffling gait pattern noted. Pt dons mask and  glasses for duration of session.    Interventions:       Therapeutic Activities:  .    TA:  Focus of session on minimizing pt's fear of falling and donning static Zero-G  harness to attempt fast walking and jogging.  -Donned large harness and secured to static Zero G harness system.  -15x50 feet of fast walking  -15x50 feet of  jogging; pt initially attempting to sprint in system causing  anterior momentum and erratic performance; therapist cued to slow down to a  comfortable jog. No major losses of balance or falls prevented by harness  system.    Pain Reassessment: Pain was not reassessed as no pain was reported.  Education:    Education Provided: Precautions. Plan of care. Fall prevention/balance training.  Safety. Gait.       Audience: Patient.       Mode: Explanation.  Demonstration.  Response: Verbalized understanding.  Needs practice.  Needs reinforcement.    ASSESSMENT  Though pt was initially reluctant to use of harness system to trial jogging ("I  don't want to admit to myself that I need that safety mechanism in place") he  reported feeling pleased that he was able to attempt jogging again in a safe way  this session. Therapist enforced recommendation that pt is not safe to attempt  at home. Pt expressed understanding. Plan for 2 more outpatient PT sessions to  formalize HEP/exercise plan.    Activity/Participation Problem List and Goals: No updates at this time.  Functions/Structures Problem List and Goals: No updates at this time.    PLAN  Treatment Frequency, Duration, and Interventions: Continue Physical Therapy to  achieve goals per previously established Plan of Care.  Development of Plan of Care: There was no change to plan of care today. Patient  and/or family continue to be in agreement with plan of care.  The patient has been instructed to contact our clinic if any questions or  problems should arise.    Visit Number: Today's visit is number  18    Signed by: Bertis Ruddy, PT 07/11/2019 4:00:00 PM

## 2019-07-11 NOTE — Rehab Evaluation ([facility]) (Signed)
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    OBJECTIVE  General Observation: Patient arrived to session unaccompanied. SLP and patient  remained masked for duration of session d/t COVID19 precautions.    Interventions:       Development of Cognitive Skills:  .       Development of Cognitive Skills:  .      STG 1 (10 sessions from 07/02/2019): Patient will initiate verbal rehearsal  followed by external encoding strategy (eg., electronic notes, visual  organization system, writing notes in systematic manner) at least two times per  session to proceduralize trained encoding strategies with initial instruction  and minimal verbal cues.  PROGRESS - Patient followed through with 2/3 prospective tasks using Reminders  application on smartphone. Despite conversation last session,  patient  acknowledged feeling as though using his smartphone for all information is too  overwhelming, and wishes to transition to a combination of electronic  calendar/reminders/alarms and written notes. Patient provided with initial  instruction for binder system and then independently created appropriate  sections to organize loose papers. Following review of expectation, patient  initiated using written list to plan for next session and generate list of  talking points to discuss with physical therapist next hour, as well as  generating 3 novel reminders for tasks to be completed this weekend given  minimal cues to increase specificity.        STG 1 (10 sessions from 07/02/2019): Patient will use 1-2 trained graphic  organizers (eg., goal attainment scaling, goal-plan-do-review) to identify and  breakdown complex functional tasks/problems following initial instruction and  minimal verbal cues.  NOT DIRECTLY ADDRESSED THIS SESSION    Introduced application  based structured therapy program with focus on attention  and memory exercises. Patient educated re: application functions that allow for  increased complexity as patient's accuracy increases, with automated scaffolding  to provide challenges at each level of difficulty.  Customized treatment  exercises to adjust task types and level of complexity to meet patient's areas  of deficits and accommodate current endurance needs.  Patient educated verbally  and visually how to access new tasks, modify level of difficulty or length, and  pause and continue later.  Recommended daily 15-20 minute home practice at least  3x/week.          Pain Reassessment: Pain was not reassessed as no pain was reported.  Education:    Education Provided: Plan of care. Rehab techniques and procedures. Safety issues  and interventions. Home management activities. Compensatory cognitive  strategies/aids. Brain injury. .       Audience: Patient.       Mode: Explanation.  Demonstration.        Response: Applied knowledge.  Verbalized understanding.  Demonstrated skill.  Needs practice.    ASSESSMENT  Introduced application-based strcuctured therapy program for home practice to  which patient was receptive. Continued to explore external memory systems, with  patient expressing desire for combination of electronic  calendar/reminders/alarms and written to do lists. Patient initiated using  aforementioned strategies effectively 2x this session.    Activity/Participation Problem List and Goals: No updates at this time.  Functions/Structures Problem List and Goals: No updates at this time.    PLAN  Treatment Frequency, Duration, and Interventions: Continue Speech/Language  Pathology to achieve goals per previously established Plan of Care.  Development of Plan of Care: There was no change to plan of care today. Patient  and/or family continue to be in agreement with plan of care.  The patient has been instructed to contact our clinic if any questions or  problems should arise.    Visit Number: Today's visit is number  4    Signed by: Cannon Kettle, M.A., CCC/SLP 07/11/2019 3:00:00 PM

## 2019-07-12 ENCOUNTER — Inpatient Hospital Stay
Admission: RE | Admit: 2019-07-12 | Discharge: 2019-07-12 | Disposition: A | Discharge status: Home / Self Care | Payer: Medicare Other | Source: Ambulatory Visit | Attending: Specialist | Admitting: Specialist

## 2019-07-12 DIAGNOSIS — I482 Chronic atrial fibrillation, unspecified: Secondary | ICD-10-CM | POA: Insufficient documentation

## 2019-07-12 LAB — PT/INR
PT INR: 2 — ABNORMAL HIGH (ref 0.9–1.1)
PT: 22.5 s — ABNORMAL HIGH (ref 12.6–15.0)

## 2019-07-15 NOTE — Rehab Progress Note ([facility]) (Signed)
NAMEFREEMONT KLUS  MRN: [id number]  Account: [id number]  Session Start: 07/11/2019 1:00:00 PM  Session Stop: 07/11/2019 1:55:00 PM    Total Treatment Minutes: 55.00 Minutes    Occupational Therapy  Outpatient Treatment Note    Medical Diagnosis: Rank Code      Description    Date of Onset    1    S06.2X1   Diffuse traumatic brain injury with loss of      05/15/2019                 consciousness of 30 minutes or less  1    S06.2X1   Diffuse traumatic brain injury with loss of      05/16/2019                 consciousness of 30 minutes or less  Therapy Diagnosis:  Rank Code      Description     Date of Onset    1    R27.8     Other lack of coordination                       05/16/2019  2    R41.844   Frontal lobe and executive function deficit      05/16/2019  3    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  Patient/Caregiver Goals: 1. return to driving    2. improve safety and endurance during exercise    3. improve memory  4. return to cooking    Pain: Baseline pain in low back    OBJECTIVE  General Observation: . pt received on time in lobby. +CLs and facemask.  Independent for mobility w/o AD. Able to manage personal items    Interventions:       Physical Performance Test and Report:  Pt seen for tx w/ focus on assessing underlying skills needed to safely access  community w/ independence for medical and therapy appointments. These skills  include visual scanning, divided/alt attn, praxis, and visual perception. Pt  completes [place] Battery in 40 min in a quiet environment free from  distractions. Edu for final x5 min as noted below.  Pt completes topographical navigation task via pen and paper w/ 100%  accuracy  over 2 trials and fair+ frustration tolerance. Requires no cues of self pacing  on this date.  [place] Battery (RDB)  PERCEPTUAL ANALYSIS  Subtest  Raw Score  Profile Score  Pass/Fail  Shape perception  Incomplete letters  20  0  Pass  Visual Spatial Skills  Position Discrimination  20  0  Pass  Cube Analysis  10  0  Pass    ATTENTION  Subtest  Raw Score  Profile Score  Pass/Fail  Visual Attention  Es and Fs  N/A  0  Pass  Divided Attention  Es and Fs (?threes?)  N/A  0  Pass    PRAXIS SKILLS  Subtest  Raw Score  Profile Score  Pass/Fail  Copying, Gesture, Objects  16  0  Pass  Tapping and Sequencing  15  0  Pass    EXECUTIVE FUNCTIONING  Subtest  Raw Score  Profile Score  Pass/Fail  Key Search Test  10  0  Pass  Action Program Test  5  0  Pass  Rule Shift Cards Test  20  0  Pass  Sorting Test  4  0  Pass    COMPREHENSION  Subtest  Raw Score  Profile Score  Pass/Fail  Comprehension  10  0  Pass            Overall Battery Score  0  Total tests failed  0    Pain Reassessment: Pain was not reassessed as no pain was reported.    Education:  Education Provided: progress, driving       Audience: Patient.       Mode: Explanation.       Response: Verbalized understanding. Needs reinforcement    ASSESSMENT  Pt achieves overall battery score of 0 and fails 0 subtests; indicating very low  probability of impairment related to accessing community for medical and therapy  appointments without assistance. Edu provided to pt re: need for medical  clearance and possible impact of emotions/mood on ability to access community.  Plan for meal prep/recipe location task during upcoming tx.  Activity/Participation Problem List and Goals: No updates at this time.  Functions/Structures Problem List and Goals: No updates at this time.    PLAN  Treatment Frequency, Duration, and Interventions: Continue Occupational Therapy  to achieve goals per previously established Plan of Care.  Development of Plan of Care: There was no  change to plan of care today. Patient  and/or family continue to be in agreement with plan of care.  The patient has been instructed to contact our clinic if any questions or  problems should arise.    Visit Number: Today's visit is number  18    Signed by: Particia Nearing, OTR/L 07/11/2019 2:00:00 PM

## 2019-07-16 NOTE — Psych (Signed)
[name]  MRN: [id number]  Account: [id number]  Session Start: 07/16/2019 9:00:00 AM  Session Stop: 07/16/2019 9:53:00 AM    Total Treatment Minutes: 53.00 Minutes    Psychology Services  Outpatient Rehabilitation Progress Note    Rehab Diagnosis: TBI  Demographics:            Age: 26Y            Gender: Male    Medications and Allergies: Significant rehabilitation considerations:   NKA  Rehabilitation Precautions/Restrictions:   No rehabilitation precautions.    SUBJECTIVE  Patient Reports: Pt reported the following:  Feeling guilty  Lacking porportion in self-assessment  Feeling self-critical    Suicide Risk Screen: Patient has these primary or secondary behavioral health  diagnoses or complaints: Describes self as mildly depressed. 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  Mood was depressed.  No evidence of  psychosis or delusions.  He explicitly denied SI and HI      Interventions:   NPSY INDIVID HLTH INTERV INT   NPSY INDIVID HLTH INTERV ADD    ASSESSMENT    Impressions:  Pt reported the following:  Feeling guilty  Lacking porportion in self-assessment  Feeling self-critical    PLAN  Continued Psychology services are recommended to address: Continue to follow  patient as needed for emotional support, assistance with coping skills.  Recommendations:  Weekly behavioral intervention    Signed by: Volanda Napoleon, Psy.D. 07/16/2019 9:53:00 AM

## 2019-07-16 NOTE — Rehab Progress Note ([facility]) (Signed)
Jonathan [name]  MRN: [id number]  Account: [id number]  Session Start: 07/16/2019 11:00:00 AM  Session Stop: 07/16/2019 11:55:00 AM    Total Treatment Minutes: 55.00 Minutes    Physical Therapy  Outpatient Treatment Note    Medical Diagnosis: Rank Code      Description    Date of Onset    1    S06.2X1   Diffuse traumatic brain injury with loss of      05/15/2019                 consciousness of 30 minutes or less  1    S06.2X1   Diffuse traumatic brain injury with loss of      05/16/2019                 consciousness of 30 minutes or less  Therapy Diagnosis:  Rank Code      Description     Date of Onset    1    Z91.81    History of falling                               05/15/2019  2    R27.8     Other lack of coordination                       05/15/2019  3    R26.8     Other abnormalities of gait and mobility         05/15/2019  Demographics:            Age: 55Y            Gender: Male    Rehabilitation Precautions/Restrictions:   Pacemaker  falls    SUBJECTIVE  Patient Report: "I am walking 3 laps around my neighborhood each day. I'm going  a little slower so I'm not losing my balance as much."  Patient/Caregiver Goals: "To get back to exercising on my bike."  "To be able to walk around my neighborhood without losing my balance."  "To not be a burden to my family."  Pain: Patient currently without complaints of pain.    OBJECTIVE  General Observation: Pt received on-time in lobby. Pt ambulated back to gym  without AD, independently, slight shuffling gait pattern noted. Pt dons mask and  glasses for duration of session.  Other/Additional Findings: BP: 148/83  HR: 60    Interventions:       Therapeutic Activities:  .    TA:  -Upright bike x10 minutes at resistance level 5 to improve LE strength and  endurance  -Treadmill in static large Zero-G harness x10 minutes; speed: 1.2-1.4 mph;  verbal cuing to increase step length on L; ambulate quietly, and heel strike on  L; pt able to ambulate with single UE support x30  seconds at a time and without  UE support x10 seconds at a time.  -Overground walking in static Zero-G harness system:  -Fast walking; 10 laps  -Tandem walking: 6 laps  -Backward walking: 6 laps; verbal cuing to slow speed and increase step length;  pt experienced 2 losses of balance and 2 falls which were prevented by the  harness system.      Pain Reassessment: Pain was not reassessed as no pain was reported.  Education:    Education Provided: Precautions. Plan of care. Gait. Safety.       Audience: Patient.  Mode: Explanation.  Demonstration.       Response: Verbalized understanding.  Needs practice.    ASSESSMENT  Pt continues to focus on speed of completing activities vs. form and safety with  activities and experienced 2 losses of balance in the Zero G harness system when  walking backwards. Limited reception to feedback and cuing as pt remains  internally distracted. Pt reports compliance with ambulation in community though  reports he has not completed his balance HEP. Plan for discharge from outpatient  PT next visit.    Activity/Participation Problem List and Goals: No updates at this time.  Functions/Structures Problem List and Goals: No updates at this time.    PLAN  Treatment Frequency, Duration, and Interventions: Continue Physical Therapy to  achieve goals per previously established Plan of Care.  Development of Plan of Care: There was no change to plan of care today. Patient  and/or family continue to be in agreement with plan of care.  The patient has been instructed to contact our clinic if any questions or  problems should arise.    Visit Number: Today's visit is number  68    Signed by: Bertis Ruddy, PT 07/16/2019 12:00:00 PM

## 2019-07-17 ENCOUNTER — Telehealth (INDEPENDENT_AMBULATORY_CARE_PROVIDER_SITE_OTHER): Payer: Self-pay | Admitting: Gastroenterology

## 2019-07-17 NOTE — Rehab Progress Note ([facility]) (Signed)
Jonathan [name]  MRN: [id number]  Account: [id number]  Session Start: 07/16/2019 10:00:00 AM  Session Stop: 07/16/2019 10:55:00 AM    Total Treatment Minutes: 55.00 Minutes    Occupational Therapy  Outpatient Treatment Note    Medical Diagnosis: Rank Code      Description    Date of Onset    1    S06.2X1   Diffuse traumatic brain injury with loss of      05/15/2019                 consciousness of 30 minutes or less  1    S06.2X1   Diffuse traumatic brain injury with loss of      05/16/2019                 consciousness of 30 minutes or less  Therapy Diagnosis:  Rank Code      Description     Date of Onset    1    R27.8     Other lack of coordination                       05/16/2019  2    R41.844   Frontal lobe and executive function deficit      05/16/2019  3    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 I drove myself here today. I  still feel nervous about it"  .  re: cooking in therapy "I can't do it. It's just too much. I'll mess up"  .  Patient/Caregiver Goals: 1. return to driving    2. improve safety and endurance during exercise    3. improve memory  4. return to cooking    Pain: Baseline pain in low back    OBJECTIVE  General Observation: . pt received on time in lobby. +CLs and facemask.  Independent for mobility w/o AD. Able to manage personal items    Interventions:       Self Care/Home Management: OT instructs pt to locate recipe utelizing  computer to navigate internet in order to challenge [facility], frustration tolerance,  and progress towards upcoming meal prep tasks  OT provides parameters including meal must take <41min to complete and be  appropriate level of challenge. Pt requires instructions repeated multiple  times. Resistant to activity. Edu re: purpose  of working towards pt's  self-identified meaningful goals. Pt requiers max cues and encouragement to  initiate locating recipe. Pt identifies recipe of buttern noodles+cheese.  Discuss appropriate level of challenge v. what pt selected, as per self report  he has been able to complete basic meal prep w/ supervision. Pt anxious w/  increased rate of breathing. Stating he is not able to complete task as he is  afraid of 'messing up'. OT attempts to reframe task and purpose for pt however  pt not receptive to feedback. OT orients pt to clinic kitchen in order to  decrease apprehension. Pt edu'd that he can refuse task, however he had  identified task as a goal. Pt to 'consider' cooking  Additioanl edu re: do NOT drive if he feels anxious/not ready. Despite medical  clearence and pt demo'ing underlying skills which indicate low probability of  difficulty pt's mood serves as barrier/possible safety concern. Pt verbalized  understanding.    Pain Reassessment: Pain was not reassessed as no pain was reported.    Education:  Education Provided: see intervention       Audience: Patient.       Mode: Explanation.       Response: Verbalized understanding. No evidence of learning.    ASSESSMENT  LTG: (20 visits from 06/14/19): Pt will be grossly mod I for a.m. routine  including meal prep, bathing, and dressing in order to decrease burden of care  and return to meaningful roles/occupations  .  Pt has largely met this goal per self report. Possible cooking during upcoming  tx depending on pt's level of anxiety. Current deficits appear to be largely  result of mood/emotions rather than true cog deficits s/p TBI. Plan for POC  update during upcoming tx, likely to be impacted (shortened) in length by pt's  refusals and mood.  Activity/Participation Problem List and Goals: No updates at this time.  Functions/Structures Problem List and Goals: No updates at this time.    PLAN  Treatment Frequency, Duration, and Interventions: Continue  Occupational Therapy  to achieve goals per previously established Plan of Care.  Development of Plan of Care: There was no change to plan of care today. Patient  and/or family continue to be in agreement with plan of care.  The patient has been instructed to contact our clinic if any questions or  problems should arise.    Visit Number: Today's visit is number  19    Signed by: Particia Nearing, OTR/L 07/16/2019 11:00:00 AM

## 2019-07-17 NOTE — Telephone Encounter (Signed)
Patient called to request results

## 2019-07-17 NOTE — Telephone Encounter (Signed)
Please advise 

## 2019-07-17 NOTE — Rehab Evaluation ([facility]) (Signed)
Jonathan [name]  MRN: [id number]  Account: [id number]  Session Start: 07/16/2019 12:00:00 AM  Session Stop: 07/16/2019 12:00:00 AM    Total Treatment Minutes:  Minutes    Speech/Language Pathology  Outpatient Missed Visit Note    The patient did not attend the therapy appointment on 07/16/2019 at 1300.    Reason:  Scheduling conflict. Therapist out.  Future Appointments: The patient has additional appointments.    Signed by: Cannon Kettle, M.A., CCC/SLP 07/16/2019 1:00:00 PM

## 2019-07-18 NOTE — Telephone Encounter (Signed)
lvm for pt to call back regarding results

## 2019-07-18 NOTE — Rehab Evaluation ([facility]) (Signed)
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 That's  hard stuff. I was doing it this morning and it got to be difficult. I got  frustrated."  Patient/Caregiver Goals: - I would like to be able to drive  - Be able to exercise more easily  - To cook again  - To read an article without losing focus at the end  Pain: Patient currently without complaints of pain.    OBJECTIVE  General Observation: Patient arrived to session unaccompanied. SLP and patient  remained masked for duration of session d/t COVID19 precautions.    Interventions:       Development of Cognitive Skills:  .       Development of Cognitive Skills:  .      STG 1 (10 sessions from 07/02/2019): Patient will initiate verbal rehearsal  followed by external encoding strategy (eg., electronic notes, visual  organization system, writing notes in systematic manner) at least two times per  session to proceduralize trained encoding strategies with initial instruction  and minimal verbal cues.  PROGRESS - Following initial cue to access notes and initiate written  encoding,  patient generated a 3 item plan to review for next session.        STG 1 (10 sessions from 07/02/2019): Patient will use 1-2 trained graphic  organizers (eg., goal attainment scaling, goal-plan-do-review) to identify and  breakdown complex functional tasks/problems following initial instruction and  minimal verbal cues.  PROGRESS - Session focus primarily on introducing goal attainment scaling to  assist patient in setting objective goals related to brain injury recovery,  increase insight to progress made, and facilitate generating plans to achieve  patient centered goals. When prompted, patient able to generate a list of five  goals related to his recovery; however, goals were vague. Given template for  goal attainment scaling and frequent scaffolding questions, patient able to  generate two specific and measurable goals related to his physical recovery and  increasing his endurance. Given  minimal prompting, patient able to generate  approriate 3-step plan to achieve aforementioned goals within a specific time  frame (ie., one month). Patient given template to track daily progress for  review next session. Patient with positive response to therapeutic intervention  of goal attainment scaling.    Pain Reassessment: Pain was not reassessed as no pain was reported.  Education:    Education Provided: Plan of care. Rehab techniques and procedures. Safety issues  and interventions. Home management activities. Compensatory cognitive  strategies/aids. Brain injury recovery. .       Audience: Patient.       Mode: Explanation.  Demonstration.       Response: Verbalized understanding.  Demonstrated skill.  Needs practice.    ASSESSMENT  Introduced goal attainment scaling. Patient with positive response to generating  measurable and specific goals to track his progress in brain injury recovery.  Benefitted from use of template to organize information and generate plan to  achieve goals. Given tracking sheet for  homework to review next session.    Activity/Participation Problem List and Goals: No updates at this time.  Functions/Structures Problem List and Goals: No updates at this time.    PLAN  Treatment Frequency, Duration, and Interventions: Continue Speech/Language  Pathology to achieve goals per previously established Plan of Care.  Development of Plan of Care: There was no change to plan of care today. Patient  and/or family continue to be in agreement with plan of care.  The patient has been instructed to contact our clinic if any questions or  problems should arise.    Visit Number: Today's visit is number  5    Signed by: Cannon Kettle, M.A., CCC/SLP 07/18/2019 3:00:00 PM

## 2019-07-18 NOTE — Telephone Encounter (Signed)
One small benign polyp.  No obvious cause for his diarrhea.  I think it is due to one of his medicines as we discussed.

## 2019-07-19 ENCOUNTER — Telehealth (INDEPENDENT_AMBULATORY_CARE_PROVIDER_SITE_OTHER): Payer: Self-pay

## 2019-07-19 NOTE — Telephone Encounter (Signed)
Spoke with pt concerning results. Pt verbalized understanding. Pt said they are feeling better with no diarrhea

## 2019-07-19 NOTE — Rehab Progress Note ([facility]) (Signed)
NAMESIR [name]  MRN: [id number]  Account: [id number]  Session Start: 07/18/2019 1:00:00 PM  Session Stop: 07/18/2019 1:58:00 PM    Total Treatment Minutes: 58.00 Minutes    Occupational Therapy  Outpatient Progress Summary    Medical Diagnosis: Rank Code      Description    Date of Onset    1    S06.2X1   Diffuse traumatic brain injury with loss of      05/15/2019                 consciousness of 30 minutes or less  1    S06.2X1   Diffuse traumatic brain injury with loss of      05/16/2019                 consciousness of 30 minutes or less  Therapy Diagnosis:  Rank Code      Description     Date of Onset    1    R27.8     Other lack of coordination                       05/16/2019  2    R41.844   Frontal lobe and executive function deficit      05/16/2019  3    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 I'm not as steady and I'm more tired"    Patient/Caregiver Goals: 1. return to driving    2. improve safety and endurance during exercise    3. improve memory  4. return to cooking  Pain: Patient currently without complaints of pain.  General Observation: pt received on time in lobby. Indep for mobility w/o AD.  +facemask. Has meal prep items in possession  Interventions:       Self Care/Home Management: pt seen for tx w/ emphasis on cooking/meal prep  in order to progress towards a.m. independence goal. Pt able to use stove and  sharps at a grossly supervision level to complete a daul cooking task (heat  sauce while cooking pasta). Pt appears anxious with frequent checking/rechecking  of items and overconcern for mistakes. No safety concerns observed on this date.    Using a self rating scale pt percieved self as requiring 25%  assistance and  dcompleting task only 50% well. Feedback provided by OT with pt being overall  unreceptive to postive feedback    Pain Reassessment: Pain was not reassessed as no pain was reported.  Education:  Education Provided: Plan of care. Meal prep       Audience: Patient.       Mode: Explanation.  Demonstration.       Response: Applied knowledge.  Verbalized understanding.   may need reinforcement    Initial Evaluation Date: 05/15/2019 11:00:00 AM  Reporting Period: 06/13/28 through 07/18/19  Number of Visits to Date: Today's visit is number  20    Activity/Participation Problem List and Goals: Other  Goal: LTG: (20 visits from 06/14/19): Pt will be grossly mod I for a.m. routine  including meal prep, bathing, and dressing in order  to decrease burden of care  and return to meaningful roles/occupations.  GOAL MET.  .  STG: (10 visits from 06/14/19): Pt will complete internet scavenger hunt with  100% accuracy in order to demonstrate improving ability to navigate the web and  opperate computer as needed to communicate electronically with loved ones and  medical providers. GOAL MET  .  UPDATED GOALS  .  N/A  .  Status: pt met LTG and STG. No further self care goals needed at this time  Functions/Structures Problem List and Goals:  Other: LTG: (20 visits from 06/14/19): Pt will tolerate 40 minutes of continuous  reading without reporting significant occular or cognitive fatiguein order to  return to leisure reading and ability to review medical appointments. GOAL IN  PROGRESS/LARGELY MET -- INCREASED  .  STG: (10 visits from 06/14/19): Pt will demonstrate improved frusteration  tolerance and healthy coping mechanisms/self pacing strategies as measured by  completing a challenging visual perceptual/cognitive task (e.g. Colorku, ipad  activity, etc) as self rated with no more than 2/10 frustration over a 50 min  time span. GOAL INCONSISTENTLY MET  .  UPDATED GOALS  .  LTG: (5 visits from 07/18/19): Pt will tolerate  55 minutes of continuous reading  without reporting significant occular or cognitive fatiguein order to return to  leisure reading and ability to review medical appointments.  .  Status: STG inconsistently met; LTG progressed. Goals to be added as appropriate  .  Progress Summary: 'Jonathan [name]' met or nearly met all STGs and LTGs. He continues to  view self, including progress and deficits, in a very negative light which has  been a significiant barrier to return to meaningful roles/occupations. He will  benefit from ongoing NPSY intervention as well as psychiatric collaboration for  best results. As mood/emotions serve as barrier, POC at no more than x5  additional visits in order to promote his engagement and demonstrate pt's skills  and abilities to him to prevent debility and disengagment which will result from  an immediate d/c at this time.  Equipment Provided/Recommended: No equipment was issued or recommended.  Rehabilitation Potential: Patient?s condition has potential to improve.  Patient is improving in response to therapy.  Maximum improvement is yet to be attained.  Necessity: Patient requires outpatient therapy in order to reduce Activities of  Daily Living or Instrumental Activities of Daily Living assistance to a  premorbid level.  Patient requires occupational therapy plan in order to function in community.  Patient requires occupational therapy to reduce fall risk, improve muscle  coordination, and improve functional mobility and independence  Recommended Consults:   psychiatry  Recommendations:  Occupational Therapy is recommended for 2x/week for 5 visits from 07/18/19 for  1:1 and group OT Occupational Therapy treatment is to include: Manual Therapy.  Neuromuscular Re-education.  Physical Performance Test.  Therapeutic Activity.  Therapeutic Exercise.  Self Care/Home Management.  Development of Cognitive Skills.  Assisted Technology Assessment.  Group Therapeutic Procedure.  Hot Cold Pack.  TENS  Unit.  Ultrasound.  Paraffin Bath.  Contrast Bath.  Sensory Integration.  Biofeedback Training.  Community/Work Integration.      Physician Certification: This is to certify that the above named patient, who is  under my care, requires skilled Occupational Therapy services as described in  the above treatment plan.  I further certify that the services outlined in this  plan are skilled and medically necessary.  I have reviewed this plan for  rehabilitation services, and I recommend that  these services continue 90 days  from 06/14/19  to meet the goals stated above.    Physician signature: __________________ MD,  Date of certification:  ____/____/____    Signed by: Particia Nearing, OTR/L 07/18/2019 2:00:00 PM

## 2019-07-23 NOTE — Rehab Progress Note ([facility]) (Signed)
Jonathan [name]  MRN: [id number]  Account: [id number]  Session Start: 07/23/2019 1:00:00 PM  Session Stop: 07/23/2019 1:57:00 PM    Total Treatment Minutes: 57.00 Minutes    Occupational Therapy  Outpatient Treatment Note    Medical Diagnosis: Rank Code      Description    Date of Onset    1    S06.2X1   Diffuse traumatic brain injury with loss of      05/15/2019                 consciousness of 30 minutes or less  1    S06.2X1   Diffuse traumatic brain injury with loss of      05/16/2019                 consciousness of 30 minutes or less  Therapy Diagnosis:  Rank Code      Description     Date of Onset    1    R27.8     Other lack of coordination                       05/16/2019  2    R41.844   Frontal lobe and executive function deficit      05/16/2019  3    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  Patient/Caregiver Goals: 1. return to driving    2. improve safety and endurance during exercise    3. improve memory  4. return to cooking    Pain: Baseline pain in low back    OBJECTIVE  General Observation: . pt received on time in lobby. +CLs and facemask.  Independent for mobility w/o AD. Able to manage personal items    Interventions:       Therapeutic Activities:    Colorku  OT cues pt to participate in familiar task Colorku following delay in order to  assess caryover/memory. Task designed to challenge visual scanning in visually  cluttered setting and sacadic eye movements needed for reading/interacting with  his environment(s)  Pt completes figure 2 with initially min A to recall directions. Pt requires 40  minutes and 1 verbal cue to initiate 1st placement of object in an area of least  challenge.    Discuss upcoming tx and POC, including plan to pause therapy x1  week starting  Dec 27th in order to allow pt to cont his POC w/ primary therapist; as primary  therapist will be out of town. OT provides gentle positive feedback re: progress  and successes.    Pain Reassessment: baseline low back pain    Education:  Education Provided: see intervention       Audience: Patient.       Mode: Explanation.       Response: Verbalized understanding. No evidence of learning.    ASSESSMENT  LTG: (5 visits from 07/18/19): Pt will tolerate 55 minutes of continuous reading  without reporting significant occular or cognitive fatiguein order to return to  leisure reading and ability to review medical appointments.  GOAL IN PROGRESS  .  Previously required min A for figure 2; at present requires 1 VC to initiate  task. Pt does best when provided w/ reinforcement throughout task re: lack of  errors/placement of objects being correct.  Pt does not c/o visual fatigue  during x40 min of visual scanning and saccadic eye movements. Only 1 instance of  overt negative self talk and pt is not as quick to reject positive feedback on  this date    Activity/Participation Problem List and Goals: Attention  Goal: LTG: (4 visits from 07/23/19): Pt will locate 100% of items on L and R  during dynamic scavenger hunt in order to promote safety and engagement,  including when grocery shopping and completing household management  Status: goal added to reflect self reported needs  Functions/Structures Problem List and Goals: No updates at this time.    PLAN  Treatment Frequency, Duration, and Interventions: Continue Occupational Therapy  to achieve goals per previously established Plan of Care.  Development of Plan of Care: There was no change to plan of care today. Patient  and/or family continue to be in agreement with plan of care.  The patient has been instructed to contact our clinic if any questions or  problems should arise.    Visit Number: Today's visit is number  21    Signed by: Particia Nearing, OTR/L  07/23/2019 2:00:00 PM

## 2019-07-23 NOTE — Psych (Signed)
NAMETILTON CATA  MRN: [id number]  Account: [id number]  Session Start: 07/23/2019 11:00:00 AM  Session Stop: 07/23/2019 11:53:00 AM    Total Treatment Minutes: 53.00 Minutes    Psychology Services  Outpatient Rehabilitation Progress Note    Rehab Diagnosis: TBI  Demographics:            Age: 2Y            Gender: Male    Medications and Allergies: Significant rehabilitation considerations:   NKA  Rehabilitation Precautions/Restrictions:   No rehabilitation precautions.    SUBJECTIVE  Patient Reports: Pt reported the following:  Feeling self-critical  Feeling guilty  Feeling less anxious  Feeling irritable    Suicide Risk Screen: Patient has these primary or secondary behavioral health  diagnoses or complaints: Describes self as mildly depressed. 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  Mood was depressed.  No evidence of  psychosis or delusions.  He explicitly denied SI and HI      Interventions:   NPSY INDIVID HLTH INTERV INT 30MIN   NPSY INDIVID HLTH INTERV ADD 15MIN    ASSESSMENT    Impressions:  Pt reported the following:  Feeling self-critical  Feeling guilty  Feeling less anxious  Feeling irritable      PLAN  Continued Psychology services are recommended to address: Continue to follow  patient as needed for emotional support, assistance with coping skills.  Recommendations:  Weekly behavioral intervention    Signed by: Kyra Searles, Psy.D. 07/23/2019 11:53:00 AM

## 2019-07-24 ENCOUNTER — Ambulatory Visit
Admission: RE | Admit: 2019-07-24 | Discharge: 2019-07-24 | Disposition: A | Discharge status: Home / Self Care | Payer: Medicare Other | Source: Ambulatory Visit | Attending: Specialist | Admitting: Specialist

## 2019-07-24 DIAGNOSIS — I482 Chronic atrial fibrillation, unspecified: Secondary | ICD-10-CM | POA: Insufficient documentation

## 2019-07-24 LAB — PT/INR
PT INR: 1.8 — ABNORMAL HIGH (ref 0.9–1.1)
PT: 21.1 s — ABNORMAL HIGH (ref 12.6–15.0)

## 2019-07-24 MED ORDER — PROPOFOL 10 MG/ML IV EMUL (WRAP)
INTRAVENOUS | Status: AC
Start: 2019-07-24 — End: ?
  Filled 2019-07-24: qty 20

## 2019-07-24 MED ORDER — FENTANYL CITRATE (PF) 50 MCG/ML IJ SOLN (WRAP)
INTRAMUSCULAR | Status: AC
Start: 2019-07-24 — End: ?
  Filled 2019-07-24: qty 2

## 2019-07-24 MED ORDER — SUCCINYLCHOLINE CHLORIDE 20 MG/ML IJ SOLN
INTRAMUSCULAR | Status: AC
Start: 2019-07-24 — End: ?
  Filled 2019-07-24: qty 10

## 2019-07-24 MED ORDER — ETOMIDATE 2 MG/ML IV SOLN
INTRAVENOUS | Status: AC
Start: 2019-07-24 — End: ?
  Filled 2019-07-24: qty 20

## 2019-07-24 MED ORDER — LIDOCAINE HCL (PF) 2 % IJ SOLN
INTRAMUSCULAR | Status: AC
Start: 2019-07-24 — End: ?
  Filled 2019-07-24: qty 5

## 2019-07-24 MED ORDER — ROCURONIUM BROMIDE 50 MG/5ML IV SOLN
INTRAVENOUS | Status: AC
Start: 2019-07-24 — End: ?
  Filled 2019-07-24: qty 5

## 2019-07-24 NOTE — Rehab Progress Note ([facility]) (Signed)
[name]  MRN: [id number]  Account: [id number]  Session Start: 07/24/2019 11:00:00 AM  Session Stop: 07/24/2019 11:57:00 AM    Total Treatment Minutes: 57.00 Minutes    Occupational Therapy  Outpatient Treatment Note    Medical Diagnosis: Rank Code      Description    Date of Onset    1    S06.2X1   Diffuse traumatic brain injury with loss of      05/15/2019                 consciousness of 30 minutes or less  1    S06.2X1   Diffuse traumatic brain injury with loss of      05/16/2019                 consciousness of 30 minutes or less  Therapy Diagnosis:  Rank Code      Description     Date of Onset    1    R27.8     Other lack of coordination                       05/16/2019  2    R41.844   Frontal lobe and executive function deficit      05/16/2019  3    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  Patient/Caregiver Goals: 1. return to driving    2. improve safety and endurance during exercise    3. improve memory  4. return to cooking    Pain: Baseline pain in low back    OBJECTIVE  General Observation: . pt received on time in lobby. +CLs and facemask.  Independent for mobility w/o AD. Able to manage personal items    Interventions:       Therapeutic Activities:    Colorku  OT cues pt to participate in familiar task Colorku to address problem solving,  attention, and visual organization. Task designed to challenge visual scanning  in visually cluttered setting and sacadic eye movements needed for  reading/interacting with his environment(s)  Pt completes figure 4 with initially min A to recall directions. Pt requires 40  minutes and 3-4 verbal cue to initiate placement of object in an area deemed  appropriate  by process of elimination.    Pt then engaged in complex  scavenger hunt requiring visual scanning and  alternating attention which patient completed independently.    Pain Reassessment: baseline low back pain    Education:  Education Provided: POC, perseveration, difficulty with thought transition       Audience: Patient.       Mode: Explanation.       Response: Verbalized understanding. No evidence of learning.    ASSESSMENT  LTG: (5 visits from 07/18/19): Pt will tolerate 55 minutes of continuous reading  without reporting significant occular or cognitive fatiguein order to return to  leisure reading and ability to review medical appointments. GOAL IN PROGRESS  .  Patient able to perform difficult cognitive activity for 40 minutes during this  treatment session  in order to progress towards his reading goal. Patient  required additional cueing today due to poor mental flexibility, frusturation,  and difficulty with transitioning his thinking skills. Continue with OT POC    Activity/Participation Problem List and Goals: No updates at this time.  Functions/Structures Problem List and Goals: No updates at this time.    PLAN  Treatment Frequency, Duration, and Interventions: Continue Occupational Therapy  to achieve goals per previously established Plan of Care.  Development of Plan of Care: There was no change to plan of care today. Patient  and/or family continue to be in agreement with plan of care.  The patient has been instructed to contact our clinic if any questions or  problems should arise.    Visit Number: Today's visit is number  22    Signed by: Rosine Door, OTR/L, CDRS 07/24/2019 12:00:00 PM

## 2019-07-24 NOTE — Discharge Summary (Signed)
Jonathan [name]  MRN: [id number]  Account: [id number]  Session Start: 07/24/2019 10:00:00 AM  Session Stop: 07/24/2019 10:55:00 AM    Total Treatment Minutes: 55.00 Minutes    Physical Therapy  Outpatient Discharge Summary    Medical Diagnosis: Rank Code      Description    Date of Onset    1    S06.2X1   Diffuse traumatic brain injury with loss of      05/15/2019                 consciousness of 30 minutes or less  1    S06.2X1   Diffuse traumatic brain injury with loss of      05/16/2019                 consciousness of 30 minutes or less  Therapy Diagnosis:  Rank Code      Description     Date of Onset    1    Z91.81    History of falling                               05/15/2019  2    R27.8     Other lack of coordination                       05/15/2019  3    R26.8     Other abnormalities of gait and mobility         05/15/2019  Demographics:            Age: 33Y            Gender: Male    Rehabilitation Precautions/Restrictions:   Pacemaker  falls    Initial Evaluation Date: 05/15/2019 10:00:00 AM  Reporting Period: 06/14/19 - 07/24/19  Visit Number: Today's visit is number  20    SUBJECTIVE  Patient Report: "I have been walking 4 kilometers a day and it takes me about  40-45 minutes."  Patient/Caregiver Goals: "To get back to exercising on my bike."  "To be able to walk around my neighborhood without losing my balance."  "To not be a burden to my family."  Pain: Patient currently without complaints of pain.    OBJECTIVE  General Observation: Pt received on-time in lobby. Pt ambulated back to gym  without AD, independently, slight shuffling gait pattern noted. Pt dons mask and  glasses for duration of session.    Special Tests: See below.    FGA: 28/30 (was 25/30 on 06/14/19; was 21/30 on 05/15/19)    10 meter walk test:  Self-selected speed: 1.07 m/s (was 1.2 m/s on 06/14/19; was 0.70 m/s on  05/15/19)  Fast speed: 1.57 m/s (was 1.79 m/s on 06/14/19; was 1.06 m/s on 05/15/19)    6 minute walk test: 1,504 feet  without AD (was 1,637 feet on 06/14/19)    Functional Gait Assessment:       GAIT LEVEL SURFACE: Normal - walks 6ft less than 5.5 sec, no assistive  device, good speed, no evidence of imbalance, normal gait, deviates no more than  6in outside 12in walkway width       CHANGE IN GAIT SPEED: Normal - able to smoothly change walking speed w/o  loss of balance or gait deviation. Significant difference in walking speeds  normal/fast/slow. Deviates no more than 6in outside 12in [address]  GAIT WITH HORIZONTAL HEAD TURNS: Normal - performs head turns smoothly with  no change in gait. Deviate no more than 6in outside 12in walkway       GAIT WITH VERTICAL HEAD TURNS: Normal - performs with no change in gait,  deviates no more than 6in outside 12in walkway       GAIT AND PIVOT TURN: Normal - pivot turns safely w/in 3 sec and stops  quickly with no loss of balance.       STEP OVER OBSTACLE: Normal - is able to step over 2 stacked shoe boxes  taped together w/o changing gait speed; no evidence of imbalance       GAIT WITH NARROW BASE OF SUPPORT: Normal - is able to ambulate for 10  steps, heel/toe with no staggering       GAIT WITH EYES CLOSED: Normal - walks 19ft, no assistive devices, good  speed, no evidence of imbalance, normal gait pattern, deviate no more than 6in  outside 12in walkway       AMBULATING BACKWARDS: Mild impairment - walks 34ft using assistive device,  slower speed, mild gait deviations, deviate 6-10in outside 12in walkway       STEPS: Mild impairment - alternating feet, must use rail  TOTAL SCORE: 28 /30      Functional Mobility:            Bed Mobility: All bed mobility including supine to sit, rolling,  scooting. with independence.            Transfers: Patient transferred bed to/from chair with independence.            Locomotion/Wheelchair: Not assessed.            Locomotion/Gait: Patient was independent with gait/ambulation for  1,500 feet . No assistive devices were required. Occasional shuffling  while  making turns, kyphotic posture            Stairs: Patient was modified independent for 16 . Patient used the  following equipment: Unilateral Railing. Patient used the following equipment:  Pt ascends with a reciprocal pattern without handrail, independently.  Pt requires use of single HR when descending with reciprocal pattern; mod I. .    Interventions:       Physical Performance Test with Report: Focus of session on re-assessment of  outcome measures, discussion of progress in PT, and plan of care.  All questions  were addressed.       Therapeutic Activities:  Provided printed hand-out for standing and dynamic  balance exercises:  SLS  SLS with contralateral hip flexion  SLS with contralateral hip abduction  Tandem stance  Tandem walk  Braiding  Fwd walk with head turns    Recommend pt complete balance HEP 3-4x/week.  In addition, provided printed handout for walking program, biking program (using  bike trainer if purchased by patient) and swimming (if pt is able to return to  swimming at a gym post-COVID.)  Pain Reassessment: Pain was not reassessed as no pain was reported.  Education:    Education Provided: Precautions. Plan of care. Functional transfers. Gait. Home  exercise/activity plan. Stair/curb/environmental barrier negotiation. Safety.       Audience: Patient.       Mode: Explanation.  Demonstration.  Teacher, English as a foreign language provided.       Response: Verbalized understanding.  Demonstrated skill.    ASSESSMENT  Clinical Status Changes: Patient has not experienced significant, unusual or  unexpected change in clinical status since their last visit.  Activity/Participation Problem List and Goals: Other  Goal: LTG: 10 visits from 06/14/19: Pt will improve FGA score from 21/30 to  26/30 to demonstrate improvement in dynamic balance and independence with  mobility within his home.  GOAL MET; FGA 38/30    LTG: 10 visits from 06/14/19: Pt will improve 6 minute walk test by 164 feet  (the MCID) in order to  demonstrate a clinically significant improvement in  endurance.  GOAL NOT MET    LTG: 10 visits from 06/14/19: Pt will negotiate 1 flight of stairs with  reciprocal pattern while carrying a 5lb object independently in order to allow  pt to safely carry laundry up and down the stairs at home.  GOAL MET  Goal Status: Resolved -  Status: Pt demonstrated improved dynamic balance as evidenced by score of 28/30  on FGA. His 6-minute walk test distance decreased by about 150 feet during this  reporting period. Pt plans to continue to focus on community ambulation post  discharge (currently walking 4 km a day). Pt is able to negotiate 1 flight of  stairs while carrying an object in 1 hand so that he can have safe use of  handrail in opposite hand while descending.  Functions/Structures Problem List and Goals:  Other: STG: 10 visits from 06/14/19: Pt will improve L hip extension MMT from  4/5 to 5/5 to improve activity tolerance and ambulation distance so pt can  attend MD and therapy appointments safely.  GOAL MET; B hip extension MMT: 5/5  Goal Status: Resolved  Status: Goal met.    Progress Summary: Overall, [name] has made good progress in outpatient PT during  his 20 visit plan of care. Progress was limited during the last 10 visits due to  depression, chronic GI issues (diarrhea with poor appetite and fatigue). Pt  experienced 2 falls at home during this plan of care. He demonstrated  improvements in dynamic balance and continues to require use of single HR when  descending flight of stairs in his home. Pt has been provided an extensive HEP  for balance as well as recommendations for continued ambulation in the  community, use of bike trainer, and swimming. Recommend pt return to outpatient  PT in 3 months for re-assessment.    Equipment Provided/Recommended: No equipment was issued or recommended.  Recommendations: Physical Therapy services are discontinued at this time  secondary to: Goals have been MET. No need for  skilled therapy intervention at  this time. Patient is independent (able to return demonstrate) home exercise  program.    Signed by: Bertis Ruddy, PT 07/24/2019 11:00:00 AM

## 2019-07-24 NOTE — Rehab Evaluation ([facility]) (Signed)
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 Busy. Everything seemed to go okay with the  car."  Patient/Caregiver Goals: - I would like to be able to drive  - Be able to exercise more easily  - To cook again  - To read an article without losing focus at the end  Pain: Patient currently without complaints of pain.    OBJECTIVE  General Observation: Patient arrived to session unaccompanied. SLP and patient  remained masked for duration of session d/t COVID19 precautions.    Interventions:       Development of Cognitive Skills:  .       Development of Cognitive Skills:  .      STG 1 (10 sessions from 07/02/2019): Patient will initiate verbal rehearsal  followed by external encoding strategy (eg., electronic notes, visual  organization system, writing notes in systematic manner) at least two times per  session to proceduralize trained encoding strategies with initial instruction  and minimal verbal cues.  PROGRESS - Independently initiated using weekly planner to plan for upcoming  weekend activities. Patient able to accurately reflect on yesterday's  completed  tasks and identify a high/low point of the day. Provided with verbal instruction  re: planning strategies for upcoming week to which patient verbalized  understanding.    STG 2 (10 sessions from 07/02/2019): Patient will use 1-2 trained graphic  organizers (eg., goal attainment scaling, goal-plan-do-review) to identify and  breakdown complex functional tasks/problems following initial instruction and  minimal verbal cues.  PROGRESS - Patient continues with excellent follow through to track daily  progress towards goals using provided template, met 2/2 goals yesterday.      Reviewed patient's progress on structured application based home exercise  program ([employer]). Reviewed two working memory tasks which patient  consistently achieving <75% accuracy to assess for breakdowns and strategy use.  Given direct instruction/modeling of working memory strategies fading to minimal  cues, patient achieved the following accuracy:  Say words in a category - 81%  Match words you hear - 67%  Patient noted with increased frustration when completing exercises; discussed  emotional barriers to optimal cognitive and physical performance to which  patient seemed receptive. Discussed strategies such as taking a break, deep  breathing, and relaxation to assist in managing frustration while completing  exercises at home. Customized treatment exercises to adjust task types and level  of complexity to meet patient's areas of deficits and accommodate current  endurance needs.  Patient educated verbally and visually how to access new  tasks, modify level of difficulty or length, and pause and continue later;  returned demonstration.      Pain Reassessment: Pain was not reassessed as no pain was reported.  Education:    Education Provided: Plan of care. Rehab techniques and procedures. Safety issues  and interventions. Home management activities. Compensatory cognitive  strategies/aids. Brain injury recovery. .        Audience: Patient.       Mode: Explanation.       Response: Verbalized understanding.    ASSESSMENT  Excellent follow through to use weekly planner to plan for upcoming  activities/events and track daily progress for goals set in prior session. In  context of structured working memory task, patient noted with increased  frustration and reduced insight to strategy use likely a barrier to optimal  cognitive performance. Will require further training and discussion with  neuropsych for coping strategies.    Activity/Participation Problem List and Goals: No updates at this time.  Functions/Structures Problem List and Goals: No updates at this time.    PLAN  Treatment Frequency, Duration, and Interventions: Continue Speech/Language  Pathology to achieve goals per previously established Plan of Care.  Development of Plan of Care: There was no change to plan of care today. Patient  and/or family continue to be in agreement with plan of care.  The patient has been instructed to contact our clinic if any questions or  problems should arise.    Visit Number: Today's visit is number  7    Signed by: Jonathan [name], M.A., CCC/SLP 07/24/2019 10:00:00 AM

## 2019-07-24 NOTE — Rehab Evaluation ([facility]) (Signed)
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    OBJECTIVE  General Observation: Patient arrived to session unaccompanied. SLP and patient  remained masked for duration of session d/t COVID19 precautions.    Interventions:       Development of Cognitive Skills:  .       Development of Cognitive Skills:  .      STG 1 (10 sessions from 07/02/2019): Patient will initiate verbal rehearsal  followed by external encoding strategy (eg., electronic notes, visual  organization system, writing notes in systematic manner) at least two times per  session to proceduralize trained encoding strategies with initial instruction  and minimal verbal cues.  PROGRESS - Following initial cue to access notes, patient able to recall 3/3  items planned for today's after >72 hour delay. Additionally, patient  provided  with visual organization system with weekly/daily overviews, embedded,  categorized and prioritized to do lists, and space for notes.  Provided with  verbal and written instruction [id number] strategies. Patient intially with  rigid thinking re: use of weekly planner and difficulty using designated spaces  for to do list items, but ultimately able to cutomize. Benefited initial from  mod verbal/visual cues, but faded to minimal cues to f/t with planning weekly  routine and nonroutine tasks by end of session.      STG 2 (10 sessions from 07/02/2019): Patient will use 1-2 trained graphic  organizers (eg., goal attainment scaling, goal-plan-do-review) to identify and  breakdown complex functional tasks/problems following initial instruction and  minimal verbal cues.  PROGRESS - Patient followed through with tracking daily progress for goals set  in prior session (increased walking speed/milage, reduced nap time) using  provided template and reportedly met his goals in 4/5 days; motivated my  tracking and patient acknowledged it is beneficial for him to see his progress.  Will continue to track daily progress and add goals as appropriate in subsequent  session.      Session also focused on providing education re: diminished energy reserves s/p  TBI, and the subsequent impact on cognitive, physical, and emotional endurance  for routine daily tasks. Patient acknowledged education provided and gave  several examples where he has felt reduced energy in his daily life, requiring  up to 1.5-2 hour naps each day. Patient is motivated to improve his endurance  and resume his routine activities.      Pain Reassessment: Pain was not reassessed as no pain was reported.  Education:    Education Provided: Plan of care. Rehab techniques and procedures. Safety issues  and interventions. Home management activities. Compensatory cognitive  strategies/aids. Brain injury recovery. .       Audience: Patient.       Mode:  Explanation.       Response: Verbalized understanding.  Needs practice.    ASSESSMENT  Patient responsive to education re: diminshed energy reserves s/p TBI. Excellent  follow through for tracking daily progress for goals set in prior session using  written template. Initial rigid thinking re: using paper-based structured weekly  planner, but ultimately able to d/y with planning weekly routine and nonroutine  tasks given cues.    Activity/Participation Problem List and Goals: No updates at this time.  Functions/Structures Problem List and Goals: No updates at this time.    PLAN  Treatment Frequency, Duration, and Interventions: Continue Speech/Language  Pathology to achieve goals per previously established Plan of Care.  Development of Plan of Care: There was no change to plan of care today. Patient  and/or family continue to be in agreement with plan of care.  The patient has been instructed to contact our clinic if any questions or  problems should arise.    Visit Number: Today's visit is number  6    Signed by: Cannon Kettle, M.A., CCC/SLP 07/23/2019 3:00:00 PM

## 2019-07-30 NOTE — Rehab Evaluation ([facility]) (Signed)
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                              07/02/2019  3    R41.844   Frontal lobe and executive function deficit      07/02/2019  Demographics:            Age: 31Y            Gender: Male    Referring Clinician: Sabra Heck, MD  Rehabilitation Precautions/Restrictions:   no driving  falls    SUBJECTIVE  Patient Report: "I went to go log into my [employer] yesterday to start doing  things for year end and I couldn't remember my password and I was up all night  thinking about how catastropic this can be."  Patient/Caregiver Goals: - I would like to be able to drive  - Be able to exercise more easily  - To cook again  - To read an article without losing focus at the end  Pain: Patient currently without complaints of pain.    OBJECTIVE  General Observation: Patient arrived to session on time unaccompanied. SLP and  patient remained masked for duration of session d/t COVID19 precautions.    Interventions:       Development of Cognitive Skills:  .       Development of Cognitive Skills:  .    Patient presents with several internal distractions today, reporting concerns  re: forgetting his log in information to a tax return program and concerns re:  his grandson undergoing surgery this morning. Patient became easily overwhelmed  this session, repeatedly saying "I'm so worried I'm so worried." SLP facilitated  controlled breathing (ie., square  breathing technique) which appeared to help  redirect patient after an extended period of time.    STG 1 (10 sessions from 07/02/2019): Patient will initiate verbal rehearsal  followed by external encoding strategy (eg., electronic notes, visual  organization system, writing notes in systematic manner) at least two times per  session to proceduralize trained encoding strategies with initial instruction  and minimal verbal cues.  PROGRESS - Patient continues to follow through with weekly planning template and  independently initiated upcoming activities/plans for this week. Reports  successful follow through with plan to cook Christmas dinner last week with  assistance from his wife.    STG 2 (10 sessions from 07/02/2019): Patient will use 1-2 trained graphic  organizers (eg., goal attainment scaling, goal-plan-do-review) to identify and  breakdown complex functional tasks/problems following initial instruction and  minimal verbal  cues.  PROGRESS - Attempted to introduce Counselling psychologist for Goal-Plan-Do-Review  framework to assist patient in resetting his password for his tax program.  However, patient unable to sustain attention to task, becoming easily  overwhelmed and revealing catastrophic thinking patterns (eg., if I can't log  into [employer] I can't file my taxes and I can get arrested or if I don't file  my taxes and I die it would leave a big mess for my wife and I can't do that to  her). SLP redirected patient to highly structured task re: dinner planning,  which patient able to sustain attention to for /R/20 minutes and achieved 100%  accuracy given minimal verbal cues for cognitive flexibility.            Pain Reassessment: Pain was not reassessed as no pain was reported.  Education:    Education Provided: Plan of care. Rehab techniques and procedures. Safety issues  and interventions. Home management activities. Compensatory cognitive  strategies/aids. Brain injury recovery. .       Audience: Patient.        Mode: Explanation.       Response: Verbalized understanding.    ASSESSMENT  Performance hinderd by several internal distractions today, with patient  demonstrating catastropic thinking patterns and difficulty to be redirected to  more unstructured tasks (ie., Network engineer for Goal-Plan-Do-Review).  Benefitted from redirection to a highly structured paper-based task and SLP  facilitation of controlled breathing techniques. Will collaborate with  neuropsychology for additional coping strategies as patient's cognitive/physical  performance likely impacted by presenting emotional stressors.    Activity/Participation Problem List and Goals: No updates at this time.  Functions/Structures Problem List and Goals: No updates at this time.    PLAN  Treatment Frequency, Duration, and Interventions: Continue Speech/Language  Pathology to achieve goals per previously established Plan of Care.  Development of Plan of Care: There was no change to plan of care today. Patient  and/or family continue to be in agreement with plan of care.  The patient has been instructed to contact our clinic if any questions or  problems should arise.    Visit Number: Today's visit is number  8    Signed by: Cannon Kettle, M.A., CCC/SLP 07/30/2019 11:00:00 AM

## 2019-08-01 ENCOUNTER — Inpatient Hospital Stay
Admission: RE | Admit: 2019-08-01 | Discharge: 2019-08-01 | Disposition: A | Discharge status: Home / Self Care | Payer: Medicare Other | Source: Ambulatory Visit | Attending: Specialist | Admitting: Specialist

## 2019-08-01 DIAGNOSIS — I482 Chronic atrial fibrillation, unspecified: Secondary | ICD-10-CM | POA: Insufficient documentation

## 2019-08-01 LAB — PT/INR
PT INR: 2.5 — ABNORMAL HIGH (ref 0.9–1.1)
PT: 27 s — ABNORMAL HIGH (ref 12.6–15.0)

## 2019-08-01 NOTE — Rehab Evaluation ([facility]) (Signed)
[name]  MRN: [id number]  Account: [id number]  Session Start: 08/01/2019 12:00:00 AM  Session Stop: 08/01/2019 12:00:00 AM    Total Treatment Minutes:  Minutes    Speech/Language Pathology  Outpatient Missed Visit Note    The patient did not attend the therapy appointment on 08/01/2019 at 1100.    Reason:  Patient did not show for appointment.  Future Appointments: The patient has additional appointments.    Signed by: Cannon Kettle, M.A., CCC/SLP 08/01/2019 11:00:00 AM

## 2019-08-05 ENCOUNTER — Ambulatory Visit: Payer: Medicare Other | Attending: Specialist

## 2019-08-05 DIAGNOSIS — R41840 Attention and concentration deficit: Secondary | ICD-10-CM | POA: Insufficient documentation

## 2019-08-05 DIAGNOSIS — R41844 Frontal lobe and executive function deficit: Secondary | ICD-10-CM | POA: Insufficient documentation

## 2019-08-05 DIAGNOSIS — R278 Other lack of coordination: Secondary | ICD-10-CM | POA: Insufficient documentation

## 2019-08-05 DIAGNOSIS — R411 Anterograde amnesia: Secondary | ICD-10-CM | POA: Insufficient documentation

## 2019-08-05 DIAGNOSIS — H5330 Unspecified disorder of binocular vision: Secondary | ICD-10-CM | POA: Insufficient documentation

## 2019-08-06 NOTE — Rehab Progress Note ([facility]) (Signed)
Jonathan [name]  MRN: [id number]  Account: [id number]  Session Start: 08/06/2019 1:00:00 PM  Session Stop: 08/06/2019 1:56:00 PM    Total Treatment Minutes: 56.00 Minutes    Occupational Therapy  Outpatient Treatment Note    Medical Diagnosis: Rank Code      Description    Date of Onset    1    S06.2X1   Diffuse traumatic brain injury with loss of      05/15/2019                 consciousness of 30 minutes or less  1    S06.2X1   Diffuse traumatic brain injury with loss of      05/16/2019                 consciousness of 30 minutes or less  Therapy Diagnosis:  Rank Code      Description     Date of Onset    1    R27.8     Other lack of coordination                       05/16/2019  2    R41.844   Frontal lobe and executive function deficit      05/16/2019  3    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  .  I think I'm getting worse.  .  Patient/Caregiver Goals: 1. return to driving    2. improve safety and endurance during exercise    3. improve memory  4. return to cooking    Pain: Baseline pain in low back    OBJECTIVE  General Observation: . pt received on time in lobby. +CLs and facemask.  Independent for mobility w/o AD. Able to manage personal items    Interventions:       Self Care/Home Management:       Therapeutic Activities:  [employer]  OT orients pt to novel visual scanning and executive functioning task Electronic  Snap Kit. Following orientation and encouragement pt able to complete figures 1,  3, and 11 w/ increased time and self-corrected errors.  Self Care  OT issues h/o regarding community resources including [facility]  ([facility]) and adaptive sports. Issued handouts and communicated to pt  electronically. Pt able to review handouts and navigate webpage w/o  overt  difficulties. Discuss progress and abilities, w/ pt cont to express negative  self talk and feeling of 'getting worse'. OT reinforces objective measures of  progress, plan of care, pt's right to seek outside services, community  resources, and ability to return for therapy in future if pt is objectively  worsening w/o skilled neuro-based OT intervention.    Pain Reassessment: baseline low back pain    Education:  Education Provided: POC, community resources       Audience: Patient.       Mode: Explanation. Handouts       Response: Verbalized understanding.    ASSESSMENT  LTG: (5 visits from 07/18/19): Pt will tolerate 55 minutes of continuous reading  without reporting significant occular or cognitive fatiguein order to return to  leisure reading and ability to review medical appointments. GOAL IN PROGRESS  .  pt cont to be most limited by mood and depression-like presentation. Plan for 2  additional visits to reinforce progress made, abilities, and prevent  disengagement/promote re-engagement in meaningful roles/occupations. Pt  verbalized understanding of community resources which will assist w/  facilitating progress towards this goal. Will cont to benefit from NPSY  intervention    Activity/Participation Problem List and Goals: No updates at this time.  Functions/Structures Problem List and Goals: No updates at this time.    PLAN  Treatment Frequency, Duration, and Interventions: Continue Occupational Therapy  to achieve goals per previously established Plan of Care.  Development of Plan of Care: There was no change to plan of care today. Patient  and/or family continue to be in agreement with plan of care.  The patient has been instructed to contact our clinic if any questions or  problems should arise.    Visit Number: Today's visit is number  23    Signed by: Particia Nearing, OTR/L 08/06/2019 2:00:00 PM

## 2019-08-07 NOTE — Rehab Evaluation ([facility]) (Signed)
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                              07/02/2019  3    R41.844   Frontal lobe and executive function deficit      07/02/2019  Demographics:            Age: 29Y            Gender: Male    Referring Clinician: Sabra Heck, MD  Rehabilitation Precautions/Restrictions:   no driving  falls    SUBJECTIVE  Patient Report: "I haven?t been tracking my naps because I'm ashamed at how long  I've been spending time in bed"  Patient/Caregiver Goals: - I would like to be able to drive  - Be able to exercise more easily  - To cook again  - To read an article without losing focus at the end  Pain: Patient currently without complaints of pain.    OBJECTIVE  General Observation: Patient arrived to session on time unaccompanied. SLP and  patient remained masked for duration of session d/t COVID19 precautions. Patient  repeatedly apologetic for missing last week's session as he mixed up the time of  his appointment.    Interventions:       Development of Cognitive Skills:  .       Development of Cognitive Skills:  .    STG 1 (10 sessions from 07/02/2019): Patient will initiate verbal rehearsal  followed by external encoding strategy (eg., electronic notes, visual  organization system, writing notes in systematic manner) at least two times per  session to proceduralize trained encoding strategies with initial instruction  and minimal verbal cues.  PROGRESS -  Patient independently utilizing weekly planning template to track  progress towards walking distance goal established in prior session; however,  has not been tracking upcoming appointments or planning daily activities. When  asked, patient states he is "ashamed" to write down his plans as he has been  spending increasing time in bed this past week. Patient required direct  instruction to input at least 2 upcoming daily to do list taks. Will benefit  from establishing a routine in next session.    STG 2 (10 sessions from 07/02/2019): Patient will use 1-2 trained graphic  organizers (eg., goal attainment scaling, goal-plan-do-review) to identify and  breakdown complex functional tasks/problems following initial instruction and  minimal verbal cues.  PROGRESS - Introduced Network engineer with providing topic breakdown to  support executive functioning (goal, plan, do, review/revise).  Patient given 4  written  topic cues with supporting question cues to develop comprehensive plan  to complex problem (filing 2020 taxes), as well as cues to review and reflect on  accuracy following task completion.  Patient f/t with initial planning with set  up and initial instruction and min-moderate cues for task breakdown and  establishment of reasonable time frame.    Session also focused on re-education re: the energy pie and likely rationale for  patient's perceived decreased performance in memory and balance activities.  Patient reports increased time in bed and no enjoyment in his daily activities;  discussed impact of known depression and anxiety on overall cognitive and  physical peformance along with patient's diminished energy reserve. SLP used  objective measures, such as patient's increasing overall accuracy/increasing  difficulty level of application based home program and progress towards walking  distance goals established in prior sessions. However, patient was not receptive  and was frequently perseverative on how he  does not want to engage in activities  at home and in the community because "it's not like before". Will follow up with  neuropsychology as patient's emotional stressors will likely continue to be a  barrier to progress.          Pain Reassessment: Pain was not reassessed as no pain was reported.  Education:    Education Provided: Plan of care. Rehab techniques and procedures. Safety issues  and interventions. Home management activities. Compensatory cognitive  strategies/aids. Brain injury recovery. .       Audience: Patient.       Mode: Explanation.       Response: Verbalized understanding.  Needs practice.    ASSESSMENT  Introduced Network engineer with providing topic breakdown to support executive  functioning (goal, plan, do, review/revise) to which patient responded  positively and able to f/t with initial planning given direct instruction.  Patient's emotional stressors and known underlying depression continue to serve  as a barrier to patient's progress, causing him to underestimate his true  cognitive and physical abilities.    Activity/Participation Problem List and Goals: No updates at this time.  Functions/Structures Problem List and Goals: No updates at this time.    PLAN  Treatment Frequency, Duration, and Interventions: Continue Speech/Language  Pathology to achieve goals per previously established Plan of Care.  Development of Plan of Care: There was no change to plan of care today. Patient  and/or family continue to be in agreement with plan of care.  The patient has been instructed to contact our clinic if any questions or  problems should arise.    Visit Number: Today's visit is number  9    Signed by: Cannon Kettle, M.A., CCC/SLP 08/06/2019 3:00:00 PM

## 2019-08-08 NOTE — Psych (Signed)
NAMEALTUS SMEDBERG  MRN: [id number]  Account: [id number]  Session Start: 08/08/2019 12:00:00 PM  Session Stop: 08/08/2019 12:53:00 PM    Total Treatment Minutes: 53.00 Minutes    Psychology Services  Outpatient Rehabilitation Progress Note    Rehab Diagnosis: TBI  Demographics:            Age: 76Y            Gender: Male    Medications and Allergies: Significant rehabilitation considerations:   NKA  Rehabilitation Precautions/Restrictions:   No rehabilitation precautions.    SUBJECTIVE  Patient Reports: Pt reported the following:  Feeling slightly less depressed  Forcasting negative events  Thinking negatively about himself  Attempting to dispute negative self-talk    Suicide Risk Screen: Patient has these primary or secondary behavioral health  diagnoses or complaints: Describes self as mildly depressed. 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  Mood was depressed.  No evidence of  psychosis or delusions.  He explicitly denied SI and HI      Interventions:   NPSY INDIVID HLTH INTERV INT   NPSY INDIVID HLTH INTERV ADD    ASSESSMENT    Impressions:  Pt reported the following:  Feeling slightly less depressed  Forcasting negative events  Thinking negatively about himself  Attempting to dispute negative self-talk      PLAN  Continued Psychology services are recommended to address: Continue to follow  patient as needed for emotional support, assistance with coping skills.  Recommendations:  Weekly behavioral intervention    Signed by: Volanda Napoleon, Psy.D. 08/08/2019 12:53:00 PM

## 2019-08-09 ENCOUNTER — Encounter (INDEPENDENT_AMBULATORY_CARE_PROVIDER_SITE_OTHER): Payer: Self-pay

## 2019-08-09 NOTE — Rehab Evaluation ([facility]) (Addendum)
Corrected 08/09/2019 1:55:56 PM    NAME: Jonathan [name]  MRN: [id number]  Account: [id number]  Session Start: 08/08/2019 11:00:00 AM  Session Stop: 08/08/2019 12:00:00 PM    Total Treatment Minutes: 60.00 Minutes    Speech/Language Pathology  Outpatient Progress Summary    Medical Diagnosis: Rank Code      Description    Date of Onset    1    S06.2X1   Diffuse traumatic brain injury with loss of      05/15/2019                 consciousness of 30 minutes or less  1    S06.2X1   Diffuse traumatic brain injury with loss of      05/16/2019                 consciousness of 30 minutes or less  Therapy Diagnosis:  Rank Code      Description     Date of Onset    1    R41.840   Attention and concentration deficit              07/02/2019  2    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 I?m  starting to keep lists I?m more goal-oriented. I mean that?s hard I don?t do it  100% of the time. I?m working on a framework of what I need to do. I?m a long  way from 100% successful at it but I know that?s what I need to do. I mean I?m  doing my exercise more regularly now. Again not 100%. Constant Therapy I need to  do more of that but I think it?s useful."    Patient/Caregiver Goals: - I would like to be able to drive  - Be able to exercise more easily  - To cook again  - To read an article without losing focus at the end  Pain: Patient currently without complaints of pain.  General Observation: Patient arrived to session on time unaccompanied. SLP and  patient remained masked for duration of session d/t COVID19 precautions.    Interventions:       Development of  Cognitive Skills:  .       Development of Cognitive Skills:  .  STG 1 (10 sessions from 07/02/2019): Patient will initiate verbal rehearsal  followed by external encoding strategy (eg., electronic notes, visual  organization system, writing notes in systematic manner) at least two times per  session to proceduralize trained encoding strategies with initial instruction  and minimal verbal cues.  PROGRESS - Patient followed through with 3 out of 4 planned activities from  prior session; identified barriers for incomplete task which were primarily  emotional/fear-based in nature. Patient required direct instruction + extended  time to generate list of 5 tasks to complete this weekend for follow up next  session.      STG 2 (10 sessions from 07/02/2019): Patient will use 1-2 trained graphic  organizers (eg., goal attainment scaling, goal-plan-do-review) to identify and  breakdown complex functional tasks/problems following initial instruction and  minimal verbal cues.  PROGRESS - Given initial review of expectation, patient referenced  Goal-Plan-Do-Review Network engineer established in prior session to review  steps generated for complex problem and add/revise steps as necessary given  information gathered to date. Patient required direct cues to inhibit desire to  plan for additional steps beyond his initial goal prior to completing initially  established steps.    Additionally, session focused on continued education re: impact of depression on  cognitive/physical functioning and prompted to generate list of questions to  discuss with neuropyschologist next hour (eg., What strategies can I use to  redirect from negative self talk")      Pain Reassessment: Pain was not reassessed as no pain was reported.  Education:    Education Provided: Plan of care. Progress to date. Planning strategies.  Cognitive strategies/aids. .       Audience: Patient.       Mode: Explanation.       Response: Verbalized  understanding.    Initial Evaluation Date: 07/02/2019 10:00:00 AM  Reporting Period: 07/02/2019 - 08/08/2019  Number of Visits to Date: Today's visit is number  10    Activity/Participation Problem List and Goals: Other  Goal: .  Status:  LTG 1 (20 sessions from 07/02/2019): Patient will use external systems to  independently recall 90% of novel detailed information (eg., medical  recommendations, exercise precautions, recall steps to complex projects/plans).  IN PROGRESS; GOAL ONGOING    STG 1 (10 sessions from 07/02/2019): Patient will initiate verbal rehearsal  followed by external encoding strategy (eg., electronic notes, visual  organization system, writing notes in systematic manner) at least two times per  session to proceduralize trained encoding strategies with initial instruction  and minimal verbal cues. MET    ***UPDATED GOALS***  LTG 1 (10 sessions from 08/08/2019): Patient will use external systems to  independently recall 90% of novel detailed information (eg., medical  recommendations, exercise precautions, recall steps to complex projects/plans).    Functions/Structures Problem List and Goals:  Other: .  Status:  LTG 1 (20 sessions from 07/02/2019): Patient will use 1-2 trained graphic  organizers (eg., goal attainment scaling, goal-plan-do-review) to identify and  breakdown complex functional tasks/problems independently over  3 sessions. IN  PROGRESS; GOAL ONGOING    STG 1 (10 sessions from 07/02/2019): Patient will use 1-2 trained graphic  organizers (eg., goal attainment scaling, goal-plan-do-review) to identify and  breakdown complex functional tasks/problems following initial instruction and  minimal verbal cues. MET    ***UPDATED GOALS***  LTG 1 (20 sessions from 07/02/2019): Patient will use 1-2 trained graphic  organizers (eg., goal attainment scaling, goal-plan-do-review) to identify and  breakdown complex functional tasks/problems independently over  3 sessions.      Progress Summary: Mr. Blan has  been a consistent participate in skilled SLP tx  since time of initial evaluation, meeting 2/2 STGs established. Patient has  demonstrated excellent follow through with use of weekly planner to plan for  upcoming activities/events and track daily progress set for personal goals (ie.,  increase walking distance/speed, reduce # of hours napping). Patient states it  has been beneficial for him to be "goal oriented". He benefits from use of  templates/graphic organizer (Goal-Plan-Do-Review) to facilitate task breakdown,  planning, and execution of IALDs,  including meal planning, financial management,  and schedule management. However, as confirmed by patient's treating  neuropsychologist, patient's vast emotional stressors which serve as internal  distractions and known underlying depression continue to serve as a barrier to  patient's progress, causing him to underestimate his true cognitive and physical  abilities leading to self-limiting behaviors. Focus of next reporting period  will be to increase independence in use of templates/graphic organizers to  facitliate task breakdown in order to plan and execute increasingly complex  tasks.    Equipment Provided/Recommended: No equipment was issued or recommended.  Rehabilitation Potential:  Patient?s condition has potential to improve.  Patient is improving in response to therapy.  I expect that the anticipated improvement is attainable in a  reasonable/predictable period of time.  Necessity: Patient requires speech language therapy plan in order to return to  Premorbid environment (or reside in new living environment).  Patient requires speech language therapy plan in order to return to work.  Patient requires speech language therapy plan in order to function in community.    Recommended Consults:  Continue with neuropsychology  Recommendations: Speech/Language Pathology services recommended for 2x/week for  10 sessions (20 sessions total) Speech/Language Pathology  treatment is to  include: Development of cognitive skills.    Physician Certification: This is to certify that the above named patient, who is  under my care, requires skilled Speech/Language Pathology services as described  in the above treatment plan.  I further certify that the services outlined in  this plan are skilled and medically necessary.  I have reviewed this plan for  rehabilitation services, and I recommend that these services continue 90 days  from 08/08/2019  to meet the goals stated above.    Physician signature: __________________ MD,  Date of certification:  ____/____/____    Annotated by: Cannon Kettle  Annotated: 08/09/2019 1:55:00 PM  Re-signed to generate New Plan of Care    Signed by: Cannon Kettle, M.A., CCC/SLP 08/08/2019 12:00:00 PM

## 2019-08-13 ENCOUNTER — Inpatient Hospital Stay
Admission: RE | Admit: 2019-08-13 | Discharge: 2019-08-13 | Disposition: A | Discharge status: Home / Self Care | Payer: Medicare Other | Source: Ambulatory Visit | Attending: Specialist | Admitting: Specialist

## 2019-08-13 DIAGNOSIS — I482 Chronic atrial fibrillation, unspecified: Secondary | ICD-10-CM | POA: Insufficient documentation

## 2019-08-13 LAB — PT/INR
PT INR: 2 — ABNORMAL HIGH (ref 0.9–1.1)
PT: 22.9 s — ABNORMAL HIGH (ref 12.6–15.0)

## 2019-08-13 NOTE — Rehab Evaluation ([facility]) (Signed)
[name]  MRN: [id number]  Account: [id number]  Session Start: 08/13/2019 11:00:00 AM  Session Stop: 08/13/2019 12:00:00 PM    Total Treatment Minutes: 60.00 Minutes    Speech/Language Pathology  Outpatient Treatment Note    Medical Diagnosis: Rank Code      Description    Date of Onset    1    S06.2X1   Diffuse traumatic brain injury with loss of      05/15/2019                 consciousness of 30 minutes or less  1    S06.2X1   Diffuse traumatic brain injury with loss of      05/16/2019                 consciousness of 30 minutes or less  Therapy Diagnosis:  Rank Code      Description     Date of Onset    1    R41.840   Attention and concentration deficit              07/02/2019  2    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    OBJECTIVE  General Observation: Patient arrived to session on time unaccompanied. SLP and  patient remained masked for duration of session d/t COVID19 precautions.    Interventions:       Development of Cognitive Skills:  .       Development of Cognitive Skills:  .  LTG 1 (10 sessions from 08/08/2019): Patient will use external systems to  independently recall 90% of novel detailed information (eg., medical  recommendations, exercise precautions, recall steps to complex projects/plans).  PROGRESS - When asked about weekend plans, patient Independently initiated using  notes to recall events.  Patient followed through with 5+ out of 5 planned  activities from  prior session. Patient required initial verbal cue to generate  list and plan daily activities for follow up next session; required only min  cues to assign approrpiate time frames.    LTG 2 (20 sessions from 07/02/2019): Patient will use 1-2 trained graphic  organizers (eg., goal attainment scaling, goal-plan-do-review) to identify and  breakdown complex functional tasks/problems independently over  3 sessions.  PROGRESS - Patent with strong emotional response to decision making re:  purchasing his own daily planner (SLP has been providing weekly planning  templates to date). Benefitted from use of Network engineer previously trained  (GOAL-PLAN-DO-REVIEW) to faciltiate task breakdown and identify supports needed  to ultimately follow through with goal. Patient required moderate verbal cues  fading to min assist  to respond appropriate to scaffolding questions and  identify barriers to task completion. As primary barriers were emotional/fear  based by patient's own recognition, SLP prompted tow ri Patient given blank  graphic organizers to faciltiate planning and task breakdown at home as he  initiates new projects.      Pain Reassessment: Pain was not reassessed as no pain was reported.  Education:    Education Provided: Plan of care. Rehab techniques and procedures. . Planning  strategies. Cognitive strategies/aids. .       Audience: Patient.       Mode: Explanation.       Response: Verbalized understanding.  Needs practice.    ASSESSMENT  Patent with strong emotional response to decision making re: purchasing his own  daily planner. Benefitted from use of Network engineer (GOAL-PLAN-DO-REVIEW) to  faciltiate task breakdown and identify supports needed to ultimately follow  through with goal.      Activity/Participation Problem List and Goals: No updates at this time.  Functions/Structures Problem List and Goals: No updates at this time.    PLAN  Treatment Frequency, Duration, and Interventions: Continue  Speech/Language  Pathology to achieve goals per previously established Plan of Care.  Development of Plan of Care: There was no change to plan of care today. Patient  and/or family continue to be in agreement with plan of care.  The patient has been instructed to contact our clinic if any questions or  problems should arise.    Visit Number: Today's visit is number  11    Signed by: Cannon Kettle, M.A., CCC/SLP 08/13/2019 12:00:00 PM

## 2019-08-15 NOTE — Rehab Progress Note ([facility]) (Signed)
Jonathan [name]  MRN: [id number]  Account: [id number]  Session Start: 08/13/2019 10:00:00 AM  Session Stop: 08/13/2019 10:56:00 AM    Total Treatment Minutes: 56.00 Minutes    Occupational Therapy  Outpatient Treatment Note    Medical Diagnosis: Rank Code      Description    Date of Onset    1    S06.2X1   Diffuse traumatic brain injury with loss of      05/15/2019                 consciousness of 30 minutes or less  1    S06.2X1   Diffuse traumatic brain injury with loss of      05/16/2019                 consciousness of 30 minutes or less  Therapy Diagnosis:  Rank Code      Description     Date of Onset    1    R27.8     Other lack of coordination                       05/16/2019  2    R41.844   Frontal lobe and executive function deficit      05/16/2019  3    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 I just feel like things  would be easier if something were to happen to me outside of my control.  .  Patient/Caregiver Goals: 1. return to driving    2. improve safety and endurance during exercise    3. improve memory  4. return to cooking    Pain: Baseline pain in low back    OBJECTIVE  General Observation: . pt received on time in lobby. +CLs and facemask.  Independent for mobility w/o AD. Able to manage personal items    Interventions:       Self Care/Home Management:       Therapeutic Activities:  Self Care: Pt completes x45 min reading tasks w/ independence and no need for  rest breaks. Able to accurately summarize information  When provided w/ positive feedback pt is unreceptive and begins negative self  talk. Discusses depressed affect, inability to get out of bed at times, and  states psychiatric referrals provided were not accepting new patients.  Denies  SI/HI. Provided with IPAC information for psychiatric management and crisis  management. Pt appreciative.    Pain Reassessment: baseline low back pain    Education:  Education Provided: POC, community resources       Audience: Patient.       Mode: Explanation. Handouts       Response: Verbalized understanding.    ASSESSMENT  LTG: (5 visits from 07/18/19): Pt will tolerate 55 minutes of continuous reading  without reporting significant occular or cognitive fatiguein order to return to  leisure reading and ability to review medical appointments. GOAL LARGELY MET.  Marland Kitchen  pt cont to be most limited by mood and depression-like presentation. Would have  met goal  on this date however discussed emergent concerns including mood. Plan  to review resources and referrals and d/c during upcoming tx    Activity/Participation Problem List and Goals: No updates at this time.  Functions/Structures Problem List and Goals: No updates at this time.    PLAN  Treatment Frequency, Duration, and Interventions: Continue Occupational Therapy  to achieve goals per previously established Plan of Care.  Development of Plan of Care: There was no change to plan of care today. Patient  and/or family continue to be in agreement with plan of care.  The patient has been instructed to contact our clinic if any questions or  problems should arise.    Visit Number: Today's visit is number  24    Signed by: Particia Nearing, OTR/L 08/13/2019 11:00:00 AM

## 2019-08-15 NOTE — Psych (Signed)
[place]  MRN: [id number]  Account: [id number]  Session Start: 08/15/2019 12:00:00 PM  Session Stop: 08/15/2019 12:53:00 PM    Total Treatment Minutes: 53.00 Minutes    Psychology Services  Outpatient Rehabilitation Progress Note    Rehab Diagnosis: TBI  Demographics:            Age: 18Y            Gender: Male    Medications and Allergies: Significant rehabilitation considerations:   NKA  Rehabilitation Precautions/Restrictions:   No rehabilitation precautions.    SUBJECTIVE  Patient Reports: Pt reported the following:  Feeling depressed  Holding a negative view of self, world, and future  Feelng self-criticism    Suicide Risk Screen: Patient has these primary or secondary behavioral health  diagnoses or complaints: Describes self as mildly depressed. Depression related  to medical situation Deneid Si and hI    OBJECTIVE  General Observation: Arrived on time.  Mood was depressed.  No evidence of  psychosis or delusions.  He explicitly denied SI and HI      Interventions:   NPSY INDIVID HLTH INTERV INT 30MIN   NPSY INDIVID HLTH INTERV ADD 15MIN    ASSESSMENT    Impressions:  Pt reported the following:  Feeling depressed  Holding a negative view of self, world, and future  Feelng self-criticism      PLAN  Continued Psychology services are recommended to address: Continue to follow  patient as needed for emotional support, assistance with coping skills.  Recommendations:  Weekly behavioral intervention    Signed by: Kyra Searles, Psy.D. 08/15/2019 12:53:00 PM

## 2019-08-20 NOTE — Psych (Signed)
Jonathan [name]  MRN: [id number]  Account: [id number]  Session Start: 08/20/2019 10:10:00 AM  Session Stop: 08/20/2019 11:03:00 AM    Total Treatment Minutes: 53.00 Minutes    Psychology Services  Outpatient Rehabilitation Progress Note    Rehab Diagnosis: TBI  Demographics:            Age: 22Y            Gender: Male    Medications and Allergies: Significant rehabilitation considerations:   NKA  Rehabilitation Precautions/Restrictions:   No rehabilitation precautions.    SUBJECTIVE  Patient Reports: Pt reported the following:  Feeling persistent anxiety  Engaging catastrphic thinking  Unable to connect to good things about self  Unable to connect to good things about others  Think in all or nothing term (all bad, no good)  Working on disputing negative thought patterns    Suicide Risk Screen: Patient has these primary or secondary behavioral health  diagnoses or complaints: Describes self as mildly depressed. 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  Mood was depressed.  No evidence of  psychosis or delusions.  He explicitly denied SI and HI      Interventions:   NPSY INDIVID HLTH INTERV INT   NPSY INDIVID HLTH INTERV ADD    ASSESSMENT    Impressions:  Pt reported the following:  Feeling persistent anxiety  Engaging catastrphic thinking  Unable to connect to good things about self  Unable to connect to good things about others  Think in all or nothing term (all bad, no good)  Working on disputing negative thought patterns      PLAN  Continued Psychology services are recommended to address: Continue to follow  patient as needed for emotional support, assistance with coping skills.  Recommendations:  Weekly behavioral intervention    Signed by: Volanda Napoleon, Psy.D. 08/20/2019 11:03:00 AM

## 2019-08-22 ENCOUNTER — Other Ambulatory Visit
Admission: RE | Admit: 2019-08-22 | Discharge: 2019-08-22 | Disposition: A | Discharge status: Home / Self Care | Payer: Medicare Other | Source: Ambulatory Visit | Attending: Internal Medicine | Admitting: Internal Medicine

## 2019-08-22 ENCOUNTER — Other Ambulatory Visit: Payer: Self-pay

## 2019-08-22 DIAGNOSIS — I482 Chronic atrial fibrillation, unspecified: Secondary | ICD-10-CM

## 2019-08-22 LAB — PT/INR
PT INR: 2.3 — ABNORMAL HIGH (ref 0.9–1.1)
PT: 25.7 s — ABNORMAL HIGH (ref 12.6–15.0)

## 2019-08-26 ENCOUNTER — Ambulatory Visit (INDEPENDENT_AMBULATORY_CARE_PROVIDER_SITE_OTHER): Payer: Medicare Other

## 2019-08-26 DIAGNOSIS — I482 Chronic atrial fibrillation, unspecified: Secondary | ICD-10-CM

## 2019-08-26 NOTE — Rehab Evaluation ([facility]) (Signed)
NAMENICOS NICLEY  MRN: [id number]  Account: [id number]  Session Start: 08/20/2019 12:00:00 AM  Session Stop: 08/20/2019 12:00:00 AM    Total Treatment Minutes:  Minutes    Speech/Language Pathology  Outpatient Missed Visit Note    The patient did not attend the therapy appointment on 08/20/2019 at 1000.    Reason:  Scheduling conflict. Therapist out sick.  Future Appointments: The patient has additional appointments.    Signed by: Cannon Kettle, M.A., CCC/SLP 08/20/2019 10:00:00 AM

## 2019-08-26 NOTE — Rehab Evaluation ([facility]) (Signed)
NAMEJOHNDAVID BOLDON  MRN: [id number]  Account: [id number]  Session Start: 08/15/2019 12:00:00 AM  Session Stop: 08/15/2019 12:00:00 AM    Total Treatment Minutes:  Minutes    Speech/Language Pathology  Outpatient Missed Visit Note    The patient did not attend the therapy appointment on 08/15/2019 at 1100.    Reason:  Scheduling conflict. Therapist out sick.  Future Appointments: The patient has additional appointments.    Signed by: Cannon Kettle, M.A., CCC/SLP 08/15/2019 11:00:00 AM

## 2019-08-26 NOTE — Patient Instructions (Signed)
Spoke to pt who verbalized understanding of continuing normal dosing and retesting schedule

## 2019-08-27 ENCOUNTER — Encounter (INDEPENDENT_AMBULATORY_CARE_PROVIDER_SITE_OTHER): Payer: Self-pay | Admitting: Clinical Cardiac Electrophysiology

## 2019-08-27 ENCOUNTER — Inpatient Hospital Stay
Admission: RE | Admit: 2019-08-27 | Discharge: 2019-08-27 | Disposition: A | Discharge status: Home / Self Care | Payer: Medicare Other | Source: Ambulatory Visit

## 2019-08-27 LAB — PT/INR
PT INR: 2.6 — ABNORMAL HIGH (ref 0.9–1.1)
PT: 27.5 s — ABNORMAL HIGH (ref 12.6–15.0)

## 2019-08-27 NOTE — Psych (Signed)
NAMESTEPHAUN BORDERS  MRN: [id number]  Account: [id number]  Session Start: 08/27/2019 11:00:00 AM  Session Stop: 08/27/2019 11:53:00 AM    Total Treatment Minutes: 53.00 Minutes    Psychology Services  Outpatient Rehabilitation Progress Note    Rehab Diagnosis: TBI  Demographics:            Age: 51Y            Gender: Male    Medications and Allergies: Significant rehabilitation considerations:   NKA  Rehabilitation Precautions/Restrictions:   No rehabilitation precautions.    SUBJECTIVE  Patient Reports: Pt reported the following:  Feeling persistent anxiety  Feeling persistent fear of being punished  Feeling guilty  Holding a negative view of self and other    Suicide Risk Screen: Patient has these primary or secondary behavioral health  diagnoses or complaints: Describes self as mildly depressed. 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  Mood was depressed.  No evidence of  psychosis or delusions.  He explicitly denied SI and HI    Interventions:   NPSY INDIVID HLTH INTERV INT   NPSY INDIVID HLTH INTERV ADD    ASSESSMENT    Impressions:  Pt reported the following:  Feeling persistent anxiety  Feeling persistent fear of being punished  Feeling guilty  Holding a negative view of self and other      PLAN  Continued Psychology services are recommended to address: Continue to follow  patient as needed for emotional support, assistance with coping skills.  Recommendations:  Weekly behavioral intervention    Signed by: Volanda Napoleon, Psy.D. 08/27/2019 11:53:00 AM

## 2019-08-28 NOTE — Discharge Summary (Signed)
NAMESUSHANT [name]  MRN: [id number]  Account: [id number]  Session Start: 08/27/2019 10:00:00 AM  Session Stop: 08/27/2019 11:00:00 AM    Total Treatment Minutes: 60.00 Minutes    Speech/Language Pathology  Outpatient Discharge Summary    Medical Diagnosis: Rank Code      Description    Date of Onset    1    S06.2X1   Diffuse traumatic brain injury with loss of      05/15/2019                 consciousness of 30 minutes or less  1    S06.2X1   Diffuse traumatic brain injury with loss of      05/16/2019                 consciousness of 30 minutes or less  Therapy Diagnosis:  Rank Code      Description     Date of Onset    1    R41.840   Attention and concentration deficit              07/02/2019  2    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 I'm more depressed then ever. I mean  I'm still walking but other than that I'm just in bed."  Patient/Caregiver Goals: - I would like to be able to drive  - Be able to exercise more easily  - To cook again  - To read an article without losing focus at the end  Pain: Patient currently without complaints of pain.    OBJECTIVE  General Observation: Patient arrived to session on time unaccompanied. SLP and  patient remained masked for duration of session d/t COVID19 precautions. Of  note, patient appeared to be in distress throughout the session, spending most  of the time with his head in his hands and repeatedly stating "I'm just so  worried about everything." Patient made occasional comments such as "Sometimes I  wish I would die an unembarassing death but I think if I died right now  it would  make things worse for my wife in the short term." Patient scheduled with  neuropsychologist next hour; SLP immediately reported to provider patient's  comments and emotional state observed throughout this session.    Interventions:       Development of Cognitive Skills:  .       Counsellor of Cognitive Skills:  .    Session focus on education re: progress to date, discharge recommendations, home  exercise program, and review of trained cognitive strategies to support memory  and executive functions within the home environment. Patient independently able  to verbalize strategies trained during time in skilled SLP intervention,  including use of written encoding strategies, visual organization system to  faciltiate  weekly planning and follow through with daily activities, goal  attainment scaling, and goal-plan-do-review; patient has written resources to  access for all cognitive aids/strategies. Patient required frequent redirection  from internal distractors as listed above to engage in education with SLP.    Patient participated in discharge testing via re-administration of the Hopkins  Verbal Learning Test - Revised to objectively memory gains in memory encoding  and retrieval as well as executive functions. See score from initial evaluation  below for comparision. Appreciate gains in patient's delayed recall and  retention.    Hopkins Verbal Learning Test ? Revised (HVLT-R, Form 4; Appendix: A-10 )  Total Recall: 24 (T=44 ; 30th %ile)  Delayed Recall: 8 (T=42 ; 21st %ile)  Retention (%): 80% (T=42 ; 21st %ile)  Recognition Discrimination Index: 11 (T=52 ; 55th%ile)  ---------  Initial Evaluation, 07/02/2019  Hopkins Verbal Learning Test ? Revised (HVLT-R, Form 2; Appendix: A-10 )  Total Recall: 26 (T=49 ; 47th %ile)  Delayed Recall: 6 (T=32 ; 4th %ile)  Retention (%): 66% (T=32 ; 4th %ile)  Recognition Discrimination Index: 11 (T=52 ; 55th%ile)    Pain Reassessment: Pain was not reassessed as no pain  was reported.  Equipment Provided/Recommended: The patient has been given voluntary selection  of a Insurance account manager provider as per Fre[employer]. Arrangements have been made  with a company of the patient's choosing. The patient has been educated that  choosing a company outside of the insurance company's preferred provider may  affect their insurance coverage.  The following equipment was issued/recommended: visual organization system  (weekly planner); application based home exercise program for cognitive  retraining  Education:    Education Provided: Discharge recomendations. Progress to date. Home exercise  program. Cognitive strategies/aids. Brain injury recovery. .       Audience: Patient.       Mode: Explanation.         Response: Verbalized understanding.    ASSESSMENT  Clinical Status Changes: Patient has not experienced significant, unusual or  unexpected change in clinical status since their last visit.    Activity/Participation Problem List and Goals: Other  Goal: .  Goal Status: Unresolved -  Status:  LTG 1 (10 sessions from 08/08/2019): Patient will use external systems to  independently recall 90% of novel detailed information (eg., medical  recommendations, exercise precautions, recall steps to complex projects/plans).  PROGRESS REALIZED - At time of last progress note, patient required initial  instruction and minimal verbal cues to successfully trained external systems of  written notes and visual organization system (weekly planner) to plan and follow  through with routine daily activities.    Functions/Structures Problem List and Goals:  Other: Marland Kitchen  Goal Status: Unresolved  Status:  LTG 2 (10 sessions from 08/08/2019): Patient will use 1-2 trained graphic  organizers (eg., goal attainment scaling, goal-plan-do-review) to identify and  breakdown complex functional tasks/problems independently over  3 sessions.  PROGRESS REALIZED - At time of last progress note, patient required initial  instruction  and minimal verbal cues for specificity and task breakdown while  completing graphic organizers for GOAL-PLAN-DO-REVIEW to work through complex  problems (eg., tax returns) and for setting objective goals via goal attainment  scaling to track progress throughout brain injury recovery for specific  patient-identified areas (eg., walking increased distance within specific time  frame, reduce amount of time in bed).      Progress Summary: While patient was planned to be seen for 8 additional sessions  within  this plan of care, due to the reallocation of therapy staff to respond to  the acute care/critical care hospital inpatient needs created by the ongoing  COVID-19 pandemic and in response to patient's more severe/significant  psychological deficits, Mr. House will be discharged from skilled SLP  intervention at this times. Patient's overall progress is currently severely  limited by his clinically diagnosed depression, which appears to have  exacerbated over recent weeks. Given the severity of patient's underlying  emotional stressors, he is no longer able to engage in his routine daily  activities and states he now primarily spends his days in bed.  Patient requires  focused neuropyscholgical intervention at this time to support his emotional  well being and manage his depression prior to engaging in any further  cognitive-communication therapy.  The patient was given a Home Exercise Program  (HEP) and was encouraged to follow up with his primary care physician upon  management of underlying emotional stressors prior to returning to outpatient  SLP for re-evaluation.      Recommendations: Discharge from skilled SLP intervention at this time. Ongoing  treatment with neuropyschology and other related mental health providers (ie.,  psychiatry) to manage underlying emotional disturbances contributing to  patient's performance and well-being s/p TBI. Should patient manage his known  anxiety/depression and continue to  exhibit breakdowns in daily acitivities d/t  residual memory and executive function deficits, it is recommended he speak with  his primary care physician re: obtaining a referral to return to outpatient SLP  for re-evaluation. Plan discussed with patient who is in agreement to discharge  at this time.    Signed by: Cannon Kettle, M.A., CCC/SLP 08/27/2019 11:00:00 AM

## 2019-08-29 NOTE — Discharge Summary (Signed)
NAMEPRIDE NOVO  MRN: [id number]  Account: [id number]  Session Start: 08/20/2019 9:00:00 AM  Session Stop: 08/20/2019 9:54:00 AM    Total Treatment Minutes: 54.00 Minutes    Occupational Therapy  Discharge Summary    Medical Diagnosis: Rank Code      Description    Date of Onset    1    S06.2X1   Diffuse traumatic brain injury with loss of      05/15/2019                 consciousness of 30 minutes or less  1    S06.2X1   Diffuse traumatic brain injury with loss of      05/16/2019                 consciousness of 30 minutes or less  Therapy Diagnosis:  Rank Code      Description     Date of Onset    1    R27.8     Other lack of coordination                       05/16/2019  2    R41.844   Frontal lobe and executive function deficit      05/16/2019  3    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 I couldn't sleep last night. I feel like I'm  about to lose it  Patient/Caregiver Goals: 1. return to driving    2. improve safety and endurance during exercise    3. improve memory  4. return to cooking  Pain: Patient currently without complaints of pain.    OBJECTIVE  General Observation: pt received on time in lobby. +facemask and CLs. Indep for  mobility w/o AD. Anxious and rapid rate of breathing. Rates self as 9/10 anxious      Interventions:       Self Care/Home Management:  pt seen for d/c summary and reivew of progress.    Pt noticably anxious and internally distracted. Tearful. Stating he is concerned  about an errand wife will be completing. Review of coping strategies including  progressive relaxation. OT guides self breathing and biofeedback strategies,  though no improvements seen. Review  problem solving w/ pt demo'ing good  self-initiated safety management and OT unable to provide additional  strategies/problem solving to improve anxiousness.  Attempt to re-engage pt w/ table top scanning task for distraction. Pt able to  read article, summarize appropriatly, and locate 100% of designated words w/ one  break for breathing/self calming strategies.  Handoff to NPSY re: concerns, performance, and barriers.  Pain Reassessment: Pain was not reassessed as no pain was reported.  Education:    Education Provided: community resources, mood, progress .       Audience: Patient.       Mode: Explanation.  Teacher, English as a foreign language provided.       Response: No evidence of learning.    ASSESSMENT  Clinical Status Changes: Patient has not  experienced significant, unusual or  unexpected change in clinical status since their last visit.    Activity/Participation Problem List and Goals:  Other  Goal: LTG: (20 visits from 06/14/19): Pt will be grossly mod I for a.m. routine  including meal prep, bathing, and dressing in order to decrease burden of care  and return to meaningful roles/occupations.  GOAL MET.  .  STG: (10 visits from 06/14/19): Pt will complete internet scavenger hunt with  100% accuracy in order to demonstrate improving ability to navigate the web and  opperate computer as needed to communicate electronically with loved ones and  medical providers. GOAL MET  .  UPDATED GOALS  .  N/A  .  Goal Status: Resolved -  Status: pt has met all goals  Functions/Structures Problem List and Goals:  Other: LTG: (20 visits from 06/14/19): Pt will tolerate 40 minutes of continuous  reading without reporting significant occular or cognitive fatiguein order to  return to leisure reading and ability to review medical appointments. GOAL IN  PROGRESS/LARGELY MET -- INCREASED  .  STG: (10 visits from 06/14/19): Pt will demonstrate improved frusteration  tolerance and healthy coping mechanisms/self pacing strategies as measured  by  completing a challenging visual perceptual/cognitive task (e.g. [place]u, ipad  activity, etc) as self rated with no more than 2/10 frustration over a 50 min  time span. GOAL INCONSISTENTLY MET  .  UPDATED GOALS  .  LTG: (5 visits from 07/18/19): Pt will tolerate 55 minutes of continuous reading  without reporting significant occular or cognitive fatiguein order to return to  leisure reading and ability to review medical appointments. GOAL MET  .  Goal Status: Resolved  Status: Goals met  .    Progress Summary: '[name]' has met all OT goals. While he has progressed in  regards to visual endurance, GMC/FMC, and general frustration tolerance he  continues to be highly critical of self and view progress negative. He continues  to express he feels he is regressing. It is this OT's belief that mood and  apparent depression/anxiety is his only limiting factor from an OT scope at this  point. Pt reports he spend all day in bed, is anxious, and has intrusive  thoughts; though has denied SI/HI. He has been provided with [facility] resources for  psychiatric assistance; and is under the care of a neuropsychologist. At this  time he is physically able to complete all I/ADLs though is limited by internal  motivation and lethargy. He has been advised that, while he has achieved passing  scores on all pre-driving assessments and has been medically cleared, he should  not drive if he has concerns he is unsafe or is unable to maintain concentration  on road due to internal distractors. While he has verbalized understanding he  demonstrates poor understanding of education and goals/progress.  Equipment Provided/Recommended: No equipment was issued or recommended.  Recommendations: The patient has been discharged from Occupational Therapy  services secondary to: No need for skilled therapy intervention at this time.    Signed by: Particia Nearing, OTR/L 08/20/2019 10:00:00 AM

## 2019-09-02 ENCOUNTER — Ambulatory Visit: Payer: Medicare Other | Attending: Specialist

## 2019-09-02 DIAGNOSIS — R41840 Attention and concentration deficit: Secondary | ICD-10-CM | POA: Insufficient documentation

## 2019-09-02 DIAGNOSIS — R411 Anterograde amnesia: Secondary | ICD-10-CM | POA: Insufficient documentation

## 2019-09-02 DIAGNOSIS — R41844 Frontal lobe and executive function deficit: Secondary | ICD-10-CM | POA: Insufficient documentation

## 2019-09-02 DIAGNOSIS — R278 Other lack of coordination: Secondary | ICD-10-CM | POA: Insufficient documentation

## 2019-09-02 DIAGNOSIS — H5330 Unspecified disorder of binocular vision: Secondary | ICD-10-CM | POA: Insufficient documentation

## 2019-09-05 ENCOUNTER — Inpatient Hospital Stay
Admission: RE | Admit: 2019-09-05 | Discharge: 2019-09-05 | Disposition: A | Discharge status: Home / Self Care | Payer: Medicare Other | Source: Ambulatory Visit | Attending: Specialist | Admitting: Specialist

## 2019-09-05 DIAGNOSIS — I482 Chronic atrial fibrillation, unspecified: Secondary | ICD-10-CM | POA: Insufficient documentation

## 2019-09-05 LAB — PT/INR
PT INR: 2.8 — ABNORMAL HIGH (ref 0.9–1.1)
PT: 29.9 s — ABNORMAL HIGH (ref 12.6–15.0)

## 2019-09-05 NOTE — Psych (Signed)
NAMESATURNINO CURLING  MRN: [id number]  Account: [id number]  Session Start: 09/05/2019 12:00:00 PM  Session Stop: 09/05/2019 12:53:00 PM    Total Treatment Minutes: 53.00 Minutes    Psychology Services  Outpatient Rehabilitation Progress Note    Rehab Diagnosis: TBI  Demographics:            Age: 73Y            Gender: Male    Medications and Allergies: Significant rehabilitation considerations:   NKA  Rehabilitation Precautions/Restrictions:   No rehabilitation precautions.    SUBJECTIVE  Patient Reports: Pt reported the following:  Feeling persistent anxiety  Feeling afraid of punishment  Feeling a need to be perfect    Suicide Risk Screen: Patient has these primary or secondary behavioral health  diagnoses or complaints: Describes self as mildly depressed. 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  Mood was depressed.  No evidence of  psychosis or delusions.  He explicitly denied SI and HI    Interventions:   NPSY INDIVID HLTH INTERV INT   NPSY INDIVID HLTH INTERV ADD    ASSESSMENT    Impressions:  Pt reported the following:  Feeling persistent anxiety  Feeling afraid of punishment  Feeling a need to be perfect      PLAN  Continued Psychology services are recommended to address: Continue to follow  patient as needed for emotional support, assistance with coping skills.  Recommendations:  Weekly behavioral intervention    Signed by: Volanda Napoleon, Psy.D. 09/05/2019 12:53:00 PM

## 2019-09-09 ENCOUNTER — Encounter (INDEPENDENT_AMBULATORY_CARE_PROVIDER_SITE_OTHER): Payer: Self-pay

## 2019-09-11 NOTE — Psych (Signed)
NAMEDESHANNON DEMARCE  MRN: [id number]  Account: [id number]  Session Start: 09/10/2019 11:00:00 AM  Session Stop: 09/10/2019 11:53:00 AM    Total Treatment Minutes: 53.00 Minutes    Psychology Services  Outpatient Rehabilitation Progress Note    Rehab Diagnosis: TBI  Demographics:            Age: 39Y            Gender: Male    Medications and Allergies: Significant rehabilitation considerations:   NKA  Rehabilitation Precautions/Restrictions:   No rehabilitation precautions.    SUBJECTIVE  Patient Reports: Pt reported the following:  Feeling persistent anxiety  Feeling panic  Feeling like he is worse  Staying in bed for long hours  Agreed to walk more and be more active    Suicide Risk Screen: Patient has these primary or secondary behavioral health  diagnoses or complaints: Describes self as mildly depressed. 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  Mood was depressed.  No evidence of  psychosis or delusions.  He explicitly denied SI and HI      Interventions:   NPSY INDIVID HLTH INTERV INT   NPSY INDIVID HLTH INTERV ADD    ASSESSMENT    Impressions:  Pt reported the following:  Feeling persistent anxiety  Feeling panic  Feeling like he is worse  Staying in bed for long hours  Agreed to walk more and be more active      PLAN  Continued Psychology services are recommended to address: Continue to follow  patient as needed for emotional support, assistance with coping skills.  Recommendations:  Weekly behavioral intervention    Signed by: Volanda Napoleon, Psy.D. 09/10/2019 11:53:00 AM

## 2019-09-12 ENCOUNTER — Inpatient Hospital Stay
Admission: RE | Admit: 2019-09-12 | Discharge: 2019-09-12 | Disposition: A | Discharge status: Home / Self Care | Payer: Medicare Other | Source: Ambulatory Visit

## 2019-09-12 LAB — PT/INR
PT INR: 2.7 — ABNORMAL HIGH (ref 0.9–1.1)
PT: 28.6 s — ABNORMAL HIGH (ref 12.6–15.0)

## 2019-09-12 NOTE — Psych (Signed)
Jonathan [name]  MRN: [id number]  Account: [id number]  Session Start: 09/12/2019 12:00:00 PM  Session Stop: 09/12/2019 12:53:00 PM    Total Treatment Minutes: 53.00 Minutes    Psychology Services  Outpatient Rehabilitation Progress Note    Rehab Diagnosis: TBI  Demographics:            Age: 47Y            Gender: Male    Medications and Allergies: Significant rehabilitation considerations:   NKA  Rehabilitation Precautions/Restrictions:   No rehabilitation precautions.    SUBJECTIVE  Patient Reports: Pt reported the following:  Feelnig depressed  Feeling low energy  Feeling self-critical  Staying in bed for long hours during the day    Suicide Risk Screen: Patient has these primary or secondary behavioral health  diagnoses or complaints: Describes self as mildly depressed. 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  Mood was depressed.  No evidence of  psychosis or delusions.  He explicitly denied SI and HI      Interventions:   NPSY INDIVID HLTH INTERV INT   NPSY INDIVID HLTH INTERV ADD    ASSESSMENT    Impressions:  Pt reported the following:  Feelnig depressed  Feeling low energy  Feeling self-critical  Staying in bed for long hours during the day      PLAN  Continued Psychology services are recommended to address: Continue to follow  patient as needed for emotional support, assistance with coping skills.  Recommendations:  Weekly behavioral intervention    Signed by: Volanda Napoleon, Psy.D. 09/12/2019 12:53:00 PM

## 2019-09-17 NOTE — Psych (Signed)
Jonathan [name]  MRN: [id number]  Account: [id number]  Session Start: 09/17/2019 11:00:00 AM  Session Stop: 09/17/2019 11:53:00 AM    Total Treatment Minutes: 53.00 Minutes    Psychology Services  Outpatient Rehabilitation Progress Note    Rehab Diagnosis: TBI  Demographics:            Age: 58Y            Gender: Male    Medications and Allergies: Significant rehabilitation considerations:   NKA  Rehabilitation Precautions/Restrictions:   No rehabilitation precautions.    SUBJECTIVE  Patient Reports: Pt reported the following:  Persistent guilt  Persistent self criticism  Staying in bed  Despair    Suicide Risk Screen: Patient has these primary or secondary behavioral health  diagnoses or complaints: Describes self as mildly depressed. 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  Mood was depressed.  No evidence of  psychosis or delusions.  He explicitly denied SI and HI    Interventions:   NPSY INDIVID HLTH INTERV INT   NPSY INDIVID HLTH INTERV ADD    ASSESSMENT    Impressions:  Pt reported the following:  Persistent guilt  Persistent self criticism  Staying in bed  Despair      PLAN  Continued Psychology services are recommended to address: Continue to follow  patient as needed for emotional support, assistance with coping skills.  Recommendations:  Weekly behavioral intervention    Signed by: Volanda Napoleon, Psy.D. 09/17/2019 11:53:00 AM

## 2019-09-19 NOTE — Psych (Signed)
Jonathan [name]  MRN: [id number]  Account: [id number]  Session Start: 09/19/2019 12:00:00 AM  Session Stop: 09/19/2019 12:00:00 AM    Total Treatment Minutes:  Minutes    Psychology Services  Outpatient Missed Visit Note    The patient did not attend the therapy appointment on 09/19/2019 at 10am.    Reason:  Pt was called.  He cancelled appointment due to poor weather  conditions.  I offered a tele-therapy session.  He declined and said he wasn't  comfortable talking virturally.  He said that his experience of telemedicine  with his PCP was negative.  I informed him of when his next scheduled  appointment was and he confirmed that he would be there.  Future Appointment: The patient has additional appointments.    Signed by: Volanda Napoleon, Psy.D. 09/19/2019 11:00:00 AM

## 2019-09-23 ENCOUNTER — Encounter (INDEPENDENT_AMBULATORY_CARE_PROVIDER_SITE_OTHER): Payer: Self-pay

## 2019-09-24 ENCOUNTER — Ambulatory Visit (INDEPENDENT_AMBULATORY_CARE_PROVIDER_SITE_OTHER): Payer: Medicare Other

## 2019-09-24 DIAGNOSIS — Z23 Encounter for immunization: Secondary | ICD-10-CM

## 2019-09-26 NOTE — Psych (Signed)
Jonathan [name]  MRN: [id number]  Account: [id number]  Session Start: 09/26/2019 9:00:00 AM  Session Stop: 09/26/2019 9:53:00 AM    Total Treatment Minutes: 53.00 Minutes    Psychology Services  Outpatient Rehabilitation Progress Note    Rehab Diagnosis: TBI  Demographics:            Age: 41Y            Gender: Male    Medications and Allergies: Significant rehabilitation considerations:   NKA  Rehabilitation Precautions/Restrictions:   No rehabilitation precautions.    SUBJECTIVE  Patient Reports: Pt reported the following:  Feeling depressed  Feeling self-criticism  Holding negative view of self and other  Fearing rejection    Suicide Risk Screen: Patient has these primary or secondary behavioral health  diagnoses or complaints: Describes self as mildly depressed. 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  Mood was depressed.  No evidence of  psychosis or delusions.  He explicitly denied SI and HI.  BDI score was 46/64      Interventions:   NPSY INDIVID HLTH INTERV INT   NPSY INDIVID HLTH INTERV ADD    ASSESSMENT    Impressions:  Pt reported the following:  Feeling depressed  Feeling self-criticism  Holding negative view of self and other  Fearing rejection    PLAN  Continued Psychology services are recommended to address: Continue to follow  patient as needed for emotional support, assistance with coping skills.  Recommendations:  Weekly behavioral intervention    Signed by: Volanda Napoleon, Psy.D. 09/26/2019 9:53:00 AM

## 2019-09-30 ENCOUNTER — Ambulatory Visit: Payer: Medicare Other | Attending: Specialist

## 2019-09-30 DIAGNOSIS — R411 Anterograde amnesia: Secondary | ICD-10-CM | POA: Insufficient documentation

## 2019-09-30 DIAGNOSIS — R41844 Frontal lobe and executive function deficit: Secondary | ICD-10-CM | POA: Insufficient documentation

## 2019-09-30 DIAGNOSIS — H5330 Unspecified disorder of binocular vision: Secondary | ICD-10-CM | POA: Insufficient documentation

## 2019-09-30 DIAGNOSIS — R41840 Attention and concentration deficit: Secondary | ICD-10-CM | POA: Insufficient documentation

## 2019-09-30 DIAGNOSIS — R278 Other lack of coordination: Secondary | ICD-10-CM | POA: Insufficient documentation

## 2019-10-01 ENCOUNTER — Inpatient Hospital Stay
Admission: RE | Admit: 2019-10-01 | Discharge: 2019-10-01 | Disposition: A | Discharge status: Home / Self Care | Payer: Medicare Other | Source: Ambulatory Visit | Attending: Specialist | Admitting: Specialist

## 2019-10-01 DIAGNOSIS — I482 Chronic atrial fibrillation, unspecified: Secondary | ICD-10-CM | POA: Insufficient documentation

## 2019-10-01 LAB — PT/INR
PT INR: 2.6 — ABNORMAL HIGH (ref 0.9–1.1)
PT: 27.5 s — ABNORMAL HIGH (ref 12.6–15.0)

## 2019-10-01 NOTE — Psych (Signed)
Jonathan [name]  MRN: [id number]  Account: [id number]  Session Start: 10/01/2019 11:00:00 AM  Session Stop: 10/01/2019 11:53:00 AM    Total Treatment Minutes: 53.00 Minutes    Psychology Services  Outpatient Rehabilitation Progress Note    Rehab Diagnosis: TBI  Demographics:            Age: 1Y            Gender: Male    Medications and Allergies: Significant rehabilitation considerations:   NKA  Rehabilitation Precautions/Restrictions:   No rehabilitation precautions.    SUBJECTIVE  Patient Reports: Pt reported the following:  Slightly less self-critical  Feeling slightly less depressed    Suicide Risk Screen: Patient has these primary or secondary behavioral health  diagnoses or complaints: Describes self as mildly depressed. 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  Mood was depressed.  No evidence of  psychosis or delusions.  He explicitly denied SI and HI.  BDI score was 46/64    Interventions:   NPSY INDIVID HLTH INTERV INT   NPSY INDIVID HLTH INTERV ADD    ASSESSMENT    Impressions:  Pt reported the following:  Slightly less self-critical  Feeling slightly less depressed      PLAN  Continued Psychology services are recommended to address: Continue to follow  patient as needed for emotional support, assistance with coping skills.  Recommendations:  Weekly behavioral intervention    Signed by: Volanda Napoleon, Psy.D. 10/01/2019 11:53:00 AM

## 2019-10-03 NOTE — Psych (Signed)
Jonathan [name]  MRN: [id number]  Account: [id number]  Session Start: 10/03/2019 10:00:00 AM  Session Stop: 10/03/2019 10:53:00 AM    Total Treatment Minutes: 53.00 Minutes    Psychology Services  Outpatient Rehabilitation Progress Note    Rehab Diagnosis: TBI  Demographics:            Age: 82Y            Gender: Male    Medications and Allergies: Significant rehabilitation considerations:   NKA  Rehabilitation Precautions/Restrictions:   No rehabilitation precautions.    SUBJECTIVE  Patient Reports: Pt reported the following:  Feeling less depressed  Feeling motivated to be more active  Feeling self-critical, but less intensly    Suicide Risk Screen: Patient has these primary or secondary behavioral health  diagnoses or complaints: Describes self as mildly depressed. Depression related  to medical situation Denied SI and hI    OBJECTIVE  General Observation: Arrived on time.  Mood was depressed.  No evidence of  psychosis or delusions.  He explicitly denied SI and HI.  BDI score was 46/64    Interventions:   NPSY INDIVID HLTH INTERV INT   NPSY INDIVID HLTH INTERV ADD    ASSESSMENT    Impressions:  Pt reported the following:  Feeling less depressed  Feeling motivated to be more active  Feeling self-critical, but less intensly      PLAN  Continued Psychology services are recommended to address: Continue to follow  patient as needed for emotional support, assistance with coping skills.  Recommendations:  Weekly behavioral intervntion    Signed by: Volanda Napoleon, Psy.D. 10/03/2019 10:53:00 AM

## 2019-10-08 ENCOUNTER — Encounter (INDEPENDENT_AMBULATORY_CARE_PROVIDER_SITE_OTHER): Payer: Self-pay

## 2019-10-09 NOTE — Psych (Signed)
Jonathan [name]  MRN: [id number]  Account: [id number]  Session Start: 10/08/2019 11:00:00 AM  Session Stop: 10/08/2019 11:53:00 AM    Total Treatment Minutes: 53.00 Minutes    Psychology Services  Outpatient Rehabilitation Progress Note    Rehab Diagnosis: TBI  Demographics:            Age: 62Y            Gender: Male    Medications and Allergies: Significant rehabilitation considerations:   NKA  Rehabilitation Precautions/Restrictions:   No rehabilitation precautions.    SUBJECTIVE  Patient Reports: Pt reported the following:  Feeling less depressed  More active  Exercising 5/5 days  Requesting PT again    Suicide Risk Screen: Patient has these primary or secondary behavioral health  diagnoses or complaints: Describes self as mildly depressed. Depression related  to medical situation Denied SI and hI    OBJECTIVE  General Observation: Arrived on time.  Mood was less depressed.  No evidence of  psychosis or delusions.  He explicitly denied SI and HI.  BDI score was by  history was 46/64    Interventions:   NPSY INDIVID HLTH INTERV INT   NPSY INDIVID HLTH INTERV ADD    ASSESSMENT    Impressions:  Pt reported the following:  Feeling less depressed  More active  Exercising 5/5 days  Requesting PT again      PLAN  Continued Psychology services are recommended to address: Continue to follow  patient as needed for emotional support, assistance with coping skills.  Recommendations:  Weekly behavioral intervention    Signed by: Volanda Napoleon, Psy.D. 10/08/2019 11:53:00 AM

## 2019-10-15 ENCOUNTER — Encounter (INDEPENDENT_AMBULATORY_CARE_PROVIDER_SITE_OTHER): Payer: Self-pay

## 2019-10-16 ENCOUNTER — Encounter (INDEPENDENT_AMBULATORY_CARE_PROVIDER_SITE_OTHER): Payer: Self-pay | Admitting: Clinical Cardiac Electrophysiology

## 2019-10-16 ENCOUNTER — Ambulatory Visit (INDEPENDENT_AMBULATORY_CARE_PROVIDER_SITE_OTHER): Payer: Medicare Other

## 2019-10-16 ENCOUNTER — Ambulatory Visit (INDEPENDENT_AMBULATORY_CARE_PROVIDER_SITE_OTHER): Payer: Medicare Other | Admitting: Clinical Cardiac Electrophysiology

## 2019-10-16 VITALS — BP 120/81 | HR 60 | Wt 188.0 lb

## 2019-10-16 DIAGNOSIS — Z23 Encounter for immunization: Secondary | ICD-10-CM

## 2019-10-16 DIAGNOSIS — Z950 Presence of cardiac pacemaker: Secondary | ICD-10-CM

## 2019-10-16 DIAGNOSIS — Z7901 Long term (current) use of anticoagulants: Secondary | ICD-10-CM

## 2019-10-16 DIAGNOSIS — I482 Chronic atrial fibrillation, unspecified: Secondary | ICD-10-CM

## 2019-10-16 DIAGNOSIS — Z8673 Personal history of transient ischemic attack (TIA), and cerebral infarction without residual deficits: Secondary | ICD-10-CM

## 2019-10-16 DIAGNOSIS — I10 Essential (primary) hypertension: Secondary | ICD-10-CM
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
## 2019-10-16 NOTE — Psych (Signed)
NAMEBARUCH [name]  MRN: [id number]  Account: [id number]  Session Start: 10/15/2019 11:00:00 AM  Session Stop: 10/15/2019 11:53:00 AM    Total Treatment Minutes: 53.00 Minutes    Psychology Services  Outpatient Rehabilitation Progress Note    Rehab Diagnosis: TBI  Demographics:            Age: 68Y            Gender: Male    Medications and Allergies: Significant rehabilitation considerations:   NKA  Rehabilitation Precautions/Restrictions:   No rehabilitation precautions.    SUBJECTIVE  Patient Reports: Pt reported the following:  Fell while on a walk outside 4 days ago  Feeling depressed since then  Feeling discouraged  Feeling self-critical  Feeling embarrased about falling    Suicide Risk Screen: Patient has these primary or secondary behavioral health  diagnoses or complaints: Describes self as mildly depressed. 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  Mood was depressed.  No evidence of  psychosis or delusions.  He explicitly denied SI and HI.  BDI score was by  history was 46/64    Interventions:   NPSY INDIVID HLTH INTERV INT   NPSY INDIVID HLTH INTERV ADD    ASSESSMENT    Impressions:  Pt reported the following:  Fell while on a walk outside 4 days ago  Feeling depressed since then  Feeling discouraged  Feeling self-critical  Feeling embarrased about falling    PLAN  Continued Psychology services are recommended to address: Continue to follow  patient as needed for emotional support, assistance with coping skills.  Recommendations:  Weekly behavioral intervention    Signed by: Volanda Napoleon, Psy.D. 10/15/2019 11:53:00 AM

## 2019-10-17 ENCOUNTER — Inpatient Hospital Stay
Admission: RE | Admit: 2019-10-17 | Discharge: 2019-10-17 | Disposition: A | Discharge status: Home / Self Care | Payer: Medicare Other | Source: Ambulatory Visit

## 2019-10-17 LAB — PT/INR
PT INR: 1.6 — ABNORMAL HIGH (ref 0.9–1.1)
PT: 19.2 s — ABNORMAL HIGH (ref 12.6–15.0)

## 2019-10-17 NOTE — Psych (Signed)
Jonathan [name]  MRN: [id number]  Account: [id number]  Session Start: 10/17/2019 10:00:00 AM  Session Stop: 10/17/2019 10:53:00 AM    Total Treatment Minutes: 53.00 Minutes    Psychology Services  Outpatient Rehabilitation Progress Note    Rehab Diagnosis: TBI  Demographics:            Age: 24Y            Gender: Male    Medications and Allergies: Significant rehabilitation considerations:   NKA  Rehabilitation Precautions/Restrictions:   No rehabilitation precautions.    SUBJECTIVE  Patient Reports: Pt reported the following:  Feeling less depressed  Feeling self-critical  Feeling anxious about change  Feeling self-doubt    Suicide Risk Screen: Patient has these primary or secondary behavioral health  diagnoses or complaints: Describes self as mildly depressed. Depression related  to medical situation Denied SI and IH    OBJECTIVE  General Observation: Arrived on time.  Mood was depressed.  No evidence of  psychosis or delusions.  He explicitly denied SI and HI.  BDI score was by  history was 46/64      Interventions:   NPSY INDIVID HLTH INTERV INT   NPSY INDIVID HLTH INTERV ADD    ASSESSMENT    Impressions:  Pt reported the following:  Feeling less depressed  Feeling self-critical  Feeling anxious about change  Feeling self-doubt      PLAN  Continued Psychology services are recommended to address: Continue to follow  patient as needed for emotional support, assistance with coping skills.  Recommendations:  Weekly behavioral intervention    Signed by: Volanda Napoleon, Psy.D. 10/17/2019 10:53:00 AM

## 2019-10-23 NOTE — Psych (Signed)
NAMEKANAI [name]  MRN: [id number]  Account: [id number]  Session Start: 10/22/2019 11:00:00 AM  Session Stop: 10/22/2019 11:53:00 AM    Total Treatment Minutes: 53.00 Minutes    Psychology Services  Outpatient Rehabilitation Progress Note    Rehab Diagnosis: TBI  Demographics:            Age: 84Y            Gender: Male    Medications and Allergies: Significant rehabilitation considerations:   NKA  Rehabilitation Precautions/Restrictions:   No rehabilitation precautions.    SUBJECTIVE  Patient Reports: Pt reported the following:  Feeling a little less depressed  Feeling encouraged about starting PT    Suicide Risk Screen: Patient has these primary or secondary behavioral health  diagnoses or complaints: Describes self as mildly depressed. 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  Mood was depressed.  No evidence of  psychosis or delusions.  He explicitly denied SI and HI.  BDI score was by  history was 46/64      Interventions:   NPSY INDIVID HLTH INTERV INT   NPSY INDIVID HLTH INTERV ADD    ASSESSMENT    Impressions:  Pt reported the following:  Feeling a little less depressed  Feeling encouraged about starting PT      PLAN  Continued Psychology services are recommended to address: Continue to follow  patient as needed for emotional support, assistance with coping skills.  Recommendations:  Weekly behavioral intervention    Signed by: Volanda Napoleon, Psy.D. 10/22/2019 11:53:00 AM
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
## 2019-10-24 NOTE — Psych (Signed)
[name]  MRN: [id number]  Account: [id number]  Session Start: 10/24/2019 10:00:00 AM  Session Stop: 10/24/2019 10:53:00 AM    Total Treatment Minutes: 53.00 Minutes    Psychology Services  Outpatient Rehabilitation Progress Note    Rehab Diagnosis: TBI  Demographics:            Age: 42Y            Gender: Male    Medications and Allergies: Significant rehabilitation considerations:   NKA  Rehabilitation Precautions/Restrictions:   No rehabilitation precautions.    SUBJECTIVE  Patient Reports: Pt reported the following:  Feeling depressed  Feeling worried about emotional breakdown  Remembering the breakdown he had in college    Suicide Risk Screen: Patient has these primary or secondary behavioral health  diagnoses or complaints: Describes self as mildly depressed. 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  Mood was depressed.  No evidence of  psychosis or delusions.  He explicitly denied SI and HI.  BDI score was by  history was 46/64      Interventions:   NPSY INDIVID HLTH INTERV INT   NPSY INDIVID HLTH INTERV ADD    ASSESSMENT    Impressions:  Pt reported the following:  Feeling depressed  Feeling worried about emotional breakdown  Remembering the breakdown he had in college    PLAN  Continued Psychology services are recommended to address: Continue to follow  patient as needed for emotional support, assistance with coping skills.  Recommendations:  Weekly behavioral intervention    Signed by: Volanda Napoleon, Psy.D. 10/24/2019 10:53:00 AM

## 2019-10-30 NOTE — Psych (Signed)
NAMENISSAN [name]  MRN: [id number]  Account: [id number]  Session Start: 10/29/2019 11:00:00 AM  Session Stop: 10/29/2019 11:53:00 AM    Total Treatment Minutes: 53.00 Minutes    Psychology Services  Outpatient Rehabilitation Progress Note    Rehab Diagnosis: TBI  Demographics:            Age: 31Y            Gender: Male    Medications and Allergies: Significant rehabilitation considerations:   NKA  Rehabilitation Precautions/Restrictions:   No rehabilitation precautions.    SUBJECTIVE  Patient Reports: Pt reported the following:  Feeling depressed  Feeling self-critical  Feeling worried about having a mental breakdown    Suicide Risk Screen: Patient has these primary or secondary behavioral health  diagnoses or complaints: Describes self as mildly depressed. Depression related  to medical situaton Denied SI and HI    OBJECTIVE  General Observation: Arrived on time.  Mood was depressed.  No evidence of  psychosis or delusions.  He explicitly denied SI and HI.  BDI score was by  history was 46/64    Interventions:   NPSY INDIVID HLTH INTERV INT   NPSY INDIVID HLTH INTERV ADD    ASSESSMENT    Impressions:  Pt reported the following:  Feeling depressed  Feeling self-critical  Feeling worried about having a mental breakdown      PLAN  Continued Psychology services are recommended to address: Continue to follow  patient as needed for emotional support, assistance with coping skills.  Recommendations:  Weekly behavioral intervention    Signed by: Volanda Napoleon, Psy.D. 10/29/2019 11:53:00 AM

## 2019-10-31 ENCOUNTER — Inpatient Hospital Stay
Admission: RE | Admit: 2019-10-31 | Discharge: 2019-10-31 | Disposition: A | Discharge status: Home / Self Care | Payer: Medicare Other | Source: Ambulatory Visit | Attending: Specialist | Admitting: Specialist

## 2019-10-31 ENCOUNTER — Ambulatory Visit: Payer: Medicare Other | Attending: Specialist

## 2019-10-31 ENCOUNTER — Ambulatory Visit
Admission: RE | Admit: 2019-10-31 | Discharge: 2019-10-31 | Disposition: A | Discharge status: Home / Self Care | Payer: Medicare Other | Source: Ambulatory Visit | Attending: Surgery | Admitting: Surgery

## 2019-10-31 DIAGNOSIS — I482 Chronic atrial fibrillation, unspecified: Secondary | ICD-10-CM | POA: Insufficient documentation

## 2019-10-31 DIAGNOSIS — H5330 Unspecified disorder of binocular vision: Secondary | ICD-10-CM | POA: Insufficient documentation

## 2019-10-31 DIAGNOSIS — R41844 Frontal lobe and executive function deficit: Secondary | ICD-10-CM | POA: Insufficient documentation

## 2019-10-31 DIAGNOSIS — R41840 Attention and concentration deficit: Secondary | ICD-10-CM | POA: Insufficient documentation

## 2019-10-31 DIAGNOSIS — R411 Anterograde amnesia: Secondary | ICD-10-CM | POA: Insufficient documentation

## 2019-10-31 DIAGNOSIS — R7309 Other abnormal glucose: Secondary | ICD-10-CM | POA: Insufficient documentation

## 2019-10-31 DIAGNOSIS — R278 Other lack of coordination: Secondary | ICD-10-CM | POA: Insufficient documentation

## 2019-10-31 LAB — COMPREHENSIVE METABOLIC PANEL
ALT: 20 U/L (ref 0–55)
AST (SGOT): 48 U/L — ABNORMAL HIGH (ref 5–34)
Albumin/Globulin Ratio: 1.3 (ref 0.9–2.2)
Albumin: 4.3 g/dL (ref 3.5–5.0)
Alkaline Phosphatase: 103 U/L (ref 38–106)
Anion Gap: 10 (ref 5.0–15.0)
BUN: 11 mg/dL (ref 9.0–28.0)
Bilirubin, Total: 0.9 mg/dL (ref 0.2–1.2)
CO2: 27 mEq/L (ref 21–29)
Calcium: 10.3 mg/dL — ABNORMAL HIGH (ref 7.9–10.2)
Chloride: 99 mEq/L — ABNORMAL LOW (ref 100–111)
Creatinine: 0.8 mg/dL (ref 0.5–1.5)
Globulin: 3.2 g/dL (ref 2.0–3.7)
Glucose: 95 mg/dL (ref 70–100)
Potassium: 4 mEq/L (ref 3.5–5.1)
Protein, Total: 7.5 g/dL (ref 6.0–8.3)
Sodium: 136 mEq/L (ref 136–145)

## 2019-10-31 LAB — HEMOGLOBIN A1C
Average Estimated Glucose: 131.2 mg/dL
Hemoglobin A1C: 6.2 % — ABNORMAL HIGH (ref 4.6–5.9)

## 2019-10-31 LAB — HEMOLYSIS INDEX: Hemolysis Index: 5 (ref 0–18)

## 2019-10-31 LAB — PT/INR
PT INR: 1.8 — ABNORMAL HIGH (ref 0.9–1.1)
PT: 20.9 s — ABNORMAL HIGH (ref 10.1–12.9)

## 2019-10-31 LAB — GFR: EGFR: >60

## 2019-10-31 NOTE — Rehab Progress Note ([facility]) (Signed)
Jonathan [name]  MRN: [id number]  Account: [id number]  Session Start: 10/31/2019 4:02:00 PM  Session Stop: 10/31/2019 4:57:00 PM    Total Treatment Minutes: 55.00 Minutes    Physical Therapy  Outpatient Treatment Note    Medical Diagnosis: Rank Code      Description    Date of Onset    1    S06.2X1   Diffuse traumatic brain injury with loss of      05/15/2019                 consciousness of 30 minutes or less  1    S06.2X1   Diffuse traumatic brain injury with loss of      05/16/2019                 consciousness of 30 minutes or less  Therapy Diagnosis:  Rank Code      Description     Date of Onset    1    Z91.81    History of falling                               05/15/2019  2    R27.8     Other lack of coordination                       05/15/2019  3    R26.8     Other abnormalities of gait and mobility         05/15/2019  Demographics:            Age: 56Y            Gender: Male    Rehabilitation Precautions/Restrictions:   fall risk  pacemaker    SUBJECTIVE  Patient Report: "I went for a 3 kilometer walk this morning. It was slower than  I'd like."  Patient/Caregiver Goals: "To improve my balance and endurance."  " To get back to where I was before I discharged from PT."  Pain: Patient currently has pain.  Patient reports a pain level of 3 out of 10. Repositioned patient.    OBJECTIVE  General Observation: Pt received on-time in lobby. Pt ambulated back to gym  without AD, independently, slight shuffling gait pattern noted. Pt dons mask and  glasses for duration of session.  Other/Additional Findings: BP: 144/89    Interventions:       Therapeutic Activities:  .       Neuromuscular Reeducation:  .    Focus of session on improving LE strength and balance.    TA:  -5x sit<-->stand from standard chair: 34 seconds  -Mass practice stand pivot transfers to/from therapy mat without UE use    HEP:  -3x5 sit<-->stands without UE support  -3x10 standing mini squats  -sidestepping 3x10  -continue walking 2-3 km around  neighborhood      NMRE:  Standing balance:  -foam pad: feet apart: 30 seconds; eyes closed: 30 seconds  -foam pad: romberg stance: 30 seconds; eyes closed: up to 10 seconds  (noted posterior LOB)  -Standing balance on ramp with ankles in DF position: noted posterior LOB    Dynamic balance:  -side-stepping without UE support  -walking/stopping  -walking with 180 degree turn/stop  -walking with 180 degree turn/continue walking in opposite direction  -high marching    Pain Reassessment:  Response to Pain Intervention: 3/10 low back pain reported  Post  Intervention Pain Quality:  Aching. Tender.  Patient Reports Post Intervention Pain Level of: 3 out of 10  Pain Acceptable: Yes  Education:    Education Provided: Functional transfers. Fall prevention/balance training.  Safety. Gait. Home exercise/activity plan.       Audience: Patient.       Mode: Explanation.  Demonstration.       Response: Verbalized understanding.  Needs practice.  Needs reinforcement.    ASSESSMENT  Pt experienced multiple posterior losses of balance requiring small shuffling  steps to regain. He reports frustration with his performance during challenging  tasks and presents with negative self-talk consistently throughout session. Will  plan to re-assess tolerance for HEP next week. Plan for 5 additional outpatient  PT visits.    Activity/Participation Problem List and Goals: No updates at this time.  Functions/Structures Problem List and Goals: No updates at this time.    PLAN  Treatment Frequency, Duration, and Interventions: Continue Physical Therapy to  achieve goals per previously established Plan of Care.  Development of Plan of Care: There was no change to plan of care today. Patient  and/or family continue to be in agreement with plan of care.  The patient has been instructed to contact our clinic if any questions or  problems should arise.    Visit Number: Today's visit is number  2    Signed by: Bertis Ruddy, PT 10/31/2019 5:00:00 PM

## 2019-11-05 NOTE — Psych (Signed)
Jonathan [name]  MRN: [id number]  Account: [id number]  Session Start: 11/05/2019 11:00:00 AM  Session Stop: 11/05/2019 11:53:00 AM    Total Treatment Minutes: 53.00 Minutes    Psychology Services  Outpatient Rehabilitation Progress Note    Rehab Diagnosis: TBI  Demographics:            Age: 45Y            Gender: Male    Medications and Allergies: Significant rehabilitation considerations:   NKA  Rehabilitation Precautions/Restrictions:   No rehabilitation precautions.    SUBJECTIVE  Patient Reports: Pt reported the following:  Feeling self-critical  Feeling worried about making mistakes  Feeling self-doubt    Suicide Risk Screen: Patient has these primary or secondary behavioral health  diagnoses or complaints: Describes self as mildly depressed. 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  Mood was depressed.  No evidence of  psychosis or delusions.  He explicitly denied SI and HI.  BDI score was by  history was 46/64    Interventions:   NPSY INDIVID HLTH INTERV INT 30MIN   NPSY INDIVID HLTH INTERV ADD 15MIN    ASSESSMENT    Impressions:  Pt reported the following:  Feeling self-critical  Feeling worried about making mistakes  Feeling self-doubt      PLAN  Continued Psychology services are recommended to address: Continue to follow  patient as needed for emotional support, assistance with coping skills.  Recommendations:  Weekly behavioral intervention    Signed by: Kyra Searles, Psy.D. 11/05/2019 11:53:00 AM

## 2019-11-07 ENCOUNTER — Encounter (INDEPENDENT_AMBULATORY_CARE_PROVIDER_SITE_OTHER): Payer: Self-pay

## 2019-11-07 NOTE — Psych (Signed)
NAMERUEBEN KASSIM  MRN: [id number]  Account: [id number]  Session Start: 11/07/2019 10:00:00 AM  Session Stop: 11/07/2019 10:53:00 AM    Total Treatment Minutes: 53.00 Minutes    Psychology Services  Outpatient Rehabilitation Progress Note    Rehab Diagnosis: TBI  Demographics:            Age: 26Y            Gender: Male    Medications and Allergies: Significant rehabilitation considerations:   NKA  Rehabilitation Precautions/Restrictions:   No rehabilitation precautions.    SUBJECTIVE  Patient Reports: Pt reported the following:  Feeling self-critical  Feeling abandonment anxiety  Feeling shame    Suicide Risk Screen: Patient has these primary or secondary behavioral health  diagnoses or complaints: Describes self as mildly depressed. 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  Mood was depressed.  No evidence of  psychosis or delusions.  He explicitly denied SI and HI.  BDI score was by  history was 46/64      Interventions:   NPSY INDIVID HLTH INTERV INT   NPSY INDIVID HLTH INTERV ADD    ASSESSMENT    Impressions:  Pt reported the following:  Feeling self-critical  Feeling abandonment anxiety  Feeling shame    PLAN  Continued Psychology services are recommended to address: Continue to follow  patient as needed for emotional support, assistance with coping skills.  Recommendations:  Weekly behavioral intervention    Signed by: Volanda Napoleon, Psy.D. 11/07/2019 10:53:00 AM

## 2019-11-08 ENCOUNTER — Ambulatory Visit (INDEPENDENT_AMBULATORY_CARE_PROVIDER_SITE_OTHER): Payer: Medicare Other | Admitting: Cardiovascular Disease

## 2019-11-08 ENCOUNTER — Encounter (INDEPENDENT_AMBULATORY_CARE_PROVIDER_SITE_OTHER): Payer: Self-pay | Admitting: Cardiovascular Disease

## 2019-11-08 ENCOUNTER — Encounter (INDEPENDENT_AMBULATORY_CARE_PROVIDER_SITE_OTHER): Payer: Self-pay

## 2019-11-08 VITALS — BP 131/82 | HR 60 | Temp 97.4°F | Wt 198.0 lb

## 2019-11-08 DIAGNOSIS — Z8679 Personal history of other diseases of the circulatory system: Secondary | ICD-10-CM

## 2019-11-08 DIAGNOSIS — Z950 Presence of cardiac pacemaker: Secondary | ICD-10-CM

## 2019-11-08 DIAGNOSIS — I10 Essential (primary) hypertension: Secondary | ICD-10-CM

## 2019-11-08 DIAGNOSIS — I4891 Unspecified atrial fibrillation: Secondary | ICD-10-CM
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
## 2019-11-08 NOTE — Rehab Progress Note ([facility]) (Signed)
NAMECONSUELO [name]  MRN: [id number]  Account: [id number]  Session Start: 11/07/2019 11:03:00 AM  Session Stop: 11/07/2019 11:57:00 AM    Total Treatment Minutes: 54.00 Minutes    Physical Therapy  Outpatient Treatment Note    Medical Diagnosis: Rank Code      Description    Date of Onset    1    S06.2X1   Diffuse traumatic brain injury with loss of      05/15/2019                 consciousness of 30 minutes or less  1    S06.2X1   Diffuse traumatic brain injury with loss of      05/16/2019                 consciousness of 30 minutes or less  Therapy Diagnosis:  Rank Code      Description     Date of Onset    1    Z91.81    History of falling                               05/15/2019  2    R27.8     Other lack of coordination                       05/15/2019  3    R26.8     Other abnormalities of gait and mobility         05/15/2019  Demographics:            Age: 89Y            Gender: Male    Rehabilitation Precautions/Restrictions:   fall risk  pacemaker    SUBJECTIVE  Patient Report: "To be honest, I completely forgot about doing the strengthening  exercises. I did walk a 5K yesterday, though."  Patient/Caregiver Goals: "To improve my balance and endurance."  " To get back to where I was before I discharged from PT."  Pain: Patient currently without complaints of pain.    OBJECTIVE  General Observation: Pt received on-time in lobby. Pt ambulated back to gym  without AD, independently, slight shuffling gait pattern noted. Pt dons mask and  glasses for duration of session.  Other/Additional Findings: BP: 117/78  HR 60    Interventions:       Therapeutic Activities:  .    Focus of session on LE strengthening and improving standing balance/limiting  retropulsion with sit<-->stands.    TA:  -Initiated session with 12 minutes on upright bike at resistance level 5; pt  biked 2.25 miles; activity completed to improve LE strength, endurance, motor  control, and activity tolerance.    -Total Gym strengthening: seat position  17  BLE leg press: 2x10  single leg: 2x10    -Sit<-->stand re-training from therapy mat on foam pad:  Total of 50 repetitions completed with verbal and manual facilitation to promote  anterior weight-shifting prior to standing to prevent retropulsion; performance  improved when cued to extend arms while shifting fwd; retropulsion and posterior  LOB noted on 25% of trials    -Rockerboard with A/P weight-shifting: static stance, controlled  weight-shifting, standing squats with arms extended    Pain Reassessment: Pain was not reassessed as no pain was reported.  Education:    Education Provided: Precautions. Plan of care. Functional transfers. Fall  prevention/balance training. Safety. Gait.  Home exercise/activity plan.       Audience: Patient.       Mode: Explanation.  Demonstration.       Response: Verbalized understanding.  Needs practice.  Needs reinforcement.    ASSESSMENT  Pt reporting improving activity tolerance as he completed a 5K walk yesterday.  Continued reinforcement of importance of HEP compliance provided. With verbal  cuing and manual facilitation, performance for sit<-->stands improved as  retropulsion was minimized. Plan to continue next session with standing and  dynamic balance activities to decrease falls risk.    Activity/Participation Problem List and Goals: No updates at this time.  Functions/Structures Problem List and Goals: No updates at this time.    PLAN  Treatment Frequency, Duration, and Interventions: Continue Physical Therapy to  achieve goals per previously established Plan of Care.  Development of Plan of Care: There was no change to plan of care today. Patient  and/or family continue to be in agreement with plan of care.  The patient has been instructed to contact our clinic if any questions or  problems should arise.    Visit Number: Today's visit is number  3    Signed by: Bertis Ruddy, PT 11/07/2019 12:00:00 PM

## 2019-11-13 NOTE — Psych (Signed)
[name]  MRN: [id number]  Account: [id number]  Session Start: 11/12/2019 11:00:00 AM  Session Stop: 11/12/2019 11:53:00 AM    Total Treatment Minutes: 53.00 Minutes    Psychology Services  Outpatient Rehabilitation Progress Note    Rehab Diagnosis: TBI  Demographics:            Age: 43Y            Gender: Male    Medications and Allergies: Significant rehabilitation considerations:   NKA  Rehabilitation Precautions/Restrictions:   No rehabilitation precautions.    SUBJECTIVE  Patient Reports: Pt reported the following:  Feeling anxious about regressing in recovery  Feeling more active  Feeling self-critical  Feeling pessimistic    Suicide Risk Screen: Patient has these primary or secondary behavioral health  diagnoses or complaints: Describes self as mildly depressed. 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  Mood was depressed.  No evidence of  psychosis or delusions.  He explicitly denied SI and HI.  BDI score was by  history was 46/64      Interventions:   NPSY INDIVID HLTH INTERV INT   NPSY INDIVID HLTH INTERV ADD    ASSESSMENT    Impressions:  Pt reported the following:  Feeling anxious about regressing in recovery  Feeling more active  Feeling self-critical  Feeling pessimistic      PLAN  Continued Psychology services are recommended to address: Continue to follow  patient as needed for emotional support, assistance with coping skills.  Recommendations:  Weekly behavioral intervention    Signed by: Volanda Napoleon, Psy.D. 11/12/2019 11:53:00 AM

## 2019-11-13 NOTE — Rehab Progress Note ([facility]) (Signed)
Jonathan [name] [name]  MRN: [id number]  Account: [id number]  Session Start: 11/12/2019 10:03:00 AM  Session Stop: 11/12/2019 10:57:00 AM    Total Treatment Minutes: 54.00 Minutes    Physical Therapy  Outpatient Treatment Note    Medical Diagnosis: Rank Code      Description    Date of Onset    1    S06.2X1   Diffuse traumatic brain injury with loss of      05/15/2019                 consciousness of 30 minutes or less  1    S06.2X1   Diffuse traumatic brain injury with loss of      05/16/2019                 consciousness of 30 minutes or less  Therapy Diagnosis:  Rank Code      Description     Date of Onset    1    Z91.81    History of falling                               05/15/2019  2    R27.8     Other lack of coordination                       05/15/2019  3    R26.8     Other abnormalities of gait and mobility         05/15/2019  Demographics:            Age: 89Y            Gender: Male    Rehabilitation Precautions/Restrictions:   fall risk  pacemaker    SUBJECTIVE  Patient Report: "I walked for a 5K over the weekend. I did the exercises 2x  since Thursday and it's getting easier to stand up without using my hands."  Patient/Caregiver Goals: "To improve my balance and endurance."  " To get back to where I was before I discharged from PT."  Pain: Patient currently without complaints of pain.    OBJECTIVE  General Observation: Pt received on-time in lobby. Pt ambulated back to gym  without AD, independently, slight shuffling gait pattern noted. Pt dons mask and  glasses for duration of session.  Other/Additional Findings: BP: 98/59  HR: 60    Interventions:       Therapeutic Activities:  .       Neuromuscular Reeducation:  .    Focus of session on improving dynamic balance and LE strength:    TA:  -Initiated session with 12 minutes on upright bike at resistance level 5; pt  biked 2.65 miles; activity completed to improve LE strength, endurance, motor  control, and activity tolerance    NMRE:  Dynamic balance:  -BOSU  lunges without UE support; verbal cuing for technique/controlled  weight-shifting/slowing speed to improve performance  -Ball toss to self with fwd walk  -Newman Pies toss to self with bkwd walk  -Newman Pies toss to therapist with fwd walk  -Newman Pies toss to therapist with bkwd walk; cuing to increase step length  -Braiding with fwd/bkwd cross  -Verbal cuing for upright posture due to tendency of pt to ambulate with fwd  head/rounded shoulders/and excessive kyphosis. Pt reporting he has adapted to  this posture to prevent posterior LOB.    Pain Reassessment: Pain was not reassessed as  no pain was reported.  Education:    Education Provided: Precautions. Plan of care. Functional transfers. Fall  prevention/balance training. Gait.       Audience: Patient.       Mode: Explanation.  Demonstration.       Response: Verbalized understanding.  Needs practice.    ASSESSMENT  Improved in BLE strength/activity tolerance noted as pt was able to bike 0.40  miles farther this session while maintaining same time and resistance. Pt  completed session with fewer posterior LOB when completing sit<-->stands. He has  adapted a compensatory posture in order to prevent posterior LOB and would  benefit from postural re-training and update of HEP next session.    Activity/Participation Problem List and Goals: No updates at this time.  Functions/Structures Problem List and Goals: No updates at this time.    PLAN  Treatment Frequency, Duration, and Interventions: Continue Physical Therapy to  achieve goals per previously established Plan of Care.  Development of Plan of Care: There was no change to plan of care today. Patient  and/or family continue to be in agreement with plan of care.  The patient has been instructed to contact our clinic if any questions or  problems should arise.    Visit Number: Today's visit is number  4    Signed by: Bertis Ruddy, PT 11/12/2019 11:00:00 AM

## 2019-11-14 NOTE — Psych (Signed)
Jonathan [name]  MRN: [id number]  Account: [id number]  Session Start: 11/14/2019 10:00:00 AM  Session Stop: 11/14/2019 10:53:00 AM    Total Treatment Minutes: 53.00 Minutes    Psychology Services  Outpatient Rehabilitation Progress Note    Rehab Diagnosis: TBI  Demographics:            Age: 66Y            Gender: Male    Medications and Allergies: Significant rehabilitation considerations:   NKA  Rehabilitation Precautions/Restrictions:   No rehabilitation precautions.    SUBJECTIVE  Patient Reports: Pt reported the following:  Feeling anxious about suffering  Thinking in black and white terms  Feeling anxious about the future    Suicide Risk Screen: Patient has these primary or secondary behavioral health  diagnoses or complaints: Describes self as mildly depressed. 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  Mood was depressed.  No evidence of  psychosis or delusions.  He explicitly denied SI and HI.  BDI score was by  history was 46/64    Interventions:   NPSY INDIVID HLTH INTERV INT   NPSY INDIVID HLTH INTERV ADD    ASSESSMENT    Impressions:  Pt reported the following:  Feeling anxious about suffering  Thinking in black and white terms  Feeling anxious about the future      PLAN  Continued Psychology services are recommended to address: Continue to follow  patient as needed for emotional support, assistance with coping skills.  Recommendations:  Weekly behavioral intervention    Signed by: Volanda Napoleon, Psy.D. 11/14/2019 10:53:00 AM
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
## 2019-11-19 NOTE — Psych (Signed)
[name]  MRN: [id number]  Account: [id number]  Session Start: 11/19/2019 11:00:00 AM  Session Stop: 11/19/2019 11:53:00 AM    Total Treatment Minutes: 53.00 Minutes    Psychology Services  Outpatient Rehabilitation Progress Note    Rehab Diagnosis: TBI  Demographics:            Age: 74Y            Gender: Male    Medications and Allergies: Significant rehabilitation considerations:   NKA  Rehabilitation Precautions/Restrictions:   No rehabilitation precautions.    SUBJECTIVE  Patient Reports: Pt reported the following:  Feeling a sense of loss  Feeling a loss of youthfullness  Feeling more capable of trusting himself    Suicide Risk Screen: Patient has these primary or secondary behavioral health  diagnoses or complaints: Describes self as mildly depressed. 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  Mood was depressed.  No evidence of  psychosis or delusions.  He explicitly denied SI and HI.  BDI score was by  history was 46/64    Interventions:   NPSY INDIVID HLTH INTERV INT   NPSY INDIVID HLTH INTERV ADD    ASSESSMENT    Impressions:  Pt reported the following:  Feeling a sense of loss  Feeling a loss of youthfullness  Feeling more capable of trusting himself      PLAN  Continued Psychology services are recommended to address: Continue to follow  patient as needed for emotional support, assistance with coping skills.  Recommendations:  Weekly behavioral intervention    Signed by: Volanda Napoleon, Psy.D. 11/19/2019 11:53:00 AM

## 2019-11-21 ENCOUNTER — Inpatient Hospital Stay
Admission: RE | Admit: 2019-11-21 | Discharge: 2019-11-21 | Disposition: A | Discharge status: Home / Self Care | Payer: Medicare Other | Source: Ambulatory Visit

## 2019-11-21 LAB — PT/INR
PT INR: 2.8 — ABNORMAL HIGH (ref 0.9–1.1)
PT: 31.7 s — ABNORMAL HIGH (ref 10.1–12.9)

## 2019-11-26 NOTE — Psych (Signed)
Jonathan [name]  MRN: [id number]  Account: [id number]  Session Start: 11/26/2019 11:00:00 AM  Session Stop: 11/26/2019 11:53:00 AM    Total Treatment Minutes: 53.00 Minutes    Psychology Services  Outpatient Rehabilitation Progress Note    Rehab Diagnosis: TBI  Demographics:            Age: 17Y            Gender: Male    Medications and Allergies: Significant rehabilitation considerations:   NKA  Rehabilitation Precautions/Restrictions:   No rehabilitation precautions.    SUBJECTIVE  Patient Reports: Pt reported the following:  Feeling less depressed  Feeling low self-regard  Feeling anxious about the future    Suicide Risk Screen: Patient has these primary or secondary behavioral health  diagnoses or complaints: Describes self as mildly depressed. 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  Mood was depressed.  No evidence of  psychosis or delusions.  He explicitly denied SI and HI.  BDI score was by  history 46/64.  BDI on 11/26/2019 was 29.    Interventions:   NPSY INDIVID HLTH INTERV INT 30MIN   NPSY INDIVID HLTH INTERV ADD 15MIN    ASSESSMENT    Impressions:  Pt reported the following:  Feeling less depressed  Feeling low self-regard  Feeling anxious about the future    PLAN  Continued Psychology services are recommended to address: Continue to follow  patient as needed for emotional support, assistance with coping skills.  Recommendations:  Weekly behavioral intervention    Signed by: Kyra Searles, Psy.D. 11/26/2019 11:53:00 AM

## 2019-11-27 NOTE — Rehab Progress Note ([facility]) (Signed)
NAMETYMOTHY CASS  MRN: [id number]  Account: [id number]  Session Start: 11/26/2019 10:00:00 AM  Session Stop: 11/26/2019 10:00:00 AM    Total Treatment Minutes:  Minutes    Physical Therapy  Outpatient Note - Communication    Medical Diagnosis: Rank Code      Description    Date of Onset    1    S06.2X1   Diffuse traumatic brain injury with loss of      05/15/2019                 consciousness of 30 minutes or less  1    S06.2X1   Diffuse traumatic brain injury with loss of      05/16/2019                 consciousness of 30 minutes or less  Therapy Diagnosis:  Rank Code      Description     Date of Onset    1    Z91.81    History of falling                               05/15/2019  2    R27.8     Other lack of coordination                       05/15/2019  3    R26.8     Other abnormalities of gait and mobility         05/15/2019  Demographics:            Age: 27Y            Gender: Male    Spoke with patient on 4.27.21 at 12:00 pm. He was diagnosed with a stress  fracture in his right foot and is to wear a boot for ambulation. He will  follow-up with MD on 5.5.21. Will hold outpatient PT until pt is cleared from  wearing boot.    Signed by: Bertis Ruddy, PT 11/26/2019 11:00:00 AM

## 2019-11-28 NOTE — Psych (Signed)
Jonathan [name]  MRN: [id number]  Account: [id number]  Session Start: 11/28/2019 10:00:00 AM  Session Stop: 11/28/2019 10:53:00 AM    Total Treatment Minutes: 53.00 Minutes    Psychology Services  Outpatient Rehabilitation Progress Note    Rehab Diagnosis: TBI  Demographics:            Age: 53Y            Gender: Male    Medications and Allergies: Significant rehabilitation considerations:   NKA  Rehabilitation Precautions/Restrictions:   No rehabilitation precautions.    SUBJECTIVE  Patient Reports: Pt reported the following:  Feeling low self-regard  Feeling self-critical  Feeling more motivated  Swimming as a form of coping    Suicide Risk Screen: Patient has these primary or secondary behavioral health  diagnoses or complaints: Describes self as mildly depressed. 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  Mood was depressed.  No evidence of  psychosis or delusions.  He explicitly denied SI and HI.  BDI score was by  history 46/64.  BDI on 11/26/2019 was 29.    Interventions:   NPSY INDIVID HLTH INTERV INT   NPSY INDIVID HLTH INTERV ADD    ASSESSMENT    Impressions:  Pt reported the following:  Feeling low self-regard  Feeling self-critical  Feeling more motivated  Swimming as a form of coping      PLAN  Continued Psychology services are recommended to address: Continue to follow  patient as needed for emotional support, assistance with coping skills.  Recommendations:  Weekly behavioral intervention    Signed by: Volanda Napoleon, Psy.D. 11/28/2019 10:53:00 AM

## 2019-12-02 ENCOUNTER — Ambulatory Visit: Payer: Medicare Other | Attending: Specialist

## 2019-12-02 DIAGNOSIS — R278 Other lack of coordination: Secondary | ICD-10-CM | POA: Insufficient documentation

## 2019-12-02 DIAGNOSIS — H5330 Unspecified disorder of binocular vision: Secondary | ICD-10-CM | POA: Insufficient documentation

## 2019-12-02 DIAGNOSIS — R41840 Attention and concentration deficit: Secondary | ICD-10-CM | POA: Insufficient documentation

## 2019-12-02 DIAGNOSIS — R41844 Frontal lobe and executive function deficit: Secondary | ICD-10-CM | POA: Insufficient documentation

## 2019-12-02 DIAGNOSIS — R411 Anterograde amnesia: Secondary | ICD-10-CM | POA: Insufficient documentation

## 2019-12-03 ENCOUNTER — Inpatient Hospital Stay
Admission: RE | Admit: 2019-12-03 | Discharge: 2019-12-03 | Disposition: A | Discharge status: Home / Self Care | Payer: Medicare Other | Source: Ambulatory Visit | Attending: Specialist | Admitting: Specialist

## 2019-12-03 DIAGNOSIS — I482 Chronic atrial fibrillation, unspecified: Secondary | ICD-10-CM | POA: Insufficient documentation

## 2019-12-03 LAB — PT/INR
PT INR: 2 — ABNORMAL HIGH (ref 0.9–1.1)
PT: 22.4 s — ABNORMAL HIGH (ref 10.1–12.9)

## 2019-12-04 NOTE — Psych (Signed)
[name]  MRN: [id number]  Account: [id number]  Session Start: 12/03/2019 11:00:00 AM  Session Stop: 12/03/2019 11:53:00 AM    Total Treatment Minutes: 53.00 Minutes    Psychology Services  Outpatient Rehabilitation Progress Note    Rehab Diagnosis: TBI  Demographics:            Age: 2Y            Gender: Male    Medications and Allergies: Significant rehabilitation considerations:   NKA  Rehabilitation Precautions/Restrictions:   No rehabilitation precautions.    SUBJECTIVE  Patient Reports: Pt reported the following:  Feeling discouraged  Feeling self-critical  Feeling that he doesn't deserve anything good  Feeling depressed    Suicide Risk Screen: Patient has these primary or secondary behavioral health  diagnoses or complaints: Describes self as mildly depressed. Depression related  to medical siutation Denied Si and HI    OBJECTIVE  General Observation: Arrived on time.  Mood was depressed.  No evidence of  psychosis or delusions.  He explicitly denied SI and HI.  BDI score was by  history 46/64.  BDI on 11/26/2019 was 29.    Interventions:   NPSY INDIVID HLTH INTERV INT   NPSY INDIVID HLTH INTERV ADD    ASSESSMENT    Impressions:  Pt reported the following:  Feeling discouraged  Feeling self-critical  Feeling that he doesn't deserve anything good  Feeling depressed      PLAN  Continued Psychology services are recommended to address: Continue to follow  patient as needed for emotional support, assistance with coping skills.  Recommendations:  Weekly behavioral intervention    Signed by: Volanda Napoleon, Psy.D. 12/03/2019 11:53:00 AM

## 2019-12-07 ENCOUNTER — Encounter (INDEPENDENT_AMBULATORY_CARE_PROVIDER_SITE_OTHER): Payer: Self-pay

## 2019-12-08 ENCOUNTER — Encounter (INDEPENDENT_AMBULATORY_CARE_PROVIDER_SITE_OTHER): Payer: Self-pay

## 2019-12-11 NOTE — Psych (Signed)
Jonathan [name]  MRN: [id number]  Account: [id number]  Session Start: 12/10/2019 11:00:00 AM  Session Stop: 12/10/2019 11:53:00 AM    Total Treatment Minutes: 53.00 Minutes    Psychology Services  Outpatient Rehabilitation Progress Note    Rehab Diagnosis: TBI  Demographics:            Age: 63Y            Gender: Male    Medications and Allergies: Significant rehabilitation considerations:   NKA  Rehabilitation Precautions/Restrictions:   No rehabilitation precautions.    SUBJECTIVE  Patient Reports: Pt reported the following:  Feeling self-critical  Feeling perfectionism  Feeling anxiety about the future    Suicide Risk Screen: Patient has these primary or secondary behavioral health  diagnoses or complaints: Describes self as mildly depressed. 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  Mood was depressed.  No evidence of  psychosis or delusions.  He explicitly denied SI and HI.  BDI score was by  history 46/64.  BDI on 11/26/2019 was 29.  Interventions:   NPSY INDIVID HLTH INTERV INT   NPSY INDIVID HLTH INTERV ADD    ASSESSMENT    Impressions:  Pt reported the following:  Feeling self-critical  Feeling perfectionism  Feeling anxiety about the future      PLAN  Continued Psychology services are recommended to address: Continue to follow  patient as needed for emotional support, assistance with coping skills.  Recommendations:  Weekly behavioral intervention    Signed by: Volanda Napoleon, Psy.D. 12/10/2019 11:53:00 AM

## 2019-12-12 NOTE — Psych (Signed)
[name]  MRN: [id number]  Account: [id number]  Session Start: 12/12/2019 10:00:00 AM  Session Stop: 12/12/2019 10:53:00 AM    Total Treatment Minutes: 53.00 Minutes    Psychology Services  Outpatient Rehabilitation Progress Note    Rehab Diagnosis: TBI  Demographics:            Age: 7Y            Gender: Male    Medications and Allergies: Significant rehabilitation considerations:   NKA  Rehabilitation Precautions/Restrictions:   No rehabilitation precautions.    SUBJECTIVE  Patient Reports: Pt reported the following:  Feeling more depressed since stoping his anti-depressant medication  Agreed to call PCP to restart medication    Suicide Risk Screen: Patient has these primary or secondary behavioral health  diagnoses or complaints: Describes self as mildly depressed. 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  Mood was depressed.  No evidence of  psychosis or delusions.  He explicitly denied SI and HI.  BDI score was by  history 46/64.  BDI on 11/26/2019 was 29.    Interventions:   NPSY INDIVID HLTH INTERV INT   NPSY INDIVID HLTH INTERV ADD    ASSESSMENT    Impressions:  Pt reported the following:  Feeling more depressed since stoping his anti-depressant medication  Agreed to call PCP to restart medication    PLAN  Continued Psychology services are recommended to address: Continue to follow  patient as needed for emotional support, assistance with coping skills.  Recommendations:  Weekly behavioral intervention    Signed by: Volanda Napoleon, Psy.D. 12/12/2019 10:53:00 AM

## 2019-12-18 NOTE — Psych (Signed)
NAMEVUK SKILLERN  MRN: [id number]  Account: [id number]  Session Start: 12/17/2019 11:00:00 AM  Session Stop: 12/17/2019 11:53:00 AM    Total Treatment Minutes: 53.00 Minutes    Psychology Services  Outpatient Rehabilitation Progress Note    Rehab Diagnosis: TBI  Demographics:            Age: 73Y            Gender: Male    Medications and Allergies: Significant rehabilitation considerations:   NKA  Rehabilitation Precautions/Restrictions:   No rehabilitation precautions.    SUBJECTIVE  Patient Reports: Pt reported the following:  Feeling depressed  Feeling a loss of control  Feeling helpless  Feeling ashamed of needing help    Suicide Risk Screen: Patient has these primary or secondary behavioral health  diagnoses or complaints: Describes self as mildly depressed. 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  Mood was depressed.  No evidence of  psychosis or delusions.  He explicitly denied SI and HI.  BDI score was by  history 46/64.  BDI on 11/26/2019 was 29.    Interventions:   NPSY INDIVID HLTH INTERV INT   NPSY INDIVID HLTH INTERV ADD    ASSESSMENT    Impressions:  Pt reported the following:  Feeling depressed  Feeling a loss of control  Feeling helpless  Feeling ashamed of needing help    PLAN  Continued Psychology services are recommended to address: Continue to follow  patient as needed for emotional support, assistance with coping skills.  Recommendations:  Weekly behaivoral intervention    Signed by: Volanda Napoleon, Psy.D. 12/17/2019 11:53:00 AM

## 2019-12-19 NOTE — Psych (Signed)
NAMECOY ROCHFORD  MRN: [id number]  Account: [id number]  Session Start: 12/19/2019 10:00:00 AM  Session Stop: 12/19/2019 10:53:00 AM    Total Treatment Minutes: 53.00 Minutes    Psychology Services  Outpatient Rehabilitation Progress Note    Rehab Diagnosis: TBI  Demographics:            Age: 81Y            Gender: Male    Medications and Allergies: Significant rehabilitation considerations:   NKA  Rehabilitation Precautions/Restrictions:   No rehabilitation precautions.    SUBJECTIVE  Patient Reports: Pt reported the following:  Feeling depressed  Feeling guilty about needing help  Feeling self-critical    Suicide Risk Screen: Patient has these primary or secondary behavioral health  diagnoses or complaints: Describes self as mildly depressed. 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  Mood was depressed.  No evidence of  psychosis or delusions.  He explicitly denied SI and HI.  BDI score was by  history 46/64.  BDI on 11/26/2019 was 29.    Interventions:   NPSY INDIVID HLTH INTERV INT   NPSY INDIVID HLTH INTERV ADD    ASSESSMENT    Impressions:  Pt reported the following:  Feeling depressed  Feeling guilty about needing help  Feeling self-critical      PLAN  Continued Psychology services are recommended to address: Continue to follow  patient as needed for emotional support, assistance with coping skills.  Recommendations:  Weekly behavioral intervention    Signed by: Volanda Napoleon, Psy.D. 12/19/2019 10:53:00 AM

## 2019-12-24 ENCOUNTER — Inpatient Hospital Stay
Admission: RE | Admit: 2019-12-24 | Discharge: 2019-12-24 | Disposition: A | Discharge status: Home / Self Care | Payer: Medicare Other | Source: Ambulatory Visit

## 2019-12-24 LAB — PT/INR
PT INR: 3.9 — ABNORMAL HIGH (ref 0.9–1.1)
PT: 44 s — ABNORMAL HIGH (ref 10.1–12.9)

## 2019-12-25 NOTE — Psych (Signed)
Jonathan [name]  MRN: [id number]  Account: [id number]  Session Start: 12/24/2019 11:00:00 AM  Session Stop: 12/24/2019 11:53:00 AM    Total Treatment Minutes: 53.00 Minutes    Psychology Services  Outpatient Rehabilitation Progress Note    Rehab Diagnosis: TBI  Demographics:            Age: 29Y            Gender: Male    Medications and Allergies: Significant rehabilitation considerations:   NKA  Rehabilitation Precautions/Restrictions:   No rehabilitation precautions.    SUBJECTIVE  Patient Reports: Pt reported the following:  Feeling depressed  Feeling self-critical  Feeling anxious about needing help  Feeling socially withdrawn    Suicide Risk Screen: Patient has these primary or secondary behavioral health  diagnoses or complaints: Describes self as mildly depressed. 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  Mood was depressed.  No evidence of  psychosis or delusions.  He explicitly denied SI and HI.  BDI score was by  history 46/64.  BDI on 11/26/2019 was 29.    Interventions:   NPSY INDIVID HLTH INTERV INT   NPSY INDIVID HLTH INTERV ADD    ASSESSMENT    Impressions:  Pt reported the following:  Feeling depressed  Feeling self-critical  Feeling anxious about needing help  Feeling socially withdrawn      PLAN  Continued Psychology services are recommended to address: Continue to follow  patient as needed for emotional support, assistance with coping skills.  Recommendations:  Weekly behavioral intervention    Signed by: Volanda Napoleon, Psy.D. 12/24/2019 11:53:00 AM

## 2019-12-31 ENCOUNTER — Inpatient Hospital Stay
Admission: RE | Admit: 2019-12-31 | Discharge: 2019-12-31 | Disposition: A | Discharge status: Home / Self Care | Payer: Medicare Other | Source: Ambulatory Visit | Attending: Specialist | Admitting: Specialist

## 2019-12-31 ENCOUNTER — Ambulatory Visit: Payer: Medicare Other | Attending: Specialist

## 2019-12-31 DIAGNOSIS — I482 Chronic atrial fibrillation, unspecified: Secondary | ICD-10-CM | POA: Insufficient documentation

## 2019-12-31 DIAGNOSIS — R278 Other lack of coordination: Secondary | ICD-10-CM | POA: Insufficient documentation

## 2019-12-31 DIAGNOSIS — Z9181 History of falling: Secondary | ICD-10-CM | POA: Insufficient documentation

## 2019-12-31 DIAGNOSIS — R411 Anterograde amnesia: Secondary | ICD-10-CM | POA: Insufficient documentation

## 2019-12-31 DIAGNOSIS — R41840 Attention and concentration deficit: Secondary | ICD-10-CM | POA: Insufficient documentation

## 2019-12-31 DIAGNOSIS — R41844 Frontal lobe and executive function deficit: Secondary | ICD-10-CM | POA: Insufficient documentation

## 2019-12-31 LAB — PT/INR
PT INR: 1.2 — ABNORMAL HIGH (ref 0.9–1.1)
PT: 13.7 s — ABNORMAL HIGH (ref 10.1–12.9)

## 2019-12-31 NOTE — Psych (Signed)
Jonathan [name]  MRN: [id number]  Account: [id number]  Session Start: 12/31/2019 11:00:00 AM  Session Stop: 12/31/2019 11:53:00 AM    Total Treatment Minutes: 53.00 Minutes    Psychology Services  Outpatient Rehabilitation Progress Note    Rehab Diagnosis: TBI  Demographics:            Age: 64Y            Gender: Male    Medications and Allergies: Significant rehabilitation considerations:   NKA  Rehabilitation Precautions/Restrictions:   No rehabilitation precautions.    SUBJECTIVE  Patient Reports: Pt reported the following:  Feeling discouraged  Feeling low motivation  Feeling irritable with family    Suicide Risk Screen: Patient has these primary or secondary behavioral health  diagnoses or complaints: Describes self as mildly depressed. 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  Mood was depressed.  No evidence of  psychosis or delusions.  He explicitly denied SI and HI.  BDI score was by  history 46/64.  BDI on 11/26/2019 was 29.    Interventions:   NPSY INDIVID HLTH INTERV INT   NPSY INDIVID HLTH INTERV ADD    ASSESSMENT    Impressions:  Pt reported the following:  Feeling discouraged  Feeling low motivation  Feeling irritable with family    PLAN  Continued Psychology services are recommended to address: Continue to follow  patient as needed for emotional support, assistance with coping skills.  Recommendations:  Weekly behavioral intervention    Signed by: Volanda Napoleon, Psy.D. 12/31/2019 11:53:00 AM

## 2020-01-02 NOTE — Rehab Evaluation ([facility]) (Signed)
NAMEONOFRE GAINS  MRN: [id number]  Account: [id number]  Session Start: 01/02/2020 12:00:00 AM  Session Stop: 01/02/2020 12:00:00 AM    Total Treatment Minutes:  Minutes    Speech/Language Pathology  Outpatient Missed Visit Note    The patient did not attend the therapy appointment on 01/02/2020 at 1000.    Reason:  Scheduling conflict.  Future Appointments: The patient has additional appointments.    Signed by: Cannon Kettle, M.A., CCC/SLP 01/02/2020 10:00:00 AM

## 2020-01-07 ENCOUNTER — Encounter (INDEPENDENT_AMBULATORY_CARE_PROVIDER_SITE_OTHER): Payer: Self-pay

## 2020-01-08 ENCOUNTER — Encounter (INDEPENDENT_AMBULATORY_CARE_PROVIDER_SITE_OTHER): Payer: Self-pay

## 2020-01-16 NOTE — Psych (Signed)
Jonathan [name] [name]  MRN: [id number]  Account: [id number]  Session Start: 01/16/2020 11:00:00 AM  Session Stop: 01/16/2020 11:53:00 AM    Total Treatment Minutes: 53.00 Minutes    Psychology Services  Outpatient Rehabilitation Progress Note    Rehab Diagnosis: TBI  Demographics:            Age: 35Y            Gender: Male    Medications and Allergies: Significant rehabilitation considerations:   NKA  Rehabilitation Precautions/Restrictions:   No rehabilitation precautions.    SUBJECTIVE  Patient Reports: Pt reported the following:  Feeling anxiety about loss  Feeling increased motivation  Feeling intermittent panic    Suicide Risk Screen: Patient has these primary or secondary behavioral health  diagnoses or complaints: Describes self as mildly depressed. 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  Mood was depressed.  No evidence of  psychosis or delusions.  He explicitly denied SI and HI.  BDI score was by  history 46/64.  BDI on 11/26/2019 was 29.    Interventions:   NPSY INDIVID HLTH INTERV INT   NPSY INDIVID HLTH INTERV ADD    ASSESSMENT    Impressions:  Pt reported the following:  Feeling anxiety about loss  Feeling increased motivation  Feeling intermittent panic    PLAN  Continued Psychology services are recommended to address: Continue to follow  patient as needed for emotional support, assistance with coping skills.  Recommendations:  Weekly behavioral intervention    Signed by: Volanda Napoleon, Psy.D. 01/16/2020 11:53:00 AM

## 2020-01-21 ENCOUNTER — Encounter (INDEPENDENT_AMBULATORY_CARE_PROVIDER_SITE_OTHER): Payer: Medicare Other

## 2020-01-22 NOTE — Psych (Signed)
[name]  MRN: [id number]  Account: [id number]  Session Start: 01/21/2020 11:00:00 AM  Session Stop: 01/21/2020 11:53:00 AM    Total Treatment Minutes: 53.00 Minutes    Psychology Services  Outpatient Rehabilitation Progress Note    Rehab Diagnosis: TBI  Demographics:            Age: 26Y            Gender: Male    Medications and Allergies: Significant rehabilitation considerations:   NKA  Rehabilitation Precautions/Restrictions:   No rehabilitation precautions.    SUBJECTIVE  Patient Reports: Pt reported the following:  Feeling depressed  Feeling self-critical  Feeling easily discouraged    Suicide Risk Screen: Patient has these primary or secondary behavioral health  diagnoses or complaints: Describes self as mildly depressed. 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  Mood was depressed.  No evidence of  psychosis or delusions.  He explicitly denied SI and HI.  BDI score was by  history 46/64.  BDI on 11/26/2019 was 29.    Interventions:   NPSY INDIVID HLTH INTERV INT   NPSY INDIVID HLTH INTERV ADD    ASSESSMENT    Impressions:  Pt reported the following:  Feeling depressed  Feeling self-critical  Feeling easily discouraged      PLAN  Continued Psychology services are recommended to address: Continue to follow  patient as needed for emotional support, assistance with coping skills.  Recommendations:  Weekly behavioral intervention    Signed by: Volanda Napoleon, Psy.D. 01/21/2020 11:53:00 AM

## 2020-01-23 ENCOUNTER — Inpatient Hospital Stay
Admission: RE | Admit: 2020-01-23 | Discharge: 2020-01-23 | Disposition: A | Discharge status: Home / Self Care | Payer: Medicare Other | Source: Ambulatory Visit

## 2020-01-23 LAB — PT/INR
PT INR: 2.2 — ABNORMAL HIGH (ref 0.9–1.1)
PT: 24.7 s — ABNORMAL HIGH (ref 10.1–12.9)

## 2020-01-23 NOTE — Rehab Progress Note ([facility]) (Signed)
Jonathan [name]  MRN: [id number]  Account: [id number]  Session Start: 01/23/2020 9:03:00 AM  Session Stop: 01/23/2020 9:58:00 AM    Total Treatment Minutes: 55.00 Minutes    Physical Therapy  Outpatient Progress Summary    Medical Diagnosis: Rank Code      Description    Date of Onset    1    S06.2X1   Diffuse traumatic brain injury with loss of      05/15/2019                 consciousness of 30 minutes or less  1    S06.2X1   Diffuse traumatic brain injury with loss of      05/16/2019                 consciousness of 30 minutes or less  Therapy Diagnosis:  Rank Code      Description     Date of Onset    1    Z91.81    History of falling                               05/15/2019  2    R27.8     Other lack of coordination                       05/15/2019  3    R26.8     Other abnormalities of gait and mobility         05/15/2019  Demographics:              Date of Birth: Apr 01, 1951   Age: 67Y   Gender: Male  Medications and Allergies: Significant rehabilitation considerations:   NKA  Rehabilitation Precautions/Restrictions:   fall risk  pacemaker  Initial Evaluation Date: 05/15/2019 10:00:00 AM  Reporting Period: 10/24/19 - 01/23/20    Patient Report:  No pain in right foot unless walking barefoot on tile floor; no pain when  walking in community in shoes.  Has been swimming but feeling very uncoordinated and weak in legs.  Walking in neighborhood for a few weeks (can do 2-3 km) without pain in R foot;  reported to be stumbling  Has fallen 4x in the past 2 months (retropulsion after standing and bending over  to pick up something from the pool deck)  Personal trainer 1x/week: leg lifts, stretches for body    Patient/Caregiver Goals: "To improve my balance and endurance."  "To get rid of the unsteadiness"  "To walk like a normal person"  " To get back to where I was before I discharged from PT."  Pain: Patient currently without complaints of pain.  General Observation: Pt received on-time in lobby. Pt ambulated back to  gym  without AD, independently, slight shuffling gait pattern noted. Pt dons mask and  glasses for duration of session.    Interventions:       Physical Performance Test with Report: Focus of session on re-assessment of  outcome measures, discussion of progress in PT, and plan of care.  Discussed  plan for 10 outpatient PT visits to improve LLE strength and dynamic balance.  All questions were addressed.    Seated: BP: 125/82  HR: 60  SpO2: 96%    Special Tests:  See below.    Hip Flexion: L: 4  R: 4+  Hip Extension: L: 3  R: 4  Hip abduction: L: 4  R: 5  Knee flexion: L: 3+ R: 5  Knee extension: L: 5  R: 5  Ankle DF: L: 3+ R: 5    Coordination:  Moderate impairment LLE with heel to shin and alternating toe-tapping    FGA:  01/23/20:18/30  10/24/19: 17/30  07/24/19: 28/30    5xsit<-->stand: unable to complete test; can only complete 1 repetition without  UE support    10 meter walk test:  Self-selected speed:  01/23/20: Avg: 11.52 seconds; Speed: 0.87 m/s  10/24/19: Avg: 0.75 m/s  07/24/19: Avg: 1.07 m/s    Fast speed:  01/23/20: Avg: 7.38 seconds; Speed: 1.35 m/s  10/24/19: Avg: 1.14 m/s  07/24/19: 1.57 m/s    6 minute walk test:  01/24/20: Not re-assessed due to time restraints  10/24/19: 977 feet  07/24/19:1,504 feet    Functional Gait Assessment:       GAIT LEVEL SURFACE: Mild impairment - walks 47ft, 7< >5.5 sec, assistive  device, slower speed, mild gait deviations or deviates 6-10in outside 12in  walkway width       CHANGE IN GAIT SPEED: Mild impairment - able to change speed - mild gait  deviations, deviate 6-10in outside 12in walkway or no gait deviations but no  significant change in velocity or uses assistive device.       GAIT WITH HORIZONTAL HEAD TURNS: Mild impairment - performs head turns  smoothly with slight change in gait velocity, deviate 6-10in outside 12in  walkway, or uses assistive device       GAIT WITH VERTICAL HEAD TURNS: Normal - performs with no change in gait,  deviates no more than 6in outside  12in walkway       GAIT AND PIVOT TURN: Normal - pivot turns safely w/in 3 sec and stops  quickly with no loss of balance.       STEP OVER OBSTACLE: Mild impairment - is able to step over one shoe box w/o  changing gait speed; no evidence of imbalance       GAIT WITH NARROW BASE OF SUPPORT: Severe impairment - ambulates less than 4  steps heel/toe or cannot perform       GAIT WITH EYES CLOSED: Moderate impairment - walks 32ft, slow speed,  abnormal gait pattern, evidence for imbalance, deviates 10-15in outside 12in  walkway. Require more than 9 sec to ambulate 20 ft       AMBULATING BACKWARDS: Mild impairment - walks 71ft using assistive device,  slower speed, mild gait deviations, deviate 6-10in outside 12in walkway       STEPS: Moderate impairment - two feet to a stair; must use rail  TOTAL SCORE: 18 /30          Pain Reassessment: Pain was not reassessed as no pain was reported.  Education:    Education Provided: Precautions. Plan of care. Bed mobility. Functional  transfers. Fall prevention/balance training. Gait. Home exercise/activity plan.  Stair/curb/environmental barrier negotiation.       Audience: Patient.       Mode: Explanation.  Demonstration.       Response: Verbalized understanding.  Needs practice.    Number of Visits to Date: Today's visit is number  6    Activity/Participation Problem List and Goals: Other  Goal: LTG: 10 visits from 10/24/19: Pt will improve FGA score from 17/30 to 24/30  to demonstrate clinically significant improvement in dynamic balance and  decrease falls risk.  NOT MET    LTG: 10 visits from 10/24/19: Pt will improve self-selected  gait speed from 0.75  m/s to 1.0 m/s to demonstrate a decreased falls risk and clinically significant  improvement in gait speed.  NOT MET    LTG: 10 visits from 10/24/19: Pt will demonstrate a 20% improvement in 6 minute  walk test to demonstrate improved ambulation endurance and allow pt to safely  ambulate to doctors and therapy appointments.  NOT  re-assessed    LTG: 10 visits from 10/24/19: Pt will negotiate 1 flight of stairs with single HR  and reciprocal pattern with mod I to allow pt to safely negotiate the stairs  within his home.  NOT MET      ______________________________________________________________________  UPDATED GOALS  LTG: 10 visits from 01/23/20: Pt will improve FGA score from 17/30 to 24/30 to  demonstrate clinically significant improvement in dynamic balance and decrease  falls risk.    LTG: 10 visits from 01/23/20: Pt will improve self-selected gait speed from 0.75  m/s to 1.0 m/s to demonstrate a decreased falls risk and clinically significant  improvement in gait speed.    LTG: 10 visits from 01/23/20: Pt will demonstrate a 20% improvement in 6 minute  walk test to demonstrate improved ambulation endurance and allow pt to safely  ambulate to doctors and therapy appointments.    LTG: 10 visits from 01/23/20: Pt will negotiate 1 flight of stairs with single HR  and reciprocal pattern with mod I to allow pt to safely negotiate the stairs  within his home.  Status: Goals have not been met as pt was on a hold from PT until the stress  fracture in his R foot healed.      Functions/Structures Problem List and Goals:  Other: LTG: 10 visits from 01/23/20: Pt will improve L hip extension MMT from 3/5  to 3+/5 in order to improve ambulation tolerance and safety with stair  negotiation    LTG: 10 visits from 01/23/20: Pt will be independent and compliant with HEP to  improve LLE strength and dynamic balance to reduce falls risk within the home.  Status: Goals to improve LLE strength have been added due to new weakness from  re-assessment this date.    Progress Summary: Jonathan [name] was re-evaluated on this date. He suffered a  stress fracture in his R foot and was put on hold from outpatient PT on 11/26/19.  At this time, pt reports virtually no pain in R foot with gait. He presents with  LLE weakness, impaired coordination in LLE, impaired dynamic  balance, and slowed  gait speed. Further, he reports multiple falls since 11/26/19 and has avoided  negotiating the stairs in his home due to fear of falling. Of note, worsening  retropulsion, shuffling, new tremor in LLE, and difficulty negotiating  thresholds observed this session. Recommended pt follow-up with Neurologist for  re-assessment. Recommend 10 additional outpatient PT sessions in order to reduce  falls risk and maximize safety and independence within his home. Pt agreeable to  plan.      Equipment Provided/Recommended: No equipment was issued or recommended.  Rehabilitation Potential:  Patient?s condition has potential to improve.  Maximum improvement is yet to be attained.  I expect that the anticipated improvement is attainable in a  reasonable/predictable period of time.  Necessity: Patient requires physical therapy plan in order to return to  Premorbid environment (or reside in new living environment).  Patient requires outpatient, skilled physical therapy in order to maximize  independence and safety while reducing burden of care during functional mobility  in the home.  Patient requires physical therapy to reduce fall risk, improve muscle  coordination, and improvement functional mobility and independence  Recommended Consults: None currently.  Recommendations: Physical Therapy is recommended for 2x/week for 10 visits from  progress summary on 01/23/20 Physical Therapy treatment is to include: Canalith  Repositioning.  Investment banker, operational.  Manual Therapy.  Mechanical Traction.  Neuromuscular Re-education.  Physical Performance Test.  Therapeutic Activity.  Therapeutic Exercise.  Group Therapy.    Physician Certification: This is to certify that the above named patient, who is  under my care, requires skilled Physical Therapy services as described in the  above treatment plan.  I further certify that the services outlined in this plan  are skilled and medically necessary.  I have reviewed this plan  for  rehabilitation services, and I recommend that these services continue 90 days  from 01/23/20  to meet the goals stated above.    Physician signature: __________________ MD,  Date of certification:  ____/____/____    Signed by: Bertis Jonathan, PT 01/23/2020 10:00:00 AM

## 2020-01-23 NOTE — Psych (Signed)
NAMESAINT [name]  MRN: [id number]  Account: [id number]  Session Start: 01/23/2020 11:00:00 AM  Session Stop: 01/23/2020 11:53:00 AM    Total Treatment Minutes: 53.00 Minutes    Psychology Services  Outpatient Rehabilitation Progress Note    Rehab Diagnosis: TBI  Demographics:            Age: 24Y            Gender: Male    Medications and Allergies: Significant rehabilitation considerations:   NKA  Rehabilitation Precautions/Restrictions:   No rehabilitation precautions.    SUBJECTIVE  Patient Reports: Pt reported the following:  Feeling less depressed  Feeling less anxious    Suicide Risk Screen: Patient has these primary or secondary behavioral health  diagnoses or complaints: Describes self as mildly depressed. 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  Mood was depressed.  No evidence of  psychosis or delusions.  He explicitly denied SI and HI.  BDI score was by  history 46/64.  BDI on 11/26/2019 was 29.    Interventions:   NPSY INDIVID HLTH INTERV INT   NPSY INDIVID HLTH INTERV ADD    ASSESSMENT    Impressions:  Pt reported the following:  Feeling less depressed  Feeling less anxious    PLAN  Continued Psychology services are recommended to address: Continue to follow  patient as needed for emotional support, assistance with coping skills.  Recommendations:  Weekly behavioral intervention    Signed by: Volanda Napoleon, Psy.D. 01/23/2020 11:53:00 AM
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
## 2020-01-29 NOTE — Psych (Signed)
NAMEBRENTON [name]  MRN: [id number]  Account: [id number]  Session Start: 01/28/2020 11:00:00 AM  Session Stop: 01/28/2020 11:53:00 AM    Total Treatment Minutes: 53.00 Minutes    Psychology Services  Outpatient Rehabilitation Progress Note    Rehab Diagnosis: TBI  Demographics:            Age: 6Y            Gender: Male    Medications and Allergies: Significant rehabilitation considerations:   NKA  Rehabilitation Precautions/Restrictions:   No rehabilitation precautions.    SUBJECTIVE  Patient Reports: Pt reported the following:  Feeling less depressed  Feeling anxious about the future    Suicide Risk Screen: Patient has these primary or secondary behavioral health  diagnoses or complaints: Describes self as mildly depressed. 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  Mood was depressed.  No evidence of  psychosis or delusions.  He explicitly denied SI and HI.  BDI score was by  history 46/64.  BDI on 11/26/2019 was 29.    Interventions:   NPSY INDIVID HLTH INTERV INT 30MIN   NPSY INDIVID HLTH INTERV ADD 15MIN    ASSESSMENT    Impressions:  Pt reported the following:  Feeling less depressed  Feeling anxious about the future      PLAN  Continued Psychology services are recommended to address: Continue to follow  patient as needed for emotional support, assistance with coping skills.  Recommendations:  Weekly behavioral intervention    Signed by: Kyra Searles, Psy.D. 01/28/2020 11:53:00 AM

## 2020-01-30 ENCOUNTER — Ambulatory Visit: Payer: Medicare Other | Attending: Specialist

## 2020-01-30 DIAGNOSIS — R411 Anterograde amnesia: Secondary | ICD-10-CM | POA: Insufficient documentation

## 2020-01-30 DIAGNOSIS — R41844 Frontal lobe and executive function deficit: Secondary | ICD-10-CM | POA: Insufficient documentation

## 2020-01-30 DIAGNOSIS — R41840 Attention and concentration deficit: Secondary | ICD-10-CM | POA: Insufficient documentation

## 2020-01-30 DIAGNOSIS — Z9181 History of falling: Secondary | ICD-10-CM | POA: Insufficient documentation

## 2020-01-30 DIAGNOSIS — R278 Other lack of coordination: Secondary | ICD-10-CM | POA: Insufficient documentation

## 2020-01-30 NOTE — Rehab Progress Note ([facility]) (Signed)
Jonathan [name] [name]  MRN: [id number]  Account: [id number]  Session Start: 01/30/2020 9:02:00 AM  Session Stop: 01/30/2020 9:57:00 AM    Total Treatment Minutes: 55.00 Minutes    Physical Therapy  Outpatient Treatment Note    Medical Diagnosis: Rank Code      Description    Date of Onset    1    S06.2X1   Diffuse traumatic brain injury with loss of      05/15/2019                 consciousness of 30 minutes or less  1    S06.2X1   Diffuse traumatic brain injury with loss of      05/16/2019                 consciousness of 30 minutes or less  Therapy Diagnosis:  Rank Code      Description     Date of Onset    1    Z91.81    History of falling                               05/15/2019  2    R27.8     Other lack of coordination                       05/15/2019  3    R26.8     Other abnormalities of gait and mobility         05/15/2019  Demographics:            Age: 55Y            Gender: Male    Rehabilitation Precautions/Restrictions:   fall risk, pacemaker    SUBJECTIVE  Patient Report: Feeling about the same. Family is coming into town next week.  Patient/Caregiver Goals: "To improve my balance and endurance."  "To get rid of the unsteadiness"  "To walk like a normal person"  " To get back to where I was before I discharged from PT."  Pain: Patient currently without complaints of pain.    OBJECTIVE  General Observation: Pt received on-time in lobby. Pt ambulated back to gym  without AD, independently, slight shuffling gait pattern noted. Pt dons mask and  glasses for duration of session.  Other/Additional Findings: BP: 117/79  HR: 65    Interventions:       Therapeutic Activities:  .       Neuromuscular Reeducation:  .    Focus of session on updating HEP for LE strengthening and improving  standing/dynamic balance.    TA:    6 minute walk test:  01/30/20: 1,231 feet  10/24/19: 977 feet  07/24/19:1,504 feet    Total Gym:  Seat position 17  Leg press: BLE: 1x10; single leg: 2x10    HEP update:  Prone hip extension: 2x10  Prone  knee flexino: 2x10  Verbal cuing for limiting compensations; printed hand-out provided    NMRE:  Standing balance:  Rockerboard with A/P weight-shifting: 8 minutes: static stance, controlled  weight-shifting, eyes closed 3-5 sec at a time; required UE support 25% of the  time with eyes closed; some ability to recognize LOB and self-correct    Dynamic balance:  Orange hurdles and step up/over foam pad: set-up obstacle course and pt  completed 5x with good technique, mild staggering, no overt LOB  Pain Reassessment: Pain was not reassessed as no pain was reported.  Education:    Education Provided: Precautions. Plan of care. Functional transfers. Gait. Home  exercise/activity plan.       Audience: Patient.       Mode: Explanation.  Demonstration.       Response: Verbalized understanding.  Needs practice.    ASSESSMENT  Pt tolerated session well and demonstrated improved ambulation tolerance for 6  minute walk test compared to when last tested in March of this year. Recommend  continued walks around his neighborhood (1-3 km depending on the day). Provided  an updated HEP for glut and hamstring strengthening. Pt completed dynamic  balance "obstacle course" without overt LOB though noted imbalance when turning  180 degrees and when starting/stopping movement. Will focus next session on  addressing these dynamic balance activities.    Activity/Participation Problem List and Goals: No updates at this time.  Functions/Structures Problem List and Goals: No updates at this time.    PLAN  Treatment Frequency, Duration, and Interventions: Continue Physical Therapy to  achieve goals per previously established Plan of Care.  Development of Plan of Care: There was no change to plan of care today. Patient  and/or family continue to be in agreement with plan of care.  The patient has been instructed to contact our clinic if any questions or  problems should arise.    Visit Number: Today's visit is number  7    Signed by:  Bertis Ruddy, PT 01/30/2020 10:00:00 AM

## 2020-01-30 NOTE — Psych (Signed)
Jonathan [name]  MRN: [id number]  Account: [id number]  Session Start: 01/30/2020 11:00:00 AM  Session Stop: 01/30/2020 11:53:00 AM    Total Treatment Minutes: 53.00 Minutes    Psychology Services  Outpatient Rehabilitation Progress Note    Rehab Diagnosis: TBI  Demographics:            Age: 20Y            Gender: Male    Medications and Allergies: Significant rehabilitation considerations:   NKA  Rehabilitation Precautions/Restrictions:   No rehabilitation precautions.    SUBJECTIVE  Patient Reports: Pt reported the following:  Feeling anxious  Feeling worried about negative future events  Feeling sad    Suicide Risk Screen: Patient has these primary or secondary behavioral health  diagnoses or complaints: Describes self as mildly depressed. 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  Mood was depressed.  No evidence of  psychosis or delusions.  He explicitly denied SI and HI.  BDI score was by  history 46/64.  BDI on 11/26/2019 was 29.    Interventions:   NPSY INDIVID HLTH INTERV INT   NPSY INDIVID HLTH INTERV ADD    ASSESSMENT    Impressions:  Pt reported the following:  Feeling anxious  Feeling worried about negative future events  Feeling sad      PLAN  Continued Psychology services are recommended to address: Continue to follow  patient as needed for emotional support, assistance with coping skills.  Recommendations:  Weekly behavioral intervention    Signed by: Volanda Napoleon, Psy.D. 01/30/2020 11:53:00 AM

## 2020-01-31 NOTE — Rehab Evaluation ([facility]) (Signed)
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 There's probably 5 or 10 minutes that I get  anxious that I remember there's something and I don?t remember what it is. It  will usually come to me, but sometimes it won't."  Patient/Caregiver Goals: - To reduce amount of assistance from wife for routine  activities such as driving and managing all aspects of healthcare  - "I want to be able to remember stuff"; despite prompting, patient unable to  generate more specific memory goal  Pain: Patient currently without complaints of pain.    OBJECTIVE  General Observation: Patient arrived to session on time; unaccompanied. SLP and  patient remained masked for duration of session d/t COVID19 precautions.    Interventions:       Development of Cognitive Skills:  .       Development of Cognitive Skills:  .    Patient completed select subtests of the Functional Assessment of Verbal  Reasoning and Executive Skills ?Task 1: Planning an Event? with results as  follows:    Time:  11 minutes  (SS = 79, 11th %ile)  Accuracy: 5/5 (SS = 108, 100th %ile)  Rationale: 5/5 (SS = 106, 100th %ile)  Reasoning Subskills: 11 (ABI Mean: 17.14, Control Mean: 22.44)  Generating Alternatives: 3 words/1 minute (ABI Mean: 6.5 Control Mean: 10.88)  + Adequate reasoning to select appropriate event given set of known variables  - Significant difficulty verbalizing/explaining process on how he arrived at the  correct answer  - Unable to indentify irrelavant facts/information ("Well I think everything is  important")  - Signifcantly reduced cognitive flexibility to generate alternative solutions  given new information    Session also focused on education re: results of standardized testing completed  today and at the time of initial evaluation (01/23/2020), impact of TBI and  identified emotional stressors on cognitive functioning, and collaborative goal  setting to objectively measure progress  throughout his time with SLP therapy.  Patient responsive to education provided and verbalized understanding.    Pain Reassessment: Pain was not reassessed as no pain was reported.  Education:    Education Provided: Plan of care. Results of standardized assessment. Goal  setting. Cognitive aids. .       Audience: Patient.       Mode: Explanation.  Teacher, English as a foreign language provided.       Response: Verbalized understanding.  Needs practice.    ASSESSMENT  Additional standardized testing today reveals signifcantly reduced cognitive  flexibility to generate alternative solutions given new information. Patient  acknowledges functional impact of this deficit area in his ability to make plans  or adapt to new situations/spontaneous events in everyday life. Appreciate  impact of underlying mood disorders likely exacerbating patient's problem  sovling and generative thinking skills. New goal added to address aforementioned  impairment.    Activity/Participation Problem List and Goals: Other  Goal: .  Status:  LTG 1 (9 sessions from 01/30/2020): Patient  will use external cognitive supports  (eg., checklists, electronic reminders, calendar)  to plan and follow through  with at least 80% of scheduled routine and nonroutine tasks independently.      Functions/Structures Problem List and Goals: Other: .  Status:  LTG 2 (9 sessions from 01/30/2020): Patietn will generate at least 2-3 solutions  to functional problem solving tasks and/or hypothetical scenarios (eg., planning  a trip, selecting a restaurant and making a dinner reservation) given occasional  verbal cues from SLP for cognitive flexibility.    PLAN  Treatment Frequency, Duration, and Interventions: Continue Speech/Language  Pathology to achieve goals per previously established Plan of Care.  Development of Plan of Care: There was no change to plan of care today. Patient  and/or family continue to be in agreement with plan of care.  The patient has been instructed to contact our clinic if any questions or  problems should arise.    Visit Number: Today's visit is number  2    Signed by: Cannon Kettle, M.A., CCC/SLP 01/30/2020 11:00:00 AM

## 2020-02-04 ENCOUNTER — Other Ambulatory Visit: Payer: Self-pay | Admitting: Specialist

## 2020-02-04 DIAGNOSIS — M545 Low back pain, unspecified: Secondary | ICD-10-CM

## 2020-02-04 DIAGNOSIS — R519 Headache, unspecified: Secondary | ICD-10-CM

## 2020-02-06 ENCOUNTER — Encounter (INDEPENDENT_AMBULATORY_CARE_PROVIDER_SITE_OTHER): Payer: Self-pay

## 2020-02-07 ENCOUNTER — Encounter (INDEPENDENT_AMBULATORY_CARE_PROVIDER_SITE_OTHER): Payer: Self-pay

## 2020-02-10 ENCOUNTER — Ambulatory Visit
Admission: RE | Admit: 2020-02-10 | Discharge: 2020-02-10 | Disposition: A | Discharge status: Home / Self Care | Payer: Medicare Other | Source: Ambulatory Visit | Attending: Specialist | Admitting: Specialist

## 2020-02-10 DIAGNOSIS — M2578 Osteophyte, vertebrae: Secondary | ICD-10-CM | POA: Insufficient documentation

## 2020-02-10 DIAGNOSIS — M545 Low back pain, unspecified: Secondary | ICD-10-CM

## 2020-02-10 DIAGNOSIS — M5127 Other intervertebral disc displacement, lumbosacral region: Secondary | ICD-10-CM | POA: Insufficient documentation

## 2020-02-10 DIAGNOSIS — R519 Headache, unspecified: Secondary | ICD-10-CM | POA: Insufficient documentation

## 2020-02-10 DIAGNOSIS — M5106 Intervertebral disc disorders with myelopathy, lumbar region: Secondary | ICD-10-CM | POA: Insufficient documentation

## 2020-02-10 DIAGNOSIS — M48061 Spinal stenosis, lumbar region without neurogenic claudication: Secondary | ICD-10-CM | POA: Insufficient documentation

## 2020-02-10 DIAGNOSIS — G3189 Other specified degenerative diseases of nervous system: Secondary | ICD-10-CM | POA: Insufficient documentation

## 2020-02-10 DIAGNOSIS — M4716 Other spondylosis with myelopathy, lumbar region: Secondary | ICD-10-CM | POA: Insufficient documentation

## 2020-02-10 DIAGNOSIS — G9389 Other specified disorders of brain: Secondary | ICD-10-CM | POA: Insufficient documentation

## 2020-02-10 DIAGNOSIS — M47817 Spondylosis without myelopathy or radiculopathy, lumbosacral region: Secondary | ICD-10-CM | POA: Insufficient documentation

## 2020-02-10 DIAGNOSIS — M4807 Spinal stenosis, lumbosacral region: Secondary | ICD-10-CM | POA: Insufficient documentation

## 2020-02-10 DIAGNOSIS — I482 Chronic atrial fibrillation, unspecified: Secondary | ICD-10-CM | POA: Insufficient documentation

## 2020-02-10 LAB — PT/INR
PT INR: 1.8 — ABNORMAL HIGH (ref 0.9–1.1)
PT: 20.8 s — ABNORMAL HIGH (ref 10.1–12.9)

## 2020-02-11 ENCOUNTER — Other Ambulatory Visit: Payer: Self-pay | Admitting: Specialist

## 2020-02-11 ENCOUNTER — Ambulatory Visit
Admission: RE | Admit: 2020-02-11 | Discharge: 2020-02-11 | Disposition: A | Discharge status: Home / Self Care | Payer: Medicare Other | Source: Ambulatory Visit | Attending: Specialist | Admitting: Specialist

## 2020-02-11 DIAGNOSIS — R042 Hemoptysis: Secondary | ICD-10-CM

## 2020-02-11 NOTE — Rehab Progress Note ([facility]) (Signed)
Jonathan [name]  MRN: [id number]  Account: [id number]  Session Start: 02/11/2020 10:02:00 AM  Session Stop: 02/11/2020 10:57:00 AM    Total Treatment Minutes: 55.00 Minutes    Physical Therapy  Outpatient Treatment Note    Medical Diagnosis: Rank Code      Description    Date of Onset    1    S06.2X1   Diffuse traumatic brain injury with loss of      05/15/2019                 consciousness of 30 minutes or less  1    S06.2X1   Diffuse traumatic brain injury with loss of      05/16/2019                 consciousness of 30 minutes or less  Therapy Diagnosis:  Rank Code      Description     Date of Onset    1    Z91.81    History of falling                               05/15/2019  2    R27.8     Other lack of coordination                       05/15/2019  3    R26.8     Other abnormalities of gait and mobility         05/15/2019  Demographics:            Age: 42Y            Gender: Male    Rehabilitation Precautions/Restrictions:   fall risk, pacemaker    SUBJECTIVE  Patient Report: Pt reports he's been coughing up blood since Sunday evening. He  reports to overwhelming fatigue and lays in bed for most of the day. He has  reported SOB with activity (going up/down the stairs and walking in the  community). He has been trying to lose weight and has lost 7 lb recently. He  does not eat breakfast, will eat a small lunch (ex: an apple), and will eat  dinner. He drinks a lot of diet Ginger Ale. He reports to feeling unsteady and  weak. He has done his exercises 1x in the past 10 days. He did go to a [employer] and was able to walk in/out of the stadium but was fatigued.  Patient/Caregiver Goals: "To improve my balance and endurance."  "To get rid of the unsteadiness"  "To walk like a normal person"  " To get back to where I was before I discharged from PT."  Pain: Patient currently without complaints of pain.    OBJECTIVE  General Observation: Pt received on-time in lobby. Pt ambulated back to gym  without AD,  independently, shuffling gait pattern noted; decreased gait speed.  Pt dons mask and glasses for duration of session.  Other/Additional Findings: BP upon arrival: 92/64  BP at end of session: 108/69    Interventions:       Neuromuscular Reeducation:  .    Focus of session on improving standing and dynamic balance. Frequent seated rest  breaks provided due to fatigue and SOB.    NMRE:  Standing balance on foam:  -Romberg stance: eyes open, eyes closed, head turns (horizontal and vertical)  -Tandem stance: 3x30 sec on each  leg; mod sway; cuing for core activation and  posture due to posterior LOB    Dynamic balance:  -Orange hurdles: fwd stepping and side-stepping; 10 laps completed of each;  frequent UE support to stabilize self when stepping over obstacle side-ways  -180 degree turns; staggering, shuffling, imbalance requiring UE support 50% of  the time.    Pain Reassessment: Pain was not reassessed as no pain was reported.  Education:    Education Provided: Precautions. Plan of care. Safety. Gait. Home  exercise/activity plan.       Audience: Patient.       Mode: Explanation.  Demonstration.       Response: Verbalized understanding.  Needs practice.    ASSESSMENT  Encouraged pt to follow-up with PCP today regarding hemoptysis and fatigue.  Further, recommend pt increase water intake and eat/drink water prior to coming  to therapy sessions. Pt wrote down recommendations in notebook. Noted more  staggering, shuffling gait this session with pt requiring frequent rest breaks  due to fatigue and some dyspnea on exertion.    Activity/Participation Problem List and Goals: No updates at this time.  Functions/Structures Problem List and Goals: No updates at this time.    PLAN  Treatment Frequency, Duration, and Interventions: Continue Physical Therapy to  achieve goals per previously established Plan of Care.  Development of Plan of Care: There was no change to plan of care today. Patient  and/or family continue to be in  agreement with plan of care.  The patient has been instructed to contact our clinic if any questions or  problems should arise.    Visit Number: Today's visit is number  8    Signed by: Bertis Ruddy, PT 02/11/2020 11:00:00 AM

## 2020-02-11 NOTE — Rehab Evaluation ([facility]) (Signed)
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  Sometimes its red and runny, sometimes its black and clotty."  Patient/Caregiver Goals: - To reduce amount of assistance from wife for routine  activities such as driving and managing all aspects of healthcare  - "I want to be able to remember stuff"; despite prompting, patient unable to  generate more specific memory goal  Pain: Patient currently without complaints of pain.    OBJECTIVE  General Observation: Patient arrived to session on time; unaccompanied. SLP and  patient remained masked for duration of session d/t COVID19 precautions.    Interventions:       Development of Cognitive Skills:  .       Development of Cognitive Skills:  .      Patient presents at start of session reporting he has been coughing up small  amounts of blood since Sunday. Reduced problem solving/cognitive flexibility  observed as patient reports he would follow up with his PCP, but he is currently  out of the office on vacation. SLP strongly advised patient to call PCP office  to see  if he can be seen by another doctor, as he presents with additional  reported symptoms including increased fatigue, poor appetite, 5-8 lb weight loss  over one week period. Following initial prompt for initiation, patient able to  contact PCP office and leave message for covering doctor independently. Patient  has not yet followed up with neurology as previously recommended by his  healthcare team; states his wife is planning to follow through. Patient required  extensive emotional support throughout today's session, constantly expressing  his constant feelings of worry ("I'm just so worried about everything all the  time." "Why am I like this?"). SLP provided supportive listening and  encouragement when appropriate. Will collaborate as possible with  neuropsychology for coping strategies.    LTG 1 (9 sessions from 01/30/2020): Patient will use external cognitive supports  (eg., checklists, electronic reminders, calendar)  to plan and  follow through  with at least 80% of scheduled routine and nonroutine tasks independently.  PROGRESS - Patient observed to independently initiate use of written encoding  strategies in his personal notebook to generate to do list to follow up with PCP  office, neurologist, and bank per a phone call recieved within session today.    LTG 2 (9 sessions from 01/30/2020): Patietn will generate at least 2-3 solutions  to functional problem solving tasks and/or hypothetical scenarios (eg., planning  a trip, selecting a restaurant and making a dinner reservation) given occasional  verbal cues from SLP for cognitive flexibility. - As stated above, required  min-moderate verbal cues/scaffolding to support problem solving and cognitive  flexibility to generate alternative soutions in contacting his PCP's office to  report new onset symptoms.        Pain Reassessment: Pain was not reassessed as no pain was reported.  Education:    Education Provided: Plan of care. Rehab techniques and procedures.  Planning  strategies. Cognitive aids. TBI recovery. .       Audience: Patient.       Mode: Explanation.       Response: Verbalized understanding.  Needs practice.    ASSESSMENT  Reduced problem solving/cognitive flexibility to generate alternative solutions  to follow up with PCP's office despite his primary doctor being on vacation with  new onset of concerning physical symptoms. Patient required extensive emotional  support throughout today's session, constantly expressing his constant feelings  of worry. SLP provided supportive listening and encouragement when appropriate.  Will collaborate as possible with neuropsychology for coping strategies.          Activity/Participation Problem List and Goals: No updates at this time.  Functions/Structures Problem List and Goals: No updates at this time.    PLAN  Treatment Frequency, Duration, and Interventions: Continue Speech/Language  Pathology to achieve goals per previously established Plan of Care.  Development of Plan of Care: There was no change to plan of care today. Patient  and/or family continue to be in agreement with plan of care.  The patient has been instructed to contact our clinic if any questions or  problems should arise.    Visit Number: Today's visit is number  3    Signed by: Cannon Kettle, M.A., CCC/SLP 02/11/2020 12:00:00 PM

## 2020-02-13 NOTE — Rehab Progress Note ([facility]) (Signed)
NAMEBREYON SIGG  MRN: [id number]  Account: [id number]  Session Start: 02/13/2020 9:02:00 AM  Session Stop: 02/13/2020 9:57:00 AM    Total Treatment Minutes: 55.00 Minutes    Physical Therapy  Outpatient Treatment Note    Medical Diagnosis: Rank Code      Description    Date of Onset    1    S06.2X1   Diffuse traumatic brain injury with loss of      05/15/2019                 consciousness of 30 minutes or less  1    S06.2X1   Diffuse traumatic brain injury with loss of      05/16/2019                 consciousness of 30 minutes or less  Therapy Diagnosis:  Rank Code      Description     Date of Onset    1    Z91.81    History of falling                               05/15/2019  2    R27.8     Other lack of coordination                       05/15/2019  3    R26.8     Other abnormalities of gait and mobility         05/15/2019  Demographics:            Age: 37Y            Gender: Male    Rehabilitation Precautions/Restrictions:   fall risk, pacemaker    SUBJECTIVE  Patient Report: Micah Flesher to PCP on Tuesday and had a chest xray; diagnosed with  bronchitis and bronchiectasis; prescribed anti-biotics.  Has an appointment with Neurologist first week of August (Dr. Jovita Gamma).  Feeling fatigued and light-headed (no worse than usual)  Patient/Caregiver Goals: "To improve my balance and endurance."  "To get rid of the unsteadiness"  "To walk like a normal person"  " To get back to where I was before I discharged from PT."  Pain: Patient currently without complaints of pain.    OBJECTIVE  General Observation: Pt received on-time in lobby. Pt ambulated back to gym  without AD, independently, shuffling gait pattern noted; decreased gait speed.  Pt dons mask and glasses for duration of session.  Other/Additional Findings: Upon arrival: BP: 90/62; HR: 60  After 30 min of activity: BP: 91/63; HR: 60    Interventions:       Neuromuscular Reeducation:  .    Focus of session on improving standing and dynamic balance.    NMRE:  Standing  balance:    Foam pad with feet apart:  Sit<-->stands with UE support; 1x5  A/P weight-shifting; min A on 3 occasions  R/L weight-shifting; CGA throughout  Standing with eyes closed (5-10 sec)  [place] reaching (5-8 inches outside BOS; reaching in all directions)    Rockerboard with R/L weight-shifting with mirror in front of pt for visual  feedback: static stance, controlled R/L weight-shift, standing with slow blink,  mini squats; occasional UE support for steadying required; 6 minutes total    Dynamic balance:  -Step up/downs on 4-inch and 6-inch curb step without UE support; 2x10 on each  height and alternating leading with  each leg; noted greater instability when  stepping up with LLE; verbal cuing to weight-shift onto LLE prior to stepping up  with RLE with improved success    -Ball toss while walking: ball toss to self x4 laps; ball toss to therapist x4  laps; side-stepping with ball toss to therapist x4 laps    Pain Reassessment: Pain was not reassessed as no pain was reported.  Education:    Education Provided: Precautions. Plan of care. Functional transfers. Safety.  Gait.       Audience: Patient.       Mode: Explanation.  Demonstration.       Response: Verbalized understanding.  Needs practice.    ASSESSMENT  Pt demonstrated difficulty with dual-task activities this session and had  significantly slowed gait speed and step length when challenged by walking and  tossing ball. BP remained low during session and recommend pt contact PCP to  discuss as pt endorses a constant feeling of  dizziness/light-headedness/imbalance. Overwhelming fatigue continues to be a  barrier to progress in therapy (pt reports to laying in bed most of the day when  not in therapy). May consider placing patient on hold after next week until pt  can be seen by his Neurologist to determine potential causes for this extreme  fatigue.    Activity/Participation Problem List and Goals: No updates at this time.  Functions/Structures Problem  List and Goals: No updates at this time.    PLAN  Treatment Frequency, Duration, and Interventions: Continue Physical Therapy to  achieve goals per previously established Plan of Care.  Development of Plan of Care: There was no change to plan of care today. Patient  and/or family continue to be in agreement with plan of care.  The patient has been instructed to contact our clinic if any questions or  problems should arise.    Visit Number: Today's visit is number  9    Signed by: Bertis Ruddy, PT 02/13/2020 10:00:00 AM

## 2020-02-13 NOTE — Rehab Evaluation ([facility]) (Signed)
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  I cant go outside. So I cant exercise?  Patient/Caregiver Goals: - To reduce amount of assistance from wife for routine  activities such as driving and managing all aspects of healthcare  - "I want to be able to remember stuff"; despite prompting, patient unable to  generate more specific memory goal  Pain: Patient currently without complaints of pain.    OBJECTIVE  General Observation: Patient arrived to session on time; unaccompanied. SLP and  patient remained masked for duration of session d/t COVID19 precautions.    Interventions:       Development of Cognitive Skills:  .       Development of Cognitive Skills:  .        Patient reports he was seen at his PCP's office on Tuesday; underwent CXR and  diagnosed with bronchitis and bronchiectasis. Currently on antibiotics.  Appointment scheduled with neurologist (Dr Jovita Gamma) first week of August. Patient  encouraged to follow up with PCP to discuss low BP and extreme fatigue.    LTG 1 (9 sessions from  01/30/2020): Patient will use external cognitive supports  (eg., checklists, electronic reminders, calendar)  to plan and follow through  with at least 80% of scheduled routine and nonroutine tasks independently.  PROGRESS - Required minimal cues to develop template for tracking blood pressure  2x/daily and planned activities for exercise over five day period.    LTG 2 (9 sessions from 01/30/2020): Patietn will generate at least 2-3 solutions  to functional problem solving tasks and/or hypothetical scenarios (eg., planning  a trip, selecting a restaurant and making a dinner reservation) given occasional  verbal cues from SLP for cognitive flexibility. - Patient required min-moderate  cues for cognitive flexibility and problem solving to generate weekend plans  incorporating multiple variables. Patient became upset during session with  frequent negative self talk as he felt he was unable to properly plan for  his  wedding anniversary with his wife.        Pain Reassessment: Pain was not reassessed as no pain was reported.  Education:    Education Provided: Plan of care. TBI. Rehab techniques and procedures. Planning  strategies. Cognitive aids. .       Audience: Patient.       Mode: Explanation.  Teacher, English as a foreign language provided.       Response: Verbalized understanding.  Needs practice.  Needs reinforcement.    ASSESSMENT  Participation in today's session limited by patient's negative self talk and  difficulty redirecting from internal distractions. SLP may consider placing  patient on hold after next week to allow for patient to undergo medical workup  by PCP/neurology to determine potential underlying causes of extreme fatigue  which limit his participation in every day activities.        Activity/Participation Problem List and Goals: No updates at this time.  Functions/Structures Problem List and Goals: No updates at this time.    PLAN  Treatment Frequency, Duration, and Interventions: Continue Speech/Language  Pathology to  achieve goals per previously established Plan of Care.  Development of Plan of Care: There was no change to plan of care today. Patient  and/or family continue to be in agreement with plan of care.  The patient has been instructed to contact our clinic if any questions or  problems should arise.    Visit Number: Today's visit is number  4    Signed by: Cannon Kettle, M.A., CCC/SLP 02/13/2020 11:00:00 AM

## 2020-02-18 ENCOUNTER — Inpatient Hospital Stay
Admission: RE | Admit: 2020-02-18 | Discharge: 2020-02-18 | Disposition: A | Discharge status: Home / Self Care | Payer: Medicare Other | Source: Ambulatory Visit | Attending: Specialist | Admitting: Specialist

## 2020-02-18 DIAGNOSIS — I482 Chronic atrial fibrillation, unspecified: Secondary | ICD-10-CM | POA: Insufficient documentation

## 2020-02-18 LAB — PT AND APTT
PT INR: 2.2 — ABNORMAL HIGH (ref 0.9–1.1)
PT: 25.4 s — ABNORMAL HIGH (ref 10.1–12.9)
PTT: 38 s (ref 27–39)

## 2020-02-18 NOTE — Rehab Evaluation ([facility]) (Signed)
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 Stamina and strength and balance are defintely impacted. He thinks  his memory is terrible, but I do think he remembers the gist but I do have to  remind him. Thanks to you guys we actually went to dinner this weekend and we  went out with a friend for brunch.    He remembers that we have a thing to do, but he won?t remember exactly what the  details are. He needs help with those types of thing.    I would love to see improvement in his physical strength and endurance. And his  walking. I don?t regard his walk as normal. And I would love to see his mood  lift."  Patient/Caregiver Goals: - To reduce amount of assistance from wife for routine  activities such as driving and managing all aspects of healthcare  - "I want to be able to remember stuff"; despite prompting, patient unable to  generate more specific memory goal  Pain: Patient currently without complaints of pain.    OBJECTIVE  General  Observation: Patient arrived to session on time; accompanied by wife  [name]. SLP, patient, wife remained masked for duration of session d/t COVID19  precautions.    Interventions:       Development of Cognitive Skills:  .       Development of Cognitive Skills:  .    Patient's wife present for today's session; provided brief overview re: SLP role  in TBI recovery, patient's plan of care and progress to date; gathered report  re: patient's presentation in the home environment, as stated above.    LTG 1 (9 sessions from 01/30/2020): Patient will use external cognitive supports  (eg., checklists, electronic reminders, calendar)  to plan and follow through  with at least 80% of scheduled routine and nonroutine tasks independently.  PROGRESS - Patient with (+)  follow through of BP tracking and exercise with use  of template developed last session.  Discussed effective use of cognitive aids  including shared electronic calendar, electronic reminders, and use of notebook  for checklists and tracking logs. Training provided to patient's wife re:  collaborative planning strategies and cueing hierarchy to support patient's  planning, organization, follow through, and recall of plan details with trained  cognitive supports. Will likely benefit from ongoing training and education.    LTG 2 (9 sessions from 01/30/2020): Patietn will generate at least 2-3 solutions  to functional problem solving tasks and/or hypothetical scenarios (eg., planning  a trip, selecting a restaurant and making a dinner reservation) given occasional  verbal cues from SLP for cognitive flexibility. - PROGRESS - (+) follow through  with plans from prior session for weekend planning to celebrate wedding  anniversary with wife, though upon retrospective analysis, patient with negative  view of plan execution despite wife's positive feedback. Patient required  min-moderate verbal cues for cognitive flexibility and problem solving to  generate plan and implement  task breakdown for upcoming trip out of town for a  family party.        Pain Reassessment: Pain was not reassessed as no pain was reported.  Education:    Education Provided: Plan of care. Rehab techniques and procedures. Planning  strategies. Cognitive aids. TBI recovery. .       Audience: Patient and significant other.       Mode: Explanation.  Teacher, English as a foreign language provided.       Response: Verbalized understanding.  Needs practice.    ASSESSMENT  Session focus on eduation and training with patient's wife re: plan of care,  identified cognitive deficits in executive functions, impact of underlying  emotional distress on completion of daily activities, and planning  strategies/cueing in the home environment. Patient/wife with good response to  education provided; wife encouraged to continue to attend sessions for increased  generalization of strategies to home environment.        Activity/Participation Problem List and Goals: No updates at this time.  Functions/Structures Problem List and Goals: No updates at this time.    PLAN  Treatment Frequency, Duration, and Interventions: Continue Speech/Language  Pathology to achieve goals per previously established Plan of Care.  Development of Plan of Care: There was no change to plan of care today. Patient  and/or family continue to be in agreement with plan of care.  The patient has been instructed to contact our clinic if any questions or  problems should arise.    Visit Number: Today's visit is number  5    Signed by: Cannon Kettle, M.A., CCC/SLP 02/18/2020 11:00:00 AM
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
## 2020-02-20 NOTE — Rehab Progress Note ([facility]) (Signed)
NAMECLANTON EMANUELSON  MRN: [id number]  Account: [id number]  Session Start: 02/20/2020 9:02:00 AM  Session Stop: 02/20/2020 9:57:00 AM    Total Treatment Minutes: 55.00 Minutes    Physical Therapy  Outpatient Treatment Note    Medical Diagnosis: Rank Code      Description    Date of Onset    1    S06.2X1   Diffuse traumatic brain injury with loss of      05/15/2019                 consciousness of 30 minutes or less  1    S06.2X1   Diffuse traumatic brain injury with loss of      05/16/2019                 consciousness of 30 minutes or less  Therapy Diagnosis:  Rank Code      Description     Date of Onset    1    Z91.81    History of falling                               05/15/2019  2    R27.8     Other lack of coordination                       05/15/2019  3    R26.8     Other abnormalities of gait and mobility         05/15/2019  Demographics:            Age: 40Y            Gender: Male    Rehabilitation Precautions/Restrictions:   fall risk, pacemaker    SUBJECTIVE  Patient Report: I'm feeling extremely tired today. I'm going to [place]  this weekend and I'm dreading it. I did get into the pool yesterday and I did  some walking; it was frustrating because it wasn't as easy as I'd like. I still  have a gait disturbance in the pool.  Patient/Caregiver Goals: "To improve my balance and endurance."  "To get rid of the unsteadiness"  "To walk like a normal person"  " To get back to where I was before I discharged from PT."  Pain: Patient currently has pain.  Patient reports a pain level of 3 out of 10. Repositioned patient.    OBJECTIVE  General Observation: Pt received on-time in lobby. Pt ambulated back to gym  without AD, independently, shuffling gait pattern noted; decreased gait speed.  Pt dons mask and glasses for duration of session.  Other/Additional Findings: BP: 130/85  HR: 60  SpO2: 98%    Interventions:       Therapeutic Activities:  .       Gait Training:  .    Focus of session on gait training and stair  training in Zero-G dynamic body  weight support system.    TA:  -Pt completed 15 minutes on upright bike at resistance level 3 in order to  improve LE strength, motor control, and coordination. Pt rode for 2.68 miles.  Prolonged seated rest break (10 minutes) and water provided after due to  fatigue.    -Stair training in Zero G: 7% body weight support provided; pt practiced  negotiating 8x4 standard height steps and 4x6 four-inch steps with reciprocal  pattern; pt completed first 4 trials with single UE  support on railing and last  4 trials without UE support.    Gait Training:  -7% body weight support in Zero-G. Large harness donned:  -6 laps of walking comfortable speed; focus on upright posture, increasing  stride length bilaterally  -6 laps of starting/stopping gait abruptly with slight increase in time to  regain balance once stopping  -6 laps of walking at fast speed; no LOB      Pain Reassessment:  Response to Pain Intervention: Slight pain in low back; chronic in nature  Post Intervention Pain Quality:  Tender. Aching.  Patient Reports Post Intervention Pain Level of: 3 out of 10  Pain Acceptable: Yes  Education:    Education Provided: Precautions. Plan of care. Gait. Stair/curb/environmental  barrier negotiation.       Audience: Patient.       Mode: Explanation.  Demonstration.       Response: Verbalized understanding.  Needs practice.    ASSESSMENT  Despite reports of "extreme fatigue" upon arrival, pt was able to complete  therapy session and increase time/distance on bike as well as negotiate multiple  flights of stairs within Zero-G harness system. Within Zero-G, pt was able to  trust himself and take appropriate risks with walking and stair negotiation and  demonstrated increasing gait speeds and step length without LOB. Will continue  utilizing Zero-G to build pt's confidence as he reports to no longer doing the  stairs within his home due to fear of falling.    Activity/Participation Problem List  and Goals: No updates at this time.  Functions/Structures Problem List and Goals: No updates at this time.    PLAN  Treatment Frequency, Duration, and Interventions: Continue Physical Therapy to  achieve goals per previously established Plan of Care.  Development of Plan of Care: There was no change to plan of care today. Patient  and/or family continue to be in agreement with plan of care.  The patient has been instructed to contact our clinic if any questions or  problems should arise.    Visit Number: Today's visit is number  11    Signed by: Bertis Ruddy, PT 02/20/2020 10:00:00 AM

## 2020-02-25 NOTE — Psych (Signed)
NAMEBUBBER ROTHERT  MRN: [id number]  Account: [id number]  Session Start: 02/25/2020 11:00:00 AM  Session Stop: 02/25/2020 11:53:00 AM    Total Treatment Minutes: 53.00 Minutes    Psychology Services  Outpatient Rehabilitation Progress Note    Rehab Diagnosis: TBI  Demographics:            Age: 7Y            Gender: Male    Medications and Allergies: Significant rehabilitation considerations:   NKA  Rehabilitation Precautions/Restrictions:   No rehabilitation precautions.    SUBJECTIVE  Patient Reports: Pt reported the following:  Feeling health anxiety  Feeling self-critical  Feeling low interest and motivation    Suicide Risk Screen: Patient has these primary or secondary behavioral health  diagnoses or complaints: Describes self as mildly depressed. 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  Mood was depressed.  No evidence of  psychosis or delusions.  He explicitly denied SI and HI.  BDI score was by  history 46/64.  BDI on 11/26/2019 was 29.    Interventions:   NPSY INDIVID HLTH INTERV INT   NPSY INDIVID HLTH INTERV ADD    ASSESSMENT    Impressions:  Pt reported the following:  Feeling health anxiety  Feeling self-critical  Feeling low interest and motivation      PLAN  Continued Psychology services are recommended to address: Continue to follow  patient as needed for emotional support, assistance with coping skills.  Recommendations:  Weekly behavioral intervention    Signed by: Volanda Napoleon, Psy.D. 02/25/2020 11:53:00 AM

## 2020-02-25 NOTE — Rehab Progress Note ([facility]) (Signed)
Jonathan [name]  MRN: [id number]  Account: [id number]  Session Start: 02/25/2020 9:02:00 AM  Session Stop: 02/25/2020 9:59:00 AM    Total Treatment Minutes: 57.00 Minutes    Physical Therapy  Outpatient Treatment Note    Medical Diagnosis: Rank Code      Description    Date of Onset    1    S06.2X1   Diffuse traumatic brain injury with loss of      05/15/2019                 consciousness of 30 minutes or less  1    S06.2X1   Diffuse traumatic brain injury with loss of      05/16/2019                 consciousness of 30 minutes or less  Therapy Diagnosis:  Rank Code      Description     Date of Onset    1    Z91.81    History of falling                               05/15/2019  2    R27.8     Other lack of coordination                       05/15/2019  3    R26.8     Other abnormalities of gait and mobility         05/15/2019  Demographics:            Age: 70Y            Gender: Male    Rehabilitation Precautions/Restrictions:   fall risk, pacemaker    SUBJECTIVE  Patient Report: I feel like I am having a panic attack. I?m worried about dying  and leaving my wife with a huge mess at home. I had a nightmare last night,  didn't sleep well, and woke up this morning feeling really off balance and with  a gait disturbance. My wife drove me here because I didn't feel comfortable  driving.  Patient/Caregiver Goals: "To improve my balance and endurance."  "To get rid of the unsteadiness"  "To walk like a normal person"  " To get back to where I was before I discharged from PT."  Pain: Patient currently has pain.  Patient reports a pain level of 3 out of 10. Repositioned patient.    OBJECTIVE  General Observation: Pt received on-time in lobby. Pt ambulated back to gym  without AD, independently, shuffling gait pattern noted; decreased gait speed.  Pt dons mask and glasses for duration of session.  Other/Additional Findings: Noted increased shuffling and staggering this session  with poor posture and difficulty making  turns/sitting down.    Interventions:       Therapeutic Activities:  .       Gait Training:  .    Focus of session on gait training without AD:    TA:  -12 minutes on upright bike at resistance level 3; seated rest break provided  after 10 minutes due to fatigue. Pt biked a total of 1.6 miles. Exercise  completed in order to improve motor control, LE strength, and activity  tolerance.    Gait Training:  -Remainder of session focused on gait training within hallway without AD.  Therapist encouraged trial of SPC to improve balance/confidence though  pt  refused to trial.  -4x2 laps with constant verbal cuing for upright posture, increasing step  length, increasing speed, arm swing.  -Mass practice making 180 and 360 degree turns with slow speed and shuffling  pattern noted; 1 LOB requiring min A to correct.  -Side-stepping over line on floor: 30 x with UE support required on 25% of  trials  -Stepping fwd/bkwd over line on floor: 30 x with UE support required 25% of  trials    Pain Reassessment: Pain was not reassessed as no pain was reported.  Education:    Education Provided: Precautions. Plan of care. Safety. Equipment. Gait. Home  exercise/activity plan.       Audience: Patient.       Mode: Explanation.  Demonstration.       Response: Verbalized understanding.  Needs practice.    ASSESSMENT  Pt presents with worsening balance and gait speed this session which may be a  physical manifestation of patient's heightened anxiety (see subjective above).  Care coordination completed with NP and SLP to discuss barriers to progress in  therapy. Pt does report to regular compliance with home walking program and did  complete walking in pool per therapist's recommendation. Plan to complete next  session in Zero G harness to improve confidence with gait and stair negotiation.      Activity/Participation Problem List and Goals: No updates at this time.  Functions/Structures Problem List and Goals: No updates at this  time.    PLAN  Treatment Frequency, Duration, and Interventions: Continue Physical Therapy to  achieve goals per previously established Plan of Care.  Development of Plan of Care: There was no change to plan of care today. Patient  and/or family continue to be in agreement with plan of care.  The patient has been instructed to contact our clinic if any questions or  problems should arise.    Visit Number: Today's visit is number  12    Signed by: Bertis Ruddy, PT 02/25/2020 10:00:00 AM

## 2020-02-26 NOTE — Rehab Evaluation ([facility]) (Signed)
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 I can't do anything."    Wife reports: "We used your strategy last week of planning before and even  though he changed his mind at the last minute, I think it helped to pick his  clothes out without him having a complete breakdown."  Patient/Caregiver Goals: - To reduce amount of assistance from wife for routine  activities such as driving and managing all aspects of healthcare  - "I want to be able to remember stuff"; despite prompting, patient unable to  generate more specific memory goal  Pain: Patient currently without complaints of pain.    OBJECTIVE  General Observation: Patient arrived to session on time; accompanied by wife.  All parties remained masked for duration of session d/t COVID19 precautions.    Interventions:       Development of Cognitive Skills:  .       Development of Cognitive Skills:  .        LTG 1 (9 sessions from 01/30/2020): Patient will use external cognitive supports  (eg., checklists, electronic reminders, calendar)  to plan and follow through  with at  least 80% of scheduled routine and nonroutine tasks independently.  PROGRESS - (+) follow through with completion of planned physical exercise and  other home activities with use of template developed last session. Wife reports  some positive success in the home environment as stated above using trained  cueing hiearchy and task breakdown strategy when preparing for nonroutine  activities; wife/patient provided with additional education re: collaborative  weekly planning strategies and developement of tracking templates weekly to  assist with patient's planning and follow through of planned tasks. When asked  re: goals for this week, patient unable to generate activities he'd like to  complete and instead states he just wants to stay in bed. When prompted re:  strategies discussed with NP during prior hour re: staying engaged and limiting  time in bed during day time hours, patient states, "  I can't remember, I can't  remember anything." Suspect patient's poor recall from prior session related to  significant internal distractions impacting the encoding process for novel  information. With cueing from patient/wife, patient able to generate list of 3x  strategies for managing engagement in daily activities and limiting about of  time in bed per NP recommendations.    LTG 2 (9 sessions from 01/30/2020): Patietn will generate at least 2-3 solutions  to functional problem solving tasks and/or hypothetical scenarios (eg., planning  a trip, selecting a restaurant and making a dinner reservation) given occasional  verbal cues from SLP for cognitive flexibility. - PROGRESS -  During attempts to  provide patient with options for activities to continue to enhance his physical  and cognitive recovery s/p TBI (eg., DPI adaptive fitness, TBI support group,  Brain injury services), patient begins to disengage and repeteadly state, "I  don't want to do this. I'm stressing out thinking about doing this. I can't do  this." Patient's  participation impacted by negative self talk/catestrophic  thinking requiring frequent SLP/wife redirection, supportive listening, and  encouraegement to continue. Upon education re: rationale for recommended  supports, patient agreeable to write a list of resources available in his  notebook and "think about it."      Pain Reassessment: Pain was not reassessed as no pain was reported.  Education:    Education Provided: Plan of care. Rehab techniques and procedures. Task  breakdown strategies. Cognitive aids. Community resources. TBI. Marland Kitchen       Audience: Patient and significant other.       Mode: Explanation.  Teacher, English as a foreign language provided.       Response: Verbalized understanding.  Needs practice.  Needs reinforcement.    ASSESSMENT  Patient's internal distractions and underlying psychological distress s/p TBI  continue to be a significant barrier to his progress and cognitive engagement in  daily tasks. Care coordination completed with NP and PT to discuss barriers to  progress in therapy. Will plan to discharge from skilled SLP services over next  2-3 sessions; focus on remaining plan of care will be caregiver training,  establishment of home program, and education re: community resources.          Activity/Participation Problem List and Goals: No updates at this time.  Functions/Structures Problem List and Goals: No updates at this time.    PLAN  Treatment Frequency, Duration, and Interventions: Continue Speech/Language  Pathology to achieve goals per previously established Plan of Care.  Development of Plan of Care: There was no change to plan of care today. Patient  and/or family continue to be in agreement with plan of care.  The patient has been instructed to contact our clinic if any questions or  problems should arise.    Visit Number: Today's visit is number  7    Signed by: Cannon Kettle, M.A., CCC/SLP 02/25/2020 12:00:00 PM

## 2020-02-27 NOTE — Rehab Progress Note ([facility]) (Signed)
NAMEOTHAR CURTO  MRN: [id number]  Account: [id number]  Session Start: 02/27/2020 9:02:00 AM  Session Stop: 02/27/2020 9:57:00 AM    Total Treatment Minutes: 55.00 Minutes    Physical Therapy  Outpatient Treatment Note    Medical Diagnosis: Rank Code      Description    Date of Onset    1    S06.2X1   Diffuse traumatic brain injury with loss of      05/15/2019                 consciousness of 30 minutes or less  1    S06.2X1   Diffuse traumatic brain injury with loss of      05/16/2019                 consciousness of 30 minutes or less  Therapy Diagnosis:  Rank Code      Description     Date of Onset    1    Z91.81    History of falling                               05/15/2019  2    R27.8     Other lack of coordination                       05/15/2019  3    R26.8     Other abnormalities of gait and mobility         05/15/2019  Demographics:            Age: 72Y            Gender: Male    Rehabilitation Precautions/Restrictions:   fall risk, pacemaker    SUBJECTIVE  Patient Report: "I did something to my back. I have spinal stenosis and my back  is bothering me the past 2 days. I wasn't able to go for my walk this morning  and sometimes the pain shoots down my right leg so bad it makes me trip."  Patient/Caregiver Goals: "To improve my balance and endurance."  "To get rid of the unsteadiness"  "To walk like a normal person"  " To get back to where I was before I discharged from PT."  Pain: Patient currently has pain.  Patient reports a pain level of 5 out of 10. Repositioned patient.    OBJECTIVE  General Observation: Pt received on-time in lobby. Pt ambulated back to gym  without AD, independently, shuffling gait pattern noted; decreased gait speed.  Pt dons mask and glasses for duration of session.  Other/Additional Findings: BP seated: 113/66  BP standing: 108/73    Interventions:       Therapeutic Activities:  .    Focus of session on LBP management and updating HEP.    TA:  Education on spinal stenosis,  biomechanics of spine, and flexion-based  activities to relieve pressure on spine. Printed and reviewed HEP for LBP  management to include DKTC (5x10 sec holds) and seated fwd flexion (5x10 sec  holds). Pt reports relief in LBP during exercises. Verbal cuing for proper  technique.    Updated strengthening HEP as prone exercises provided previously tend to  exacerbate LBP due to spinal stenosis. Added standing knee flexion for isolated  hamstring strengthening and standing mini squats for glut/quad strengthening.  Verbal cuing for avoiding compensatory hip flexion during hamstring  strengthening exercise and  for placing weight in heels during mini squats; pt  required BUE support to stabilize self; 2x10 completed on each leg.    At end of session, pt completed 10 minutes on upright bike at resistance level  3; pt required seated rest break after 5 minutes. Activity was completed to  improve LE strength, coordination, and activity tolerance in a position that did  not exacerbate LBP.    Pain Reassessment:  Response to Pain Intervention: LBP remained the same as pt ambulated out of  therapy session  Post Intervention Pain Quality:  Tender. Burning.  Patient Reports Post Intervention Pain Level of: 5 out of 10  Pain Acceptable: Yes  Education:    Education Provided: Precautions. Plan of care. Fall prevention/balance training.  Gait. Home exercise/activity plan.       Audience: Patient.       Mode: Explanation.  Demonstration.       Response: Verbalized understanding.  Needs practice.    ASSESSMENT  Increase in LBP noted this session which resulted in poor standing/ambulation  tolerance, decreased gait speed, forward flexed posture. Provided a  flexion-based stretching program and updated strengthening HEP as not to  exacerbate LBP. Plan to discharge from outpatient PT in 2 more visits. Pt  expressed understanding.    Activity/Participation Problem List and Goals: No updates at this time.  Functions/Structures Problem  List and Goals: No updates at this time.    PLAN  Treatment Frequency, Duration, and Interventions: Continue Physical Therapy to  achieve goals per previously established Plan of Care.  Development of Plan of Care: There was no change to plan of care today. Patient  and/or family continue to be in agreement with plan of care.  The patient has been instructed to contact our clinic if any questions or  problems should arise.    Visit Number: Today's visit is number  13    Signed by: Bertis Ruddy, PT 02/27/2020 10:00:00 AM

## 2020-02-27 NOTE — Rehab Evaluation ([facility]) (Signed)
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 I couldn't get out of bed this morning. I  was like no way, not happening."  Patient/Caregiver Goals: - To reduce amount of assistance from wife for routine  activities such as driving and managing all aspects of healthcare  - "I want to be able to remember stuff"; despite prompting, patient unable to  generate more specific memory goal  Pain: Patient currently has pain.  Patient reports a pain level of 5 out of 10. No pain intervention was  facilitated because patient refused.    OBJECTIVE  General Observation: Patient arrived to session on time; appreciate handoff from  PT. +facemask for duration of session d/t COVID19 precautions.    Interventions:       Development of Cognitive Skills:  .       Development of Cognitive Skills:  .    LTG 1 (9 sessions from 01/30/2020): Patient will use external cognitive supports  (eg., checklists, electronic reminders, calendar)  to plan and follow through  with at least 80% of scheduled routine and nonroutine tasks independently.  PROGRESS  - At start of session,  patient encouraged to identify and generate list of  internal distractions that may interefere with patient's sustained attention  this session. Patient generated a list within a 5 minute time frame that took up  about half a page, then promptly shredded the paper. While he states writing out  his internal distractors causes a slight increase in feelings of stress, he felt  momentarily relieved once he threw the paper away and was able to better focus  on discussion with SLP.  - Given patient demonstrates increased follow through when pre-planning  activities and writing out what he's accomplished, patient and SLP  collaboratively developed a weekly planning template in his notebook to log  daily activities and track exercises completed. Will follow up with compliance  during next attended treatment session.    LTG 2 (9 sessions from 01/30/2020): Patietn will generate at  least 2-3 solutions  to functional problem solving tasks and/or hypothetical scenarios (eg., planning  a trip, selecting a restaurant and making a dinner reservation) given occasional  verbal cues from SLP for cognitive flexibility. - PROGRESS -  Patient required  minimal cues for cognitive flexibility to generate a "master list" of activities  that hecan complete independnetly during a day, with goal to reduce amount of  time laying in bed and increase cognitive/physical stimulation for TBI recovery.  SLP provided additional resources for cognitive engagement, such as books and  podcasts related to meditation and brain injury recovery.      Pain Reassessment: Patient denies escalation of pain at end of session.  Education:    Education Provided: Plan of care. Rehab tehcniques and procedures. Task  breakdown strategies. Problem solving/reasoning. Attention strategies. Cognitive  aids. .       Audience: Patient.       Mode: Explanation.       Response: Verbalized understanding.  Needs practice.    ASSESSMENT  Patient and SLP collaboratively developed a  weekly planning template in his  notebook to log daily activities and track exercises completed. Positive  response to written strategies in order to manage internal distractions in  highly structured therapy conext.          Activity/Participation Problem List and Goals: No updates at this time.  Functions/Structures Problem List and Goals: No updates at this time.    PLAN  Treatment Frequency, Duration, and Interventions: Continue Speech/Language  Pathology to achieve goals per previously established Plan of Care.  Development of Plan of Care: There was no change to plan of care today. Patient  and/or family continue to be in agreement with plan of care.  The patient has been instructed to contact our clinic if any questions or  problems should arise.    Visit Number: Today's visit is number  8    Signed by: Cannon Kettle, M.A., CCC/SLP 02/27/2020 11:00:00 AM

## 2020-03-02 ENCOUNTER — Ambulatory Visit: Payer: Medicare Other | Attending: Specialist

## 2020-03-02 ENCOUNTER — Encounter (INDEPENDENT_AMBULATORY_CARE_PROVIDER_SITE_OTHER): Payer: Self-pay | Admitting: Cardiovascular Disease

## 2020-03-02 DIAGNOSIS — R41844 Frontal lobe and executive function deficit: Secondary | ICD-10-CM | POA: Insufficient documentation

## 2020-03-02 DIAGNOSIS — R411 Anterograde amnesia: Secondary | ICD-10-CM | POA: Insufficient documentation

## 2020-03-02 DIAGNOSIS — R278 Other lack of coordination: Secondary | ICD-10-CM | POA: Insufficient documentation

## 2020-03-02 DIAGNOSIS — R41840 Attention and concentration deficit: Secondary | ICD-10-CM | POA: Insufficient documentation

## 2020-03-02 DIAGNOSIS — Z9181 History of falling: Secondary | ICD-10-CM | POA: Insufficient documentation

## 2020-03-03 NOTE — Rehab Progress Note ([facility]) (Signed)
Jonathan [name]  MRN: [id number]  Account: [id number]  Session Start: 03/03/2020 9:03:00 AM  Session Stop: 03/03/2020 9:58:00 AM    Total Treatment Minutes: 55.00 Minutes    Physical Therapy  Outpatient Treatment Note    Medical Diagnosis: Rank Code      Description    Date of Onset    1    S06.2X1   Diffuse traumatic brain injury with loss of      05/15/2019                 consciousness of 30 minutes or less  1    S06.2X1   Diffuse traumatic brain injury with loss of      05/16/2019                 consciousness of 30 minutes or less  Therapy Diagnosis:  Rank Code      Description     Date of Onset    1    Z91.81    History of falling                               05/15/2019  2    R27.8     Other lack of coordination                       05/15/2019  3    R26.8     Other abnormalities of gait and mobility         05/15/2019  Demographics:            Age: 4Y            Gender: Male    Rehabilitation Precautions/Restrictions:   fall risk, pacemaker    SUBJECTIVE  Patient Report: "I've thought about what I'm going to do when I discharge from  therapy and I'm just going to lay in bed. I am less anxious about that."  Pt reports to seeing PCP on Friday of last week; prescribed new  anti-depressant/anti-anxiety medication which pt has been taking x3 days.  Will see an orthopedic MD on Friday of this week due to back pain.  Patient/Caregiver Goals: "To improve my balance and endurance."  "To get rid of the unsteadiness"  "To walk like a normal person"  " To get back to where I was before I discharged from PT."  Pain: Patient currently has pain.  Patient reports a pain level of 6 out of 10. Repositioned patient. Patient  medicated.    OBJECTIVE  General Observation: Pt received on-time in lobby. Pt ambulated back to gym  without AD, independently, shuffling gait pattern noted; decreased gait speed.  Pt dons mask and glasses for duration of session.  Other/Additional Findings: BP: 97/72 upon arrival    Interventions:        Therapeutic Activities:  .       Gait Training:  .    Focus of session on gait and stair training in Zero-G dynamic body weight  system. Pt dons Large harness.    TA:  Stair negotiation in Zero-G at 6% body weight support:  -Pt practiced negotiating 5x4 standard height steps and 5x6 four-inch height  steps with reciprocal pattern; pt initially attempts to complete without UE  support then requires occasional support from HR when descending; when cued to  utilize single HR throughout, improved balance and safety noted.  -Progressed to stair negotiation without  Zero-G harness system: Pt practiced  negotiating 3x4 standard height steps with single HR, reciprocal pattern, and  casual supervision. Pt able to correct mild LOB without therapist physical  assistance.    Gait:  Gait Training in Zero-G system. Provided 6% body weight support throughout:  -3x200 feet of gait with emphasis on increasing step length bilaterally, arm  swing, upright posture, and overall confidence with movement; no LOB noted  during gait; moderate staggering when making 180 degree turns; performance with  turning improved with cuing to "lift feet" vs. shuffle provided.  -Progressed to stepping over orange hurdles: 10 repetitions completed when in  harness and 6 repetitions completed out of harness; no significant change noted  as pt was able to negotiate obstacle without slowing speed and while taking  large steps.      Pain Reassessment:  Response to Pain Intervention: No change in LBP  Post Intervention Pain Quality:  Tender. Aching.  Patient Reports Post Intervention Pain Level of: 6 out of 10  Pain Acceptable: Yes  Education:    Education Provided: Precautions. Plan of care. Safety. Gait.  Stair/curb/environmental barrier negotiation.       Audience: Patient.       Mode: Explanation.  Demonstration.       Response: Verbalized understanding.  Needs practice.    ASSESSMENT  Good session with first part of session completing in Zero-G in order  to improve  confidence with gait, obstacle negotiation, and stair negotiation. Then, harness  was removed and same activities were completed over-ground without a major  change in performance. Pt was able to negotiate stairs in therapy gym with  single HR, reciprocal pattern, and casual supervision. Plan to trial full flight  of stairs next session. Plan to discharge from outpatient PT next session.    Activity/Participation Problem List and Goals: No updates at this time.  Functions/Structures Problem List and Goals: No updates at this time.    PLAN  Treatment Frequency, Duration, and Interventions: Continue Physical Therapy to  achieve goals per previously established Plan of Care.  Development of Plan of Care: There was no change to plan of care today. Patient  and/or family continue to be in agreement with plan of care.  The patient has been instructed to contact our clinic if any questions or  problems should arise.    Visit Number: Today's visit is number  14    Signed by: Bertis Ruddy, PT 03/03/2020 10:00:00 AM

## 2020-03-03 NOTE — Rehab Evaluation ([facility]) (Signed)
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 I think I'm more in  acceptance mode. I'm still sad about the thought about not getting better, but  I'm not as anxious."  Patient/Caregiver Goals: - To reduce amount of assistance from wife for routine  activities such as driving and managing all aspects of healthcare  - "I want to be able to remember stuff"; despite prompting, patient unable to  generate more specific memory goal  Pain: Patient currently has pain.  Patient reports a pain level of 6 out of 10. No pain intervention was  facilitated because patient refused.    OBJECTIVE  General Observation: Patient arrived to session on time; unaccompanied.  +facemask for duration of session d/t COVID19 precautions.    Interventions:       Development of Cognitive Skills:  .Marland Kitchen       Development of Cognitive Skills:  .    LTG 1 (9 sessions from 01/30/2020): Patient will use external cognitive supports  (eg., checklists, electronic reminders, calendar)  to plan and follow through  with at least 80% of scheduled routine and nonroutine tasks  independently.  PROGRESS  - Patient did not follow through with use of weekly planning template  established last session, as he identified a number of barriers including back  pain over the weekend, discomfort due to constipation, and general fatigue.  Patient reports he saw his PCP last Friday and has had recent changes to his  medication, including new anti-depressent/anti-anxiety medication. Patient  visibly stressed throughtout the session repeatedly stating his short term  memory is getting worse, placing his head in his hands, and perseverating on the  fact that he could not recall what his wife ate for dinner last night. SLP  provided extensive eduation re: impact of internal distractions and underlying  emotional distress on his ability to encode and recall daily information, and  reinforced importance of ongoing medical and psychological management of his  presenting symptoms as this will optimize his  cognitive functions. Reviewed and  reinforced external memory strategies which have been trained and proven  effective throughout his course of SLP intervention to date, including use of  lists, pre-planning via use of weekly planning template, and external alarms.  Patient verbalized understanding.        LTG 2 (9 sessions from 01/30/2020): Patient will generate at least 2-3 solutions  to functional problem solving tasks and/or hypothetical scenarios (eg., planning  a trip, selecting a restaurant and making a dinner reservation) given occasional  verbal cues from SLP for cognitive flexibility. - PROGRESS -  Required at least  moderate verbal cues for cognitive flexibility to generate next steps for home  exercise and follow through of trained strategies upon discharge from SLP.  Viewed 5-minute video re: introduction to meditation and provided with  additional resources; encouraged to follow up with neuropsychologist re: active  coping and adjustment strategies. Patient continues to be limited by  negative  self-talk and catastrophic thinking when attempting to engage in structured  therapy tasks.      Pain Reassessment: No reported escalation of symptoms throughout session.  Education:    Education Provided: Plan of care. Rehab techniques and procedures. Attention  strategies. Cognitive aids. .       Audience: Patient.       Mode: Explanation.       Response: Verbalized understanding.  Needs practice.    ASSESSMENT  Patient's internal distractions and underlying emotional distress continue to  impact his progress and response to skilled SLP intervention. Patient continues  with neuropsychology and had a recent medication change to a new  anti-depressant/anti-anxiety medication. Will plan to discharge from SLP next  session.          Activity/Participation Problem List and Goals: No updates at this time.  Functions/Structures Problem List and Goals: No updates at this time.    PLAN  Treatment Frequency, Duration, and Interventions: Continue Speech/Language  Pathology to achieve goals per previously established Plan of Care.  Development of Plan of Care: There was no change to plan of care today. Patient  and/or family continue to be in agreement with plan of care.  The patient has been instructed to contact our clinic if any questions or  problems should arise.    Visit Number: Today's visit is number  9    Signed by: Cannon Kettle, M.A., CCC/SLP 03/03/2020 11:00:00 AM

## 2020-03-04 NOTE — Psych (Signed)
Jonathan [name]  MRN: [id number]  Account: [id number]  Session Start: 03/03/2020 11:00:00 AM  Session Stop: 03/03/2020 11:53:00 AM    Total Treatment Minutes: 53.00 Minutes    Psychology Services  Outpatient Rehabilitation Progress Note    Rehab Diagnosis: TBI  Demographics:            Age: 67Y            Gender: Male    Medications and Allergies: Significant rehabilitation considerations:   NKA  Rehabilitation Precautions/Restrictions:   No rehabilitation precautions.    SUBJECTIVE  Patient Reports: Pt reported the following:  Feeling depressed  Feeling discouraged  Feeling pessimistic  Feeling less anxious    Suicide Risk Screen: Patient has these primary or secondary behavioral health  diagnoses or complaints: Describes self as mildly depressed. 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  Mood was depressed.  No evidence of  psychosis or delusions.  He explicitly denied SI and HI.  BDI score was by  history 46/64.  BDI on 11/26/2019 was 29.    Interventions:   NPSY INDIVID HLTH INTERV INT   NPSY INDIVID HLTH INTERV ADD    ASSESSMENT    Impressions:  Pt reported the following:  Feeling depressed  Feeling discouraged  Feeling pessimistic  Feeling less anxious    PLAN  Continued Psychology services are recommended to address: Continue to follow  patient as needed for emotional support, assistance with coping skills.  Recommendations:  Weekly behavioral intervention    Signed by: Volanda Napoleon, Psy.D. 03/03/2020 11:53:00 AM

## 2020-03-05 NOTE — Psych (Signed)
[name]  MRN: [id number]  Account: [id number]  Session Start: 03/05/2020 11:00:00 AM  Session Stop: 03/05/2020 11:53:00 AM    Total Treatment Minutes: 53.00 Minutes    Psychology Services  Outpatient Rehabilitation Progress Note    Rehab Diagnosis: TBI  Demographics:            Age: 34Y            Gender: Male    Medications and Allergies: Significant rehabilitation considerations:   NKA  Rehabilitation Precautions/Restrictions:   No rehabilitation precautions.    SUBJECTIVE  Patient Reports: Pt reported the following:  Feeling afraid of failure  Feeling avoidant of failure  Feeling inhibited  Feeling depressed    Suicide Risk Screen: Patient has these primary or secondary behavioral health  diagnoses or complaints: Describes self as mildly depressed. 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  Mood was depressed.  No evidence of  psychosis or delusions.  He explicitly denied SI and HI.  BDI score was by  history 46/64.  BDI on 11/26/2019 was 29.    Interventions:   NPSY INDIVID HLTH INTERV INT   NPSY INDIVID HLTH INTERV ADD    ASSESSMENT    Impressions:  Pt reported the following:  Feeling afraid of failure  Feeling avoidant of failure  Feeling inhibited  Feeling depressed      PLAN  Continued Psychology services are recommended to address: Continue to follow  patient as needed for emotional support, assistance with coping skills.  Recommendations:  Weekly behavioral intervention    Signed by: Volanda Napoleon, Psy.D. 03/05/2020 11:53:00 AM

## 2020-03-05 NOTE — Discharge Summary (Signed)
Jonathan [name]  MRN: [id number]  Account: [id number]  Session Start: 03/05/2020 10:02:00 AM  Session Stop: 03/05/2020 10:57:00 AM    Total Treatment Minutes: 55.00 Minutes    Physical Therapy  Outpatient Discharge Summary    Medical Diagnosis: Rank Code      Description    Date of Onset    1    S06.2X1   Diffuse traumatic brain injury with loss of      05/15/2019                 consciousness of 30 minutes or less  1    S06.2X1   Diffuse traumatic brain injury with loss of      05/16/2019                 consciousness of 30 minutes or less  Therapy Diagnosis:  Rank Code      Description     Date of Onset    1    Z91.81    History of falling                               05/15/2019  2    R27.8     Other lack of coordination                       05/15/2019  3    R26.8     Other abnormalities of gait and mobility         05/15/2019  Demographics:            Age: 77Y            Gender: Male    Rehabilitation Precautions/Restrictions:   fall risk, pacemaker    Initial Evaluation Date: 05/15/2019 10:00:00 AM  Reporting Period: 01/23/20 - 03/05/20  Visit Number: Today's visit is number  15    SUBJECTIVE  Patient Report: Physically, I am feeling better but mentally, I'm having a hard  time. I feel like I'm failing at rehab and everyone knows it.  Patient/Caregiver Goals: "To improve my balance and endurance."  "To get rid of the unsteadiness"  "To walk like a normal person"  " To get back to where I was before I discharged from PT."  Pain: Patient currently has pain.  Patient reports a pain level of 2 out of 10. No pain intervention was  facilitated because patient refused.    OBJECTIVE  General Observation: Pt received on-time in lobby. Pt ambulated back to gym  without AD, independently, shuffling gait pattern noted; decreased gait speed.  Pt dons mask and glasses for duration of session.  Special Tests: See below.    Vital Signs:  BP: 111/73  HR: 60  SpO2: 100%    MMT: (muscles in BOLD indicate an improvement)  Hip Flexion:  L: 4+  R: 4+  Hip Extension: L: 3+  R: 4  Hip abduction: L: 4  R: 5  Knee flexion: L: 4  R: 5  Knee extension: L: 5  R: 5  Ankle DF: L: 4  R: 5    FGA:  03/05/20: 21/30  01/23/20:18/30  10/24/19: 17/30  07/24/19: 28/30    5xsit<-->stand:  03/05/20: 29 seconds without UE support from standard chair; some retropulsion  01/23/20: unable to complete test; can only complete 1 repetition without UE  support    10 meter walk test:  Self-selected speed:  03/05/20: Avg: 11.65 seconds; speed: 0.86 m/s  01/23/20: Avg: 11.52 seconds; Speed: 0.87 m/s  10/24/19: Avg: 0.75 m/s  07/24/19: Avg: 1.07 m/s    Fast speed:  03/05/20: Avg: 7.46 seconds; Speed; 1.34 m/s  01/23/20: Avg: 7.38 seconds; Speed: 1.35 m/s  10/24/19: Avg: 1.14 m/s  07/24/19: 1.57 m/s    6 minute walk test:  01/30/20: 1,231 feet  10/24/19: 977 feet  07/24/19:1,504 feet      Functional Gait Assessment:       GAIT LEVEL SURFACE: Mild impairment - walks 31ft, 7< >5.5 sec, assistive  device, slower speed, mild gait deviations or deviates 6-10in outside 12in  walkway width       CHANGE IN GAIT SPEED: Normal - able to smoothly change walking speed w/o  loss of balance or gait deviation. Significant difference in walking speeds  normal/fast/slow. Deviates no more than 6in outside 12in wlkwy       GAIT WITH HORIZONTAL HEAD TURNS: Mild impairment - performs head turns  smoothly with slight change in gait velocity, deviate 6-10in outside 12in  walkway, or uses assistive device       GAIT WITH VERTICAL HEAD TURNS: Normal - performs with no change in gait,  deviates no more than 6in outside 12in walkway       GAIT AND PIVOT TURN: Moderate impairment - turns slowly, requires verbal  cueing, or requires several small steps to catch balance following turn and stop         STEP OVER OBSTACLE: Normal - is able to step over 2 stacked shoe boxes  taped together w/o changing gait speed; no evidence of imbalance       GAIT WITH NARROW BASE OF SUPPORT: Mild impairment - ambulates heel/toe 7-9  steps        GAIT WITH EYES CLOSED: Moderate impairment - walks 19ft, slow speed,  abnormal gait pattern, evidence for imbalance, deviates 10-15in outside 12in  walkway. Require more than 9 sec to ambulate 20 ft       AMBULATING BACKWARDS: Mild impairment - walks 56ft using assistive device,  slower speed, mild gait deviations, deviate 6-10in outside 12in walkway       STEPS: Mild impairment - alternating feet, must use rail  TOTAL SCORE: 21 /30      Functional Mobility:            Bed Mobility: All bed mobility including supine to sit, rolling,  scooting. with independence.            Transfers: Patient transferred bed to/from chair with independence.            Locomotion/Wheelchair: Not assessed.            Locomotion/Gait: Patient was independent with gait/ambulation for 200  feet; no AD; slow speed; decreased step length; forward flexed posture;  occasional staggering when making turns . No assistive devices were required.            Stairs: Patient was modified independent for 16 steps in lobby;  reciprocal pattern; single HR . Patient used the following equipment: Unilateral  Railing.    Interventions:       Physical Performance Test with Report: Focus of session on re-assessment of  outcome measures, discussion of progress in PT, and plan of care.  Provided  printed hand-out for recommendation for continued exercise and community  resources. All questions addressed.    Recommendations for continued exercise:  1. Leg strengthening, back stretches: refer to printed handouts; 3-4x/week  2. Going for walk around neighborhood (avoid if wet or icy): daily if weather  cooperates; the most important thing is to be in control of your balance; if you  are leaning forward then you are going too fast. If your back pain is worse or  you feel extremely fatigued or off-balance, consider using the upright bike that  day or going with a family member.  Don't worry as much about your speed; focus  on good posture, taking big  steps.  3. Biking on upright bike: 3-4x/week  a. starting with 10 minutes; light-moderate resistance; take seated rest breaks  as needed  b. Gradually increase the time by 10% each week if tolerated  4. Swimming: With someone/holding hand when walking on slippery pool deck;  utilize stairs and railing to enter/exit the pool; can walk in the pool or swim  5. [employer] [facility] in [place] in [place], [place]:  1:1 classes, group  classes, personal training: [email or web address]      Pain Reassessment:  Response to Pain Intervention: 2/10 low back pain  Post Intervention Pain Quality:  Aching. Tender.  Patient Reports Post Intervention Pain Level of: 2 out of 10  Pain Acceptable: Yes  Education:    Education Provided: Precautions. Plan of care. Functional transfers. Fall  prevention/balance training. Equipment. Gait. Home exercise/activity plan.  Community resources. Stair/curb/environmental barrier negotiation.       Audience: Patient.       Mode: Explanation.  Demonstration.  Teacher, English as a foreign language provided.       Response: Verbalized understanding.  Demonstrated skill.    ASSESSMENT  Clinical Status Changes: Patient has not experienced significant, unusual or  unexpected change in clinical status since their last visit.    Activity/Participation Problem List and Goals: Other  Goal: LTG: 10 visits from 01/23/20: Pt will improve FGA score from 17/30 to 24/30  to demonstrate clinically significant improvement in dynamic balance and  decrease falls risk.  PARTIALLY MET    LTG: 10 visits from 01/23/20: Pt will improve self-selected gait speed from 0.75  m/s to 1.0 m/s to demonstrate a decreased falls risk and clinically significant  improvement in gait speed.  NOT MET    LTG: 10 visits from 01/23/20: Pt will demonstrate a 20% improvement in 6 minute  walk test to demonstrate improved ambulation endurance and allow pt to safely  ambulate to doctors and therapy appointments.  NOT RE-ASSESSED due to time  restraints    LTG: 10 visits from 01/23/20: Pt will negotiate 1 flight of stairs with single HR  and reciprocal pattern with mod I to allow pt to safely negotiate the stairs  within his home.  GOAL MET  Goal Status: Resolved -  Status: Pt met 1/3 long term goals related to improving gait and balance. Six  minute walk test was not re-assessed due to time restraints. Pt was encouraged  to utilize handrail when negotiating stairs within the home. Pt demonstrated a  3-point improvement in FGA score indicating an improvement in dynamic balance  though did not achieve score of 24/30.    Functions/Structures Problem List and Goals: Other: LTG: 10 visits from 01/23/20:  Pt will improve L hip extension MMT from 3/5 to 3+/5 in order to improve  ambulation tolerance and safety with stair negotiation  GOAL MET    LTG: 10 visits from 01/23/20: Pt will be independent and compliant with HEP to  improve LLE strength and dynamic balance to reduce falls risk within the home.  GOAL  MET  Goal Status: Resolved  Status: 2/2 long term goals met; pt demonstrated improvements in L hip extension  strength as well as independence with provided HEP.    Progress Summary: During this reporting period, Jonathan [name] demonstrated nice  improvements in LLE strength as indicated by MMT and improvement in 5x  sit<-->stand test above. He demonstrated a modest improvement in FGA score  indicating some improvement in dynamic balance though continues to fall below  the cut-off score and is currently at an increased falls risk. Pt was able to  negotiate 2 flights of stairs using single HR and reciprocal pattern with mod I  and this therapist encouraged pt to utilize the stairs within his home. Biggest  barrier to progress at this time is depression, fatigue, and the amount of time  that patient spends laying in bed when not in therapy. Encouraged pt to  follow-up with Neurologist due to new challenges with motor control  (retropulsion, difficulty making turns). Pt  has an appointment with the  Neurologist at the end of August, 2021. Therapist provided community resources  for [employer] though pt uninterested at this time. Therapist  encouraged use of upright bike at community gym or via self-purchase in order to  improve strength, motor control, and endurance while not exacerbating low back  pain. Will discharge from outpatient PT at this time. Recommend pt return in 6  months for re-assessment if pt has specific goals related to improving gait and  balance.    Equipment Provided/Recommended: No equipment was issued or recommended.  Recommendations: Physical Therapy services are discontinued at this time  secondary to: No need for skilled therapy intervention at this time. Patient is  independent (able to return demonstrate) home exercise program.    Signed by: Bertis Ruddy, PT 03/05/2020 11:00:00 AM

## 2020-03-08 ENCOUNTER — Encounter (INDEPENDENT_AMBULATORY_CARE_PROVIDER_SITE_OTHER): Payer: Self-pay

## 2020-03-09 ENCOUNTER — Encounter (INDEPENDENT_AMBULATORY_CARE_PROVIDER_SITE_OTHER): Payer: Self-pay

## 2020-03-09 NOTE — Discharge Summary (Signed)
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 And I think  having my wife here was helpful. I have a feeling this new medication is going  to change the way I look at stuff. I'm less visibly anxious, but I'm not sure  I'm less internally anxious. Lists are helpful. Breathing can be helpful. That's  what I remember."  Patient/Caregiver Goals: - To reduce amount of assistance from wife for routine  activities such as driving and managing all aspects of healthcare  - "I want to be able to remember stuff"; despite prompting, patient unable to  generate more specific memory goal  Pain: Patient currently without complaints of pain.    OBJECTIVE  General Observation: Patient arrived to session on time; unaccompanied.  +facemask for duration of session d/t COVID19 precautions.    Interventions:       Development of Cognitive Skills:  .       Development of Cognitive Skills:  .    Session focus on reviewing progress to date, education  re: external cognitive  supports trained throughout this plan of care, impact of underlying emotional  distress on cognitive functioning, and community resources to support cognitive  and physical stimulation for TBI recovery upon discharge from skilled outpatient  services. Patient continues to express his feelings of worry; SLP provided  supportive listening and encouragement. SLP initiated deep breathing techniques  within session as patient began to experience feelings of being overwhelmed, to  which patient responded positively. Assisted patient in downloading the  Breathing app providing visual cues for controlled breathing. Multiple resources  provided to patient for meditation; encourged to discuss with his wife and  neuropsychologist for continued support.    Pain Reassessment: Pain was not reassessed as no pain was reported.  Equipment Provided/Recommended: No equipment was issued or recommended.  Education:    Education Provided: Discharge  recommendations. Community resources. .       Audience: Patient.       Mode: Explanation.  Teacher, English as a foreign language provided.       Response: Verbalized understanding.    ASSESSMENT  Clinical Status Changes: Patient has not experienced significant, unusual or  unexpected change in clinical status since their last visit.    Activity/Participation Problem List and Goals: Other  Goal: .  Goal Status: Unresolved -  Status:  LTG 1 (9 sessions from 01/30/2020): Patient will use external cognitive supports  (eg., checklists, electronic reminders, calendar)  to plan and follow through  with at least 80% of scheduled routine and nonroutine tasks independently.  PROGRESS REALIZED - Requires set up assistance and at least minimal cues for  follow through/accountability in use of the following cognitive supports: use of  notebook for tracking logs and to do lists, weekly planning template, shared  electronic calendar with wife for upcoming events/appointments, and electronic  reminders for  time sensitive activities    Functions/Structures Problem List and Goals:  Other: Marland Kitchen  Goal Status: Unresolved  Status:  LTG 2 (9 sessions from 01/30/2020): Patietn will generate at least 2-3 solutions  to functional problem solving tasks and/or hypothetical scenarios (eg., planning  a trip, selecting a restaurant and making a dinner reservation) given occasional  verbal cues from SLP for cognitive flexibility.  PROGRESS REALIZED. Requires at least min-moderate cues for cognitive flexibility  and problem solving to generate a solution incorporating multiple variables.  Fair benefit from use of goal-plan-do-review framework to support plan  generation and task breakdown strategy. Problem solving abilities significantly  impacted by negative self talk and catastrophic thinking patterns.      Progress Summary: Jonathan [name] has made fair progress in response to SLP  intervention throughout this episode of care. At the time of discharge, he  requires at least min-moderate verbal cues/scaffolding to support problem  solving and cognitive flexibility to generate alternative solutions to  functional, daily problems. He is inconsistent in his follow through with use of  cognitive aids identified to be beneficial throughout course of therapy (eg.,  goal-plan-do-review, tracking logs, weekly planning templates). Patient's  internal distractions, negative selft-talk/catastrophic thinking, and underlying  psychological distress s/p TBI continue to be a significant barrier to his  progress and cognitive engagement in daily tasks. Strongly recommend patient  continue with neuropsychology and closely monitor his response with his medical  team to recent changes to a new anti-depressant/anti-anxiety medication.      Recommendations: Discharge from skilled SLP at this time. In the absence of a  new medical/neurological event, patient is strongly encouraged to engage in the  following prior to returning for skilled cognitive-linguistic  evaluation/therapy  with SLP:  - consistently completing routine home and community activities using trained  strategies to date and assistance from wife as needed  - consistent engagement in PT/SLP home exercise program  - investigate community resources including [employer] program, [facility]roke  [employer] (cognitive groups), and [facility]; patient has been  provided with the contact information of our clinic's Child psychotherapist to assist  with community resources as needed (patient has declined meeting with her in the  past)  - good recall and implementation of coping strategies as advised by  neuropsychologist to manage internal distractions and feelings of helplessness  related to his TBI reocvery  - consider specific goals he'd like to achieve with the assistance of SLP  Patient was provided with a written resource re:  all outlined recommendations  and verbalized understanding of all information provided.    Signed by: Cannon Kettle, M.A., CCC/SLP 03/05/2020 10:00:00 AM

## 2020-03-10 ENCOUNTER — Inpatient Hospital Stay
Admission: RE | Admit: 2020-03-10 | Discharge: 2020-03-10 | Disposition: A | Discharge status: Home / Self Care | Payer: Medicare Other | Source: Ambulatory Visit | Attending: Specialist | Admitting: Specialist

## 2020-03-10 DIAGNOSIS — I482 Chronic atrial fibrillation, unspecified: Secondary | ICD-10-CM | POA: Insufficient documentation

## 2020-03-10 LAB — PT AND APTT
PT INR: 2.8 — ABNORMAL HIGH (ref 0.9–1.1)
PT: 31.8 s — ABNORMAL HIGH (ref 10.1–12.9)
PTT: 39 s (ref 27–39)

## 2020-03-11 NOTE — Psych (Signed)
Jonathan [name]  MRN: [id number]  Account: [id number]  Session Start: 03/10/2020 11:00:00 AM  Session Stop: 03/10/2020 11:53:00 AM    Total Treatment Minutes: 53.00 Minutes    Psychology Services  Outpatient Rehabilitation Progress Note    Rehab Diagnosis: TBI  Demographics:            Age: 8Y            Gender: Male    Medications and Allergies: Significant rehabilitation considerations:   NKA  Rehabilitation Precautions/Restrictions:   No rehabilitation precautions.    SUBJECTIVE  Patient Reports: Pt reported the following:  Feeling low motivation  Impulsivley stopped all anti-depressant and mood supportive medications  Agreed to immediate consult with doctor about his medication changes    Suicide Risk Screen: Patient has these primary or secondary behavioral health  diagnoses or complaints: Describes self as mildly depressed. 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  Mood was depressed.  No evidence of  psychosis or delusions.  He explicitly denied SI and HI.  BDI score was by  history 46/64.  BDI on 11/26/2019 was 29.    Interventions:   NPSY INDIVID HLTH INTERV INT   NPSY INDIVID HLTH INTERV ADD    ASSESSMENT    Impressions:  Pt reported the following:  Feeling low motivation  Impulsivley stopped all anti-depressant and mood supportive medications  Agreed to immediate consult with doctor about his medication changes      PLAN  Continued Psychology services are recommended to address: Continue to follow  patient as needed for emotional support, assistance with coping skills.  Recommendations:  Weekly behavioral intervention    Signed by: Volanda Napoleon, Psy.D. 03/10/2020 11:53:00 AM

## 2020-03-12 NOTE — Psych (Signed)
NAMEJOHARI [name]  MRN: [id number]  Account: [id number]  Session Start: 03/12/2020 11:00:00 AM  Session Stop: 03/12/2020 11:53:00 AM    Total Treatment Minutes: 53.00 Minutes    Psychology Services  Outpatient Rehabilitation Progress Note    Rehab Diagnosis: TBI  Demographics:            Age: 28Y            Gender: Male    Medications and Allergies: Significant rehabilitation considerations:   NKA  Rehabilitation Precautions/Restrictions:   No rehabilitation precautions.    SUBJECTIVE  Patient Reports: Pt reported the following:  Feeling depressed  Restarted anti-depressant medication  Feeling anxious about feeling anxious    Suicide Risk Screen: Patient has these primary or secondary behavioral health  diagnoses or complaints: Describes self as mildly depressed. Depression related  to medical siutation Denied SI and hI    OBJECTIVE  General Observation: Arrived on time.  Mood was depressed.  No evidence of  psychosis or delusions.  He explicitly denied SI and HI.  BDI score was by  history 46/64.  BDI on 11/26/2019 was 29.    Interventions:   NPSY INDIVID HLTH INTERV INT   NPSY INDIVID HLTH INTERV ADD    ASSESSMENT    Impressions:  Pt reported the following:  Feeling depressed  Restarted anti-depressant medication  Feeling anxious about feeling anxious      PLAN  Continued Psychology services are recommended to address: Continue to follow  patient as needed for emotional support, assistance with coping skills.  Recommendations:  Weekly behavioral intervention    Signed by: Volanda Napoleon, Psy.D. 03/12/2020 11:53:00 AM

## 2020-03-25 NOTE — Psych (Signed)
NAMEARIHAAN [name]  MRN: [id number]  Account: [id number]  Session Start: 03/24/2020 11:00:00 AM  Session Stop: 03/24/2020 11:53:00 AM    Total Treatment Minutes: 53.00 Minutes    Psychology Services  Outpatient Rehabilitation Progress Note    Rehab Diagnosis: TBI  Demographics:            Age: 74Y            Gender: Male    Medications and Allergies: Significant rehabilitation considerations:   NKA  Rehabilitation Precautions/Restrictions:   No rehabilitation precautions.    SUBJECTIVE  Patient Reports: Pt reported the following:  Feeling depressed  Feeling despair  Feeling lack of motivation  Feeling anxious    Suicide Risk Screen: Patient has these primary or secondary behavioral health  diagnoses or complaints: Describes self as mildly depressed. 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  Mood was depressed.  No evidence of  psychosis or delusions.  He explicitly denied SI and HI.  BDI score was by  history 46/64.  BDI on 11/26/2019 was 29.    Interventions:   NPSY INDIVID HLTH INTERV INT   NPSY INDIVID HLTH INTERV ADD    ASSESSMENT    Impressions:  Pt reported the following:  Feeling depressed  Feeling despair  Feeling lack of motivation  Feeling anxious      PLAN  Continued Psychology services are recommended to address: Continue to follow  patient as needed for emotional support, assistance with coping skills.  Recommendations:  Weekly beahvioral intervention    Signed by: Volanda Napoleon, Psy.D. 03/24/2020 11:53:00 AM

## 2020-03-27 NOTE — Psych (Signed)
[name]  MRN: [id number]  Account: [id number]  Session Start: 03/26/2020 11:00:00 AM  Session Stop: 03/26/2020 11:53:00 AM    Total Treatment Minutes: 53.00 Minutes    Psychology Services  Outpatient Rehabilitation Progress Note    Rehab Diagnosis: TBI  Demographics:            Age: 36Y            Gender: Male    Medications and Allergies: Significant rehabilitation considerations:   NKA  Rehabilitation Precautions/Restrictions:   No rehabilitation precautions.    SUBJECTIVE  Patient Reports: Pt reported the following:  Feeling less depressed  Fearing a break down  Fearing humiliation    Suicide Risk Screen: Patient has these primary or secondary behavioral health  diagnoses or complaints: Describes self as mildly depressed. 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  Mood was depressed.  No evidence of  psychosis or delusions.  He explicitly denied SI and HI.  BDI score was by  history 46/64.  BDI on 11/26/2019 was 29.    Interventions:   NPSY INDIVID HLTH INTERV INT   NPSY INDIVID HLTH INTERV ADD    ASSESSMENT    Impressions:  Pt reported the following:  Feeling less depressed  Fearing a break down  Fearing humiliation      PLAN  Continued Psychology services are recommended to address: Continue to follow  patient as needed for emotional support, assistance with coping skills.  Recommendations:  Weekly behavioral intervention    Signed by: Jonathan [name], Psy.D. 03/26/2020 11:53:00 AM

## 2020-04-01 ENCOUNTER — Ambulatory Visit: Payer: Medicare Other | Attending: Specialist

## 2020-04-01 DIAGNOSIS — R41840 Attention and concentration deficit: Secondary | ICD-10-CM | POA: Insufficient documentation

## 2020-04-01 DIAGNOSIS — R278 Other lack of coordination: Secondary | ICD-10-CM | POA: Insufficient documentation

## 2020-04-01 DIAGNOSIS — R411 Anterograde amnesia: Secondary | ICD-10-CM | POA: Insufficient documentation

## 2020-04-01 DIAGNOSIS — R41844 Frontal lobe and executive function deficit: Secondary | ICD-10-CM | POA: Insufficient documentation

## 2020-04-01 DIAGNOSIS — Z9181 History of falling: Secondary | ICD-10-CM | POA: Insufficient documentation

## 2020-04-01 NOTE — Psych (Signed)
NAMEJENNIE MCNAIR  MRN: [id number]  Account: [id number]  Session Start: 03/31/2020 11:00:00 AM  Session Stop: 03/31/2020 11:53:00 AM    Total Treatment Minutes: 53.00 Minutes    Psychology Services  Outpatient Rehabilitation Progress Note    Rehab Diagnosis: TBI  Demographics:            Age: 73Y            Gender: Male    Medications and Allergies: Significant rehabilitation considerations:   NKA  Rehabilitation Precautions/Restrictions:   No rehabilitation precautions.    SUBJECTIVE  Patient Reports: Pt reported the following:  Feeling anxious  Feeling morning anxiety  Feeling self-critical    Suicide Risk Screen: Patient has these primary or secondary behavioral health  diagnoses or complaints: Describes self as mildly depressed. 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  Mood was depressed.  No evidence of  psychosis or delusions.  He explicitly denied SI and HI.  BDI score was by  history 46/64.  BDI on 11/26/2019 was 29.    Interventions:   NPSY INDIVID HLTH INTERV INT 30MIN   NPSY INDIVID HLTH INTERV ADD 15MIN    ASSESSMENT    Impressions:  Pt reported the following:  Feeling anxious  Feeling morning anxiety  Feeling self-critical      PLAN  Continued Psychology services are recommended to address: Continue to follow  patient as needed for emotional support, assistance with coping skills.  Recommendations:  Weekly behavioral intervention    Signed by: Kyra Searles, Psy.D. 03/31/2020 11:53:00 AM

## 2020-04-02 NOTE — Psych (Signed)
NAMETRUNG WENZL  MRN: [id number]  Account: [id number]  Session Start: 04/02/2020 11:00:00 AM  Session Stop: 04/02/2020 11:53:00 AM    Total Treatment Minutes: 53.00 Minutes    Psychology Services  Outpatient Rehabilitation Progress Note    Rehab Diagnosis: TBI  Demographics:            Age: 63Y            Gender: Male    Medications and Allergies: Significant rehabilitation considerations:   NKA  Rehabilitation Precautions/Restrictions:   No rehabilitation precautions.    SUBJECTIVE  Patient Reports: Pt reported the following:  Referential ideation  Feeling shame  Feeling anxiety    Suicide Risk Screen: Patient has these primary or secondary behavioral health  diagnoses or complaints: Describes self as mildly depressed. 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  Mood was depressed.  No evidence of  psychosis or delusions.  He explicitly denied SI and HI.  BDI score was by  history 46/64.  BDI on 11/26/2019 was 29.    Interventions:   NPSY INDIVID HLTH INTERV INT   NPSY INDIVID HLTH INTERV ADD    ASSESSMENT    Impressions:  Pt reported the following:  Referential ideation  Feeling shame  Feeling anxiety      PLAN  Continued Psychology services are recommended to address: Continue to follow  patient as needed for emotional support, assistance with coping skills.  Recommendations:  Weekly behavioral intervention    Signed by: Volanda Napoleon, Psy.D. 04/02/2020 11:53:00 AM

## 2020-04-07 ENCOUNTER — Inpatient Hospital Stay
Admission: RE | Admit: 2020-04-07 | Discharge: 2020-04-07 | Disposition: A | Discharge status: Home / Self Care | Payer: Medicare Other | Source: Ambulatory Visit | Attending: Specialist | Admitting: Specialist

## 2020-04-07 DIAGNOSIS — I482 Chronic atrial fibrillation, unspecified: Secondary | ICD-10-CM | POA: Insufficient documentation

## 2020-04-07 LAB — PT AND APTT
PT INR: 3 — ABNORMAL HIGH (ref 0.9–1.1)
PT: 33.5 s — ABNORMAL HIGH (ref 10.1–12.9)
PTT: 43 s — ABNORMAL HIGH (ref 27–39)

## 2020-04-08 ENCOUNTER — Encounter (INDEPENDENT_AMBULATORY_CARE_PROVIDER_SITE_OTHER): Payer: Self-pay

## 2020-04-09 ENCOUNTER — Encounter (INDEPENDENT_AMBULATORY_CARE_PROVIDER_SITE_OTHER): Payer: Self-pay

## 2020-04-10 NOTE — Psych (Signed)
NAMERODRICKUS MIN  MRN: [id number]  Account: [id number]  Session Start: 04/09/2020 11:00:00 AM  Session Stop: 04/09/2020 11:53:00 AM    Total Treatment Minutes: 53.00 Minutes    Psychology Services  Outpatient Rehabilitation Progress Note    Rehab Diagnosis: TBI  Demographics:            Age: 80Y            Gender: Male    Medications and Allergies: Significant rehabilitation considerations:   NKA  Rehabilitation Precautions/Restrictions:   No rehabilitation precautions.    SUBJECTIVE  Patient Reports: Pt reported the following:  Feeling discouraged  Feeling low motivation  Feeling self-critical    Suicide Risk Screen: Patient has these primary or secondary behavioral health  diagnoses or complaints: Describes self as mildly depressed. 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  Mood was depressed.  No evidence of  psychosis or delusions.  He explicitly denied SI and HI.  BDI score was by  history 46/64.  BDI on 11/26/2019 was 29.    Interventions:   NPSY INDIVID HLTH INTERV INT   NPSY INDIVID HLTH INTERV ADD    ASSESSMENT    Impressions:  Pt reported the following:  Feeling discouraged  Feeling low motivation  Feeling self-critical      PLAN  Continued Psychology services are recommended to address: Continue to follow  patient as needed for emotional support, assistance with coping skills.  Recommendations:  Weekly behavioral intervention    Signed by: Volanda Napoleon, Psy.D. 04/09/2020 11:53:00 AM

## 2020-04-16 NOTE — Psych (Signed)
Jonathan [name]  MRN: [id number]  Account: [id number]  Session Start: 04/16/2020 11:00:00 AM  Session Stop: 04/16/2020 11:53:00 AM    Total Treatment Minutes: 53.00 Minutes    Psychology Services  Outpatient Rehabilitation Progress Note    Rehab Diagnosis: TBI  Demographics:            Age: 58Y            Gender: Male    Medications and Allergies: Significant rehabilitation considerations:   NKA  Rehabilitation Precautions/Restrictions:   No rehabilitation precautions.    SUBJECTIVE  Patient Reports: Pt reported the following:  Feeling anxious  Feeling unable to change  Feeling phoic avoidance of exercise    Suicide Risk Screen: Patient has these primary or secondary behavioral health  diagnoses or complaints: Describes self as mildly depressed. 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  Mood was depressed.  No evidence of  psychosis or delusions.  He explicitly denied SI and HI.  BDI score was by  history 46/64.  BDI on 11/26/2019 was 29.    Interventions:   NPSY INDIVID HLTH INTERV INT   NPSY INDIVID HLTH INTERV ADD    ASSESSMENT    Impressions:  Pt reported the following:  Feeling anxious  Feeling unable to change  Feeling phoic avoidance of exercise    PLAN  Continued Psychology services are recommended to address: Continue to follow  patient as needed for emotional support, assistance with coping skills.  Recommendations:  Weekly behavioral intervention    Signed by: Volanda Napoleon, Psy.D. 04/16/2020 11:53:00 AM

## 2020-04-22 ENCOUNTER — Ambulatory Visit (INDEPENDENT_AMBULATORY_CARE_PROVIDER_SITE_OTHER): Payer: Medicare Other

## 2020-04-22 ENCOUNTER — Ambulatory Visit (INDEPENDENT_AMBULATORY_CARE_PROVIDER_SITE_OTHER): Payer: Medicare Other | Admitting: Clinical Cardiac Electrophysiology

## 2020-04-22 ENCOUNTER — Encounter (INDEPENDENT_AMBULATORY_CARE_PROVIDER_SITE_OTHER): Payer: Self-pay | Admitting: Clinical Cardiac Electrophysiology

## 2020-04-22 VITALS — BP 104/76 | HR 60 | Ht 73.0 in | Wt 180.0 lb

## 2020-04-22 DIAGNOSIS — I10 Essential (primary) hypertension: Secondary | ICD-10-CM

## 2020-04-22 DIAGNOSIS — Z7901 Long term (current) use of anticoagulants: Secondary | ICD-10-CM

## 2020-04-22 DIAGNOSIS — Z950 Presence of cardiac pacemaker: Secondary | ICD-10-CM

## 2020-04-22 DIAGNOSIS — I482 Chronic atrial fibrillation, unspecified: Secondary | ICD-10-CM

## 2020-04-22 DIAGNOSIS — I4811 Longstanding persistent atrial fibrillation: Secondary | ICD-10-CM

## 2020-04-22 LAB — ECG 12-LEAD
Atrial Rate: 250 {beats}/min
Atrial Rate: 49 {beats}/min
Q-T Interval: 478 ms
Q-T Interval: 486 ms
QRS Duration: 192 ms
QRS Duration: 194 ms
QTC Calculation (Bezet): 478 ms
QTC Calculation (Bezet): 486 ms
R Axis: -22 degrees
R Axis: -23 degrees
T Axis: 95 degrees
T Axis: 96 degrees
Ventricular Rate: 60 {beats}/min
Ventricular Rate: 60 {beats}/min

## 2020-04-22 NOTE — Progress Notes (Signed)
Vital connect 48 hr holter applied and registered. ID [id number]
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
## 2020-04-22 NOTE — Psych (Signed)
NAMERAFIK KOPPEL  MRN: [id number]  Account: [id number]  Session Start: 04/21/2020 11:00:00 AM  Session Stop: 04/21/2020 11:53:00 AM    Total Treatment Minutes: 53.00 Minutes    Psychology Services  Outpatient Rehabilitation Progress Note    Rehab Diagnosis: TBI  Demographics:            Age: 28Y            Gender: Male    Medications and Allergies: Significant rehabilitation considerations:   NKA  Rehabilitation Precautions/Restrictions:   No rehabilitation precautions.    SUBJECTIVE  Patient Reports: Pt reported the following:  Feeling depressed  Feeling discouraged  Feeling worried about his future    Suicide Risk Screen: Patient has these primary or secondary behavioral health  diagnoses or complaints: Describes self as mildly depressed. 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  Mood was depressed.  No evidence of  psychosis or delusions.  He explicitly denied SI and HI.  BDI score was by  history 46/64.  BDI on 11/26/2019 was 29.    Interventions:   NPSY INDIVID HLTH INTERV INT   NPSY INDIVID HLTH INTERV ADD    ASSESSMENT    Impressions:  Pt reported the following:  Feeling depressed  Feeling discouraged  Feeling worried about his future      PLAN  Continued Psychology services are recommended to address: Continue to follow  patient as needed for emotional support, assistance with coping skills.  Recommendations:  Weekly behavioral intervention    Signed by: Volanda Napoleon, Psy.D. 04/21/2020 11:53:00 AM

## 2020-04-29 NOTE — Psych (Signed)
[name]  MRN: [id number]  Account: [id number]  Session Start: 04/28/2020 11:00:00 AM  Session Stop: 04/28/2020 11:53:00 AM    Total Treatment Minutes: 53.00 Minutes    Psychology Services  Outpatient Rehabilitation Progress Note    Rehab Diagnosis: TBI  Demographics:            Age: 66Y            Gender: Male    Medications and Allergies: Significant rehabilitation considerations:   NKA  Rehabilitation Precautions/Restrictions:   No rehabilitation precautions.    SUBJECTIVE  Patient Reports: Pt reported the following:  Feeling persistent depression  Feeling persistent anxiety  Negative forcasting  Feeling irritable    Suicide Risk Screen: Patient has these primary or secondary behavioral health  diagnoses or complaints: Describes self as mildly depressed. 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  Mood was depressed.  No evidence of  psychosis or delusions.  He explicitly denied SI and HI.  BDI score was by  history 46/64.  BDI on 11/26/2019 was 29.    Interventions:   NPSY INDIVID HLTH INTERV INT   NPSY INDIVID HLTH INTERV ADD    ASSESSMENT    Impressions:  Pt reported the following:  Feeling persistent depression  Feeling persistent anxiety  Negative forcasting  Feeling irritable      PLAN  Continued Psychology services are recommended to address: Continue to follow  patient as needed for emotional support, assistance with coping skills.  Recommendations:  Weekly behavioral intervention    Signed by: Volanda Napoleon, Psy.D. 04/28/2020 11:53:00 AM

## 2020-05-01 ENCOUNTER — Telehealth (INDEPENDENT_AMBULATORY_CARE_PROVIDER_SITE_OTHER): Payer: Self-pay

## 2020-05-01 ENCOUNTER — Other Ambulatory Visit (INDEPENDENT_AMBULATORY_CARE_PROVIDER_SITE_OTHER): Payer: Self-pay

## 2020-05-01 ENCOUNTER — Ambulatory Visit: Payer: Medicare Other | Attending: Specialist

## 2020-05-01 DIAGNOSIS — R41844 Frontal lobe and executive function deficit: Secondary | ICD-10-CM | POA: Insufficient documentation

## 2020-05-01 DIAGNOSIS — R411 Anterograde amnesia: Secondary | ICD-10-CM | POA: Insufficient documentation

## 2020-05-01 DIAGNOSIS — Z950 Presence of cardiac pacemaker: Secondary | ICD-10-CM

## 2020-05-01 DIAGNOSIS — I482 Chronic atrial fibrillation, unspecified: Secondary | ICD-10-CM

## 2020-05-01 DIAGNOSIS — R278 Other lack of coordination: Secondary | ICD-10-CM | POA: Insufficient documentation

## 2020-05-01 DIAGNOSIS — Z9181 History of falling: Secondary | ICD-10-CM | POA: Insufficient documentation

## 2020-05-01 DIAGNOSIS — R41840 Attention and concentration deficit: Secondary | ICD-10-CM | POA: Insufficient documentation

## 2020-05-01 NOTE — Psych (Signed)
[name]  MRN: [id number]  Account: [id number]  Session Start: 04/30/2020 11:00:00 AM  Session Stop: 04/30/2020 11:53:00 AM    Total Treatment Minutes: 53.00 Minutes    Psychology Services  Outpatient Rehabilitation Progress Note    Rehab Diagnosis: TBI  Demographics:            Age: 86Y            Gender: Male    Medications and Allergies: Significant rehabilitation considerations:   NKA  Rehabilitation Precautions/Restrictions:   No rehabilitation precautions.    SUBJECTIVE  Patient Reports: Pt reported the following:  Feeling persistent depression  Feeling persistent anxiety  Feeling supported by spouse    Suicide Risk Screen: Patient has these primary or secondary behavioral health  diagnoses or complaints: Describes self as mildly depressed. 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  Mood was depressed.  No evidence of  psychosis or delusions.  He explicitly denied SI and HI.  BDI score was by  history 46/64.  BDI on 11/26/2019 was 29.    Interventions:   NPSY INDIVID HLTH INTERV INT   NPSY INDIVID HLTH INTERV ADD    ASSESSMENT    Impressions:  Pt reported the following:  Feeling persistent depression  Feeling persistent anxiety  Feeling supported by spouse      PLAN  Continued Psychology services are recommended to address: Continue to follow  patient as needed for emotional support, assistance with coping skills.  Recommendations:  Weekly behavioral intervention    Signed by: Volanda Napoleon, Psy.D. 04/30/2020 11:53:00 AM

## 2020-05-01 NOTE — Telephone Encounter (Signed)
holter billed and scanned

## 2020-05-05 NOTE — Psych (Signed)
[name] [name]  MRN: [id number]  Account: [id number]  Session Start: 05/05/2020 11:00:00 AM  Session Stop: 05/05/2020 11:53:00 AM    Total Treatment Minutes: 53.00 Minutes    Psychology Services  Outpatient Rehabilitation Progress Note    Rehab Diagnosis: TBI  Demographics:            Age: 31Y            Gender: Male    Medications and Allergies: Significant rehabilitation considerations:   NKA  Rehabilitation Precautions/Restrictions:   No rehabilitation precautions.    SUBJECTIVE  Patient Reports: Pt reported the following:  Feeling self-critical  Feeling persistent worry  Negative forcasting    Suicide Risk Screen: Patient has these primary or secondary behavioral health  diagnoses or complaints: Describes self as mildly depressed. 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  Mood was depressed.  No evidence of  psychosis or delusions.  He explicitly denied SI and HI.  BDI score was by  history 46/64.  BDI on 11/26/2019 was 29.    Interventions:   NPSY INDIVID HLTH INTERV INT   NPSY INDIVID HLTH INTERV ADD    ASSESSMENT    Impressions:  Pt reported the following:  Feeling self-critical  Feeling persistent worry  Negative forcasting      PLAN  Continued Psychology services are recommended to address: Continue to follow  patient as needed for emotional support, assistance with coping skills.  Recommendations:  Weekly behavioral intervention    Signed by: Volanda Napoleon, Psy.D. 05/05/2020 11:53:00 AM

## 2020-05-07 ENCOUNTER — Inpatient Hospital Stay
Admission: RE | Admit: 2020-05-07 | Discharge: 2020-05-07 | Disposition: A | Discharge status: Home / Self Care | Payer: Medicare Other | Source: Ambulatory Visit | Attending: Specialist | Admitting: Specialist

## 2020-05-07 DIAGNOSIS — I482 Chronic atrial fibrillation, unspecified: Secondary | ICD-10-CM | POA: Insufficient documentation

## 2020-05-07 LAB — PT/INR
PT INR: 1.1 (ref 0.9–1.1)
PT: 12.2 s (ref 10.1–12.9)

## 2020-05-07 NOTE — Psych (Signed)
NAMELINCOLN [name]  MRN: [id number]  Account: [id number]  Session Start: 05/07/2020 11:00:00 AM  Session Stop: 05/07/2020 11:53:00 AM    Total Treatment Minutes: 53.00 Minutes    Psychology Services  Outpatient Rehabilitation Progress Note    Rehab Diagnosis: TBI  Demographics:            Age: 70Y            Gender: Male    Medications and Allergies: Significant rehabilitation considerations:   NKA  Rehabilitation Precautions/Restrictions:   No rehabilitation precautions.    SUBJECTIVE  Patient Reports: Pt reported the following:  Feeling anxious  Feeling guilty  Feeling self-critical  PCP adjusted mood medications to improve overall mental health    Suicide Risk Screen: Patient has these primary or secondary behavioral health  diagnoses or complaints: Describes self as mildly depressed. 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  Mood was depressed.  No evidence of  psychosis or delusions.  He explicitly denied SI and HI.  BDI score was by  history 46/64.  BDI on 11/26/2019 was 29.  Interventions:   NPSY INDIVID HLTH INTERV INT   NPSY INDIVID HLTH INTERV ADD    ASSESSMENT    Impressions:  Pt reported the following:  Feeling anxious  Feeling guilty  Feeling self-critical  PCP adjusted mood medications to improve overall mental health      PLAN  Continued Psychology services are recommended to address: Continue to follow  patient as needed for emotional support, assistance with coping skills.  Recommendations:  Weekly behavioral intervention    Signed by: Volanda Napoleon, Psy.D. 05/07/2020 11:53:00 AM

## 2020-05-14 NOTE — Psych (Signed)
NAMETRIGGER FRASIER  MRN: [id number]  Account: [id number]  Session Start: 05/14/2020 11:00:00 AM  Session Stop: 05/14/2020 11:53:00 AM    Total Treatment Minutes: 53.00 Minutes    Psychology Services  Outpatient Rehabilitation Progress Note    Rehab Diagnosis: TBI  Demographics:            Age: 29Y            Gender: Male    Medications and Allergies: Significant rehabilitation considerations:   NKA  Rehabilitation Precautions/Restrictions:   No rehabilitation precautions.    SUBJECTIVE  Patient Reports: Pt reported the following:  Feeling depressed  Feeling irritable  Feeling persistent anxiety    Suicide Risk Screen: Patient has these primary or secondary behavioral health  diagnoses or complaints: Describes self as mildly depressed. 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  Mood was depressed.  No evidence of  psychosis or delusions.  He explicitly denied SI and HI.  BDI score was by  history 46/64.  BDI on 11/26/2019 was 29.    Interventions:   NPSY INDIVID HLTH INTERV INT   NPSY INDIVID HLTH INTERV ADD    ASSESSMENT    Impressions:  Pt reported the following:  Feeling depressed  Feeling irritable  Feeling persistent anxiety      PLAN  Continued Psychology services are recommended to address: Continue to follow  patient as needed for emotional support, assistance with coping skills.  Recommendations:  Weekly behavioral intervention    Signed by: Volanda Napoleon, Psy.D. 05/14/2020 11:53:00 AM

## 2020-05-14 NOTE — Psych (Signed)
[name]  MRN: [id number]  Account: [id number]  Session Start: 05/12/2020 11:00:00 AM  Session Stop: 05/12/2020 11:53:00 AM    Total Treatment Minutes: 53.00 Minutes    Psychology Services  Outpatient Rehabilitation Progress Note    Rehab Diagnosis: TBI  Demographics:            Age: 61Y            Gender: Male    Medications and Allergies: Significant rehabilitation considerations:   NKA  Rehabilitation Precautions/Restrictions:   No rehabilitation precautions.    SUBJECTIVE  Patient Reports: Pt reported the following:  Feeling anxious  Gait worsening  Neurology consult end of month  Feeling despair    Suicide Risk Screen: Patient has these primary or secondary behavioral health  diagnoses or complaints: Describes self as mildly depressed. 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  Mood was depressed.  No evidence of  psychosis or delusions.  He explicitly denied SI and HI.  BDI score was by  history 46/64.  BDI on 11/26/2019 was 29.    Interventions:   NPSY INDIVID HLTH INTERV INT   NPSY INDIVID HLTH INTERV ADD    ASSESSMENT    Impressions:  Pt reported the following:  Feeling anxious  Gait worsening  Neurology consult end of month  Feeling despair      PLAN  Continued Psychology services are recommended to address: Continue to follow  patient as needed for emotional support, assistance with coping skills.  Recommendations:  Weekly behavioral intervention    Signed by: Volanda Napoleon, Psy.D. 05/12/2020 11:53:00 AM

## 2020-05-20 NOTE — Psych (Signed)
NAMEMORTY [name]  MRN: [id number]  Account: [id number]  Session Start: 05/19/2020 11:00:00 AM  Session Stop: 05/19/2020 11:53:00 AM    Total Treatment Minutes: 53.00 Minutes    Psychology Services  Outpatient Rehabilitation Progress Note    Rehab Diagnosis: TBI  Demographics:            Age: 68Y            Gender: Male    Medications and Allergies: Significant rehabilitation considerations:   NKA  Rehabilitation Precautions/Restrictions:   No rehabilitation precautions.    SUBJECTIVE  Patient Reports: Pt reported the following:  Feeling depressed  Feeling tearful  Feeling self-critical  Feeling guilt  Holding a negative view of self    Suicide Risk Screen: Patient has these primary or secondary behavioral health  diagnoses or complaints: Describes self as mildly depressed. Depression related  to medical situation Denied SI and hI    OBJECTIVE  General Observation: Arrived on time.  Mood was depressed.  No evidence of  psychosis or delusions.  He explicitly denied SI and HI.  BDI score was by  history 46/64.  BDI on 11/26/2019 was 29.    Interventions:   NPSY INDIVID HLTH INTERV INT   NPSY INDIVID HLTH INTERV ADD    ASSESSMENT    Impressions:  Pt reported the following:  Feeling depressed  Feeling tearful  Feeling self-critical  Feeling guilt  Holding a negative view of self    PLAN  Continued Psychology services are recommended to address: Continue to follow  patient as needed for emotional support, assistance with coping skills.  Recommendations:  Weekly behavioral intervention    Signed by: Volanda Napoleon, Psy.D. 05/19/2020 11:53:00 AM

## 2020-05-21 NOTE — Psych (Signed)
NAMEMARGARET STAGGS  MRN: [id number]  Account: [id number]  Session Start: 05/21/2020 11:00:00 AM  Session Stop: 05/21/2020 11:53:00 AM    Total Treatment Minutes: 53.00 Minutes    Psychology Services  Outpatient Rehabilitation Progress Note    Rehab Diagnosis: TBI  Demographics:            Age: 44Y            Gender: Male    Medications and Allergies: Significant rehabilitation considerations:   NKA  Rehabilitation Precautions/Restrictions:   No rehabilitation precautions.    SUBJECTIVE  Patient Reports: Pt reported the following:  Feeling depressed  Feeling tearful  Feeling less anxious  Neurology appointment tomorrow  Feeling supported by spouse and sister    Suicide Risk Screen: Patient has these primary or secondary behavioral health  diagnoses or complaints: Describes self as mildly depressed. 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  Mood was depressed.  No evidence of  psychosis or delusions.  He explicitly denied SI and HI.  BDI score was by  history 46/64.  BDI on 11/26/2019 was 29.    Interventions:   NPSY INDIVID HLTH INTERV INT   NPSY INDIVID HLTH INTERV ADD    ASSESSMENT    Impressions:  Pt reported the following:  Feeling depressed  Feeling tearful  Feeling less anxious  Neurology appointment tomorrow  Feeling supported by spouse and sister      PLAN  Continued Psychology services are recommended to address: Continue to follow  patient as needed for emotional support, assistance with coping skills.  Recommendations:  Weekly behavioral intervention    Signed by: Volanda Napoleon, Psy.D. 05/21/2020 11:53:00 AM

## 2020-05-27 NOTE — Psych (Signed)
[name]  MRN: [id number]  Account: [id number]  Session Start: 05/26/2020 11:00:00 AM  Session Stop: 05/26/2020 11:53:00 AM    Total Treatment Minutes: 53.00 Minutes    Psychology Services  Outpatient Rehabilitation Progress Note    Rehab Diagnosis: TBI  Demographics:            Age: 33Y            Gender: Male    Medications and Allergies: Significant rehabilitation considerations:   NKA  Rehabilitation Precautions/Restrictions:   No rehabilitation precautions.    SUBJECTIVE  Patient Reports: Pt reported the following:  Feeling depressed  Feelng worried about his future  Interpreting feelings as facts    Suicide Risk Screen: Patient has these primary or secondary behavioral health  diagnoses or complaints: Describes self as mildly depressed. Depression related  to medical situation Denied IS and HI    OBJECTIVE  General Observation: Arrived on time.  Mood was depressed.  No evidence of  psychosis or delusions.  He explicitly denied SI and HI.  BDI score was by  history 46/64.  BDI on 11/26/2019 was 29.    Interventions:   NPSY INDIVID HLTH INTERV INT   NPSY INDIVID HLTH INTERV ADD    ASSESSMENT    Impressions:  Pt reported the following:  Feeling depressed  Feelng worried about his future  Interpreting feelings as facts    PLAN  Continued Psychology services are recommended to address: Continue to follow  patient as needed for emotional support, assistance with coping skills.  Recommendations:  Weekly behavioral intervention    Signed by: Volanda Napoleon, Psy.D. 05/26/2020 11:53:00 AM

## 2020-05-28 ENCOUNTER — Encounter (INDEPENDENT_AMBULATORY_CARE_PROVIDER_SITE_OTHER): Payer: Self-pay | Admitting: Clinical Cardiac Electrophysiology

## 2020-05-28 NOTE — Psych (Signed)
NAMEDAMARCO KEYSOR  MRN: [id number]  Account: [id number]  Session Start: 05/28/2020 11:00:00 AM  Session Stop: 05/28/2020 11:53:00 AM    Total Treatment Minutes: 53.00 Minutes    Psychology Services  Outpatient Rehabilitation Progress Note    Rehab Diagnosis: TBI  Demographics:            Age: 62Y            Gender: Male    Medications and Allergies: Significant rehabilitation considerations:   NKA  Rehabilitation Precautions/Restrictions:   No rehabilitation precautions.    SUBJECTIVE  Patient Reports: Pt reported the following:  Feeling depressed  Feeling self-critical  Feeling tearful    Suicide Risk Screen: Patient has these primary or secondary behavioral health  diagnoses or complaints: Describes self as mildly depressed. 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  Mood was depressed.  No evidence of  psychosis or delusions.  He explicitly denied SI and HI.  BDI score was by  history 46/64.  BDI on 11/26/2019 was 29.    Interventions:   NPSY INDIVID HLTH INTERV INT   NPSY INDIVID HLTH INTERV ADD    ASSESSMENT    Impressions:  Pt reported the following:  Feeling depressed  Feeling self-critical  Feeling tearful    PLAN  Continued Psychology services are recommended to address: Continue to follow  patient as needed for emotional support, assistance with coping skills.  Recommendations:  Weekly behavioral intervention    Signed by: Volanda Napoleon, Psy.D. 05/28/2020 11:53:00 AM

## 2020-06-01 ENCOUNTER — Ambulatory Visit: Payer: Medicare Other | Attending: Specialist

## 2020-06-01 DIAGNOSIS — R278 Other lack of coordination: Secondary | ICD-10-CM | POA: Insufficient documentation

## 2020-06-01 DIAGNOSIS — R41840 Attention and concentration deficit: Secondary | ICD-10-CM | POA: Insufficient documentation

## 2020-06-01 DIAGNOSIS — R411 Anterograde amnesia: Secondary | ICD-10-CM | POA: Insufficient documentation

## 2020-06-01 DIAGNOSIS — Z9181 History of falling: Secondary | ICD-10-CM | POA: Insufficient documentation

## 2020-06-01 DIAGNOSIS — R41844 Frontal lobe and executive function deficit: Secondary | ICD-10-CM | POA: Insufficient documentation

## 2020-06-02 ENCOUNTER — Inpatient Hospital Stay
Admission: RE | Admit: 2020-06-02 | Discharge: 2020-06-02 | Disposition: A | Discharge status: Home / Self Care | Payer: Medicare Other | Source: Ambulatory Visit | Attending: Specialist | Admitting: Specialist

## 2020-06-02 DIAGNOSIS — I482 Chronic atrial fibrillation, unspecified: Secondary | ICD-10-CM | POA: Insufficient documentation

## 2020-06-02 LAB — PT AND APTT
PT INR: 2.5 — ABNORMAL HIGH (ref 0.9–1.1)
PT: 28.2 s — ABNORMAL HIGH (ref 10.1–12.9)
PTT: 41 s — ABNORMAL HIGH (ref 27–39)

## 2020-06-03 NOTE — Psych (Signed)
Jonathan [name]  MRN: [id number]  Account: [id number]  Session Start: 06/02/2020 11:00:00 AM  Session Stop: 06/02/2020 11:53:00 AM    Total Treatment Minutes: 53.00 Minutes    Psychology Services  Outpatient Rehabilitation Progress Note    Rehab Diagnosis: TBI  Demographics:            Age: 64Y            Gender: Male    Medications and Allergies: Significant rehabilitation considerations:   NKA  Rehabilitation Precautions/Restrictions:   No rehabilitation precautions.    SUBJECTIVE  Patient Reports: Pt reported the following:  Feeling depressed  Feeling tearful  Feeling guilty  Feeling self-critical    Suicide Risk Screen: Patient has these primary or secondary behavioral health  diagnoses or complaints: Describes self as mildly depressed. 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  Mood was depressed.  No evidence of  psychosis or delusions.  He explicitly denied SI and HI.  BDI score was by  history 46/64.  BDI on 11/26/2019 was 29.    Interventions:   NPSY INDIVID HLTH INTERV INT   NPSY INDIVID HLTH INTERV ADD    ASSESSMENT    Impressions:  Pt reported the following:  Feeling depressed  Feeling tearful  Feeling guilty  Feeling self-critical    PLAN  Continued Psychology services are recommended to address: Continue to follow  patient as needed for emotional support, assistance with coping skills.  Recommendations:  Weekly behavioral intervention    Signed by: Volanda Napoleon, Psy.D. 06/02/2020 11:53:00 AM

## 2020-06-04 NOTE — Psych (Signed)
NAMEKIMONI PAGLIARULO  MRN: [id number]  Account: [id number]  Session Start: 06/04/2020 11:00:00 AM  Session Stop: 06/04/2020 11:53:00 AM    Total Treatment Minutes: 53.00 Minutes    Psychology Services  Outpatient Rehabilitation Progress Note    Rehab Diagnosis: TBI  Demographics:            Age: 65Y            Gender: Male    Medications and Allergies: Significant rehabilitation considerations:   NKA  Rehabilitation Precautions/Restrictions:   No rehabilitation precautions.    SUBJECTIVE  Pt reported the following:  Exercising more  Walking better  Continued self-criticism  Continued tearfulness  Feeling depressed    Suicide Risk Screen: Patient has these primary or secondary behavioral health  diagnoses or complaints: Describes self as mildly depressed. 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  Mood was depressed.  No evidence of  psychosis or delusions.  He explicitly denied SI and HI.  BDI score was by  history 46/64.  BDI on 11/26/2019 was 29.    Interventions:   NPSY INDIVID HLTH INTERV INT   NPSY INDIVID HLTH INTERV ADD    ASSESSMENT    Impressions:  Pt reported the following:  Exercising more  Walking better  Continued self-criticism  Continued tearfulness  Feeling depressed      PLAN  Continued Psychology services are recommended to address: Continue to follow  patient as needed for emotional support, assistance with coping skills.  Recommendations:  Weekly behavioral intervention    Signed by: Volanda Napoleon, Psy.D. 06/04/2020 11:53:00 AM

## 2020-06-10 NOTE — Psych (Signed)
[name]  MRN: [id number]  Account: [id number]  Session Start: 06/09/2020 11:00:00 AM  Session Stop: 06/09/2020 11:53:00 AM    Total Treatment Minutes: 53.00 Minutes    Psychology Services  Outpatient Rehabilitation Progress Note    Rehab Diagnosis: TBI  Demographics:            Age: 81Y            Gender: Male    Medications and Allergies: Significant rehabilitation considerations:   NKA  Rehabilitation Precautions/Restrictions:   No rehabilitation precautions.    SUBJECTIVE  Patient Reports: Pt reported the following:  Feeling slightly less depressed  Continued self-criticism and guilt  Feeling overwhelmed  Fearful of the future    Suicide Risk Screen: Patient has these primary or secondary behavioral health  diagnoses or complaints: Describes self as mildly depressed. 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  Mood was depressed.  No evidence of  psychosis or delusions.  He explicitly denied SI and HI.  BDI score was by  history 46/64.  BDI on 11/26/2019 was 29.    Interventions:   NPSY INDIVID HLTH INTERV INT   NPSY INDIVID HLTH INTERV ADD    ASSESSMENT    Impressions:  Pt reported the following:  Feeling slightly less depressed  Continued self-criticism and guilt  Feeling overwhelmed  Fearful of the future    PLAN  Continued Psychology services are recommended to address: Continue to follow  patient as needed for emotional support, assistance with coping skills.  Recommendations:  Weekly behavioral intervention    Signed by: Volanda Napoleon, Psy.D. 06/09/2020 11:53:00 AM

## 2020-06-12 ENCOUNTER — Encounter (INDEPENDENT_AMBULATORY_CARE_PROVIDER_SITE_OTHER): Payer: Self-pay | Admitting: Clinical Cardiac Electrophysiology

## 2020-06-17 NOTE — Psych (Signed)
NAMEKEYMARI SATO  MRN: [id number]  Account: [id number]  Session Start: 06/16/2020 11:00:00 AM  Session Stop: 06/16/2020 11:53:00 AM    Total Treatment Minutes: 53.00 Minutes    Psychology Services  Outpatient Rehabilitation Progress Note    Rehab Diagnosis: TBI  Demographics:            Age: 75Y            Gender: Male    Medications and Allergies: Significant rehabilitation considerations:   NKA  Rehabilitation Precautions/Restrictions:   No rehabilitation precautions.    SUBJECTIVE  Patient Reports: Pt reported the following:  Feeling slightly less depressed  Feeling physicall stronger  Continues to be self-critical  Continues to feel guilt  Continued to forecast negative future events    Suicide Risk Screen: Patient has these primary or secondary behavioral health  diagnoses or complaints: Describes self as mildly depressed. Depression related  to medical situation Denied SI and hI    OBJECTIVE  General Observation: Arrived on time.  Mood was depressed.  No evidence of  psychosis or delusions.  He explicitly denied SI and HI.  BDI score was by  history 46/64.  BDI on 11/26/2019 was 29.    Interventions:   NPSY INDIVID HLTH INTERV INT   NPSY INDIVID HLTH INTERV ADD    ASSESSMENT    Impressions:  Pt reported the following:  Feeling slightly less depressed  Feeling physicall stronger  Continues to be self-critical  Continues to feel guilt  Continued to forecast negative future events      PLAN  Continued Psychology services are recommended to address: Continue to follow  patient as needed for emotional support, assistance with coping skills.  Recommendations:  Weekly behavioral intervention    Signed by: Volanda Napoleon, Psy.D. 06/16/2020 11:53:00 AM

## 2020-06-18 NOTE — Psych (Signed)
Jonathan [name]  MRN: [id number]  Account: [id number]  Session Start: 06/18/2020 11:00:00 AM  Session Stop: 06/18/2020 11:53:00 AM    Total Treatment Minutes: 53.00 Minutes    Psychology Services  Outpatient Rehabilitation Progress Note    Rehab Diagnosis: TBI  Demographics:            Age: 38Y            Gender: Male    Medications and Allergies: Significant rehabilitation considerations:   NKA  Rehabilitation Precautions/Restrictions:   No rehabilitation precautions.    SUBJECTIVE  Patient Reports: Pt reported the following:  Feeling depressed  Feeling persistent guilty  Feels that thoughts and feelings harm others    Suicide Risk Screen: Patient has these primary or secondary behavioral health  diagnoses or complaints: Describes self as mildly depressed. Depression related  to medical siutation Denied Si and hI    OBJECTIVE  General Observation: Arrived on time.  Mood was depressed.  No evidence of  psychosis or delusions.  He explicitly denied SI and HI.  BDI score was by  history 46/64.  BDI on 11/26/2019 was 29.    Interventions:   NPSY INDIVID HLTH INTERV INT   NPSY INDIVID HLTH INTERV ADD    ASSESSMENT    Impressions:  Pt reported the following:  Feeling depressed  Feeling persistent guilty  Feels that thoughts and feelings harm others      PLAN  Continued Psychology services are recommended to address: Continue to follow  patient as needed for emotional support, assistance with coping skills.  Recommendations:  Weekly behavioral intervention    Signed by: Volanda Napoleon, Psy.D. 06/18/2020 11:53:00 AM

## 2020-07-01 ENCOUNTER — Ambulatory Visit: Payer: Medicare Other | Attending: Specialist

## 2020-07-01 DIAGNOSIS — Z9181 History of falling: Secondary | ICD-10-CM | POA: Insufficient documentation

## 2020-07-01 DIAGNOSIS — R278 Other lack of coordination: Secondary | ICD-10-CM | POA: Insufficient documentation

## 2020-07-01 DIAGNOSIS — R41844 Frontal lobe and executive function deficit: Secondary | ICD-10-CM | POA: Insufficient documentation

## 2020-07-01 DIAGNOSIS — R411 Anterograde amnesia: Secondary | ICD-10-CM | POA: Insufficient documentation

## 2020-07-01 DIAGNOSIS — R41840 Attention and concentration deficit: Secondary | ICD-10-CM | POA: Insufficient documentation

## 2020-07-02 NOTE — Psych (Signed)
NAMEEDAN [name]  MRN: [id number]  Account: [id number]  Session Start: 07/02/2020 11:00:00 AM  Session Stop: 07/02/2020 11:53:00 AM    Total Treatment Minutes: 53.00 Minutes    Psychology Services  Outpatient Rehabilitation Progress Note    Rehab Diagnosis: TBI  Demographics:            Age: 58Y            Gender: Male    Medications and Allergies: Significant rehabilitation considerations:   NKA  Rehabilitation Precautions/Restrictions:   No rehabilitation precautions.    SUBJECTIVE  Patient Reports: Pt reported the following:  Feeling self-critical  Feeling angry  Feeling pre-occupied with daydreams of disasters    Suicide Risk Screen: Patient has these primary or secondary behavioral health  diagnoses or complaints: Describes self as mildly depressed. 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  Mood was depressed.  No evidence of  psychosis or delusions.  He explicitly denied SI and HI.  BDI score was by  history 46/64.  BDI on 11/26/2019 was 29.    Interventions:   NPSY INDIVID HLTH INTERV INT   NPSY INDIVID HLTH INTERV ADD    ASSESSMENT    Impressions:  Pt reported the following:  Feeling self-critical  Feeling angry  Feeling pre-occupied with daydreams of disasters      PLAN  Continued Psychology services are recommended to address: Continue to follow  patient as needed for emotional support, assistance with coping skills.  Recommendations:  Weekly behavioral intervention    Signed by: Volanda Napoleon, Psy.D. 07/02/2020 11:53:00 AM

## 2020-07-03 ENCOUNTER — Ambulatory Visit
Admission: RE | Admit: 2020-07-03 | Discharge: 2020-07-03 | Disposition: A | Discharge status: Home / Self Care | Payer: Medicare Other | Source: Ambulatory Visit | Attending: Specialist | Admitting: Specialist

## 2020-07-03 DIAGNOSIS — I4891 Unspecified atrial fibrillation: Secondary | ICD-10-CM | POA: Insufficient documentation

## 2020-07-03 LAB — PT/INR
PT INR: 1.2 — ABNORMAL HIGH (ref 0.9–1.1)
PT: 14.2 s — ABNORMAL HIGH (ref 10.1–12.9)
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
## 2020-07-08 NOTE — Psych (Signed)
NAMEADAM [name]  MRN: [id number]  Account: [id number]  Session Start: 07/07/2020 11:00:00 AM  Session Stop: 07/07/2020 11:53:00 AM    Total Treatment Minutes: 53.00 Minutes    Psychology Services  Outpatient Rehabilitation Progress Note    Rehab Diagnosis: TBI  Demographics:            Age: 41Y            Gender: Male    Medications and Allergies: Significant rehabilitation considerations:   NKA  Rehabilitation Precautions/Restrictions:   No rehabilitation precautions.    SUBJECTIVE  Patient Reports: Pt reported the following:  Feeling depressed  Feeling tearful  Rejecting help from other    Suicide Risk Screen: Patient has these primary or secondary behavioral health  diagnoses or complaints: Describes self as mildly depressed. 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  Mood was depressed.  No evidence of  psychosis or delusions.  He explicitly denied SI and HI.  BDI score was by  history 46/64.  BDI on 11/26/2019 was 29.    Interventions:   NPSY INDIVID HLTH INTERV INT   NPSY INDIVID HLTH INTERV ADD    ASSESSMENT    Impressions:  Pt reported the following:  Feeling depressed  Feeling tearful  Rejecting help from other      PLAN  Continued Psychology services are recommended to address: Continue to follow  patient as needed for emotional support, assistance with coping skills.  Recommendations:  Weekly behavioral intervention    Signed by: Jonathan [name], Psy.D. 07/07/2020 11:53:00 AM

## 2020-07-09 NOTE — Psych (Signed)
[name]  MRN: [id number]  Account: [id number]  Session Start: 07/09/2020 11:00:00 AM  Session Stop: 07/09/2020 11:53:00 AM    Total Treatment Minutes: 53.00 Minutes    Psychology Services  Outpatient Rehabilitation Progress Note    Rehab Diagnosis: TBI  Demographics:            Age: 37Y            Gender: Male    Medications and Allergies: Significant rehabilitation considerations:   NKA  Rehabilitation Precautions/Restrictions:   No rehabilitation precautions.    SUBJECTIVE  Patient Reports: Pt reported the following:  Feeling depressed  Feeling irritable  Feeling angry    Suicide Risk Screen: Patient has these primary or secondary behavioral health  diagnoses or complaints: Describes self as mildly depressed. Depression related  to medical siutation Denied SI and HI    OBJECTIVE  General Observation: Arrived on time.  Mood was depressed.  No evidence of  psychosis or delusions.  He explicitly denied SI and HI.  BDI score was by  history 46/64.  BDI on 11/26/2019 was 29.    Interventions:   NPSY INDIVID HLTH INTERV INT   NPSY INDIVID HLTH INTERV ADD    ASSESSMENT    Impressions:  Pt reported the following:  Feeling depressed  Feeling irritable  Feeling angry    PLAN  Continued Psychology services are recommended to address: Continue to follow  patient as needed for emotional support, assistance with coping skills.  Recommendations:  Weekly  behavioral intervention    Signed by: Volanda Napoleon, Psy.D. 07/09/2020 11:53:00 AM

## 2020-07-15 NOTE — Psych (Signed)
[name]  MRN: [id number]  Account: [id number]  Session Start: 07/14/2020 11:00:00 AM  Session Stop: 07/14/2020 11:53:00 AM    Total Treatment Minutes: 53.00 Minutes    Psychology Services  Outpatient Rehabilitation Progress Note    Rehab Diagnosis: TBI  Demographics:            Age: 11Y            Gender: Male    Medications and Allergies: Significant rehabilitation considerations:   NKA  Rehabilitation Precautions/Restrictions:   No rehabilitation precautions.    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 Depression related  to medical situation Denied SI and hI    OBJECTIVE  General Observation: Arrived on time.  Mood was depressed.  No evidence of  psychosis or delusions.  He explicitly denied SI and HI.  BDI score was by  history 46/64.  BDI on 11/26/2019 was 29.    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  Recommendations:  Weekly behavioral intervention    Signed by: Volanda Napoleon, Psy.D. 07/14/2020 11:53:00 AM

## 2020-07-16 NOTE — Psych (Signed)
NAMEDEMONTA [name]  MRN: [id number]  Account: [id number]  Session Start: 07/16/2020 11:00:00 AM  Session Stop: 07/16/2020 11:53:00 AM    Total Treatment Minutes: 53.00 Minutes    Psychology Services  Outpatient Rehabilitation Progress Note    Rehab Diagnosis: TBI  Demographics:            Age: 43Y            Gender: Male    Medications and Allergies: Significant rehabilitation considerations:   NKA  Rehabilitation Precautions/Restrictions:   No rehabilitation precautions.    SUBJECTIVE  Patient Reports: Pt reported the following:  Feeling less depressed  Feeling more hopefull  Feeling less self-critical  Continued guilt and anxiety    Suicide Risk Screen: Patient has these primary or secondary behavioral health  diagnoses or complaints: Describes self as mildly depressed. 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  Mood was depressed.  No evidence of  psychosis or delusions.  He explicitly denied SI and HI.  BDI score was by  history 46/64.  BDI on 11/26/2019 was 29.    Interventions:   NPSY INDIVID HLTH INTERV INT 30MIN   NPSY INDIVID HLTH INTERV ADD 15MIN    ASSESSMENT    Impressions:  Pt reported the following:  Feeling less depressed  Feeling more hopefull  Feeling less self-critical  Continued guilt and anxiety      PLAN  Continued Psychology services are recommended to address: Continue to follow  patient as needed for emotional support, assistance with coping skills.  Recommendations:  Weekly behavioral intervention    Signed by: Kyra Searles, Psy.D. 07/16/2020 11:53:00 AM

## 2020-08-03 ENCOUNTER — Ambulatory Visit: Payer: Medicare Other | Attending: Specialist

## 2020-08-03 DIAGNOSIS — R278 Other lack of coordination: Secondary | ICD-10-CM | POA: Insufficient documentation

## 2020-08-03 DIAGNOSIS — R41844 Frontal lobe and executive function deficit: Secondary | ICD-10-CM | POA: Insufficient documentation

## 2020-08-03 DIAGNOSIS — R411 Anterograde amnesia: Secondary | ICD-10-CM | POA: Insufficient documentation

## 2020-08-03 DIAGNOSIS — Z9181 History of falling: Secondary | ICD-10-CM | POA: Insufficient documentation

## 2020-08-03 DIAGNOSIS — R41840 Attention and concentration deficit: Secondary | ICD-10-CM | POA: Insufficient documentation

## 2020-08-06 NOTE — Psych (Signed)
Jonathan [name]  MRN: [id number]  Account: [id number]  Session Start: 08/06/2020 11:00:00 AM  Session Stop: 08/06/2020 11:54:00 AM    Total Treatment Minutes: 54.00 Minutes    Psychology Services  Outpatient Rehabilitation Progress Note    Rehab Diagnosis: TBI  Demographics:            Age: 48Y            Gender: Male    Medications and Allergies: Significant rehabilitation considerations:   NKA  Rehabilitation Precautions/Restrictions:   No rehabilitation precautions.    SUBJECTIVE  Patient Reports: Pt reported the following:  Feeling forgetful  Feeling tired  Feeling irritable    Suicide Risk Screen: Patient has these primary or secondary behavioral health  diagnoses or complaints: Describes self as mildly depressed. 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  Mood was depressed.  No evidence of  psychosis or delusions.  He explicitly denied SI and HI.  BDI score was by  history 46/64.  BDI on 11/26/2019 was 29.    Interventions:   NPSY INDIVID HLTH INTERV INT   NPSY INDIVID HLTH INTERV ADD    ASSESSMENT    Impressions:  Pt reported the following:  Feeling forgetful  Feeling tired  Feeling irritable      PLAN  Continued Psychology services are recommended to address: Continue to follow  patient as needed for emotional support, assistance with coping skills.  Recommendations:  Weekly behavioral intervention    Signed by: Volanda Napoleon, Psy.D. 08/06/2020 11:54:00 AM

## 2020-08-12 NOTE — Psych (Signed)
[name]  MRN: [id number]  Account: [id number]  Session Start: 08/11/2020 11:00:00 AM  Session Stop: 08/11/2020 11:53:00 AM    Total Treatment Minutes: 53.00 Minutes    Psychology Services  Outpatient Rehabilitation Progress Note    Rehab Diagnosis: TBI  Demographics:            Age: 17Y            Gender: Male    Medications and Allergies: Significant rehabilitation considerations:   NKA  Rehabilitation Precautions/Restrictions:   No rehabilitation precautions.    SUBJECTIVE  Patient Reports: Pt reported the following:  Feeling cognitive limitations  Feeling anxious about his health  Feeling discouraged    Suicide Risk Screen: Patient has these primary or secondary behavioral health  diagnoses or complaints: Describes self as mildly depressed. 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  Mood was depressed.  No evidence of  psychosis or delusions.  He explicitly denied SI and HI.  BDI score was by  history 46/64.  BDI on 11/26/2019 was 29.    Interventions:   NPSY INDIVID HLTH INTERV INT   NPSY INDIVID HLTH INTERV ADD    ASSESSMENT    Impressions:  Pt reported the following:  Feeling cognitive limitations  Feeling anxious about his health  Feeling discouraged      PLAN  Continued Psychology services are recommended to address: Continue to follow  patient as needed for emotional support, assistance with coping skills.  Recommendations:  Weekly behavioral intervention    Signed by: Volanda Napoleon, Psy.D. 08/11/2020 11:53:00 AM

## 2020-08-20 NOTE — Psych (Signed)
Jonathan [name]  MRN: [id number]  Account: [id number]  Session Start: 08/20/2020 11:00:00 AM  Session Stop: 08/20/2020 11:53:00 AM    Total Treatment Minutes: 53.00 Minutes    Psychology Services  Outpatient Rehabilitation Progress Note    Rehab Diagnosis: TBI  Demographics:            Age: 74Y            Gender: Male    Medications and Allergies: Significant rehabilitation considerations:   NKA  Rehabilitation Precautions/Restrictions:   No rehabilitation precautions.    SUBJECTIVE  Patient Reports: Pt reported the following:  Feeling anxious about Monday's medical procedure  Feeling worried about being disappointed  Feeling uncertain about the future    Suicide Risk Screen: Patient has these primary or secondary behavioral health  diagnoses or complaints: Describes self as mildly depressed. 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  Mood was depressed.  No evidence of  psychosis or delusions.  He explicitly denied SI and HI.  BDI score was by  history 46/64.  BDI on 11/26/2019 was 29.    Interventions:   NPSY INDIVID HLTH INTERV INT   NPSY INDIVID HLTH INTERV ADD    ASSESSMENT    Impressions:  Pt reported the following:  Feeling anxious about Monday's medical procedure  Feeling worried about being disappointed  Feeling uncertain about the future      PLAN  Continued Psychology services are recommended to address: Continue to follow  patient as needed for emotional support, assistance with coping skills.  Recommendations:  Weekly behavioral intervention    Signed by: Volanda Napoleon, Psy.D. 08/20/2020 11:53:00 AM

## 2020-08-26 NOTE — Psych (Signed)
[name]  MRN: [id number]  Account: [id number]  Session Start: 08/25/2020 12:00:00 AM  Session Stop: 08/25/2020 12:00:00 AM    Total Treatment Minutes:  Minutes    Psychology Services  Outpatient Missed Visit Note    The patient did not attend the therapy appointment on 08/25/2020 at 11am.    Reason:  Undergoing spinal tap for NPH  Future Appointment: The patient has additional appointments.    Signed by: Volanda Napoleon, Psy.D. 08/25/2020 12:00:00 PM

## 2020-08-27 NOTE — Psych (Signed)
NAMEBERKELEY VANAKEN  MRN: [id number]  Account: [id number]  Session Start: 08/27/2020 12:00:00 AM  Session Stop: 08/27/2020 12:00:00 AM    Total Treatment Minutes:  Minutes    Psychology Services  Outpatient Missed Visit Note    The patient did not attend the therapy appointment on 08/27/2020 at 11am.    Reason:  He is inpatient getting spinal tap to test for NPH  Future Appointment: The patient has additional appointments.    Signed by: Volanda Napoleon, Psy.D. 08/27/2020 12:00:00 PM

## 2020-09-01 ENCOUNTER — Ambulatory Visit: Payer: Medicare Other | Attending: Specialist

## 2020-09-01 DIAGNOSIS — R41840 Attention and concentration deficit: Secondary | ICD-10-CM | POA: Insufficient documentation

## 2020-09-01 DIAGNOSIS — R278 Other lack of coordination: Secondary | ICD-10-CM | POA: Insufficient documentation

## 2020-09-01 DIAGNOSIS — Z9181 History of falling: Secondary | ICD-10-CM | POA: Insufficient documentation

## 2020-09-01 DIAGNOSIS — R411 Anterograde amnesia: Secondary | ICD-10-CM | POA: Insufficient documentation

## 2020-09-01 DIAGNOSIS — R41844 Frontal lobe and executive function deficit: Secondary | ICD-10-CM | POA: Insufficient documentation

## 2020-09-02 NOTE — Psych (Signed)
NAMEBRACH BIRDSALL  MRN: [id number]  Account: [id number]  Session Start: 09/01/2020 11:00:00 AM  Session Stop: 09/01/2020 11:53:00 AM    Total Treatment Minutes: 53.00 Minutes    Psychology Services  Outpatient Rehabilitation Progress Note    Rehab Diagnosis: TBI  Demographics:            Age: 74Y            Gender: Male    Medications and Allergies: Significant rehabilitation considerations:   NKA  Rehabilitation Precautions/Restrictions:   No rehabilitation precautions.    SUBJECTIVE  Patient Reports: Pt reported the following:  Feeling more irritable  Feeling discouraged  Feeling pessimistic about the future    Suicide Risk Screen: Patient has these primary or secondary behavioral health  diagnoses or complaints: Describes self as mildly depressed. 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  Mood was depressed.  No evidence of  psychosis or delusions.  He explicitly denied SI and HI.  BDI score was by  history 46/64.  BDI on 11/26/2019 was 29.    Interventions:   NPSY INDIVID HLTH INTERV INT   NPSY INDIVID HLTH INTERV ADD    ASSESSMENT    Impressions:  Pt reported the following:  Feeling more irritable  Feeling discouraged  Feeling pessimistic about the future      PLAN  Continued Psychology services are recommended to address: Continue to follow  patient as needed for emotional support, assistance with coping skills.  Recommendations:  Weekly behavioral intervention    Signed by: Volanda Napoleon, Psy.D. 09/01/2020 11:53:00 AM

## 2020-09-03 NOTE — Psych (Signed)
Jonathan [name]  MRN: [id number]  Account: [id number]  Session Start: 09/03/2020 11:00:00 AM  Session Stop: 09/03/2020 11:53:00 AM    Total Treatment Minutes: 53.00 Minutes    Psychology Services  Outpatient Rehabilitation Progress Note    Rehab Diagnosis: TBI  Demographics:            Age: 49Y            Gender: Male    Medications and Allergies: Significant rehabilitation considerations:   NKA  Rehabilitation Precautions/Restrictions:   No rehabilitation precautions.    SUBJECTIVE  Patient Reports: Pt reported the following:  Feeling high self-regard  Feeling more talkative  Feeling more energy  Feeling less patient  Having a greater difficulty waiting    Suicide Risk Screen: Patient has these primary or secondary behavioral health  diagnoses or complaints: Describes self as mildly depressed. 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  Mood was depressed.  No evidence of  psychosis or delusions.  He explicitly denied SI and HI.  BDI score was by  history 46/64.  BDI on 11/26/2019 was 29.    Interventions:   NPSY INDIVID HLTH INTERV INT   NPSY INDIVID HLTH INTERV ADD    ASSESSMENT    Impressions:  Pt reported the following:  Feeling high self-regard  Feeling more talkative  Feeling more energy  Feeling less patient  Having a greater difficulty waiting      PLAN  Continued Psychology services are recommended to address: Continue to follow  patient as needed for emotional support, assistance with coping skills.  Recommendations:  Weekly behavioral intervention    Signed by: Volanda Napoleon, Psy.D. 09/03/2020 11:53:00 AM

## 2020-09-09 NOTE — Psych (Signed)
NAMEVICTORIA EUCEDA  MRN: [id number]  Account: [id number]  Session Start: 09/08/2020 11:00:00 AM  Session Stop: 09/08/2020 11:53:00 AM    Total Treatment Minutes: 53.00 Minutes    Psychology Services  Outpatient Rehabilitation Progress Note    Rehab Diagnosis: TBI  Demographics:            Age: 38Y            Gender: Male    Medications and Allergies: Significant rehabilitation considerations:   NKA  Rehabilitation Precautions/Restrictions:   No rehabilitation precautions.    SUBJECTIVE  Patient Reports: Pt reported the following:  Feeling more depressed  Feeling more self-critical  Feeling pessimisstic    Suicide Risk Screen: Patient has these primary or secondary behavioral health  diagnoses or complaints: Describes self as mildly depressed. 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  Mood was depressed.  No evidence of  psychosis or delusions.  He explicitly denied SI and HI.  BDI score was by  history 46/64.  BDI on 11/26/2019 was 29.    Interventions:   NPSY INDIVID HLTH INTERV INT   NPSY INDIVID HLTH INTERV ADD    ASSESSMENT    Impressions:  Pt reported the following:  Feeling more depressed  Feeling more self-critical  Feeling pessimisstic      PLAN  Continued Psychology services are recommended to address: Continue to follow  patient as needed for emotional support, assistance with coping skills.  Recommendations:  Weekly behavioral intervention    Signed by: Volanda Napoleon, Psy.D. 09/08/2020 11:53:00 AM

## 2020-09-10 NOTE — Psych (Signed)
[name]  MRN: [id number]  Account: [id number]  Session Start: 09/10/2020 11:00:00 AM  Session Stop: 09/10/2020 11:53:00 AM    Total Treatment Minutes: 53.00 Minutes    Psychology Services  Outpatient Rehabilitation Progress Note    Rehab Diagnosis: TBI  Demographics:            Age: 35Y            Gender: Male    Medications and Allergies: Significant rehabilitation considerations:   NKA  Rehabilitation Precautions/Restrictions:   No rehabilitation precautions.    SUBJECTIVE  Patient Reports: Pt reported the following:  Feeling depressed  Feeling self-critical  Feeling self-doubt and second guessing    Suicide Risk Screen: Patient has these primary or secondary behavioral health  diagnoses or complaints: Describes self as mildly depressed. 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  Mood was depressed.  No evidence of  psychosis or delusions.  He explicitly denied SI and HI.  BDI score was by  history 46/64.  BDI on 11/26/2019 was 29.    Interventions:   NPSY INDIVID HLTH INTERV INT   NPSY INDIVID HLTH INTERV ADD    ASSESSMENT    Impressions:  Pt reported the following:  Feeling depressed  Feeling self-critical  Feeling self-doubt and second guessing      PLAN  Continued Psychology services are recommended to address: Continue to follow  patient as needed for emotional support, assistance with coping skills.  Recommendations:  Weekly behavioral intervention    Signed by: Volanda Napoleon, Psy.D. 09/10/2020 11:53:00 AM

## 2020-09-16 NOTE — Psych (Signed)
Jonathan [name]  MRN: [id number]  Account: [id number]  Session Start: 09/15/2020 11:00:00 AM  Session Stop: 09/15/2020 11:53:00 AM    Total Treatment Minutes: 53.00 Minutes    Psychology Services  Outpatient Rehabilitation Progress Note    Rehab Diagnosis: TBI  Demographics:            Age: 45Y            Gender: Male    Medications and Allergies: Significant rehabilitation considerations:   NKA  Rehabilitation Precautions/Restrictions:   No rehabilitation precautions.    SUBJECTIVE  Patient Reports: Pt reported the following:  Feeling self-critical  Feeling pessimistic about the future  Feeling low self-regard  Feeling depressed    Suicide Risk Screen: Patient has these primary or secondary behavioral health  diagnoses or complaints: Describes self as mildly depressed. 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  Mood was depressed.  No evidence of  psychosis or delusions.  He explicitly denied SI and HI.  BDI score was by  history 46/64.  BDI on 11/26/2019 was 29.    Interventions:   NPSY INDIVID HLTH INTERV INT   NPSY INDIVID HLTH INTERV ADD    ASSESSMENT    Impressions:  Pt reported the following:  Feeling self-critical  Feeling pessimistic about the future  Feeling low self-regard  Feeling depressed      PLAN  Continued Psychology services are recommended to address: Continue to follow  patient as needed for emotional support, assistance with coping skills.  Recommendations:  Weekly behavioral intervention    Signed by: Volanda Napoleon, Psy.D. 09/15/2020 11:53:00 AM

## 2020-09-17 NOTE — Psych (Signed)
NAMERONALD LONDO  MRN: [id number]  Account: [id number]  Session Start: 09/17/2020 11:00:00 AM  Session Stop: 09/17/2020 11:53:00 AM    Total Treatment Minutes: 53.00 Minutes    Psychology Services  Outpatient Rehabilitation Progress Note    Rehab Diagnosis: TBI  Demographics:            Age: 72Y            Gender: Male    Medications and Allergies: Significant rehabilitation considerations:   NKA  Rehabilitation Precautions/Restrictions:   No rehabilitation precautions.    SUBJECTIVE  Patient Reports: Pt reported the following:  Feeling self-critical  Feeling irritable  Feelig lowered frustration tolerance    Suicide Risk Screen: Patient has these primary or secondary behavioral health  diagnoses or complaints: Describes self as mildly depressed. 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  Mood was depressed.  No evidence of  psychosis or delusions.  He explicitly denied SI and HI.  BDI score was by  history 46/64.  BDI on 11/26/2019 was 29.    Interventions:   NPSY INDIVID HLTH INTERV INT   NPSY INDIVID HLTH INTERV ADD    ASSESSMENT    Impressions:  Pt reported the following:  Feeling self-critical  Feeling irritable  Feelig lowered frustration tolerance      PLAN  Continued Psychology services are recommended to address: Continue to follow  patient as needed for emotional support, assistance with coping skills.  Recommendations:  Weekly behavioral intervention    Signed by: Volanda Napoleon, Psy.D. 09/17/2020 11:53:00 AM

## 2020-09-23 ENCOUNTER — Telehealth (INDEPENDENT_AMBULATORY_CARE_PROVIDER_SITE_OTHER): Payer: Self-pay

## 2020-09-23 NOTE — Telephone Encounter (Signed)
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  We cannot comment on its battery, or how much he uses it.  He has declined a replacement.  We cannot provide assurance regarding the dependability of his device.  mw

## 2020-09-23 NOTE — Psych (Signed)
Jonathan [name]  MRN: [id number]  Account: [id number]  Session Start: 09/22/2020 11:00:00 AM  Session Stop: 09/22/2020 11:53:00 AM    Total Treatment Minutes: 53.00 Minutes    Psychology Services  Outpatient Rehabilitation Progress Note    Rehab Diagnosis: TBI  Demographics:            Age: 73Y            Gender: Male    Medications and Allergies: Significant rehabilitation considerations:   NKA  Rehabilitation Precautions/Restrictions:   No rehabilitation precautions.    SUBJECTIVE  Patient Reports: Pt reported the following:  Feeling self-critical  Feeling guilty  Feeling depressed  Feeling pessimistic about his future  Feeling lowered frustration tolerance    Suicide Risk Screen: Patient has these primary or secondary behavioral health  diagnoses or complaints: Describes self as mildly depressed. 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  Mood was depressed.  No evidence of  psychosis or delusions.  He explicitly denied SI and HI.  BDI score was by  history 46/64.  BDI on 11/26/2019 was 29.      Interventions:   NPSY INDIVID HLTH INTERV INT   NPSY INDIVID HLTH INTERV ADD    ASSESSMENT    Impressions:  Pt reported the following:  Feeling self-critical  Feeling guilty  Feeling depressed  Feeling pessimistic about his future  Feeling lowered frustration tolerance      PLAN  Continued Psychology services are recommended to address: Continue to follow  patient as needed for emotional support, assistance with coping skills.  Recommendations:  Weekly behavioral intervention    Signed by: Volanda Napoleon, Psy.D. 09/22/2020 11:53:00 AM

## 2020-09-23 NOTE — Telephone Encounter (Signed)
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    Patient was last seen September 2021. He will email me the forms the [facility] gave him. Requests copy of clearance be sent to him as well.    Okay to draft clearance?

## 2020-09-24 ENCOUNTER — Encounter (INDEPENDENT_AMBULATORY_CARE_PROVIDER_SITE_OTHER): Payer: Self-pay | Admitting: Clinical Cardiac Electrophysiology

## 2020-09-24 NOTE — Psych (Signed)
Jonathan [name]  MRN: [id number]  Account: [id number]  Session Start: 09/24/2020 11:00:00 AM  Session Stop: 09/24/2020 11:53:00 AM    Total Treatment Minutes: 53.00 Minutes    Psychology Services  Outpatient Rehabilitation Progress Note    Rehab Diagnosis: TBI  Demographics:            Age: 66Y            Gender: Male    Medications and Allergies: Significant rehabilitation considerations:   NKA  Rehabilitation Precautions/Restrictions:   No rehabilitation precautions.    SUBJECTIVE  Patient Reports: Pt reported the following:  Feeling discouraged about health  Waiting for shunt placement causing frustration  Feeling tearful    Suicide Risk Screen: Patient has these primary or secondary behavioral health  diagnoses or complaints: Describes self as mildly depressed. 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  Mood was depressed.  No evidence of  psychosis or delusions.  He explicitly denied SI and HI.  BDI score was by  history 46/64.  BDI on 11/26/2019 was 29.    Interventions:   NPSY INDIVID HLTH INTERV INT   NPSY INDIVID HLTH INTERV ADD    ASSESSMENT    Impressions:  Pt reported the following:  Feeling discouraged about health  Waiting for shunt placement causing frustration  Feeling tearful    PLAN  Continued Psychology services are recommended to address: Continue to follow  patient as needed for emotional support, assistance with coping skills.  Recommendations:  Weekly behavioral intervention    Signed by: Volanda Napoleon, Psy.D. 09/24/2020 11:53:00 AM

## 2020-09-24 NOTE — Telephone Encounter (Addendum)
Letter drafted with message below. Will fax to [phone] and email copy to patient as requested.

## 2020-09-29 ENCOUNTER — Ambulatory Visit: Payer: Medicare Other | Attending: Specialist

## 2020-09-29 DIAGNOSIS — R278 Other lack of coordination: Secondary | ICD-10-CM | POA: Insufficient documentation

## 2020-09-29 DIAGNOSIS — Z9181 History of falling: Secondary | ICD-10-CM | POA: Insufficient documentation

## 2020-09-29 DIAGNOSIS — R41844 Frontal lobe and executive function deficit: Secondary | ICD-10-CM | POA: Insufficient documentation

## 2020-09-29 DIAGNOSIS — R411 Anterograde amnesia: Secondary | ICD-10-CM | POA: Insufficient documentation

## 2020-09-29 DIAGNOSIS — R41840 Attention and concentration deficit: Secondary | ICD-10-CM | POA: Insufficient documentation

## 2020-09-30 NOTE — Psych (Signed)
NAMECLYDELL [name]  MRN: [id number]  Account: [id number]  Session Start: 09/29/2020 11:00:00 AM  Session Stop: 09/29/2020 11:53:00 AM    Total Treatment Minutes: 53.00 Minutes    Psychology Services  Outpatient Rehabilitation Progress Note    Rehab Diagnosis: TBI  Demographics:            Age: 15Y            Gender: Male    Medications and Allergies: Significant rehabilitation considerations:   NKA  Rehabilitation Precautions/Restrictions:   No rehabilitation precautions.    SUBJECTIVE  Patient Reports: Pt reported the following:  Feeling pessimistic about shunt placement  Feeling worried about dying during procedure  Feeling anxious about his future    Suicide Risk Screen: Patient has these primary or secondary behavioral health  diagnoses or complaints: Describes self as mildly depressed. 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  Mood was depressed.  No evidence of  psychosis or delusions.  He explicitly denied SI and HI.  BDI score was by  history 46/64.  BDI on 11/26/2019 was 29.      Interventions:   NPSY INDIVID HLTH INTERV INT   NPSY INDIVID HLTH INTERV ADD    ASSESSMENT    Impressions:  Pt reported the following:  Feeling pessimistic about shunt placement  Feeling worried about dying during procedure  Feeling anxious about his future          Recommendations:  Weekly behavioral intervention    Signed by: Volanda Napoleon, Psy.D. 09/29/2020 11:53:00 AM

## 2020-10-01 NOTE — Psych (Signed)
Jonathan [name]  MRN: [id number]  Account: [id number]  Session Start: 10/01/2020 12:00:00 AM  Session Stop: 10/01/2020 12:00:00 AM    Total Treatment Minutes:  Minutes    Psychology Services  Outpatient Missed Visit Note    The patient did not attend the therapy appointment on 10/01/2020 at 11am.    Reason:  Having shunt put in  Future Appointment: The patient has additional appointments.    Signed by: Volanda Napoleon, Psy.D. 10/01/2020 7:56:00 AM

## 2020-10-02 ENCOUNTER — Encounter (INDEPENDENT_AMBULATORY_CARE_PROVIDER_SITE_OTHER): Payer: Self-pay

## 2020-10-07 NOTE — Psych (Signed)
[name]  MRN: [id number]  Account: [id number]  Session Start: 10/06/2020 11:00:00 AM  Session Stop: 10/06/2020 11:53:00 AM    Total Treatment Minutes: 53.00 Minutes    Psychology Services  Outpatient Rehabilitation Progress Note    Rehab Diagnosis: TBI  Demographics:            Age: 54Y            Gender: Male    Medications and Allergies: Significant rehabilitation considerations:   NKA  Rehabilitation Precautions/Restrictions:   No rehabilitation precautions.    SUBJECTIVE  Patient Reports: Pt reported the following:  Feeling discouraged  Not feeling helped by shunt  Feeling disappointed    Suicide Risk Screen: Patient has these primary or secondary behavioral health  diagnoses or complaints: Describes self as mildly depressed. 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  Mood was depressed.  No evidence of  psychosis or delusions.  He explicitly denied SI and HI.  BDI score was by  history 46/64.  BDI on 11/26/2019 was 29.    Interventions:   NPSY INDIVID HLTH INTERV INT   NPSY INDIVID HLTH INTERV ADD    ASSESSMENT    Impressions:  Pt reported the following:  Feeling discouraged  Not feeling helped by shunt  Feeling disappointed      PLAN  Continued Psychology services are recommended to address: Continue to follow  patient as needed for emotional support, assistance with coping skills.  Recommendations:  Weekly  behavioral intervention    Signed by: Jonathan [name], Psy.D. 10/06/2020 11:53:00 AM

## 2020-10-08 NOTE — Psych (Signed)
[name]  MRN: [id number]  Account: [id number]  Session Start: 10/08/2020 11:00:00 AM  Session Stop: 10/08/2020 11:53:00 AM    Total Treatment Minutes: 53.00 Minutes    Psychology Services  Outpatient Rehabilitation Progress Note    Rehab Diagnosis: TBI  Demographics:            Age: 35Y            Gender: Male    Medications and Allergies: Significant rehabilitation considerations:   NKA  Rehabilitation Precautions/Restrictions:   No rehabilitation precautions.    SUBJECTIVE  Patient Reports: Pt reported the following:  Feeling discouraged  Feeling lowered frustration tolerance  Feeling sad  Feeling guilty    Suicide Risk Screen: Patient has these primary or secondary behavioral health  diagnoses or complaints: Describes self as mildly depressed. 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  Mood was depressed.  No evidence of  psychosis or delusions.  He explicitly denied SI and HI.  BDI score was by  history 46/64.  BDI on 11/26/2019 was 29.    Interventions:   NPSY INDIVID HLTH INTERV INT   NPSY INDIVID HLTH INTERV ADD    ASSESSMENT    Impressions:  Pt reported the following:  Feeling discouraged  Feeling lowered frustration tolerance  Feeling sad  Feeling guilty    PLAN  Continued Psychology services are recommended to address: Continue to follow  patient as needed for emotional support, assistance with coping skills.  Recommendations:  Weekly behavioral intervention    Signed by: Volanda Napoleon, Psy.D. 10/08/2020 11:53:00 AM

## 2020-10-14 NOTE — Psych (Signed)
Jonathan [name]  MRN: [id number]  Account: [id number]  Session Start: 10/13/2020 11:00:00 AM  Session Stop: 10/13/2020 11:53:00 AM    Total Treatment Minutes: 53.00 Minutes    Psychology Services  Outpatient Rehabilitation Progress Note    Rehab Diagnosis: TBI  Demographics:            Age: 51Y            Gender: Male      SUBJECTIVE  Patient Reports: Pt reported the following:  Feeling more alert  Feeling continued sadness and discouragement  Holding a negative self-evaluation    Suicide Risk Screen: Patient has these primary or secondary behavioral health  diagnoses or complaints: Describes self as mildly depressed. 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  Mood was depressed.  No evidence of  psychosis or delusions.  He explicitly denied SI and HI.  BDI score was by  history 46/64.  BDI on 11/26/2019 was 29.    Interventions:   NPSY INDIVID HLTH INTERV INT   NPSY INDIVID HLTH INTERV ADD    ASSESSMENT    Impressions:  Pt reported the following:  Feeling more alert  Feeling continued sadness and discouragement  Holding a negative self-evaluation    PLAN    Recommendations:  Weekly behavioral intervention    Signed by: Volanda Napoleon, Psy.D. 10/13/2020 11:53:00 AM

## 2020-10-15 NOTE — Psych (Signed)
Jonathan [name]  MRN: [id number]  Account: [id number]  Session Start: 10/15/2020 11:00:00 AM  Session Stop: 10/15/2020 11:30:00 AM    Total Treatment Minutes: 30.00 Minutes    Psychology Services  Outpatient Rehabilitation Progress Note    Rehab Diagnosis: TBI  Demographics:            Age: 79Y            Gender: Male    Medications and Allergies: Significant rehabilitation considerations:   NKA  Rehabilitation Precautions/Restrictions:   No rehabilitation precautions.    SUBJECTIVE  Patient Reports: Pt reported the following:  Feeling sad  Feeling discouraged  Feeling pessimistic    Suicide Risk Screen: Patient has these primary or secondary behavioral health  diagnoses or complaints: Describes self as mildly depressed. 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  Mood was depressed.  No evidence of  psychosis or delusions.  He explicitly denied SI and HI.  BDI score was by  history 46/64.  BDI on 11/26/2019 was 29.    Interventions:   NPSY INDIVID HLTH INTERV INT    ASSESSMENT    Impressions:  Pt reported the following:  Feeling sad  Feeling discouraged  Feeling pessimistic      PLAN    Recommendations:  Weekly behavioral intervention    Signed by: Volanda Napoleon, Psy.D. 10/15/2020 11:30:00 AM

## 2020-10-21 ENCOUNTER — Encounter (INDEPENDENT_AMBULATORY_CARE_PROVIDER_SITE_OTHER): Payer: Medicare Other | Admitting: Clinical Cardiac Electrophysiology

## 2020-10-22 NOTE — Psych (Signed)
Jonathan [name]  MRN: [id number]  Account: [id number]  Session Start: 10/22/2020 11:00:00 AM  Session Stop: 10/22/2020 11:53:00 AM    Total Treatment Minutes: 53.00 Minutes    Psychology Services  Outpatient Rehabilitation Progress Note    Rehab Diagnosis: TBI  Demographics:            Age: 54Y            Gender: Male    SUBJECTIVE  Patient Reports: Pt reported the following:  Shunt was adjusted follow by improvements  Feeling less discouraged  Feeling self-doubt    Suicide Risk Screen: Patient has these primary or secondary behavioral health  diagnoses or complaints: Describes self as mildly depressed. 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  Mood was depressed.  No evidence of  psychosis or delusions.  He explicitly denied SI and HI.  BDI score was by  history 46/64.  BDI on 11/26/2019 was 29.    Interventions:   NPSY INDIVID HLTH INTERV INT   NPSY INDIVID HLTH INTERV ADD    ASSESSMENT    Impressions:  Pt reported the following:  Shunt was adjusted follow by improvements  Feeling less discouraged  Feeling self-doubt      PLAN    Recommendations:  Weekly behavioral intervention    Signed by: Volanda Napoleon, Psy.D. 10/22/2020 11:53:00 AM

## 2020-10-22 NOTE — Psych (Signed)
NAMEHUMZA [name]  MRN: [id number]  Account: [id number]  Session Start: 10/20/2020 11:00:00 AM  Session Stop: 10/20/2020 11:53:00 AM    Total Treatment Minutes: 53.00 Minutes    Psychology Services  Outpatient Rehabilitation Progress Note    Rehab Diagnosis: TBI  Demographics:            Age: 75Y            Gender: Male    Medications and Allergies: Significant rehabilitation considerations:   NKA  Rehabilitation Precautions/Restrictions:   No rehabilitation precautions.    SUBJECTIVE  Patient Reports: Pt reported the following:  Feeling worse  Getting shunt adjusted tomorrow    Suicide Risk Screen: Patient has these primary or secondary behavioral health  diagnoses or complaints: Describes self as mildly depressed. Depression related  to medical situatoin Denied SI and HI    OBJECTIVE  General Observation: Arrived on time.  Mood was depressed.  No evidence of  psychosis or delusions.  He explicitly denied SI and HI.  BDI score was by  history 46/64.  BDI on 11/26/2019 was 29.    Interventions:   NPSY INDIVID HLTH INTERV INT   NPSY INDIVID HLTH INTERV ADD    ASSESSMENT    Impressions:  Pt reported the following:  Feeling worse  Getting shunt adjusted tomorrow      PLAN    Recommendations:  Weekly behavioral intervention    Signed by: Volanda Napoleon, Psy.D. 10/20/2020 11:53:00 AM

## 2020-10-28 NOTE — Psych (Signed)
NAMELINKOLN ALKIRE  MRN: [id number]  Account: [id number]  Session Start: 10/27/2020 11:00:00 AM  Session Stop: 10/27/2020 11:53:00 AM    Total Treatment Minutes: 53.00 Minutes    Psychology Services  Outpatient Rehabilitation Progress Note    Rehab Diagnosis: TBI  Demographics:            Age: 44Y            Gender: Male      SUBJECTIVE  Patient Reports: Pt reported the following:  Feeling less depressed  Feeling more cognitively alert    Suicide Risk Screen: Patient has these primary or secondary behavioral health  diagnoses or complaints: Describes self as mildly depressed. 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  Mood was depressed.  No evidence of  psychosis or delusions.  He explicitly denied SI and HI.  BDI score was by  history 46/64.  BDI on 11/26/2019 was 29.    Interventions:   NPSY INDIVID HLTH INTERV INT   NPSY INDIVID HLTH INTERV ADD    ASSESSMENT    Impressions:  Pt reported the following:  Feeling less depressed  Feeling more cognitively alert      PLAN    Recommendations:  Weekly  behavioral intervention    Signed by: Volanda Napoleon, Psy.D. 10/27/2020 11:53:00 AM

## 2020-10-29 NOTE — Psych (Signed)
Jonathan [name]  MRN: [id number]  Account: [id number]  Session Start: 10/29/2020 12:00:00 AM  Session Stop: 10/29/2020 12:00:00 AM    Total Treatment Minutes:  Minutes    Psychology Services  Outpatient Missed Visit Note    The patient did not attend the therapy appointment on 10/29/2020 at 11am.    Reason:  Patient did not show for appointment.  Future Appointment: The patient has additional appointments.    Signed by: Kyra Searles, Psy.D. 10/29/2020 12:00:00 PM

## 2020-10-30 ENCOUNTER — Ambulatory Visit: Payer: Medicare Other | Attending: Specialist

## 2020-10-30 DIAGNOSIS — R278 Other lack of coordination: Secondary | ICD-10-CM | POA: Insufficient documentation

## 2020-10-30 DIAGNOSIS — F3289 Other specified depressive episodes: Secondary | ICD-10-CM | POA: Insufficient documentation

## 2020-10-30 DIAGNOSIS — R41840 Attention and concentration deficit: Secondary | ICD-10-CM | POA: Insufficient documentation

## 2020-10-30 DIAGNOSIS — H5350 Unspecified color vision deficiencies: Secondary | ICD-10-CM | POA: Insufficient documentation

## 2020-10-30 DIAGNOSIS — R41844 Frontal lobe and executive function deficit: Secondary | ICD-10-CM | POA: Insufficient documentation

## 2020-11-03 NOTE — Psych (Signed)
[name]  MRN: [id number]  Account: [id number]  Session Start: 11/03/2020 12:00:00 AM  Session Stop: 11/03/2020 12:00:00 AM    Total Treatment Minutes:  Minutes    Psychology Services  Outpatient Missed Visit Note    The patient did not attend the therapy appointment on 11/03/2020 at 11am.    Reason:  Scheduling conflict.  Future Appointment: The patient has additional appointments.    Signed by: Volanda Napoleon, Psy.D. 11/03/2020 7:42:00 AM

## 2020-11-05 NOTE — Psych (Signed)
Jonathan [name]  MRN: [id number]  Account: [id number]  Session Start: 11/05/2020 11:00:00 AM  Session Stop: 11/05/2020 11:53:00 AM    Total Treatment Minutes: 53.00 Minutes    Psychology Services  Outpatient Rehabilitation Progress Note    Rehab Diagnosis: TBI  Demographics:            Age: 1Y            Gender: Male    Medications and Allergies: Significant rehabilitation considerations:   NKA      SUBJECTIVE  Patient Reports: Pt reported the following:  Feeling more depressed  Feeling self-critical  Feeling angry about needing help    Suicide Risk Screen: Patient has these primary or secondary behavioral health  diagnoses or complaints: Describes self as mildly depressed. 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  Mood was depressed.  No evidence of  psychosis or delusions.  He explicitly denied SI and HI.  BDI score was by  history 46/64.  BDI on 11/26/2019 was 29.    Interventions:   NPSY INDIVID HLTH INTERV INT   NPSY INDIVID HLTH INTERV ADD    ASSESSMENT    Impressions:  Pt reported the following:  Feeling more depressed  Feeling self-critical  Feeling angry about needing help      PLAN    Recommendations:  Weekly behavioral intervention    Signed by: Volanda Napoleon, Psy.D. 11/05/2020 11:53:00 AM

## 2020-11-13 NOTE — Psych (Signed)
Jonathan [name]  MRN: [id number]  Account: [id number]  Session Start: 11/12/2020 11:00:00 AM  Session Stop: 11/12/2020 11:53:00 AM    Total Treatment Minutes: 53.00 Minutes    Psychology Services  Outpatient Rehabilitation Progress Note    Rehab Diagnosis: TBI  Demographics:            Age: 66Y            Gender: Male    SUBJECTIVE  Patient Reports: Pt reported the following:  Feeling self-critical  Feeling low self-regard  Feeling tearful    Suicide Risk Screen: Patient has these primary or secondary behavioral health  diagnoses or complaints: Describes self as mildly depressed. 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  Mood was depressed.  No evidence of  psychosis or delusions.  He explicitly denied SI and HI.  BDI score was by  history 46/64.  BDI on 11/26/2019 was 29.    Interventions:   NPSY INDIVID HLTH INTERV INT   NPSY INDIVID HLTH INTERV ADD    ASSESSMENT    Impressions:  Pt reported the following:  Feeling self-critical  Feeling low self-regard  Feeling tearful      PLAN    Recommendations:  Weekly  behavioral intervention    Signed by: Volanda Napoleon, Psy.D. 11/12/2020 11:53:00 AM

## 2020-11-17 NOTE — Plan of Care (Signed)
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 minutes or less  2    G91.9     Hydrocephalus, unspecified                       11/17/2020  Therapy Diagnosis:  Rank Code      Description     Date of Onset    1    R27.8     Other lack of coordination                       05/16/2019  2    R41.844   Frontal lobe and executive function deficit      05/16/2019  3    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  STG 2) (10 visits from initial evaluation on 11/17/2020): Pt will read short  newspaper article and recall 50% of detail in order to improve comprehension and  visual tolerance for small print reading.  STG 3) (10 visits from initial evaluation on 11/17/2020): Pt will improve typing  speed based on one minute typing test to at least 26 words per minute in order  to demonstrate improvement in bilateral coordination required for computer based  IADLs.    LTG 1) (15 visits from initial evaluation on 11/17/2020): Pt will perform morning  ADL routine with no increase in time per pt report and with grossly modified  independence in order to decrease burden of effort for morning ADLs.  LTG 2) (15 visits from  initial evaluation on 11/17/2020): Pt will perform hot  meal prep task requiring at least 50% divided attention with supervision, in  order to decrease CG burden and improve pt independence in meal prep tasks.  LTG 3) (15 visits from initial evaluation on 11/17/2020): Pt will read full  length newspaper article and recall at least 90% of detail in order to improve  visual tolerance required for return to meaningful reading occupation.  LTG 3) (15 visits from initial evaluation on 11/17/2020): Pt will improve typing  speed to at least 35 words in one minute typing test in order to decrease burden  of effort with computer level IADLs.  Status:  Functions/Structures Problem List and Goals:  Other: LTG 5) (15 visits from initial evaluation on 11/17/2020): Pt will  demonstrate bilateral improvement  in box and block test by at least 5 blocks in  order to demonstrate MDC required to indicate improvement in gross motor  coordination required for ADL/IADL performance.  LTG 6) (15 visits from initial evaluation on 11/17/2020): Pt will improve left  hand 9 hole peg test score to at least 23 seconds in order to improve  performance to norm value to increase fine motor coordination required for ADLs.    LTG 7) (15 visits from initial evaluation on 11/17/2020): Pt will improve  functional reach score to at least 15 inches in order to decrease pt fall risk  and improve safety with community level IADLs.  Status:    Recommendations: Occupational Therapy is recommended for 2x/week for 15 visits  from initial evaluation on 11/17/2020 Occupational Therapy treatment is to  include: Manual Therapy.  Neuromuscular Re-education.  Physical Performance Test.  Therapeutic Activity.  Therapeutic Exercise.  Self Care/Home Management.  Assisted Technology Assessment.  Community/Work Integration.    Physician Certification: This is to certify that the above named patient, who is  under my care, requires skilled Occupational Therapy services as  described in  the above treatment plan.  I further certify that the services outlined in this  plan are skilled and medically necessary.  I have reviewed this plan for  rehabilitation services, and I recommend that these services continue 90 Days  from initial evaluation/ plan of care on 11/17/2020  to meet the goals stated  above.    Physician signature: __________________ MD,  Date of certification:  ____/____/____    Signed by: Maryfrances Bunnell, OTR/L 11/17/2020 11:00:00 AM
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
## 2020-11-17 NOTE — Psych (Signed)
Jonathan [name]  MRN: [id number]  Account: [id number]  Session Start: 11/17/2020 11:00:00 AM  Session Stop: 11/17/2020 11:53:00 AM    Total Treatment Minutes: 53.00 Minutes    Psychology Services  Outpatient Rehabilitation Progress Note    Rehab Diagnosis: TBI  Demographics:            Age: 74Y            Gender: Male      SUBJECTIVE  Patient Reports: Pt reported the following:  Feeling self-critical  Feeling guilty  Holding negative view of self and future    Suicide Risk Screen: Patient has these primary or secondary behavioral health  diagnoses or complaints: Describes self as mildly depressed. 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  Mood was depressed.  No evidence of  psychosis or delusions.  He explicitly denied SI and HI.  BDI score was by  history 46/64.  BDI on 11/26/2019 was 29.    Interventions:   NPSY INDIVID HLTH INTERV INT   NPSY INDIVID HLTH INTERV ADD    ASSESSMENT    Impressions:  Pt reported the following:  Feeling self-critical  Feeling guilty  Holding negative view of self and future      PLAN    Recommendations:  Weekly behavioral intervention    Signed by: Volanda Napoleon, Psy.D. 11/17/2020 11:53:00 AM

## 2020-11-19 NOTE — Psych (Signed)
Jonathan [name]  MRN: [id number]  Account: [id number]  Session Start: 11/19/2020 11:00:00 AM  Session Stop: 11/19/2020 11:53:00 AM    Total Treatment Minutes: 53.00 Minutes    Psychology Services  Outpatient Rehabilitation Progress Note    Rehab Diagnosis: TBI  Demographics:            Age: 49Y            Gender: Male      SUBJECTIVE  Patient Reports: Pt reported the following:  Feeling depressed  Feeling pessimistic  Forecasting a negative view of the future    Suicide Risk Screen: Patient has these primary or secondary behavioral health  diagnoses or complaints: Describes self as mildly depressed. 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  Mood was depressed.  No evidence of  psychosis or delusions.  He explicitly denied SI and HI.  BDI score was by  history 46/64.  BDI on 11/26/2019 was 29.    Interventions:   NPSY INDIVID HLTH INTERV INT   NPSY INDIVID HLTH INTERV ADD    ASSESSMENT    Impressions:  Pt reported the following:  Feeling depressed  Feeling pessimistic  Forecasting a negative view of the future    PLAN    Recommendations:  Weekly behaivoral intervention    Signed by: Volanda Napoleon, Psy.D. 11/19/2020 11:53:00 AM

## 2020-11-25 NOTE — Psych (Signed)
Jonathan [name]  MRN: [id number]  Account: [id number]  Session Start: 11/24/2020 11:00:00 AM  Session Stop: 11/24/2020 11:53:00 AM    Total Treatment Minutes: 53.00 Minutes    Psychology Services  Outpatient Rehabilitation Progress Note    Rehab Diagnosis: TBI  Demographics:            Age: 47Y            Gender: Male      SUBJECTIVE  Patient Reports: Pt reported the following:  Feeling discouraged  Feeling less hopefull  Feeling supported by family    Suicide Risk Screen: Patient has these primary or secondary behavioral health  diagnoses or complaints: Describes self as mildly depressed. 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  Mood was depressed.  No evidence of  psychosis or delusions.  He explicitly denied SI and HI.  BDI score was by  history 46/64.  BDI on 11/26/2019 was 29.    Interventions:   NPSY INDIVID HLTH INTERV INT   NPSY INDIVID HLTH INTERV ADD    ASSESSMENT    Impressions:  Pt reported the following:  Feeling discouraged  Feeling less hopefull  Feeling supported by family    PLAN    Recommendations:  Weekly behavioral intervention    Signed by: Volanda Napoleon, Psy.D. 11/24/2020 11:53:00 AM

## 2020-11-30 ENCOUNTER — Ambulatory Visit: Payer: Medicare Other | Attending: Specialist

## 2020-11-30 ENCOUNTER — Other Ambulatory Visit: Payer: Self-pay | Admitting: Orthopaedic Surgery

## 2020-11-30 DIAGNOSIS — H5350 Unspecified color vision deficiencies: Secondary | ICD-10-CM | POA: Insufficient documentation

## 2020-11-30 DIAGNOSIS — R41840 Attention and concentration deficit: Secondary | ICD-10-CM | POA: Insufficient documentation

## 2020-11-30 DIAGNOSIS — M4800 Spinal stenosis, site unspecified: Secondary | ICD-10-CM

## 2020-11-30 DIAGNOSIS — R278 Other lack of coordination: Secondary | ICD-10-CM | POA: Insufficient documentation

## 2020-11-30 DIAGNOSIS — R41844 Frontal lobe and executive function deficit: Secondary | ICD-10-CM | POA: Insufficient documentation

## 2020-11-30 DIAGNOSIS — F3289 Other specified depressive episodes: Secondary | ICD-10-CM | POA: Insufficient documentation

## 2020-12-07 ENCOUNTER — Ambulatory Visit
Admission: RE | Admit: 2020-12-07 | Discharge: 2020-12-07 | Disposition: A | Discharge status: Home / Self Care | Payer: Medicare Other | Source: Ambulatory Visit | Attending: Orthopaedic Surgery | Admitting: Orthopaedic Surgery

## 2020-12-07 DIAGNOSIS — M48061 Spinal stenosis, lumbar region without neurogenic claudication: Secondary | ICD-10-CM | POA: Insufficient documentation

## 2020-12-07 DIAGNOSIS — M4800 Spinal stenosis, site unspecified: Secondary | ICD-10-CM

## 2020-12-07 DIAGNOSIS — M488X6 Other specified spondylopathies, lumbar region: Secondary | ICD-10-CM | POA: Insufficient documentation

## 2020-12-07 DIAGNOSIS — M5136 Other intervertebral disc degeneration, lumbar region: Secondary | ICD-10-CM | POA: Insufficient documentation

## 2020-12-07 LAB — WHOLE BLOOD CREATININE WITH GFR POCT
GFR POCT: >60 mL/min/{1.73_m2} (ref >60)
Whole Blood Creatinine POCT: 0.6 mg/dL (ref 0.5–1.1)

## 2020-12-07 MED ORDER — IODIXANOL 320 MG/ML IV SOLN
100.00 mL | Freq: Once | INTRAVENOUS | Status: AC | PRN
Start: 2020-12-07 — End: 2020-12-07
  Administered 2020-12-07: 100 mL via INTRAVENOUS

## 2020-12-08 NOTE — Psych (Signed)
NAMEMARION ROSENBERRY  MRN: [id number]  Account: [id number]  Session Start: 12/08/2020 11:00:00 AM  Session Stop: 12/08/2020 11:53:00 AM    Total Treatment Minutes: 53.00 Minutes    Psychology Services  Outpatient Rehabilitation Progress Note    Rehab Diagnosis: TBI  Demographics:            Age: 50Y            Gender: Male      SUBJECTIVE  Patient Reports: Pt reported the following:  Feeling depressed  Feeling unable to connect to good inner experiences  Feeling discouraged    Suicide Risk Screen: Patient has these primary or secondary behavioral health  diagnoses or complaints: Describes self as mildly depressed. 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  Mood was depressed.  No evidence of  psychosis or delusions.  He explicitly denied SI and HI.  BDI score was by  history 46/64.  BDI on 11/26/2019 was 29.    Interventions:   NPSY INDIVID HLTH INTERV INT   NPSY INDIVID HLTH INTERV ADD    ASSESSMENT    Impressions:  Pt reported the following:  Feeling depressed  Feeling unable to connect to good inner experiences  Feeling discouraged      PLAN    Recommendations:  Weekly behavioral intervention    Signed by: Volanda Napoleon, Psy.D. 12/08/2020 11:53:00 AM

## 2020-12-08 NOTE — Rehab Progress Note ([facility]) (Signed)
NAMEHENLEY BOETTNER  MRN: [id number]  Account: [id number]  Session Start: 12/08/2020 10:05:00 AM  Session Stop: 12/08/2020 10:58:00 AM    Total Treatment Minutes: 53.00 Minutes    Occupational Therapy  Outpatient Treatment Note    Medical Diagnosis: Rank Code      Description    Date of Onset    1    S06.2X1   Diffuse traumatic brain injury with loss of      05/16/2019                 consciousness of 30 minutes or less  2    G91.9     Hydrocephalus, unspecified                       11/17/2020  Therapy Diagnosis:  Rank Code      Description     Date of Onset    1    R27.8     Other lack of coordination                       05/16/2019  2    R41.844   Frontal lobe and executive function deficit      05/16/2019  3    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  Patient reports a pain level of 3 out of 10. Repositioned patient.    OBJECTIVE  General Observation: Pt presents to OP OT session with face mask donned per  COVID 19 precautions. Pt mod I with SPC with ambulation to/from rehab lobby.    Interventions:       Therapeutic Exercise:       Therapeutic Activities:    Ther Ex: Focus of ther ex on use of max resistance (green) theraputty to improve  grip and pinch strength for improved use of bilateral UEs to complete  ADLs/IADLs. Pt completes the following with overall increased time due to  heightened distractions and need for instruction for novice task.  - Gross grasp: 1x10  - Putty extension rolls: 1x3  - Tip to tip pinches: 4x10  - Putty pulls:  1x8  Pt demonstrates positive response to theraputty ther ex with good demonstration  for teach back to demo understanding of task demands.    Ther Act: Focus of ther act on dynamic balance tasks to improve pt safety with  LB dressing and increase independence with meal prep tasks from standing level.  Pt completes the following with mod A for facilitating safety and facilitating  righting reaction and stabilization:  - Block step overs: 1x15 with bilateral UE support on environmental surfaces for  safety and stabilization  - Tandem Walk: Pt performs various trials with OT upgrading each trial to  increase task demands. Pt completes trial 1 with unilateral support on wall to  stabilize pt. Pt progresses to tandem walk with head turns for trial 2 with  increased assist required due to increased balance challenge. Pt completes trial  3-4 performing backward tandem walk with ongoing unilateral support on wall for  safety. Trial 4 performed with head turns as ongoing balance challenge. Pt  demonstrates increased fatigue/ burden of effort with task with pt rate of  perceived exhaustion at 3/10.  - Alternating Step Size: pt completes 3x 10 ft walk with alternating between  large and small step sizes to increase pt balance challenge with grading and  adjusting step size during confined household and community mobility tasks.  - Pt completes object pick up from floor to retrieve 10/10 small objects with pt  demonstrating good ability to adjust base of support into wide base of support  in order to improve pt stability with low reach. Pt requires SBA for object  retrieval from floor.    Pain Reassessment:  Response to Pain Intervention: Pt with no change in c/o pain in hip  Post Intervention Pain Quality:  Aching.  Patient Reports Post Intervention Pain Level of: 3 out of 10  Pain Acceptable: No: Ongoing education in repositioning and pain management  techniques.  Education:    Education Provided: Warden/ranger.       Audience: Patient.       Mode: Explanation.       Response: Demonstrated skill.  Needs reinforcement.    ASSESSMENT  Pt presents with challenges in dynamic standing balance impeding pt performance  with safety during LB dressing and showers. Pt presents with heightened fear of  falling and need for increased use of environmental support for stabilization.  Pt demonstrates good ability to teach back theraputty exercises as part of HEP  and would benefit from ongoing use of theraputty to improve grip strength and  fine motor coordination.    Activity/Participation Problem List and Goals: No updates at this time.  Functions/Structures Problem List and Goals: No updates at this time.    PLAN  Treatment Frequency, Duration, and Interventions: Continue Occupational Therapy  to achieve goals per previously established Plan of Care.  Development of Plan of Care: There was no change to plan of care today. Patient  and/or family continue to be in agreement with plan of care.  The patient has been instructed to contact our clinic if any questions or  problems should arise.    Visit Number: Today's visit is number  2    Signed by: Maryfrances Bunnell, OTR/L 12/08/2020 11:00:00 AM

## 2020-12-10 NOTE — Psych (Signed)
[name]  MRN: [id number]  Account: [id number]  Session Start: 12/10/2020 11:00:00 AM  Session Stop: 12/10/2020 11:53:00 AM    Total Treatment Minutes: 53.00 Minutes    Psychology Services  Outpatient Rehabilitation Progress Note    Rehab Diagnosis: TBI  Demographics:            Age: 33Y            Gender: Male    SUBJECTIVE  Patient Reports: Pt reported the following:  Feeling depressed  Thinking in all or nothing ways, which increase distress  Feeling ashamed of needing help    Suicide Risk Screen: Patient has these primary or secondary behavioral health  diagnoses or complaints: Describes self as mildly depressed. 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  Mood was depressed.  No evidence of  psychosis or delusions.  He explicitly denied SI and HI.  BDI score was by  history 46/64.  BDI on 11/26/2019 was 29.    Interventions:   NPSY INDIVID HLTH INTERV INT   NPSY INDIVID HLTH INTERV ADD    ASSESSMENT    Impressions:  Pt reported the following:  Feeling depressed  Thinking in all or nothing ways, which increase distress  Feeling ashamed of needing help    PLAN    Recommendations:  Weekly behavioral intervention    Signed by: Volanda Napoleon, Psy.D. 12/10/2020 11:53:00 AM

## 2020-12-15 NOTE — Rehab Progress Note ([facility]) (Signed)
Jonathan [name]  MRN: [id number]  Account: [id number]  Session Start: 12/15/2020 12:00:00 AM  Session Stop: 12/15/2020 12:00:00 AM    Total Treatment Minutes:  Minutes    Occupational Therapy  Outpatient Missed Visit Note    The patient did not attend the therapy appointment on 12/15/2020 at 13:00 pm.    Reason:  Family Emergency  Future Appointment: The patient has additional appointments.    Signed by: Maryfrances Bunnell, OTR/L 12/15/2020 2:00:00 PM

## 2020-12-22 ENCOUNTER — Ambulatory Visit
Admission: RE | Admit: 2020-12-22 | Discharge: 2020-12-22 | Disposition: A | Discharge status: Home / Self Care | Payer: Medicare Other | Source: Ambulatory Visit | Attending: Specialist | Admitting: Specialist

## 2020-12-22 DIAGNOSIS — I4891 Unspecified atrial fibrillation: Secondary | ICD-10-CM | POA: Insufficient documentation

## 2020-12-22 LAB — PT/INR
PT INR: 1.4 — ABNORMAL HIGH (ref 0.9–1.1)
PT: 16 s — ABNORMAL HIGH (ref 10.1–12.9)

## 2020-12-22 NOTE — Rehab Progress Note ([facility]) (Signed)
Jonathan [name]  MRN: [id number]  Account: [id number]  Session Start: 12/22/2020 1:02:00 PM  Session Stop: 12/22/2020 1:56:00 PM    Total Treatment Minutes: 54.00 Minutes    Occupational Therapy  Outpatient Treatment Note    Medical Diagnosis: Rank Code      Description    Date of Onset    1    S06.2X1   Diffuse traumatic brain injury with loss of      05/16/2019                 consciousness of 30 minutes or less  2    G91.9     Hydrocephalus, unspecified                       11/17/2020  Therapy Diagnosis:  Rank Code      Description     Date of Onset    1    R27.8     Other lack of coordination                       05/16/2019  2    R41.844   Frontal lobe and executive function deficit      05/16/2019  3    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    OBJECTIVE  General Observation: Pt presents to OP OT session with face mask donned per  COVID 19 precautions. Pt mod I with ambulation to/from rehab lobby and small  treatment space.    Interventions:       Neuromuscular Reeducation:       Therapeutic Activities:    NMRE: Focus of NMRE on dynamic and static standing balance, postural control and  righting reaction required to improve pt safety and balance during LB dressing  ADLs. Pt completes the following on foam balance pad to increase balance  challenge:    -Heel to toe rocks: 3x15, min A to facilitate standing balance and trunk control  for reduced compensatory trunk mobility for compensatory  stabilization  - Lateral Steps: 3x15, min A for facilitation of balance and correct pt position  in space. - - Trunk rotations with reach: min A to facilitate balance, and trunk  control throughout task. Pt completes 2x4 on ipsilateral side, 2x4 on  contralateral side for facilitation of cross body reach.  - Floor Reach: Pt performs pivot turns on balance pad to retrieve items from  floor with increased assist when return to upright position due to decreased  balance in transition to upright. Performs 1x4    Sit <> stands: Pt performs 1x10 with bilateral UE support on chair to improve  stability. Pt completes with BLEs positioned on foam balance pad. Pt completes  1x4 with no UE support  with increased assist for safety due to noted decreased  stability with transition to stand. Verbal cueing required to facilitate  anterior weight shift over base of support.  Ther Act: Pt transitions to standing level reach task with OT upgrading task to  perform from single leg stand to retrieve item from LE to improve pt carry over  with LB dressing tasks. Pt completes 1x5 with each LE.    Typing.Com-  Pt completes 3 minute typing test with results as follows: 29 WPM, 95% accuracy      Pain Reassessment: Pain was not reassessed as no pain was reported.  Education:    Education Provided: Fall prevention/balance training.       Audience: Patient.       Mode: Explanation.       Response: Verbalized understanding.  Needs reinforcement.    ASSESSMENT  Pt presents with good dynamic standing balance indicated by pt requiring min A  throughout balance tasks but no LOB, and good ability to stabilize self with  reduced physical assistance. Pt does demonstrate good tolerance for standing  level tasks with decreased need for rest breaks during current session compared  to previous session. Pt would benefit from ongoing skilled OT services to  maximize pt independence and safety in standing level ADLs (lower  body  dressing).    Activity/Participation Problem List and Goals: No updates at this time.  Functions/Structures Problem List and Goals: Other: LTG 5) (15 visits from  initial evaluation on 11/17/2020): Pt will demonstrate bilateral improvement in  box and block test by at least 5 blocks in order to demonstrate MDC required to  indicate improvement in gross motor coordination required for ADL/IADL  performance.  LTG 6) (15 visits from initial evaluation on 11/17/2020): Pt will improve left  hand 9 hole peg test score to at least 23 seconds in order to improve  performance to norm value to increase fine motor coordination required for ADLs.    LTG 7) (15 visits from initial evaluation on 11/17/2020): Pt will improve  functional reach score to at least 15 inches in order to decrease pt fall risk  and improve safety with community level IADLs.  Status:    PLAN  Treatment Frequency, Duration, and Interventions: Continue Occupational Therapy  to achieve goals per previously established Plan of Care.  Development of Plan of Care: There was no change to plan of care today. Patient  and/or family continue to be in agreement with plan of care.  The patient has been instructed to contact our clinic if any questions or  problems should arise.    Visit Number: Today's visit is number  3    Signed by: Maryfrances Bunnell, OTR/L 12/22/2020 2:00:00 PM

## 2020-12-22 NOTE — Psych (Signed)
NAMEMARKIS LANGLAND  MRN: [id number]  Account: [id number]  Session Start: 12/22/2020 11:00:00 AM  Session Stop: 12/22/2020 11:53:00 AM    Total Treatment Minutes: 53.00 Minutes    Psychology Services  Outpatient Rehabilitation Progress Note    Rehab Diagnosis: TBI  Demographics:            Age: 3Y            Gender: Male      SUBJECTIVE  Patient Reports: Pt reported the following:  Feeling depressed  Feeling self-doubt  Feeling anxious about trusting others    Suicide Risk Screen: Patient has these primary or secondary behavioral health  diagnoses or complaints: Describes self as mildly depressed. 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  Mood was depressed.  No evidence of  psychosis or delusions.  He explicitly denied SI and HI.  BDI score was by  history 46/64.  BDI on 11/26/2019 was 29.    Interventions:   NPSY INDIVID HLTH INTERV INT   NPSY INDIVID HLTH INTERV ADD    ASSESSMENT    Impressions:  Pt reported the following:  Feeling depressed  Feeling self-doubt  Feeling anxious about trusting others    PLAN    Recommendations:  Weekly behavioral intervention    Signed by: Volanda Napoleon, Psy.D. 12/22/2020 11:53:00 AM

## 2020-12-24 ENCOUNTER — Ambulatory Visit: Payer: Medicare Other

## 2020-12-24 NOTE — Psych (Signed)
Jonathan [name]  MRN: [id number]  Account: [id number]  Session Start: 12/24/2020 11:00:00 AM  Session Stop: 12/24/2020 11:53:00 AM    Total Treatment Minutes: 53.00 Minutes    Psychology Services  Outpatient Rehabilitation Progress Note    Rehab Diagnosis: TBI  Demographics:            Age: 74Y            Gender: Male      SUBJECTIVE  Patient Reports: Pt reported the following:  Feeling depressed  Feeling self-critical  Feeling withdrawn    Suicide Risk Screen: Patient has these primary or secondary behavioral health  diagnoses or complaints: Describes self as mildly depressed. 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  Mood was depressed.  No evidence of  psychosis or delusions.  He explicitly denied SI and HI.  BDI score was by  history 46/64.  BDI on 11/26/2019 was 29.    Interventions:   NPSY INDIVID HLTH INTERV INT   NPSY INDIVID HLTH INTERV ADD    ASSESSMENT    Impressions:  Pt reported the following:  Feeling depressed  Feeling self-critical  Feeling withdrawn    PLAN    Recommendations:  Weekly behavioral intervetion    Signed by: Volanda Napoleon, Psy.D. 12/24/2020 11:53:00 AM

## 2020-12-24 NOTE — Rehab Progress Note ([facility]) (Signed)
Jonathan [name]  MRN: [id number]  Account: [id number]  Session Start: 12/24/2020 10:04:00 AM  Session Stop: 12/24/2020 10:59:00 AM    Total Treatment Minutes: 55.00 Minutes    Occupational Therapy  Outpatient Treatment Note    Medical Diagnosis: Rank Code      Description    Date of Onset    1    S06.2X1   Diffuse traumatic brain injury with loss of      05/16/2019                 consciousness of 30 minutes or less  2    G91.9     Hydrocephalus, unspecified                       11/17/2020  Therapy Diagnosis:  Rank Code      Description     Date of Onset    1    R27.8     Other lack of coordination                       05/16/2019  2    R41.844   Frontal lobe and executive function deficit      05/16/2019  3    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  Patient/Caregiver Goals: 1) Be able to read and understand what pt is reading  2) Improve safety and speed during walking  3) Be able to get dressed easier  4) Be able to type faster  Pain: Patient currently without complaints of pain.    OBJECTIVE  General Observation: Pt presents to OP OT session with face mask donned per  COVID 19 precautions. Pt mod I with ambulation to/from rehab lobby and small  treatment space.    Interventions:       Therapeutic Activities:    Ther Act:  Low to high reach: Pt completes low reach with transition to overhead reach for  2x 8 while sustaining balance on foam balance pad in order to improve pt dynamic  standing balance, righting reactions, and pt ability to return to midline from  low reach for carry over into LB dressing tasks with imrpoved balance. Pt  demonstrates min unsteadiness throughout task with need for  intermittent  stabilizing assist from OT to facilitate balance. Pt completes task with no rest  breaks required.    Balance pad step overs: Pt performs step overs of 5" high object from balance  pad and narrow base of support in order to improve pt ability to correct  balance, return to safe, upright position, and increase pt independence with  balance during lower body ADL tasks. Pt completes 2x 15 with seated rest break  in between trials and intermittent steadying assist required throughout task.    Tennis [employer] Catch: Pt eprforms 2x20 bounc/ catch of tennis ball with narrow base  of support on foam balance pad in order to improve pt dynamic standing balance,  ability to return to upright position following reach outside  base of support.  Pt requires increased assist with stability throughout task.    Typing.Com. Pt completes typing tasks to improve typing speed, fine motor  coordination and IADL participation. Pt completes 3 trials of 3 minute typing  test with the following results:  35 WPM; 98% accuracy  31 WPM; 95% accuracy  31 WPM; 93% accuracy    Metronome Peg Placement: pt completes peg board placement task to metronome of  40-50 bpm for 3 trials with pt ranging in accuracy from 50-75% accurate  throughout task. Pt demonstrates improvement when asked to remove pegs to  metronome compared to placing pegs.    Pain Reassessment: Pain was not reassessed as no pain was reported.  Education:    Education Provided: Fall prevention/balance training.       Audience: Patient.       Mode: Explanation.       Response: Needs reinforcement.    ASSESSMENT  Pt presents with ongoing good ability to participate in balance training tasks  of increased complexity in order to challenge pt balance required for LB  dressing tasks. Pt demonstrated an 11 word improvement in typing speed at  current session when compared to initial evaluation, demonstrating improvement  in movement speed and coordination. Pt would benefit from  ongoing skilled OT  services to improve pt ability to perform dual taks challenges for improved  safety and independence with meal prep tasks.    Activity/Participation Problem List and Goals: No updates at this time.  Functions/Structures Problem List and Goals: No updates at this time.    PLAN  Treatment Frequency, Duration, and Interventions: Continue Occupational Therapy  to achieve goals per previously established Plan of Care.  Development of Plan of Care: There was no change to plan of care today. Patient  and/or family continue to be in agreement with plan of care.  The patient has been instructed to contact our clinic if any questions or  problems should arise.    Visit Number: Today's visit is number  4    Signed by: Maryfrances Bunnell, OTR/L 12/24/2020 11:00:00 AM

## 2020-12-29 NOTE — Psych (Signed)
Jonathan [name]  MRN: [id number]  Account: [id number]  Session Start: 12/29/2020 11:00:00 AM  Session Stop: 12/29/2020 11:53:00 AM    Total Treatment Minutes: 53.00 Minutes    Psychology Services  Outpatient Rehabilitation Progress Note    Rehab Diagnosis: TBI  Demographics:            Age: 30Y            Gender: Male    SUBJECTIVE  Patient Reports: Pt reported the following:  Feeling self-critical  Feeling guilty  Feeling anxious    Suicide Risk Screen: Patient has these primary or secondary behavioral health  diagnoses or complaints: Describes self as mildly depressed. Depression related  to medical situation Denied SI and hI    OBJECTIVE  General Observation: Arrived on time.  Mood was depressed.  No evidence of  psychosis or delusions.  He explicitly denied SI and HI.  BDI score was by  history 46/64.  BDI on 11/26/2019 was 29.    Interventions:   NPSY INDIVID HLTH INTERV INT   NPSY INDIVID HLTH INTERV ADD    ASSESSMENT    Impressions:  Pt reported the following:  Feeling self-critical  Feeling guilty  Feeling anxious    PLAN    Recommendations:  Weekly behavioral intervention    Signed by: Volanda Napoleon, Psy.D. 12/29/2020 11:53:00 AM

## 2020-12-29 NOTE — Rehab Progress Note ([facility]) (Signed)
Jonathan [name]  MRN: [id number]  Account: [id number]  Session Start: 12/29/2020 12:03:00 PM  Session Stop: 12/29/2020 1:00:00 PM    Total Treatment Minutes: 57.00 Minutes    Occupational Therapy  Outpatient Treatment Note    Medical Diagnosis: Rank Code      Description    Date of Onset    1    S06.2X1   Diffuse traumatic brain injury with loss of      05/16/2019                 consciousness of 30 minutes or less  2    G91.9     Hydrocephalus, unspecified                       11/17/2020  Therapy Diagnosis:  Rank Code      Description     Date of Onset    1    R27.8     Other lack of coordination                       05/16/2019  2    R41.844   Frontal lobe and executive function deficit      05/16/2019  3    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    OBJECTIVE  General Observation: Pt presents to OP OT session with face mask donned per  COVID 19 precautions. Pt mod I with ambulation to/from rehab lobby.    Interventions:       Therapeutic Activities:    BITS: Pt engaged in use of BITS in order to challenge underlying skills  necessary to complete ADL/IADL tasks and for community re-entry. Underlying  skills challenged during today's session include static/dynamic standing  balance, sustained/divided attention, hand-eye coordination, and BUE motor  control.    Visual Scanning ? Single Target/User Paced Results ? Central Fixation ?  Standing:  Trial 1: 95% accuracy, 1.47 sec VMRS, 40 hits, Fixation Results: 100%  accuracy,  4 attended, 0 unattended  Trial 2:  95% accuracy, 1.38 sec VMRS, 43 hits, Fixation Results: 66% accuracy,  2 attended, 1 unattended  Trial 3: 100% accuracy, 1.27 sec VMRS, 46 hits, Fixation Results: 100% accuracy,  6 attended, 0 unattended  Trial 4: 100% accuracy, 1.24 sec VMRS, 48 hits, Fixation Results: 66% accuracy,  4 attended, 2 unattended  Without Central Flashing  Trial 5: 100% accuracy, 1.36 sec VMRS, 44 hits, Fixation Results: 71% accuracy,  4 attended, 2 unattended  Trial 6: 96% accuracy, 1.19 sec VMRS, 49 hits, Fixation Results: 87% accuracy, 7  attended, 1 unattended    Visual Pursuit ? Rotator/Sequence Results, 15 hits - Standing:  Trial 1: 100% accuracy, 1.76 sec VMRS  Trial 2: 100% accuracy, 1.22 sec VMRS  Trial 3:  100% accuracy, 1.22 sec VMRS  Trial 4: 100 accuracy, 1.20 sec VMRS    Numbers/Letters ? 15 hits  93% accuracy, 1.77 sec VMRS  93% accuracy, 1.77 sec VMRS  100% accuracy, 1.31 sec VMRS    Central Fixation with Numbers only  100% accuracy, 1.64 sec VMRS, Fixation Results: 100% accuracy, 1 unattended, 0  unattended  92% accuracy, 1.95 sec VMRS, Fixation Results: 50% accuracy, 1 attended, 1  unattended  96% accuracy, 2.85 sec VMRS, Fixation Results: 66% accuracy, 2 attended, 1  unattended  96% accuracy, 2.16 sec VMRS, Fixation Results: 100% accuracy, 3 attended, 0  unattended  100% accuracy, 1.76 sec VMRS, Fixation Results: 33% accuracy, 1 attended, 2  unattended    Min verbal cues provided throughout for recall of instructions and integration  of seated rest breaks.    OT discusses the following relaxation techniques/strategies: meditation, audio  books, yoga, HEP in order to decrease pt's self-reported anxiety and increase  participation in meaningful leisure activities. Pt identifying listening of  radio as a form of relaxation.    Pain Reassessment: Pain was not reassessed as no pain was reported.  Education:    Education Provided: Brewing technologist .       Audience:  Patient.       Mode: Explanation.  Demonstration.       Response: Verbalized understanding.  Needs practice.  Needs reinforcement.    ASSESSMENT  Patient demonstrated excellent performance throughout use of BITS as noted by  good static/dynamic standing balance, divided attention and hand-eye  coordination, being able to tolerate increased level of task difficulty. Patient  requiring min verbal cues for integration of seated rest breaks due to reported  fatigue. Patient rating self-performance as "poor" throughout completion of  therapeutic intervention despite progress made, requiring max verbal  encouragement. OT to continue to address deficits in order to maximize safety  and independence with daily ADL routine and return to participation in  meaningful IADL tasks.  Activity/Participation Problem List and Goals: No updates at this time.  Functions/Structures Problem List and Goals: No updates at this time.    PLAN  Treatment Frequency, Duration, and Interventions: Continue Occupational Therapy  to achieve goals per previously established Plan of Care.  Development of Plan of Care: There was no change to plan of care today. Patient  and/or family continue to be in agreement with plan of care.  The patient has been instructed to contact our clinic if any questions or  problems should arise.    Visit Number: Today's visit is number  5    Signed by: Pasty Manninen, OTR/L 12/29/2020 1:00:00 PM

## 2020-12-31 ENCOUNTER — Ambulatory Visit
Admission: RE | Admit: 2020-12-31 | Discharge: 2020-12-31 | Disposition: A | Discharge status: Home / Self Care | Payer: Medicare Other | Source: Ambulatory Visit | Attending: Specialist | Admitting: Specialist

## 2020-12-31 ENCOUNTER — Inpatient Hospital Stay
Admission: RE | Admit: 2020-12-31 | Discharge: 2020-12-31 | Disposition: A | Discharge status: Home / Self Care | Payer: Medicare Other | Source: Ambulatory Visit | Attending: Specialist | Admitting: Specialist

## 2020-12-31 ENCOUNTER — Ambulatory Visit
Admission: RE | Admit: 2020-12-31 | Discharge: 2020-12-31 | Disposition: A | Discharge status: Home / Self Care | Payer: Medicare Other | Source: Ambulatory Visit

## 2020-12-31 DIAGNOSIS — F4321 Adjustment disorder with depressed mood: Secondary | ICD-10-CM | POA: Insufficient documentation

## 2020-12-31 DIAGNOSIS — R278 Other lack of coordination: Secondary | ICD-10-CM | POA: Insufficient documentation

## 2020-12-31 DIAGNOSIS — I4891 Unspecified atrial fibrillation: Secondary | ICD-10-CM | POA: Insufficient documentation

## 2020-12-31 DIAGNOSIS — R41844 Frontal lobe and executive function deficit: Secondary | ICD-10-CM

## 2020-12-31 DIAGNOSIS — H5330 Unspecified disorder of binocular vision: Secondary | ICD-10-CM

## 2020-12-31 LAB — PT/INR
PT INR: 1.5 — ABNORMAL HIGH (ref 0.9–1.1)
PT: 17.9 s — ABNORMAL HIGH (ref 10.1–12.9)

## 2020-12-31 NOTE — Progress Notes (Signed)
[facility]  Outpatient Neuro Rehabilitation Department  [address] [place] [address]  Phone: [phone]    Fax: [phone]                 NEUROPSYCHOLOGICAL PROGRESS NOTE      PATIENT: Jonathan [name] DOB: 13-Nov-1950   MRN: [id number]  AGE: 70 y.o.    Primary Language:English    Interpreter:          Referring Provider:   Dr. Sabra Heck    Concurrent Services: NP    Medical Diagnosis: S06.9X9A TBI    Therapy Diagnosis:   1. Adjustment disorder with depressed mood           Date of Service: 12/31/2020       General Observations:      He appeared alert and oriented to person, place, and time.  He maintained eye contact and presented as cooperative and engaged throughout the course of the meeting.  Affect was appropriate for the situation.  Thought processes were logical and goal directed.  Expressive speech was fluent.  Conversational speech was intact.  There was no evidence of psychosis or delusions.  He explicitly denied SI and HI.    Current Symptoms: Pt reported the following:  Feeling self-critical, holding negative view of self and others, feeling guilty about needing help.  I provided cognitive reframing and supportive statements aimed at enhancing positive view of self and others.    Treatment Plan/Recommendations: Weekly behavioral intervention

## 2020-12-31 NOTE — OT Progress Note (Signed)
[facility]  Outpatient Neuro Rehabilitation Department  [address] [place] [address]  Phone: [phone]    Fax: [phone]     Occupational Therapy - Treatment Note      PATIENT: Jonathan [name] DOB: 1951/06/15   MRN: [id number]  AGE: 70 y.o.    Primary Language: English   Interpreter:  Interpreter: N/A - English is preferred language     Referring Provider:  Roland Rack, MD Shelbie Hutching Riecker PA-C   CERTIFICATION DATES: 11/17/2020- 02/15/2021     Medical Diagnosis: S06.2X1   Diffuse traumatic brain injury with loss of      05/16/2019                 consciousness of 30 minutes or less  2    G91.9     Hydrocephalus, unspecified                       11/17/2020   Therapy Diagnosis:   1. Abnormal coordination     2. Frontal lobe and executive function deficit     3. Binocular vision disorder          Date of Service: 12/31/20   Start Time: 1200   Stop Time: 1254   Time Calculation (min): 54 min   Visit #: 6     Rehabilitation Precautions/Restrictions:   Pacemaker Precautions: other (comment) (Prescence of pacemaker)  Fall Risks: Impaired balance/gait     SUBJECTIVE     Patient/Caregiver Goals: 1) Be able to read and understand what pt is reading  2) Improve safety and speed during walking  3) Be able to get dressed easier  4) Be able to type faster     Patient Report: Pt reports he feels as though he is getting worse.     OBJECTIVE     General Observation: Pt presents to OP OT session with face mask donned per COVID 19 precautions. Pt modified independent with ambulation to/from rehab lobby.      Vitals: N/A    Pain Assessment/Re-Assessment: Pt denies pain pre/post session     Interventions: Therapeutic Activity    Short Term Goal 1 STG 1) (10 visits from initial evaluation on 11/17/2020) Pt will perform  simulated LB dressing with no loss of balance or increased time in order to  improve efficiency and safety with daily clothing change.   Status: Addressed   Intervention:  Speed Ball  Bounce: Pt performs ball bounce against wall while sustaining balance on foam balance pad to address reactive balance and dynamic standing balance. Pt completes 3 reps with short rest breaks in between trials due to fatigue. Pt completes the following trials/ results:   Trial 1) 83 bounces in 60 seconds   Trial 2) 92 bounces in 60 seconds   Trial 3) 116 bounces in 60 seconds   Pt requires SBA for safety with balance.     Bean Bag Toss: Pt performs bean bag toss on balance pad to challenge dynamic standing balance and low reach for clothing obtainment for LB dressing. Pt requires SBA for safety with balance when reaching to floor. Pt completes 6 trials with the following accuracy/score:   Trial 1) 7/10  Trial 2) 8/10  Trial 3) 8/10  Trial 4) 6/10  Trial 5) 9/10  Trial 6) 8/10     Short Term Goal 2 STG 2) (10 visits from initial evaluation on 11/17/2020): Pt will read short  newspaper  article and recall 50% of detail in order to improve comprehension and  visual tolerance for small print reading. Status: Not addressed      Intervention:         Short Term Goal 3 STG 3) (10 visits from initial evaluation on 11/17/2020): Pt will improve typing  speed based on one minute typing test to at least 26 words per minute in order  to demonstrate improvement in bilateral coordination required for computer based  IADLs.   Status: Addressed   Intervention:  Speed Sorting: Pt performs speed card sorting in sitting to focus on hand speed, fine motor coordination and concentration during speed tasks to improve typing speed. Pt completes 3 trials with trial 3 upgraded to have pt name card/suite as pt sorts. Pt completes the following:    Trial 1) 1'18"  Trial 2) 1'10"   Trial 3) 1'13"      Increased time required at start of session to educate pt on scores/ performance on BITS from previous session to improve pt self efficacy with task, and increase pt understanding/ awareness of score and interpretation.             Education  Documentation  Emotional adjustment/depression, taught by Maryfrances Bunnell, OT at 12/31/2020  1:06 PM.  Learner: Patient  Readiness: Eager  Method: Demonstration  Response: Demonstrated Understanding, Needs Reinforcement    Education Comments  No comments found.         ASSESSMENT     Mr. Cisse presents with good tolerance throughout OT session and positive response to timed challenges to increase speed and coordination. Pt demonstrated good ability to improve performance on card sort, and ball bounce over multiple trials, and is demonstrating good balance with use of foam balance pad to challenge dynamic standing balance. Patient will benefit from continued skilled outpatient Occupational Therapy services to address decreased coordination and dynamic standing balance, in order to improve pt independence with standing level ADLs, and meal prep tasks that require increased divided attention and balance challenges.       PLAN/RECOMMENDATIONS      Treatment Frequency/Duration: Continue Outpatient Occupational Therapy to achieve goals per previously established plan of care.     Equipment Recommended: No additional equipment/DME recommended at this time    Recommended Consults: None    Development of Plan of Care: No change to plan of care today. Patient and/or family continue to be in agreement with plan of care.     The patient/family has been instructed to contact the clinic if any questions or problems should arise.    Signature Katie Sharonlee Nine MSOT OTR/L LSVT-BIG  [email or web address]

## 2021-01-05 ENCOUNTER — Ambulatory Visit: Payer: Medicare Other

## 2021-01-07 ENCOUNTER — Ambulatory Visit: Payer: Medicare Other

## 2021-01-12 ENCOUNTER — Ambulatory Visit
Admission: RE | Admit: 2021-01-12 | Discharge: 2021-01-12 | Disposition: A | Discharge status: Home / Self Care | Payer: Medicare Other | Source: Ambulatory Visit | Attending: Specialist | Admitting: Specialist

## 2021-01-12 ENCOUNTER — Ambulatory Visit
Admission: RE | Admit: 2021-01-12 | Discharge: 2021-01-12 | Disposition: A | Discharge status: Home / Self Care | Payer: Medicare Other | Source: Ambulatory Visit

## 2021-01-12 DIAGNOSIS — F4321 Adjustment disorder with depressed mood: Secondary | ICD-10-CM

## 2021-01-12 DIAGNOSIS — F419 Anxiety disorder, unspecified: Secondary | ICD-10-CM

## 2021-01-12 DIAGNOSIS — R278 Other lack of coordination: Secondary | ICD-10-CM

## 2021-01-12 DIAGNOSIS — H5330 Unspecified disorder of binocular vision: Secondary | ICD-10-CM

## 2021-01-12 DIAGNOSIS — I4891 Unspecified atrial fibrillation: Secondary | ICD-10-CM | POA: Insufficient documentation

## 2021-01-12 DIAGNOSIS — R41844 Frontal lobe and executive function deficit: Secondary | ICD-10-CM

## 2021-01-12 LAB — PT/INR
PT INR: 1.9 — ABNORMAL HIGH (ref 0.9–1.1)
PT: 21.9 s — ABNORMAL HIGH (ref 10.1–12.9)

## 2021-01-12 NOTE — OT Progress Note (Signed)
[facility]  Outpatient Neuro Rehabilitation Department  [address], [place] [address]  Phone: [phone]    Fax: [phone]     Occupational Therapy - Treatment Note      PATIENT: Jonathan [name] DOB: Mar 17, 1951   MRN: [id number]  AGE: 70 y.o.    Primary Language: English  Interpreter: N/A - English is preferred language     Referring Provider:       CERTIFICATION DATES: 11/17/2020- 02/15/2021     Medical Diagnosis: S06.2X1 Diffuse traumatic brain injury with loss of 05/16/2019  consciousness of 30 minutes or less  2 G91.9 Hydrocephalus, unspecified 11/17/2020   Therapy Diagnosis:   1. Frontal lobe and executive function deficit     2. Binocular vision disorder     3. Adjustment disorder with depressed mood     4. Abnormal coordination          Date of Service: 01/12/21   Start Time: 1007   Stop Time: 1100   Time Calculation (min): 53 min   Visit #: 7     Rehabilitation Precautions/Restrictions:   Fall Risks: Impaired balance/gait    SUBJECTIVE     Patient/Caregiver Goals: 1) Be able to read and understand what pt is reading  2) Improve safety and speed during walking  3) Be able to get dressed easier  4) Be able to type faster    Patient Report: Pt reports he performs similar to previous session with use of BITS during present session.     OBJECTIVE     General Observation: Pt presents to OP OT session with face mask donned per COVID 19 precautions. Pt modified independent with ambulation with single point cane.        Pain Assessment/Re-Assessment: Pt denies pain pre/post session     Interventions: Therapeutic Activity      Short Term Goal 1 STG 1) (10 visits from initial evaluation on 11/17/2020) Pt will perform  simulated LB dressing with no loss of balance or increased time in order to  improve efficiency and safety with daily clothing change.   Status: Not addressed    Intervention:       Short Term Goal 2 (10 visits from initial  evaluation on 11/17/2020): Pt will read short  newspaper article and recall 50% of detail in order to improve comprehension and  visual tolerance for small print reading. Status: Addressed     Intervention:  BITS: Pt utilizes [employer] to address and focus on response time, dynamic standing balance and functional reach. Pt performs as follows:   Building surveyor (Single Target- Time Paced: 2:00 minutes [time per trial])   Trial 1) Accuracy: 100%, Reaction Time: 1.25 seconds, Hits: 94                Central Fixation: Accuracy: 82.35%, Changes: 17, Attended: 14, Unattended 3                Additional details: Standing  Visual Scanning (Single Target- Time Paced: 2:00 minutes [time per trial])   Trial 2 ) Accuracy: 98.89%, Reaction Time: 1.33 seconds, Hits: 89                Central Fixation: Accuracy: 93.33%, Changes: 15, Attended: 14, Unattended 1                Additional details: Standing  Visual Scanning (Single Target- Time Paced: 2:00 minutes [time per trial])   Trial 3) Accuracy: 98.77%, Reaction Time: 1.48  seconds, Hits: 80                Central Fixation: Accuracy: 86.96%, Changes: 23, Attended: 20, Unattended 3                Additional details: Standing    Visual Scanning (Complex Array- Sequence)   Trial 1) Accuracy: 100%,  Time to complete: 0:45 minutes, Reaction Time: 1.74 seconds, Hits: 26                Central Fixation: Accuracy: 75, Changes: 4, Attended: 3, Unattended 1                Additional details: Standing    Visual Scanning (Complex Array- Sequence)   Trial 2) Accuracy: 100%,  Time to complete: 0:59 minutes, Reaction Time: 2.27 seconds, Hits: 26                Central Fixation: Accuracy: 87.5%, Changes: 8, Attended: 7, Unattended 1                Additional details: Sitting  Visual Scanning (Complex Array- Sequence)   Trial 3) Accuracy: 89.66%,  Time to complete: 1:35 minutes, Reaction Time: 3.65 seconds, Hits: 26                Central Fixation: Accuracy: 88.89%, Changes: 9, Attended: 8,  Unattended 1                Additional details: Persistence on; sitting   Visual Scanning (Complex Array- Sequence)   Trial 4) Accuracy: 96.3%,  Time to complete: 1:20 minutes, Reaction Time: 3.08 seconds, Hits: 26                Central Fixation: Accuracy: 100%, Changes: 6, Attended: 6, Unattended 0                Additional details: Persistence on; standing     Short Term Goal 3 (10 visits from initial evaluation on 11/17/2020): Pt will improve typing  speed based on one minute typing test to at least 26 words per minute in order  to demonstrate improvement in bilateral coordination required for computer based  IADLs.   Status: Addressed   Intervention:  Task: Resistance Turns  Details: Pt performs 1x10 alternating hand resistance container lid turns with green theraputty to improve pinch and grasp required for hand coordination and strength   Assist level: Minimal Assist due to/for  Body mechanics and joint position of hand  Skilled techniques used include facilitation of hand position to improve joint protection with turn . Skilled intervention address(es) the following performance skills necessary for intended occupational performance: Motor Skills: Manipulates and Coordinates Body functions addressed that influence performance in identified occupation/intervention include: Neuromusculoskeletal and movement related functions and Muscle functions    Task: Clothespin Pinches  Details: Pt performs 1x10 three jaw chuck pinches with resistance clothespins to improve pinch strength and hand coordination of alternating hands. Pt performs with highest resistance of 6-8#.   Assist level: Supervision/ CGA due to/for  Consistency with pinch coordination  Skilled techniques used include assessment and adjustment of resistance for grading of task demands . Skilled intervention address(es) the following performance skills necessary for intended occupational performance: Motor Skills: Grips and Coordinates  Body functions  addressed that influence performance in identified occupation/intervention include: Muscle functions and Movement functions         Long Term Goal 1 15 visits from initial evaluation on 11/17/2020): Pt will perform morning  ADL routine  with no increase in time per pt report and with grossly modified  independence in order to decrease burden of effort for morning ADLs.   Status: Not addressed    Intervention:     Long Term Goal 2 (15 visits from initial evaluation on 11/17/2020): Pt will improve  functional reach score to at least 15 inches in order to decrease pt fall risk  and improve safety with community level IADLs. Status: Not addressed      Intervention:     Long Term Goal 3 (15 visits from initial evaluation on 11/17/2020): Pt will perform hot  meal prep task requiring at least 50% divided attention with supervision, in  order to decrease CG burden and improve pt independence in meal prep tasks. Status: Not addressed    Intervention:     Long Term Goal 4 (15 visits from initial evaluation on 11/17/2020): Pt will  demonstrate bilateral improvement in box and block test by at least 5 blocks in  order to demonstrate MDC required to indicate improvement in gross motor  coordination required for ADL/IADL performance.   Status: Not addressed    Intervention:      Long Term Goal 5 (15 visits from initial evaluation on 11/17/2020): Pt will improve left  hand 9 hole peg test score to at least 23 seconds in order to improve  performance to norm value to increase fine motor coordination required for ADLs. Status: Not addressed    Intervention:        Education: Education  Audience: Patient  Topics:  Progress, BITS performance     Mode of Education Provided:  Explanation    Learner Response:  Verbalized understanding  Further Teaching Required:  Needs reinforcement      ASSESSMENT     Pt demonstrates good response to use of BITS technology to attempt to increase initiation, response time, dynamic standing balance and  functional reach. Pt did express decreased self perception of performance with BITS and would benefit from pt ability to recognize and see visual change in performance across multiple sessions to reinforce pt progress. Patient will benefit from continued skilled outpatient Occupational Therapy services to address dynamic standing balance, hand coordination, and visual tolerance, in order to improve pt ability to complete dynamic standing level ADLs, and increase participation in typing and reading tasks.       PLAN/RECOMMENDATIONS      Treatment Frequency/Duration: Continue Outpatient Occupational Therapy to achieve goals per previously established plan of care.     Equipment Recommended: No additional equipment/DME recommended at this time    Recommended Consults: None    Development of Plan of Care: No change to plan of care today. Patient and/or family continue to be in agreement with plan of care.     The patient/family has been instructed to contact the clinic if any questions or problems should arise.    Signature Katie Haitham Dolinsky MSOT OTR/L LSVT-BIG  [email or web address]

## 2021-01-12 NOTE — Progress Notes (Signed)
[facility]  Outpatient Neuro Rehabilitation Department  [address], [place] [address]  Phone: [phone]    Fax: [phone]                 NEUROPSYCHOLOGICAL PROGRESS NOTE      PATIENT: Jonathan [name] DOB: [birth year]   MRN: [id number]  AGE: 70 y.o.    Primary Language:English    Interpreter:          Referring Provider:       Concurrent Services: NP    Medical Diagnosis: No primary diagnosis found.    Therapy Diagnosis: No diagnosis found.      Date of Service: 01/12/2021       General Observations:      He appeared alert and oriented to person, place, and time.  He maintained eye contact and presented as cooperative and engaged throughout the course of the meeting.  Affect was appropriate for the situation.  Thought processes were logical and goal directed.  Expressive speech was fluent.  Conversational speech was intact.  There was no evidence of psychosis or delusions.  He explicitly denied SI and HI.    Current Symptoms: Pt reported the following:  Feeling dependent on persistent worry to feel secure.  This pattern undermines emotional stability.  I provided cognitive reframing aimed at establishing positive mental coping.    Treatment Plan/Recommendations: Weekly behavioral intervention

## 2021-01-14 ENCOUNTER — Ambulatory Visit
Admission: RE | Admit: 2021-01-14 | Discharge: 2021-01-14 | Disposition: A | Discharge status: Home / Self Care | Payer: Medicare Other | Source: Ambulatory Visit

## 2021-01-14 DIAGNOSIS — F419 Anxiety disorder, unspecified: Secondary | ICD-10-CM

## 2021-01-14 DIAGNOSIS — R41844 Frontal lobe and executive function deficit: Secondary | ICD-10-CM

## 2021-01-14 DIAGNOSIS — H5330 Unspecified disorder of binocular vision: Secondary | ICD-10-CM

## 2021-01-14 DIAGNOSIS — R278 Other lack of coordination: Secondary | ICD-10-CM

## 2021-01-14 NOTE — Progress Notes (Signed)
[facility]  Outpatient Neuro Rehabilitation Department  [address] [place] [address]  Phone: [phone]    Fax: [phone]     Occupational Therapy - Treatment Note      PATIENT: Jonathan [name] DOB: Sep 02, 1950   MRN: [id number]  AGE: 70 y.o.    Primary Language: English  Interpreter: N/A - English is preferred language     Referring Provider:  Shary Key, PA    CERTIFICATION DATES: 11/17/2020- 02/15/2021     Medical Diagnosis: S06.2X1   Diffuse traumatic brain injury with loss of      05/16/2019                 consciousness of 30 minutes or less  2    G91.9     Hydrocephalus, unspecified                       11/17/2020   Therapy Diagnosis:   1. Frontal lobe and executive function deficit        2. Abnormal coordination        3. Binocular vision disorder             Date of Service: 01/14/21   Start Time: 1007   Stop Time: 1100   Time Calculation (min): 53 min   Visit #: 8     Rehabilitation Precautions/Restrictions:   Fall Risks: Impaired balance/gait, Impaired mobility    SUBJECTIVE     Patient/Caregiver Goals: 1) Be able to read and understand what pt is reading  2) Improve safety and speed during walking  3) Be able to get dressed easier  4) Be able to type faster    Patient Report: Pt reports he felt unsteady and "wobbly" during AM shower     OBJECTIVE     General Observation: Pt presents to OP OT session with face mask donned per COVID 19 precautions. Pt modified independent with ambulation with single point cane         Pain Assessment/Re-Assessment: Pt denies pain pre/post session aside from "normal old man pain"     Interventions: Neuromuscular Re-Education and Therapeutic Activity      Short Term Goal 1 STG 1) (10 visits from initial evaluation on 11/17/2020) Pt will perform  simulated LB dressing with no loss of balance or increased time in order to  improve efficiency and safety with daily clothing change.   Status: Addressed   Intervention:    Task: Dead Lifts on  Balance Pad   Type: Therapeutic Activity  Details: Pt performs 3x15 dead lift on foam balance pad to increase dynamic standing balance and return to upright position following low reach requires for dressing. Pt completes with low resistance theraband for all sets/reps.   Assist level: Minimal Assist due to/for  balance and unsteadiness noted throughout  Skilled techniques used include Tactile facilitation of balance to correct into midline throughout task, verbal cueing for body mechanics . Skilled intervention address(es) the following performance skills necessary for intended occupational performance: Motor Skills: Biomedical scientist functions addressed that influence performance in identified occupation/intervention include: Neuromusculoskeletal and movement related functions, Muscle functions, and Movement functions    Task: Unsupported Standing- Righting Reactions   Type: Neuro Re-Ed  Details: Pt completes unsupported standing on air disc with intermittent use of table top to stabilize throughout task. Pt completes 5 reps of 60 second durations.   Assist level: Minimal Assist due to/for  decreased  balance and posture in standing  Skilled techniques used include tactile facilitation to return to midline following loss of balance, verbal cueing for weight shifting to facilitate equal distribution of weight . Skilled intervention address(es) the following performance skills necessary for intended occupational performance: Motor Skills: Aligns and [employer] functions addressed that influence performance in identified occupation/intervention include: Sensory functions and Movement functions    Task: Scanning on uneven surface   Type: Neuro Re-Ed  Details: Pt completes 2x 6 multidirectional reach and scanning to locate cones placed through rehab gym environment. Trial 2 upgraded to have pt perform over the shoulder scanning to increase cervical rotation from uneven surface  Assist level: Minimal  Assist and Moderate Assist due to/for  minimal assist required for trial 1, mod assist required for trial 2 due to upgrade in task demands, requires assist due to decreased balance and balance correction  Skilled techniques used include Physical assist required to correct loss of balance on air disc with verbal cueing to motor plan correcting balance into midline position . Skilled intervention address(es) the following performance skills necessary for intended occupational performance: Motor Skills: Agricultural engineer: [email or web address] . Body functions addressed that influence performance in identified occupation/intervention include: Sensory functions and Movement functions    Task: Air Disc   Type: Neuro Re-Ed  Details: Pt completes standing level balance tasks on air disc to perform anterior and posterior weight shifting 3x15 with trial 2-3 upgraded to perform in semi tandem stand on air disc to increase pt abiltiy to sustain balance on unsteady surface and right self into midline  Assist level: Moderate Assist due to/for  decreased balance and steadiness on uneven surface  Skilled techniques used include tactile facilitation of balance and correcting self into midline. Skilled intervention address(es) the following performance skills necessary for intended occupational performance: Motor Skills: Aligns, Stabilizes, and [employer] functions addressed that influence performance in identified occupation/intervention include: Sensory functions and Movement functions       Short Term Goal 2 (10 visits from initial evaluation on 11/17/2020): Pt will read short  newspaper article and recall 50% of detail in order to improve comprehension and  visual tolerance for small print reading. Status: Not addressed      Intervention:     Short Term Goal 3 (10 visits from initial evaluation on 11/17/2020): Pt will improve typing  speed based on one minute typing test to at least 26 words per minute in order  to  demonstrate improvement in bilateral coordination required for computer based  IADLs.   Status: Addressed   Intervention:  Task: Coordinated Digit Tapping  Type: Therapeutic Activity  Details: Pt performs coordinated digit tapping to individual blocks for sequence of 1,2, 3, and 4 steps to challenge digit reach, speed, and coordination required for typing. Pt completes with 90% accuracy with 1x need for correction   Assist level: Supervision/ CGA due to/for  sequencing verbal instruct 1x  Skilled techniques used include verbal cueing and modified pacing to increase pt planning of motor movements before initiating sequence . Skilled intervention address(es) the following performance skills necessary for intended occupational performance: Motor Skills: Coordinates Process Skills: Sequences Body functions addressed that influence performance in identified occupation/intervention include: Mental functions (affective, cognitive, perceptual) and Movement functions           Long Term Goal 1 15 visits from initial evaluation on 11/17/2020): Pt will perform morning  ADL routine with no increase in time per pt report and with grossly  modified  independence in order to decrease burden of effort for morning ADLs.   Status: Not addressed    Intervention:     Long Term Goal 2 (15 visits from initial evaluation on 11/17/2020): Pt will improve  functional reach score to at least 15 inches in order to decrease pt fall risk  and improve safety with community level IADLs. Status: Not addressed      Intervention:     Long Term Goal 3 (15 visits from initial evaluation on 11/17/2020): Pt will perform hot  meal prep task requiring at least 50% divided attention with supervision, in  order to decrease CG burden and improve pt independence in meal prep tasks.   Status: Not addressed    Intervention:     Long Term Goal 4 (15 visits from initial evaluation on 11/17/2020): Pt will  demonstrate bilateral improvement in box and block test by at  least 5 blocks in  order to demonstrate MDC required to indicate improvement in gross motor  coordination required for ADL/IADL performance.   Status: Not addressed    Intervention:      Long Term Goal 5 (15 visits from initial evaluation on 11/17/2020): Pt will improve left  hand 9 hole peg test score to at least 23 seconds in order to improve  performance to norm value to increase fine motor coordination required for ADLs. Status: Not addressed    Intervention:        Education: Education  Audience: Patient  Topics:  Progress, recovery prognosis/ timeline     Mode of Education Provided:  Explanation    Learner Response:  Verbalized understanding  Further Teaching Required:  Needs reinforcement      ASSESSMENT     Pt presents to OT session with good response to upgraded balance challenges presented to patient on balance disc. Pt receptive to trying multiple activities with use of balance pad and demonstrates overall good ability to sustain balance with intermittent use of table for support and correction from OT. Patient will benefit from continued skilled outpatient Occupational Therapy services to address dynamic standing balance, in order to improve pt safety and confidence with standing level ADLs and IADL.       PLAN/RECOMMENDATIONS      Treatment Frequency/Duration: Continue Outpatient Occupational Therapy to achieve goals per previously established plan of care.     Equipment Recommended: No additional equipment/DME recommended at this time    Recommended Consults: None    Development of Plan of Care: No change to plan of care today. Patient and/or family continue to be in agreement with plan of care.     The patient/family has been instructed to contact the clinic if any questions or problems should arise.    Signature Katie Chip Canepa MSOT OTR/L LSVT-BIG  [email or web address]

## 2021-01-14 NOTE — Progress Notes (Signed)
[facility]  Outpatient Neuro Rehabilitation Department  [address], [place] [address]  Phone: [phone]    Fax: [phone]                 NEUROPSYCHOLOGICAL PROGRESS NOTE      PATIENT: Jonathan [name] DOB: Apr 06, 1951   MRN: [id number]  AGE: 70 y.o.    Primary Language:English    Interpreter:          Referring Provider:       Concurrent Services:  NP    Medical Diagnosis: No primary diagnosis found.    Therapy Diagnosis: No diagnosis found.      Date of Service: 01/14/2021       General Observations:     He appeared alert and oriented to person, place, and time.  He maintained eye contact and presented as cooperative and engaged throughout the course of the meeting.  Affect was appropriate for the situation.  Thought processes were logical and goal directed.  Expressive speech was fluent.  Conversational speech was intact.  There was no evidence of psychosis or delusions.  He explicitly denied SI and HI.    Current Symptoms: Pt reported the following:  Feeling depressed, self-critical, and shame.  I provided cognitive reframing aimed at establishing a positive view of self.    Treatment Plan/Recommendations: Weekly behavioral intervention

## 2021-01-19 ENCOUNTER — Ambulatory Visit
Admission: RE | Admit: 2021-01-19 | Discharge: 2021-01-19 | Disposition: A | Discharge status: Home / Self Care | Payer: Medicare Other | Source: Ambulatory Visit

## 2021-01-19 DIAGNOSIS — R41844 Frontal lobe and executive function deficit: Secondary | ICD-10-CM

## 2021-01-19 DIAGNOSIS — R278 Other lack of coordination: Secondary | ICD-10-CM

## 2021-01-19 DIAGNOSIS — H5330 Unspecified disorder of binocular vision: Secondary | ICD-10-CM

## 2021-01-19 DIAGNOSIS — F419 Anxiety disorder, unspecified: Secondary | ICD-10-CM

## 2021-01-19 NOTE — Progress Notes (Signed)
[facility]  Outpatient Neuro Rehabilitation Department  [address] [place] [address]  Phone: [phone]    Fax: [phone]                 NEUROPSYCHOLOGICAL PROGRESS NOTE      PATIENT: Jonathan [name] DOB: 1951-04-28   MRN: [id number]  AGE: 70 y.o.    Primary Language:English    Interpreter:          Referring Provider:       Concurrent Services:  NP    Therapy Diagnosis: No diagnosis found.      Date of Service: 01/19/2021       General Observations:    Jonathan [name] appeared alert and oriented to person, place, and time.  Jonathan [name] maintained eye contact and presented as cooperative and engaged throughout the course of the meeting.  Affect was appropriate for the situation.  Thought processes were logical and goal directed.  Expressive speech was fluent.  Conversational speech was intact.  There was no evidence of psychosis or delusions.  Jonathan [name] explicitly denied SI and HI.      Symptoms:  Health and behavior intervention  sadness  I provided cognitive reframing aimed at establishing a positive view of self and future    Treatment Plan/Recommendations: Weekly behavioral intervention

## 2021-01-19 NOTE — OT Progress Note (Signed)
[facility]  Outpatient Neuro Rehabilitation Department  [address] [place] [address]  Phone: [phone]    Fax: [phone]     Occupational Therapy - Treatment Note      PATIENT: Jonathan [name] DOB: [birth year]   MRN: [id number]  AGE: 70 y.o.    Primary Language: English  Interpreter: N/A - English is preferred language     Referring Provider:       CERTIFICATION DATES: 11/17/2020- 02/15/2021     Medical Diagnosis: S06.2X1   Diffuse traumatic brain injury with loss of      05/16/2019                 consciousness of 30 minutes or less  2    G91.9     Hydrocephalus, unspecified                       11/17/2020   Therapy Diagnosis:   1. Abnormal coordination        2. Binocular vision disorder        3. Frontal lobe and executive function deficit             Date of Service: 01/19/21   Start Time: 1004   Stop Time: 1058   Time Calculation (min): 54 min   Visit #: 9     Rehabilitation Precautions/Restrictions:   Fall Risks: Impaired balance/gait, Impaired mobility    SUBJECTIVE     Patient/Caregiver Goals: 1) Be able to read and understand what pt is reading  2) Improve safety and speed during walking  3) Be able to get dressed easier  4) Be able to type faster    Patient Report: "I'm worried about Parkinson's"    OBJECTIVE     General Observation: Pt presents to OP OT session with face mask donned per COVID 19 precautions. Pt modified independent with ambulation to [employer].      Pain Assessment/Re-Assessment: Pt denies pain pre/post session     Interventions: Neuromuscular Re-Education and Therapeutic Activity      Short Term Goal 1 STG 1) (10 visits from initial evaluation on 11/17/2020) Pt will perform  simulated LB dressing with no loss of balance or increased time in order to  improve efficiency and safety with daily clothing change.   Status: Addressed   Intervention:    Task: Balance Disc   Type: Neuro Re-Ed  Details: Focus of session on balance training with use of air disc  to perform the following movement patterns with focus on challenging righting reactions, return to midline, midline crossing, and functional reach to high/low body required for ADL management.   Static Standing: 5x 60 seconds   Overhead Reach: 3 x 15; 1x loss of balance  Cross Body Reach: 1x40   Head Shoulder Waist Knee Reach: 1x20   Weighted Pass Around Body: 1x 20: 7# resistance  Squat into Press: 1x12, 7# resistance; 1x loss of balance  Step Ups: 1x7  Assist level: Minimal Assist   Skilled techniques/ assistance include physical assist to facilitate balance and right into midline with loss of balance, verbal cueing to facilitate proper weightshift to return to midline and facilitate equal distribution of weight for safety and stability .     Task: Weighted Ambulation   Type: Therapeutic Activity  Details: Focus of task on ambulation with no DME and alternating unilateral hold on 8# resistance dumbbell to improve pt stabilization with dynamic movement required  for retrieving clothing with daily dressing and perform low/waist reach for donning clothes as part of lower body dressing. Pt ambulates approximately 400 ft.   Assist level: Supervision/ CGA   Skilled techniques/ assistance include verbal cueing for posture throughout and to prompt pt on increasing amplitude of movement to reduce small step size .        Short Term Goal 2 (10 visits from initial evaluation on 11/17/2020): Pt will read short  newspaper article and recall 50% of detail in order to improve comprehension and  visual tolerance for small print reading. Status: Not addressed      Intervention:     Short Term Goal 3 (10 visits from initial evaluation on 11/17/2020): Pt will improve typing  speed based on one minute typing test to at least 26 words per minute in order  to demonstrate improvement in bilateral coordination required for computer based  IADLs.   Status: Not addressed    Intervention:         Long Term Goal 1 15 visits from initial  evaluation on 11/17/2020): Pt will perform morning  ADL routine with no increase in time per pt report and with grossly modified  independence in order to decrease burden of effort for morning ADLs.   Status: Not addressed    Intervention:     Long Term Goal 2 (15 visits from initial evaluation on 11/17/2020): Pt will improve  functional reach score to at least 15 inches in order to decrease pt fall risk  and improve safety with community level IADLs. Status: Not addressed      Intervention:     Long Term Goal 3 (15 visits from initial evaluation on 11/17/2020): Pt will perform hot  meal prep task requiring at least 50% divided attention with supervision, in  order to decrease CG burden and improve pt independence in meal prep tasks.   Status: Not addressed    Intervention:     Long Term Goal 4 (15 visits from initial evaluation on 11/17/2020): Pt will  demonstrate bilateral improvement in box and block test by at least 5 blocks in  order to demonstrate MDC required to indicate improvement in gross motor  coordination required for ADL/IADL performance.   Status: Not addressed    Intervention:      Long Term Goal 5 (15 visits from initial evaluation on 11/17/2020): Pt will improve left  hand 9 hole peg test score to at least 23 seconds in order to improve  performance to norm value to increase fine motor coordination required for ADLs. Status: Not addressed    Intervention:        Education: Education  Audience: Patient  Topics:  Disease management, balance, safety      Mode of Education Provided:  Explanation    Learner Response:  Verbalized understanding  Further Teaching Required:  Needs reinforcement      ASSESSMENT     Pt presents with ongoing improvement noted in ability to sustain standing balance on uneven, unsteady surface of balance disc and tolerates increased challenge of task demands during present session. Pt does benefit from ongoing positive reinforcement and encouragement to increase pt self perception of  performance. Patient will benefit from continued skilled outpatient Occupational Therapy services to address dynamic standing balance, fine and gross motor coordination, and activity tolerance, in order to reduce time spent on morning ADLs, and improve pt safety with daily dressing and meal prep tasks.       PLAN/RECOMMENDATIONS  Treatment Frequency/Duration: Continue Outpatient Occupational Therapy to achieve goals per previously established plan of care.     Equipment Recommended: No additional equipment/DME recommended at this time    Recommended Consults: None    Development of Plan of Care: No change to plan of care today. Patient and/or family continue to be in agreement with plan of care.     The patient/family has been instructed to contact the clinic if any questions or problems should arise.    Signature Katie Katonya Blecher MSOT OTR/L LSVT-BIG  [email or web address]

## 2021-01-21 ENCOUNTER — Ambulatory Visit
Admission: RE | Admit: 2021-01-21 | Discharge: 2021-01-21 | Disposition: A | Discharge status: Home / Self Care | Payer: Medicare Other | Source: Ambulatory Visit

## 2021-01-21 ENCOUNTER — Ambulatory Visit
Admission: RE | Admit: 2021-01-21 | Discharge: 2021-01-21 | Disposition: A | Discharge status: Home / Self Care | Payer: Medicare Other | Source: Ambulatory Visit | Attending: Specialist | Admitting: Specialist

## 2021-01-21 DIAGNOSIS — R41844 Frontal lobe and executive function deficit: Secondary | ICD-10-CM | POA: Insufficient documentation

## 2021-01-21 DIAGNOSIS — F419 Anxiety disorder, unspecified: Secondary | ICD-10-CM

## 2021-01-21 DIAGNOSIS — H5330 Unspecified disorder of binocular vision: Secondary | ICD-10-CM | POA: Insufficient documentation

## 2021-01-21 DIAGNOSIS — R278 Other lack of coordination: Secondary | ICD-10-CM | POA: Insufficient documentation

## 2021-01-21 NOTE — Progress Notes (Signed)
[facility]  Outpatient Neuro Rehabilitation Department  [address], [place] [address]  Phone: [phone]    Fax: [phone]                 NEUROPSYCHOLOGICAL PROGRESS NOTE      PATIENT: Jonathan [name] DOB: [birth year]   MRN: [id number]  AGE: 70 y.o.    Primary Language:English    Interpreter:          Referring Provider:       Concurrent Services:  NP    Therapy Diagnosis: No diagnosis found.      Date of Service: 01/21/2021       General Observations:    Jonathan [name] appeared alert and oriented to person, place, and time.  Jonathan [name] maintained eye contact and presented as cooperative and engaged throughout the course of the meeting.  Affect was appropriate for the situation.  Thought processes were logical and goal directed.  Expressive speech was fluent.  Conversational speech was intact.  There was no evidence of psychosis or delusions.  Jonathan [name] explicitly denied SI and HI.      Symptoms:  Health and behavior intervention  sadness and anxiety.  I provided cognitive reframing and interpretations aimed at increasing self-understanding and coping skills.    Treatment Plan/Recommendations: Weekly behavioral intervention

## 2021-01-21 NOTE — OT Progress Note (Signed)
[facility]  Outpatient Neuro Rehabilitation Department  [address], [place] [address]  Phone: [phone]    Fax: [phone]     Occupational Therapy - Progress Summary      PATIENT: Jonathan [name] DOB: [birth year]   MRN: [id number]  AGE: 70 y.o.    Primary Language: English  Interpreter: N/A - English is preferred language     Referring Provider:      Concurrent Services: Neuropsychology    Initial Evaluation: OT Received On: 06/23/224/19/2022 CERTIFICATION DATES: 11/17/2020- 02/15/2021   Medical Diagnosis: S01.0X3   Diffuse traumatic brain injury with loss of      05/16/2019                 consciousness of 30 minutes or less  2    G91.9     Hydrocephalus, unspecified                       11/17/2020   Therapy Diagnosis:   1. Frontal lobe and executive function deficit        2. Abnormal coordination        3. Binocular vision disorder               Date of Service: 01/21/21   Start Time: 1107   Stop Time: 1200   Time Calculation: 53 min   Visit #: 10     Medications and Allergies:     Current Outpatient Medications:     ALPRAZolam (XANAX) 0.5 MG tablet, TAKE 1 TABLET (0.5 MG) BY ORAL ROUTE 3 TIMES PER DAY AS NEEDED, Disp: , Rfl:     busPIRone (BUSPAR) 15 MG tablet, Take 15 mg by mouth 2 (two) times daily, Disp: , Rfl:     losartan (COZAAR) 50 MG tablet, Take 50 mg by mouth daily, Disp: , Rfl:     Melatonin 10 MG Cap, Take 10 mg by mouth daily, Disp: , Rfl:     sertraline (ZOLOFT) 100 MG tablet, Take 100 mg by mouth daily, Disp: , Rfl:     warfarin (COUMADIN) 10 MG tablet, Take 10 mg by mouth daily, Disp: , Rfl:    No Known Allergies    Rehabilitation Precautions/Restrictions:   Fall Risks: Impaired balance/gait, Impaired mobility      SUBJECTIVE     Patient/Caregiver Goals: 1) Be able to read and understand what pt is reading  2) Improve safety and speed during walking  3) Be able to get dressed easier  4) Be able to type faster    Patient Report: "My foot tremors when I go to put my flip  flop on"      OBJECTIVE     General Observation: Pt presents to OP OT session with face mask donned per COVID 19 precautions. Pt modified independent with single point cane for ambulation.     Pain Assessment/Re-Assessment: Pt denies pain pre/post session     Interventions/Modalities: Therapeutic Activity and Physical Performance Test  TherAct: OT discusses with pt strategies to improve pt independence in donning/doffing footwear with left lower extremity due to pt reporting increased difficulty with accuracy and coordination of left lower extremity. OT recommends utilizing non skid surfacing to improve stability of footwear when attempting to slide foot into shoe as well as performing simple lower extremity HEP of toe raises, heel raises, and hip flexion to improve strength and coordination of ankle and hip required for lifting foot into footwear. Pt agreeable  and receptive to OT strategies presented.     Pt performs typing task for 1x 3 minute and progresses towards 4 timed trials for assessment of current typing speed with use of [email or web address]. Pt results as follows:   Trial 1) 3 minute typing test: 34 words per minute, 93% accuracy   Trial 2) 1 minute typing test: 29 words per minute, 92% accuracy   Trial 3) 1 minute typing test: 30 words per minute, 97% accuracy   Trial 4) 1 minute typing test: 27 words per minute, 90% accuracy     Pt performs simulated lower body dressing from sit/ stand position x2 with pt demonstrating good ability to perform task with no environmental support required throughout task and no loss of balance noted.     Outcome Measures:      01/21/21 1200   Nine Hole Peg Test   Right Hand Trial 1 18 seconds   Left Hand Trial 1 22 seconds   Box and Blocks Test   Number of blocks transported in one minute: right hand 57   Number of blocks transported in one minute: left hand 55   Functional Reach   Trial 1 14.5 inches   Trial 2 15 inches   Trial 3 15.5 inches   Average 15 inches       Education:  Insurance claims handler: Patient  Topics:  Plan of care, pt progress     Mode of Education Provided:  Explanation    Learner Response:  Verbalized understanding  Further Teaching Required:  Needs reinforcement      ASSESSMENT     Summary of Progress: Jonathan [name] has made great progress throughout current reporting period and has met all STGs and 2 LTGs. Pt is making good progress towards gross motor coordination goals, and ADL routine goal at this time. Pt continues to present with deficits noted in movement speed, comprehension with reading, and typing speed required for IADL participations. Additionally, despite pt progress, pt does continue to present with decreased self perception of performance with ADL/IADL performance. Patient will continue to benefit from skilled outpatient Occupational Therapy services to address gross motor coordination, balance required for ADL routine, and typing and reading comprehension skills, in order to improve pt efficiency with ADLs and increase safety with IADL/ADL routine.     Medical Necessity: Patient requires outpatient occupational therapy plan in order to return to premorbid environment. Patient requires outpatient occupational therapy in order to reduce ADL or IADL assist to a premorbid level. Patient requires outpatient occupational therapy to reduce fall risk, improve muscle coordination, and improve functional mobility and independence.             Goals:    FROM INITIAL EVALUATION on 11/17/2020:    Long Term Goals Status   LTG 1: (15 visits from initial evaluation on 11/17/2020): Pt will perform morning  ADL routine with no increase in time per pt report and with grossly modified  independence in order to decrease burden of effort for morning ADLs. Progressing    LTG 2: (15 visits from initial evaluation on 11/17/2020): Pt will perform hot  meal prep task requiring at least 50% divided attention with supervision, in  order to decrease CG burden and improve pt independence in  meal prep tasks. Progressing    LTG 3: (15 visits from initial evaluation on 11/17/2020): Pt will read full  length newspaper article and recall at least 90% of detail in order to improve  visual tolerance required for  return to meaningful reading occupation. Progressing    LTG 4: (15 visits from initial evaluation on 11/17/2020): Pt will improve typing  speed to at least 35 words in one minute typing test in order to decrease burden  of effort with computer level IADLs. Progressing    LTG 5: (15 visits from initial evaluation on 11/17/2020): Pt will  demonstrate bilateral improvement in box and block test by at least 5 blocks in  order to demonstrate MDC required to indicate improvement in gross motor  coordination required for ADL/IADL performance. Progressing      LTG 6: (15 visits from initial evaluation on 11/17/2020): Pt will improve left  hand 9 hole peg test score to at least 23 seconds in order to improve  performance to norm value to increase fine motor coordination required for ADLs. Met     LTG 7: (15 visits from initial evaluation on 11/17/2020): Pt will improve  functional reach score to at least 15 inches in order to decrease pt fall risk  and improve safety with community level IADLs. Met       Short Term Goals Status   STG 1: (10 visits from initial evaluation on 11/17/2020) Pt will perform  simulated LB dressing with no loss of balance or increased time in order to  improve efficiency and safety with daily clothing change. Met   STG 2: (10 visits from initial evaluation on 11/17/2020): Pt will read short  newspaper article and recall 50% of detail in order to improve comprehension and  visual tolerance for small print reading. Met   STG 3: (10 visits from initial evaluation on 11/17/2020): Pt will improve typing  speed based on one minute typing test to at least 26 words per minute in order  to demonstrate improvement in bilateral coordination required for computer based  IADLs. Met       UPDATED  GOALS:    AS OF PROGRESS SUMMARY on 01/21/2021:    Long Term Goals Status   LTG 1: Pt will perform morning  ADL routine with no increase in time per pt report and with grossly modified  independence in order to decrease burden of effort for morning ADLs. Progressing    LTG 2: Pt will perform hot  meal prep task requiring at least 50% divided attention with supervision, in  order to decrease CG burden and improve pt independence in meal prep tasks. Progressing    LTG 3: 5 visits from 01/21/2021):Pt will read full  length newspaper article and recall at least 90% of detail in order to improve  visual tolerance required for return to meaningful reading occupation. Progressing    LTG 4: 5 visits from 01/21/2021):Pt will improve typing  speed to at least 35 words in one minute typing test in order to decrease burden  of effort with computer level IADLs. Progressing    LTG 5: 5 visits from 01/21/2021): Pt will  demonstrate bilateral improvement in box and block test by at least 5 blocks in  order to demonstrate MDC required to indicate improvement in gross motor  coordination required for ADL/IADL performance. Progressing        PLAN/ RECOMMENDATIONS      Treatment Frequency/Duration: Skilled Outpatient Occupational Therapy is recommended for 2x a week for 5 visits.    Occupational Therapy Treatment is to include: Neuromuscular Re-Education, Therapeutic Activity, Therapeutic Exercise , Self-Care/Home Management, and Physical Performance Test    Equipment Recommended: No additional equipment/DME recommended at this time  Recommended Consults: None    Development of Plan of Care: No change to plan of care today. Patient and/or family continue to be in agreement with plan of care.     The patient/family has been instructed to contact the clinic if any questions or problems should arise.    Signature Katie Shawnay Bramel MSOT OTR/L LSVT-BIG  [email or web address]

## 2021-01-26 ENCOUNTER — Ambulatory Visit
Admission: RE | Admit: 2021-01-26 | Discharge: 2021-01-26 | Disposition: A | Discharge status: Home / Self Care | Payer: Medicare Other | Source: Ambulatory Visit

## 2021-01-26 ENCOUNTER — Ambulatory Visit
Admission: RE | Admit: 2021-01-26 | Discharge: 2021-01-26 | Disposition: A | Discharge status: Home / Self Care | Payer: Medicare Other | Source: Ambulatory Visit | Attending: Specialist | Admitting: Specialist

## 2021-01-26 DIAGNOSIS — F4321 Adjustment disorder with depressed mood: Secondary | ICD-10-CM

## 2021-01-26 DIAGNOSIS — F419 Anxiety disorder, unspecified: Secondary | ICD-10-CM

## 2021-01-26 DIAGNOSIS — R278 Other lack of coordination: Secondary | ICD-10-CM

## 2021-01-26 DIAGNOSIS — R41844 Frontal lobe and executive function deficit: Secondary | ICD-10-CM

## 2021-01-26 DIAGNOSIS — I4891 Unspecified atrial fibrillation: Secondary | ICD-10-CM | POA: Insufficient documentation

## 2021-01-26 DIAGNOSIS — H5330 Unspecified disorder of binocular vision: Secondary | ICD-10-CM

## 2021-01-26 LAB — PT/INR
PT INR: 2.3 — ABNORMAL HIGH (ref 0.9–1.1)
PT: 26.7 s — ABNORMAL HIGH (ref 10.1–12.9)

## 2021-01-26 NOTE — OT Progress Note (Signed)
[facility]  Outpatient Neuro Rehabilitation Department  [address] [place] [address]  Phone: [phone]    Fax: [phone]     Occupational Therapy - Treatment Note      PATIENT: Jonathan [name] DOB: 10-04-1950   MRN: [id number]  AGE: 70 y.o.    Primary Language: English  Interpreter: N/A - English is preferred language     Referring Provider:       CERTIFICATION DATES: 11/17/2020- 02/15/2021     Medical Diagnosis: S06.2X1   Diffuse traumatic brain injury with loss of      05/16/2019                 consciousness of 30 minutes or less  2    G91.9     Hydrocephalus, unspecified                       11/17/2020   Therapy Diagnosis:   1. Frontal lobe and executive function deficit        2. Abnormal coordination        3. Binocular vision disorder        4. Anxiety        5. Adjustment disorder with depressed mood             Date of Service: 01/26/21   Start Time: 1005   Stop Time: 1100   Time Calculation (min): 55 min   Visit #: 11     Rehabilitation Precautions/Restrictions:   Fall Risks: Impaired balance/gait, Impaired mobility    SUBJECTIVE     Patient/Caregiver Goals: 1) Be able to read and understand what pt is reading  2) Improve safety and speed during walking  3) Be able to get dressed easier  4) Be able to type faster    Patient Report: Pt reports no significant change in medical condition from previous session     OBJECTIVE     General Observation: Pt presents to OP OT session with face mask donned per COVID 19 precautions. Pt modified independent in ambulation with single point cane to/from rehab lobby.          Pain Assessment/Re-Assessment: Pt denies pain pre/post session     Interventions: Neuromuscular Re-Education      Long Term Goal 1 Pt will perform morning  ADL routine with no increase in time per pt report and with grossly modified  independence in order to decrease burden of effort for morning ADLs.   Status: Addressed   Intervention:  Focus of session on use of BITS  technology to address reaction speed, movement speed, dynamic standing balance, and functional reach required for performing morning routine with reduced burden of effort and increased time. Pt performs with results as follows with increased time required for interpretation and education to pt regarding pt performance and scoring:   Visual Scanning (Single Target- Time Paced: 2:00 minutes [time per trial])   Trial 1) Accuracy: 98.88%, Reaction Time: 1.35 seconds, Hits: 88                Central Fixation: Accuracy: 96%, Changes: 25, Attended: 24, Unattended 1                Additional details: Bilateral UEs, standing     Visual Scanning (Single Target- Time Paced: 2:00 minutes [time per trial])   Trial 2) Accuracy: 95.7%, Reaction Time: 1.34 seconds, Hits: 89  Central Fixation: Accuracy: 91.3%, Changes: 23, Attended: 21, Unattended 2                Additional details: Bilateral UEs, standing     Visual Scanning (Single Target- Time Paced: 2:00 minutes [time per trial])   Trial 3) Accuracy: 97.27%, Reaction Time: 1.11 seconds, Hits: 107    Visual Scanning (Single Target- Time Paced: 1:00 minutes [time per trial])   Trial 4) Accuracy: 98.25%, Reaction Time: 1.06 seconds, Hits: 56      Visual Scanning (Complex Array- Sequence)   Trial 1) Accuracy: 100%,  Time to complete: 0:44, Reaction Time: 1.68 seconds seconds, Hits: 26                Central Fixation: Accuracy: 100%, Changes: 5, Attended: 5, Unattended 0                Additional details: BUEs, standing     Visual Scanning (Complex Array- Sequence)   Trial 2) Accuracy: 96.3%,  Time to complete: 1:04 minutes, Reaction Time: 2.48 seconds, Hits: 26                Central Fixation: Accuracy: 85.71%, Changes: 7, Attended: 6, Unattended 1                Additional details: Persistence on; standing, BUEs    Balance Pad:   Visual Scanning (Single Target- User Paced: 2:00 minutes [time per trial])   Trial 1) Accuracy: 98.23%, Reaction Time: 1.07 seconds, Hits:  111  Visual Scanning (Single Target- Timed Paced: 2:00 minutes [time per trial])   Trial 2) Accuracy: 98.31%, Reaction Time: 1.03 seconds, Hits: 116   Long Term Goal 2 Pt will perform hot  meal prep task requiring at least 50% divided attention with supervision, in  order to decrease CG burden and improve pt independence in meal prep tasks. Status: Not addressed      Intervention:     Long Term Goal 3 Pt will read full  length newspaper article and recall at least 90% of detail in order to improve  visual tolerance required for return to meaningful reading occupation.   Status: Not addressed    Intervention:     Long Term Goal 4 Pt will improve typing  speed to at least 35 words in one minute typing test in order to decrease burden  of effort with computer level IADLs.   Status: Not addressed    Intervention:      Long Term Goal 5 Pt will  demonstrate bilateral improvement in box and block test by at least 5 blocks in  order to demonstrate MDC required to indicate improvement in gross motor  coordination required for ADL/IADL performance. Status: Not addressed    Intervention:        Education: Education  Audience: Patient  Topics:  Progress from previous attempts with BITS, pt progress and translation of BITS task into occupational performance     Mode of Education Provided:  Explanation    Learner Response:  Verbalized understanding  Further Teaching Required:  Needs reinforcement      ASSESSMENT     Pt demonstrates improvement in reaction speed, and accuracy with BITS performance when compared to previous attempts at use of BITS. Pt does demonstrate decreased self perception of performance and benefits from education regarding progress, previous attempt scores, and influence of parameters on scoring. Patient will benefit from continued skilled outpatient Occupational Therapy services to address gross motor coordination, dynamic standing balance, and movement  speed, in order to decrease burden of effort during  morning ADL routine.       PLAN/RECOMMENDATIONS      Treatment Frequency/Duration: Continue Outpatient Occupational Therapy to achieve goals per previously established plan of care.     Equipment Recommended: No additional equipment/DME recommended at this time    Recommended Consults: None    Development of Plan of Care: No change to plan of care today. Patient and/or family continue to be in agreement with plan of care.     The patient/family has been instructed to contact the clinic if any questions or problems should arise.    Signature Katie Eurika Sandy MSOT OTR/L LSVT-BIG  [email or web address]

## 2021-01-26 NOTE — Progress Notes (Signed)
[facility]  Outpatient Neuro Rehabilitation Department  [address] [place] [address]  Phone: [phone]    Fax: [phone]                 NEUROPSYCHOLOGICAL PROGRESS NOTE      PATIENT: Jonathan [name] DOB: 1951/05/30   MRN: [id number]  AGE: 70 y.o.    Primary Language:English    Interpreter:          Referring Provider:         Date of Service: 01/26/2021       General Observations:    [name] appeared alert and oriented to person, place, and time.  [name] maintained eye contact and presented as cooperative and engaged throughout the course of the meeting.  Affect was appropriate for the situation.  Thought processes were logical and goal directed.  Expressive speech was fluent.  Conversational speech was intact.  There was no evidence of psychosis or delusions.  [name] explicitly denied SI and HI.    Intervention:  Health and behavior intervention    Symptoms:  anxiety  I provided interpretations and clarifications aimed at improving emotion regulation.    Treatment Plan/Recommendations: Weekly behavioral intervention

## 2021-01-28 ENCOUNTER — Ambulatory Visit
Admission: RE | Admit: 2021-01-28 | Discharge: 2021-01-28 | Disposition: A | Discharge status: Home / Self Care | Payer: Medicare Other | Source: Ambulatory Visit | Attending: Specialist | Admitting: Specialist

## 2021-01-28 DIAGNOSIS — S069X1D Unspecified intracranial injury with loss of consciousness of 30 minutes or less, subsequent encounter: Secondary | ICD-10-CM

## 2021-01-28 DIAGNOSIS — X58XXXD Exposure to other specified factors, subsequent encounter: Secondary | ICD-10-CM | POA: Insufficient documentation

## 2021-01-28 DIAGNOSIS — H5330 Unspecified disorder of binocular vision: Secondary | ICD-10-CM

## 2021-01-28 DIAGNOSIS — R278 Other lack of coordination: Secondary | ICD-10-CM | POA: Insufficient documentation

## 2021-01-28 DIAGNOSIS — R41844 Frontal lobe and executive function deficit: Secondary | ICD-10-CM | POA: Insufficient documentation

## 2021-01-28 DIAGNOSIS — S060X1D Concussion with loss of consciousness of 30 minutes or less, subsequent encounter: Secondary | ICD-10-CM | POA: Insufficient documentation

## 2021-01-28 NOTE — OT Progress Note (Signed)
[facility]  Outpatient Neuro Rehabilitation Department  [address], [place] [address]  Phone: [phone]    Fax: [phone]     Occupational Therapy - Treatment Note      PATIENT: Jonathan [name] DOB: 22-Sep-1950   MRN: [id number]  AGE: 70 y.o.    Primary Language: English  Interpreter: N/A - English is preferred language     Referring Provider:       CERTIFICATION DATES: 11/17/2020- 02/15/2021     Medical Diagnosis: S06.2X1   Diffuse traumatic brain injury with loss of      05/16/2019                 consciousness of 30 minutes or less  2    G91.9     Hydrocephalus, unspecified                       11/17/2020   Therapy Diagnosis:   1. Binocular vision disorder        2. Frontal lobe and executive function deficit        3. Abnormal coordination        4. Traumatic brain injury, with loss of consciousness of 30 minutes or less, subsequent encounter             Date of Service: 01/28/21   Start Time: 1003   Stop Time: 1058   Time Calculation (min): 55 min   Visit #: 12     Rehabilitation Precautions/Restrictions:   Fall Risks: Impaired balance/gait, Impaired mobility    SUBJECTIVE     Patient/Caregiver Goals: 1) Be able to read and understand what pt is reading  2) Improve safety and speed during walking  3) Be able to get dressed easier  4) Be able to type faster    Patient Report: "I feel uncoordinated with walking"    OBJECTIVE     General Observation: Pt presents to OP OT session with face mask donned per COVID 19 precautions. Pt modified independent with ambulation with single point cane to/from rehab lobby.          Pain Assessment/Re-Assessment: Pt denies pain pre/post session     Interventions: Therapeutic Activity    Long Term Goal 1 Pt will perform morning  ADL routine with no increase in time per pt report and with grossly modified  independence in order to decrease burden of effort for morning ADLs.   Status: Addressed   Intervention:   Task: Dynamic Standing Balance on Foam  Pad  Type: Therapeutic Activity  Details: Pt performs varying dynamic standing balance tasks on foam balance pad to increase pt balance challenge, righting reactions, and activity tolerance with OT increasing resistance of tasks and reps to increase endurance challenge. Pt performs the following:   - Heel Raises 3x10   - Overhead weighted ball raise: 3x 60 seconds with pt performing 36 reps, 40.5 reps, and 50 reps  - Low reach with resistance ball: 2x30 reps   Assist level: Supervision/ CGA   Skilled techniques/ assistance include intermittent tactile cueing required to facilitate steadying assist and cue pt for righting self on foam balance pad .     Task: Single Leg Stands  Type: Therapeutic Activity  Details: Pt performs alternating single leg stands to improve pt stability and coordination required for safe completion of ADL routine. Pt demonstrates alternating performance with each lower extremity.   Assist level: Supervision/ CGA   Skilled techniques/ assistance  include tactile cueing to facilitate balance on foam balance pad .      Long Term Goal 2 Pt will perform hot  meal prep task requiring at least 50% divided attention with supervision, in  order to decrease CG burden and improve pt independence in meal prep tasks. Status: Not addressed      Intervention:      Long Term Goal 3 5 visits from 01/21/2021):Pt will read full  length newspaper article and recall at least 90% of detail in order to improve  visual tolerance required for return to meaningful reading occupation.   Status: Addressed   Intervention:   Task: Dual Task Reading  Type: Therapeutic Activity  Details: Pt performs 2x reading with initial trial performed with internal reading, second trial performed reading aloud with instruction to perform dynamic balance task on foam balance pad to increase dual task challenge with reading comprehension. Pt instructed in question/answer following reading task to challenge comprehension. Trial 1 pt with 50%  accuracy, and trial 2 with 90% accuracy.   Assist level: Supervision/ CGA   Skilled techniques/ assistance include Pt requires skilled guarding for pt safety and positioning of proximity to pt to ensure optimal performance and safety .        Long Term Goal 4 5 visits from 01/21/2021):Pt will improve typing  speed to at least 35 words in one minute typing test in order to decrease burden  of effort with computer level IADLs.   Status: Not addressed    Intervention:      Long Term Goal 5 5 visits from 01/21/2021): Pt will  demonstrate bilateral improvement in box and block test by at least 5 blocks in  order to demonstrate MDC required to indicate improvement in gross motor  coordination required for ADL/IADL performance. Status: Not addressed    Intervention:        Education  Audience: Patient  Topics: Plan of care   Mode of Education Provided:  Explanation    Learner Response:  Verbalized understanding  Further Teaching Required:  No further teaching required      ASSESSMENT     Pt demonstrates ongoing improvements in dynamic standing balance with performance on balance pad to challenge pt. Additionally, pt is demonstrating improved activity tolerance and endurance indicated by pt requiring less rest breaks with weighted and standing tasks. Patient will benefit from continued skilled outpatient Occupational Therapy services to address functional activity tolerance, visual tolerance, and dynamic standing balance, in order to decrease effort required for morning ADL routine.       PLAN/RECOMMENDATIONS      Treatment Frequency/Duration: Continue Outpatient Occupational Therapy to achieve goals per previously established plan of care.     Equipment Recommended: No additional equipment/DME recommended at this time    Recommended Consults: None    Development of Plan of Care: No change to plan of care today. Patient and/or family continue to be in agreement with plan of care.     The patient/family has been instructed to  contact the clinic if any questions or problems should arise.    Katie Devonne Kitchen MSOT OTR/L LSVT-BIG  [email or web address]

## 2021-02-02 ENCOUNTER — Ambulatory Visit: Payer: Self-pay

## 2021-02-02 ENCOUNTER — Ambulatory Visit
Admission: RE | Admit: 2021-02-02 | Discharge: 2021-02-02 | Disposition: A | Discharge status: Home / Self Care | Payer: Medicare Other | Source: Ambulatory Visit | Attending: Specialist | Admitting: Specialist

## 2021-02-02 DIAGNOSIS — S062X1S Diffuse traumatic brain injury with loss of consciousness of 30 minutes or less, sequela: Secondary | ICD-10-CM | POA: Insufficient documentation

## 2021-02-02 DIAGNOSIS — R41844 Frontal lobe and executive function deficit: Secondary | ICD-10-CM | POA: Insufficient documentation

## 2021-02-02 DIAGNOSIS — F4321 Adjustment disorder with depressed mood: Secondary | ICD-10-CM | POA: Insufficient documentation

## 2021-02-02 DIAGNOSIS — R278 Other lack of coordination: Secondary | ICD-10-CM

## 2021-02-02 DIAGNOSIS — S069X1D Unspecified intracranial injury with loss of consciousness of 30 minutes or less, subsequent encounter: Secondary | ICD-10-CM

## 2021-02-02 DIAGNOSIS — H5330 Unspecified disorder of binocular vision: Secondary | ICD-10-CM

## 2021-02-02 DIAGNOSIS — X58XXXS Exposure to other specified factors, sequela: Secondary | ICD-10-CM | POA: Insufficient documentation

## 2021-02-02 NOTE — OT Progress Note (Signed)
[facility]  Outpatient Neuro Rehabilitation Department  [address], [place] [address]  Phone: [phone]    Fax: [phone]     Occupational Therapy - Treatment Note      PATIENT: Jonathan [name] DOB: 03-01-1951   MRN: [id number]  AGE: 70 y.o.    Primary Language: English  Interpreter: N/A - English is preferred language     Referring Provider:  Roland Rack, MD    CERTIFICATION DATES: 11/17/2020- 02/15/2021     Medical Diagnosis: S06.2X1   Diffuse traumatic brain injury with loss of      05/16/2019                 consciousness of 30 minutes or less  2    G91.9     Hydrocephalus, unspecified                       11/17/2020   Therapy Diagnosis:   1. Binocular vision disorder        2. Traumatic brain injury, with loss of consciousness of 30 minutes or less, subsequent encounter        3. Abnormal coordination        4. Frontal lobe and executive function deficit             Date of Service: 02/02/21   Start Time: 1007   Stop Time: 1100   Time Calculation (min): 53 min   Visit #: 13     Rehabilitation Precautions/Restrictions:   Fall Risks: Impaired balance/gait, Impaired mobility    SUBJECTIVE     Patient/Caregiver Goals: 1) Be able to read and understand what pt is reading  2) Improve safety and speed during walking  3) Be able to get dressed easier  4) Be able to type faster    Patient Report: Pt reports ongoing difficulty with balance during ADL/ community IADLs    OBJECTIVE     General Observation: Pt presents to OP OT session with face mask donned per COVID 19 precautions. Pt present with no DME, supervision with ambulation to/from rehab lobby.      Pain Assessment/Re-Assessment: Pt denies pain pre/post session     Interventions: Therapeutic Activity    Long Term Goal 1 Pt will perform morning  ADL routine with no increase in time per pt report and with grossly modified  independence in order to decrease burden of effort for morning ADLs.   Status: Addressed   Intervention:   Task:  Balance Pad  Type: Therapeutic Activity  Details: Focus of task on completing various standing balance tasks of foam balance pad and progression to air disc in order to improve pt safety and independence with ADL routine. Pt completes the following tasks/sets/reps as follows:   - Heel to toe rocking: 2x15, foam balance pad  - Lateral weight shift with LE raise: 2x15, foam balance pad   - Semi Tandem Trunk Rotation, 2x15, foam balance pad  - Semi Tandem Anterior Reach, BUE, 2x15, foam balance pad  - Semi tandem overhead reach, BUE, 2x15, foam balance pad  - Static standing, 3 minutes, air disc  - Overhead reach, BUE, 2x15, air disc   Assist level: Moderate Assist   Skilled techniques/ assistance include Physical assist required throughout to facilitate balance and stability throughout with focus on stabilizing pt for pt to self correct balance as needed, increased physical assist required for progression to air disc due to increased balance challenge.  OT performs skilled grading of task demands based on pt performance .      Long Term Goal 2 Pt will perform hot  meal prep task requiring at least 50% divided attention with supervision, in  order to decrease CG burden and improve pt independence in meal prep tasks. Status: Not addressed      Intervention:      Long Term Goal 3 Pt will read full  length newspaper article and recall at least 90% of detail in order to improve  visual tolerance required for return to meaningful reading occupation.   Status: Not addressed    Intervention:      Long Term Goal 4 Pt will improve typing  speed to at least 35 words in one minute typing test in order to decrease burden  of effort with computer level IADLs.   Status: Addressed   Intervention:   Task: Typing   Type: Therapeutic Activity  Details: Pt performs 3:00 minute typing tests to improve pt ability to manage computer based IADL tasks. Pt performs 3 trials with the following results:   1) 20 WPM, 93% accuracy (pt time  influenced by technology difficulties throughout task)   2) 29 WPM, 95% accuracy   3) 31 WPM, 94% accuracy   Assist level: Supervision/ CGA   Skilled techniques/ assistance include OT provides modification to pt position/ keyboard set up to optimize pt posture and form throughout to facilitate improve distal coordination  .        Long Term Goal 5 Pt will  demonstrate bilateral improvement in box and block test by at least 5 blocks in  order to demonstrate MDC required to indicate improvement in gross motor  coordination required for ADL/IADL performance. Status: Not addressed    Intervention:        Education  Audience: Patient  Topics: Safety , balance retraining  Mode of Education Provided:  Explanation    Learner Response:  Verbalized understanding  Further Teaching Required:  Needs reinforcement      ASSESSMENT     Pt is demonstrating ongoing progress in regards to pt balance performance and ability to correct balance when challenge on uneven surfaces. Pt continues to demonstrate minimal decrease in fine motor coordination with typing tasks and will benefit from ongoing training in coordination/ dexterity. Patient will benefit from continued skilled outpatient Occupational Therapy services to address fine motor coordination, dynamic standing balance, and overall safety, in order to improve pt independence and safety with home and community ADL/IADLs.     Heel to toe rocks, skiiers, tandem trunk rotation, tandem stand anterior, overhead reaching       PLAN/RECOMMENDATIONS      Treatment Frequency/Duration: Continue Outpatient Occupational Therapy to achieve goals per previously established plan of care.     Equipment Recommended: No additional equipment/DME recommended at this time    Recommended Consults: None    Development of Plan of Care: No change to plan of care today. Patient and/or family continue to be in agreement with plan of care.     The patient/family has been instructed to contact the clinic if  any questions or problems should arise.    Katie Celsa Nordahl MSOT OTR/L LSVT-BIG  [email or web address]

## 2021-02-04 ENCOUNTER — Ambulatory Visit
Admission: RE | Admit: 2021-02-04 | Discharge: 2021-02-04 | Disposition: A | Discharge status: Home / Self Care | Payer: Medicare Other | Source: Ambulatory Visit

## 2021-02-04 ENCOUNTER — Ambulatory Visit
Admission: RE | Admit: 2021-02-04 | Discharge: 2021-02-04 | Disposition: A | Discharge status: Home / Self Care | Payer: Medicare Other | Source: Ambulatory Visit | Attending: Physician Assistant | Admitting: Physician Assistant

## 2021-02-04 DIAGNOSIS — S069X1D Unspecified intracranial injury with loss of consciousness of 30 minutes or less, subsequent encounter: Secondary | ICD-10-CM

## 2021-02-04 DIAGNOSIS — F4321 Adjustment disorder with depressed mood: Secondary | ICD-10-CM | POA: Insufficient documentation

## 2021-02-04 DIAGNOSIS — R278 Other lack of coordination: Secondary | ICD-10-CM

## 2021-02-04 DIAGNOSIS — H5330 Unspecified disorder of binocular vision: Secondary | ICD-10-CM

## 2021-02-04 DIAGNOSIS — F419 Anxiety disorder, unspecified: Secondary | ICD-10-CM

## 2021-02-04 NOTE — OT Progress Note (Signed)
[facility]  Outpatient Neuro Rehabilitation Department  [address] [place] [address]  Phone: [phone]    Fax: [phone]     Occupational Therapy - Treatment Note      PATIENT: Jonathan [name] DOB: 1950/08/07   MRN: [id number]  AGE: 70 y.o.    Primary Language: English  Interpreter: N/A - English is preferred language     Referring Provider:  Roland Rack, MD    CERTIFICATION DATES: 11/17/2020- 02/15/2021     Medical Diagnosis: S06.2X1   Diffuse traumatic brain injury with loss of      05/16/2019                 consciousness of 30 minutes or less  2    G91.9     Hydrocephalus, unspecified                       11/17/2020   Therapy Diagnosis:   1. Binocular vision disorder        2. Traumatic brain injury, with loss of consciousness of 30 minutes or less, subsequent encounter        3. Abnormal coordination             Date of Service: 02/04/21   Start Time: 1007   Stop Time: 1100   Time Calculation (min): 53 min   Visit #: 14     Rehabilitation Precautions/Restrictions:   Fall Risks: Impaired balance/gait, Impaired mobility    SUBJECTIVE     Patient/Caregiver Goals: 1) Be able to read and understand what pt is reading  2) Improve safety and speed during walking  3) Be able to get dressed easier  4) Be able to type faster    Patient Report: "Is it worse than last time?" Re: pt performance on BITS    OBJECTIVE     General Observation: Pt presents to OP OT session with face mask donned per COVID 19 precautions. Pt mod independent with ambulation with single point cane  to/from rehab lobby.       Pain Assessment/Re-Assessment: Pt denies pain pre/post session     Interventions: Therapeutic Activity      Long Term Goal 1 Pt will perform morning  ADL routine with no increase in time per pt report and with grossly modified  independence in order to decrease burden of effort for morning ADLs.   Status: Addressed   Intervention:   Task: Dual Task Walking  Type: Therapeutic Activity  Details: Pt  performs dual tasking walk with instruction to perform alternating bean bag toss with word recall for increased duration in order to increase pt efficiency with ambulatory ADLs  Assist level: Supervision/ CGA   Skilled techniques/ assistance include supervision for safety, verbal cueing for follow through with step size throughout for consistency with ambulation performance .      Long Term Goal 2 Pt will perform hot  meal prep task requiring at least 50% divided attention with supervision, in  order to decrease CG burden and improve pt independence in meal prep tasks. Status: Addressed     Intervention:   Focus of intervention on use of BITS technology to improve divided attention, sequencing, and dual tasking. Pt performs with the following results with increased time for interpretation of results to pt.   Visual Scanning (Complex Array- Sequence)   Trial 1) Accuracy: 100%,  Time to complete: 0:54, Reaction Time: 2.08 seconds, Hits: 26  Central Fixation: Accuracy: 85.71%, Changes: 7, Attended: 6, Unattended 1                Additional details: standing   Visual Scanning (Complex Array- Sequence)   Trial 2) Accuracy: 96.3%,  Time to complete: 1:07, Reaction Time: 2.59 seconds, Hits: 26                Central Fixation: Accuracy: 100%, Changes: 6, Attended: 6, Unattended 0                Additional details: standing   Visual Scanning (Complex Array- Sequence)   Trial 3) Accuracy: 92.86%,  Time to complete: 0:54, Reaction Time: 2.06 seconds, Hits: 26                Central Fixation: Accuracy: 66.67%, Changes: 6, Attended: 4, Unattended 2                Additional details: standing   Visual Scanning (Complex Array- Sequence)   Trial 4) Accuracy: 86.67%,  Time to complete: 1:43, Reaction Time: 3.98 seconds, Hits: 26                Central Fixation: Accuracy: 85.71%, Changes: 7, Attended: 6, Unattended 1                Additional details: standing, persistence on  Visual Scanning (Complex Array- Sequence)    Trial 5) Accuracy: 92.86%,  Time to complete: 1:18, Reaction Time: 3.00 seconds, Hits: 26                Central Fixation: Accuracy: 66.67%, Changes: 6, Attended: 4, Unattended 2                Additional details: standing, persistence on  Visual Scanning (Complex Array- Sequence)   Trial 6) Accuracy: 92.86%,  Time to complete: 1:26  Reaction Time: 3.32 seconds, Hits: 26             - reverse  Visual Scanning (Complex Array- Sequence)   Trial 7) Accuracy: 92.86%,  Time to complete: 1:20, Reaction Time: 3.02 seconds, Hits: 26      - reverse   Visual Scanning (Complex Array- Sequence)   Trial 8) Accuracy: 96.3%,  Time to complete: 1:22, Reaction Time: 3.17 seconds, Hits: 26                Central Fixation: Accuracy: 85.71%, Changes: 7, Attended: 6, Unattended 1                Additional details: standing, reverse   Visual Scanning (Complex Array- Sequence)   Trial 9) Accuracy: 92.86%,  Time to complete: 2:34, Reaction Time: 5.93 seconds, Hits: 26                Central Fixation: Accuracy: 66.67%, Changes: 6, Attended: 4, Unattended 2                Additional details: standing, reverse, with instruction to identify/associate word with each letter   Visual Scanning (Complex Array- Sequence)   Trial 10) Accuracy: 96.3%,  Time to complete: 2:27, Reaction Time: 5.15 seconds, Hits: 26                Central Fixation: Accuracy: 100%, Changes: 5, Attended: 5, Unattended 0                Additional details: standing, reverse, standing, reverse, with instruction to identify/associate word with each letter    Visual Scanning (Complex Array- Sequence)  Trial 11) Accuracy: 96.3%,  Time to complete: 2:17, Reaction Time: 5.26 seconds, Hits: 26                Central Fixation: Accuracy: 85.71%, Changes: 7, Attended: 6, Unattended 2                Additional details: standing, reverse, persistence on        Long Term Goal 3 Pt will read full  length newspaper article and recall at least 90% of detail in order to improve  visual  tolerance required for return to meaningful reading occupation.   Status: Not addressed    Intervention:      Long Term Goal 4 Pt will improve typing  speed to at least 35 words in one minute typing test in order to decrease burden  of effort with computer level IADLs.   Status: Not addressed    Intervention:      Long Term Goal 5 Pt will  demonstrate bilateral improvement in box and block test by at least 5 blocks in  order to demonstrate MDC required to indicate improvement in gross motor  coordination required for ADL/IADL performance. Status: Not addressed    Intervention:        Education  Audience: Patient  Topics: Plan of care   Mode of Education Provided:  Explanation    Learner Response:  Verbalized understanding  Further Teaching Required:  No further teaching required      ASSESSMENT     Pt is demonstrating ongoing good performance with use of BITS with pt receptive to OT increasing task demands and dual tasking challenge throughout session. Pt demonstrates most difficulty with word recall/ finding throughout BITS tasks and scores reflect need for increased time to process/react to increase visual stimuli (complex array sequence).  Patient will benefit from continued skilled outpatient Occupational Therapy services to address dynamic standing balance, dual tasking, and fine motor coordination, in order to improve pt efficiency with ADL routine and improve pt safety for community and household IADLs.       PLAN/RECOMMENDATIONS      Treatment Frequency/Duration: Continue Outpatient Occupational Therapy to achieve goals per previously established plan of care.     Equipment Recommended: No additional equipment/DME recommended at this time    Recommended Consults: None    Development of Plan of Care: No change to plan of care today. Patient and/or family continue to be in agreement with plan of care.     The patient/family has been instructed to contact the clinic if any questions or problems should  arise.    Katie Anuhea Gassner MSOT OTR/L LSVT-BIG  [email or web address]

## 2021-02-04 NOTE — Progress Notes (Signed)
[facility]  Outpatient Neuro Rehabilitation Department  [address] [place] [address]  Phone: [phone]    Fax: [phone]                 NEUROPSYCHOLOGICAL PROGRESS NOTE      PATIENT: Jonathan [name] DOB: Jun 10, 1951   MRN: [id number]  AGE: 70 y.o.    Primary Language:English    Interpreter:          Referring Provider:         Date of Service: 02/04/2021       General Observations:    [name] appeared alert and oriented to person, place, and time.  [name] maintained eye contact and presented as cooperative and engaged throughout the course of the meeting.  Affect was appropriate for the situation.  Thought processes were logical and goal directed.  Expressive speech was fluent.  Conversational speech was intact.  There was no evidence of psychosis or delusions.  [name] explicitly denied SI and HI.    Intervention:  Health and behavior intervention    Symptoms:  sadness and anxiety  I provided active listening and positive statements aimed at enhancing a positive view of self and others.    Treatment Plan/Recommendations: Weekly behavioral intervention

## 2021-02-09 ENCOUNTER — Ambulatory Visit
Admission: RE | Admit: 2021-02-09 | Discharge: 2021-02-09 | Disposition: A | Discharge status: Home / Self Care | Payer: Medicare Other | Source: Ambulatory Visit | Attending: Physician Assistant | Admitting: Physician Assistant

## 2021-02-09 ENCOUNTER — Ambulatory Visit
Admission: RE | Admit: 2021-02-09 | Discharge: 2021-02-09 | Disposition: A | Discharge status: Home / Self Care | Payer: Medicare Other | Source: Ambulatory Visit

## 2021-02-09 DIAGNOSIS — F419 Anxiety disorder, unspecified: Secondary | ICD-10-CM | POA: Insufficient documentation

## 2021-02-09 DIAGNOSIS — R278 Other lack of coordination: Secondary | ICD-10-CM

## 2021-02-09 DIAGNOSIS — S069X1D Unspecified intracranial injury with loss of consciousness of 30 minutes or less, subsequent encounter: Secondary | ICD-10-CM

## 2021-02-09 DIAGNOSIS — H5330 Unspecified disorder of binocular vision: Secondary | ICD-10-CM

## 2021-02-09 DIAGNOSIS — R41844 Frontal lobe and executive function deficit: Secondary | ICD-10-CM
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
## 2021-02-09 NOTE — Progress Notes (Signed)
[facility]  Outpatient Neuro Rehabilitation Department  [address] [place] [address]  Phone: [phone]    Fax: [phone]                 NEUROPSYCHOLOGICAL PROGRESS NOTE      PATIENT: Jonathan [name] DOB: 02/26/1951   MRN: [id number]  AGE: 70 y.o.    Primary Language:English    Interpreter:          Referring Provider:         Date of Service: 02/09/2021       General Observations:    [name] appeared alert and oriented to person, place, and time.  [name] maintained eye contact and presented as cooperative and engaged throughout the course of the meeting.  Affect was appropriate for the situation.  Thought processes were logical and goal directed.  Expressive speech was fluent.  Conversational speech was intact.  There was no evidence of psychosis or delusions.  [name] explicitly denied SI and HI.    Intervention:  Health and behavior intervention    Symptoms:  sadness  I provided active listening and cognitive reframing aimed at enhancing a positive view of self and future    Treatment Plan/Recommendations: Weekly behavioral intervention

## 2021-02-11 ENCOUNTER — Ambulatory Visit
Admission: RE | Admit: 2021-02-11 | Discharge: 2021-02-11 | Disposition: A | Discharge status: Home / Self Care | Payer: Medicare Other | Source: Ambulatory Visit | Attending: Specialist | Admitting: Specialist

## 2021-02-11 DIAGNOSIS — F4321 Adjustment disorder with depressed mood: Secondary | ICD-10-CM | POA: Insufficient documentation

## 2021-02-11 DIAGNOSIS — F419 Anxiety disorder, unspecified: Secondary | ICD-10-CM

## 2021-02-11 NOTE — Progress Notes (Signed)
[facility]  Outpatient Neuro Rehabilitation Department  [address] [place] [address]  Phone: [phone]    Fax: [phone]                 NEUROPSYCHOLOGICAL PROGRESS NOTE      PATIENT: Jonathan [name] DOB: 03/16/1951   MRN: [id number]  AGE: 70 y.o.    Primary Language:English    Interpreter:          Referring Provider: Roland Rack, MD       Date of Service: 02/11/2021       General Observations:    Jonathan [name] appeared alert and oriented to person, place, and time.  Jonathan [name] maintained eye contact and presented as cooperative and engaged throughout the course of the meeting.  Affect was appropriate for the situation.  Thought processes were logical and goal directed.  Expressive speech was fluent.  Conversational speech was intact.  There was no evidence of psychosis or delusions.  Jonathan [name] explicitly denied SI and HI.    Intervention:  Health and behavior intervention    Symptoms:  sadness and anxiety  I provided cognitive reframing aimed at enhancing emotion regulation skills    Treatment Plan/Recommendations: Weekly behavioral intervention

## 2021-02-16 ENCOUNTER — Ambulatory Visit
Admission: RE | Admit: 2021-02-16 | Discharge: 2021-02-16 | Disposition: A | Discharge status: Home / Self Care | Payer: Medicare Other | Source: Ambulatory Visit | Attending: Specialist | Admitting: Specialist

## 2021-02-16 DIAGNOSIS — I4891 Unspecified atrial fibrillation: Secondary | ICD-10-CM | POA: Insufficient documentation

## 2021-02-16 DIAGNOSIS — F419 Anxiety disorder, unspecified: Secondary | ICD-10-CM | POA: Insufficient documentation

## 2021-02-16 LAB — PT/INR
PT INR: 2.9 — ABNORMAL HIGH (ref 0.9–1.1)
PT: 33.7 s — ABNORMAL HIGH (ref 10.1–12.9)

## 2021-02-16 NOTE — Progress Notes (Signed)
[facility]  Outpatient Neuro Rehabilitation Department  [address], [place] [address]  Phone: [phone]    Fax: [phone]                 NEUROPSYCHOLOGICAL PROGRESS NOTE      PATIENT: JAYMOND WAAGE DOB: September 20, 1950   MRN: [id number]  AGE: 70 y.o.    Primary Language:English    Interpreter:          Referring Provider:         Date of Service: 02/16/2021       General Observations:    [name] appeared alert and oriented to person, place, and time.  [name] maintained eye contact and presented as cooperative and engaged throughout the course of the meeting.  Affect was appropriate for the situation.  Thought processes were logical and goal directed.  Expressive speech was fluent.  Conversational speech was intact.  There was no evidence of psychosis or delusions.  [name] explicitly denied SI and HI.    Intervention:  Health and behavior intervention    Symptoms:  sadness and anxiety  I provided cognitive reframing, active listening, and interpretations aimed at supporting coping skills    Treatment Plan/Recommendations: Weekly behavioral intervention

## 2021-02-18 ENCOUNTER — Ambulatory Visit: Payer: Self-pay

## 2021-02-23 ENCOUNTER — Ambulatory Visit
Admission: RE | Admit: 2021-02-23 | Discharge: 2021-02-23 | Disposition: A | Discharge status: Home / Self Care | Payer: Medicare Other | Source: Ambulatory Visit

## 2021-02-23 DIAGNOSIS — R4589 Other symptoms and signs involving emotional state: Secondary | ICD-10-CM | POA: Insufficient documentation

## 2021-02-23 DIAGNOSIS — F419 Anxiety disorder, unspecified: Secondary | ICD-10-CM

## 2021-02-23 DIAGNOSIS — Z7689 Persons encountering health services in other specified circumstances: Secondary | ICD-10-CM | POA: Insufficient documentation

## 2021-02-23 NOTE — Progress Notes (Signed)
[facility]  Outpatient Neuro Rehabilitation Department  [address] [place] [address]  Phone: [phone]    Fax: [phone]                 NEUROPSYCHOLOGICAL PROGRESS NOTE      PATIENT: Jonathan [name] DOB: 1950/12/01   MRN: [id number]  AGE: 70 y.o.    Primary Language:English    Interpreter:          Referring Provider:         Date of Service: 02/23/2021       General Observations:    Jonathan [name] appeared alert and oriented to person, place, and time.  Jonathan [name] maintained eye contact and presented as cooperative and engaged throughout the course of the meeting.  Affect was appropriate for the situation.  Thought processes were logical and goal directed.  Expressive speech was fluent.  Conversational speech was intact.  There was no evidence of psychosis or delusions.  Jonathan [name] explicitly denied SI and HI.    Intervention:  Health and behavior intervention    Symptoms:  sadness and anxiety  I provided empathic statements and cognitive reframing aimed at enhancing coping skills.    Treatment Plan/Recommendations: Weekly behavioral intervention

## 2021-02-25 ENCOUNTER — Ambulatory Visit
Admission: RE | Admit: 2021-02-25 | Discharge: 2021-02-25 | Disposition: A | Discharge status: Home / Self Care | Payer: Medicare Other | Source: Ambulatory Visit | Attending: Physician Assistant | Admitting: Physician Assistant

## 2021-02-25 ENCOUNTER — Ambulatory Visit: Payer: Self-pay

## 2021-02-25 ENCOUNTER — Inpatient Hospital Stay
Admission: RE | Admit: 2021-02-25 | Discharge: 2021-02-25 | Disposition: A | Discharge status: Home / Self Care | Payer: Medicare Other | Source: Ambulatory Visit | Attending: Specialist | Admitting: Specialist

## 2021-02-25 DIAGNOSIS — Z9181 History of falling: Secondary | ICD-10-CM | POA: Insufficient documentation

## 2021-02-25 DIAGNOSIS — R2689 Other abnormalities of gait and mobility: Secondary | ICD-10-CM | POA: Insufficient documentation

## 2021-02-25 DIAGNOSIS — I4891 Unspecified atrial fibrillation: Secondary | ICD-10-CM | POA: Insufficient documentation

## 2021-02-25 DIAGNOSIS — R2681 Unsteadiness on feet: Secondary | ICD-10-CM | POA: Insufficient documentation

## 2021-02-25 DIAGNOSIS — R209 Unspecified disturbances of skin sensation: Secondary | ICD-10-CM | POA: Insufficient documentation

## 2021-02-25 LAB — PT/INR
PT INR: 3 — ABNORMAL HIGH (ref 0.9–1.1)
PT: 34.4 s — ABNORMAL HIGH (ref 10.1–12.9)
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
## 2021-03-02 ENCOUNTER — Ambulatory Visit
Admission: RE | Admit: 2021-03-02 | Discharge: 2021-03-02 | Disposition: A | Discharge status: Home / Self Care | Payer: Medicare Other | Source: Ambulatory Visit | Attending: Physician Assistant | Admitting: Physician Assistant

## 2021-03-02 DIAGNOSIS — F419 Anxiety disorder, unspecified: Secondary | ICD-10-CM | POA: Insufficient documentation

## 2021-03-02 NOTE — Progress Notes (Signed)
[facility]  Outpatient Neuro Rehabilitation Department  [address], [place] [address]  Phone: [phone]    Fax: [phone]                 NEUROPSYCHOLOGICAL PROGRESS NOTE      PATIENT: Jonathan [name] DOB: 10/25/1950   MRN: [id number]  AGE: 70 y.o.    Primary Language:English    Interpreter:          Referring Provider:         Date of Service: 03/02/2021       General Observations:    [name] appeared alert and oriented to person, place, and time.  [name] maintained eye contact and presented as cooperative and engaged throughout the course of the meeting.  Affect was appropriate for the situation.  Thought processes were logical and goal directed.  Expressive speech was fluent.  Conversational speech was intact.  There was no evidence of psychosis or delusions.  [name] explicitly denied SI and HI.    Intervention:  Health and behavior intervention    Symptoms:  Persistent anxiety, self-criticism, self-doubt  I provided active listening and cognitive reframing aimed at establishing a positive view of self.    Treatment Plan/Recommendations: Weekly behavioral intervention

## 2021-03-04 ENCOUNTER — Ambulatory Visit
Admission: RE | Admit: 2021-03-04 | Discharge: 2021-03-04 | Disposition: A | Discharge status: Home / Self Care | Payer: Medicare Other | Source: Ambulatory Visit | Attending: Specialist | Admitting: Specialist

## 2021-03-04 ENCOUNTER — Ambulatory Visit
Admission: RE | Admit: 2021-03-04 | Discharge: 2021-03-04 | Disposition: A | Discharge status: Home / Self Care | Payer: Medicare Other | Source: Ambulatory Visit | Attending: Physician Assistant | Admitting: Physician Assistant

## 2021-03-04 DIAGNOSIS — F419 Anxiety disorder, unspecified: Secondary | ICD-10-CM

## 2021-03-04 DIAGNOSIS — R4589 Other symptoms and signs involving emotional state: Secondary | ICD-10-CM | POA: Insufficient documentation

## 2021-03-04 DIAGNOSIS — I4891 Unspecified atrial fibrillation: Secondary | ICD-10-CM | POA: Insufficient documentation

## 2021-03-04 LAB — PT/INR
PT INR: 2 — ABNORMAL HIGH (ref 0.9–1.1)
PT: 22.8 s — ABNORMAL HIGH (ref 10.1–12.9)

## 2021-03-04 NOTE — Progress Notes (Signed)
[facility]  Outpatient Neuro Rehabilitation Department  [address], [place] [address]  Phone: [phone]    Fax: [phone]                 NEUROPSYCHOLOGICAL PROGRESS NOTE      PATIENT: KEELAN TRIPODI DOB: 09-Oct-1950   MRN: [id number]  AGE: 70 y.o.    Primary Language:English    Interpreter:          Referring Provider:         Date of Service: 03/04/2021       General Observations:    [name] appeared alert and oriented to person, place, and time.  [name] maintained eye contact and presented as cooperative and engaged throughout the course of the meeting.  Affect was appropriate for the situation.  Thought processes were logical and goal directed.  Expressive speech was fluent.  Conversational speech was intact.  There was no evidence of psychosis or delusions.  [name] explicitly denied SI and HI.    Intervention:  Health and behavior intervention    Symptoms:  sadness  I provided active listening and supportive statements aimed at enhancing coping skills    Treatment Plan/Recommendations: Weekly behavioral intervention

## 2021-03-09 ENCOUNTER — Ambulatory Visit
Admission: RE | Admit: 2021-03-09 | Discharge: 2021-03-09 | Disposition: A | Discharge status: Home / Self Care | Payer: Medicare Other | Source: Ambulatory Visit | Attending: Specialist | Admitting: Specialist

## 2021-03-09 ENCOUNTER — Ambulatory Visit
Admission: RE | Admit: 2021-03-09 | Discharge: 2021-03-09 | Disposition: A | Discharge status: Home / Self Care | Payer: Medicare Other | Source: Ambulatory Visit | Attending: Physician Assistant | Admitting: Physician Assistant

## 2021-03-09 DIAGNOSIS — F419 Anxiety disorder, unspecified: Secondary | ICD-10-CM | POA: Insufficient documentation

## 2021-03-09 DIAGNOSIS — R2681 Unsteadiness on feet: Secondary | ICD-10-CM | POA: Insufficient documentation

## 2021-03-09 DIAGNOSIS — R208 Other disturbances of skin sensation: Secondary | ICD-10-CM | POA: Insufficient documentation

## 2021-03-09 DIAGNOSIS — Z9181 History of falling: Secondary | ICD-10-CM | POA: Insufficient documentation

## 2021-03-09 DIAGNOSIS — R209 Unspecified disturbances of skin sensation: Secondary | ICD-10-CM

## 2021-03-09 NOTE — Progress Notes (Signed)
[facility]  Outpatient Neuro Rehabilitation Department  [address], [place] [address]  Phone: [phone]    Fax: [phone]     Physical Therapy - Treatment Note      PATIENT: Jonathan [name] DOB: 1950-08-06   MRN: [id number]  AGE: 70 y.o.    Primary Language: English  Interpreter: N/A - English is preferred language     Referring Provider:  Roland Rack, MD   CERTIFICATION DATES: 02/25/2021 - 05/27/21     Medical Diagnosis: G91.9 Hydrocephalus; R26.81 Gait instability   Therapy Diagnosis:   1. Personal history of fall        2. Unsteady gait        3. Disturbance of skin sensation              Date of Service: 03/09/21   Start Time: 1201   Stop Time: 1302   Time Calculation: 61 min   Visit #: 2     Rehabilitation Precautions/Restrictions:   Other Precautions: Falls, pacemaker    SUBJECTIVE     Patient Report: Patient with no new complaints.  Reports he has to get his suit pants taken in this week in preparation for his son's wedding in September.  Reports he feels a boat-like sensation when he is walking.     Patient/Caregiver Goals: 1. To improve my balance and my confidence with walking. 2. To not have to think so much about my balance or walking.      OBJECTIVE     General Observation: Patient received in lobby, ambulates to gym mod I with STC.  Slow, shuffling gait with forward head/head down posture.       Vitals:  121/83, HR 72 bpm at beginning of session.     Pain/ Re-Assessment: Reports he has been having pain in his head in region of shunt.  Can't recall if this is new onset or if he's been having this trouble since the surgery.  Has appointment with MD at end of the month.  Feels like pressure.  Currently 0/10, but sometimes gets to 3-4/10.       Interventions: Neuromuscular Re-Education and Therapeutic Exercise   TherEx:    Gastroc stretch:  wedge, wall.  2x30" each B.  Gave wall stretch as HEP.     NMRE:    Standing balance:       --- Semi-tandem: 3x30" head straight, then  + head turns x10 reps each BLE with up to min A in R stnace for stabilization.  Initial anxiety reported with static, reports increased confidence with practice.      --- Sit to/from stand:  attempted from standard height surface, but patient unable to perform safely.  Note decreased bilateral heel-down position, decreased anterior pelvic tilt to initiate weight transfer, increased BOS.  Able to perform x10 repetitions from standard height surface + Air-X pad with supervision/CGA and cues for positioning and control.       --- Sidestepping:  no UE support 20' x4 laps.  Decreased control noted to R>L with increasing instability noted with repetition.  Reports more difficulty/instability when he looks up.       --- Retrogait:  no UE support 91' x2 laps.  Cues for increasing BOS and step length, foot clearance.  Increasing unsteadiness noted with continued practice likely d/t fatigue.     Gait training:  no AD.  Cues to increase heel strike with improvement noted in overall gait pattern, but requires intermittent  cueing to continue.  Multiple bouts of varied distances up to 200'.      Long Term Goal: 10 visits from initial evaluation 02/25/21 Status:   LTG 1: Pt will improve Modified CTSIB score to 120/120 demosntrating improved standing balance and reducing risk of falls within his home and community. Addressed   LTG 2: Pt will be independent and compliant with home walking and balance exercise program to reduce falls risk within the home. Addressed   LTG 3: Pt will complete floor transfers independently for fall recovery. Not addressed          Education  Audience: Patient  Topics:  Gait, balance, home exercise program, goals of physical therapy.     Mode of Education Provided:  Explanation and Video   Pt refuses handout at this time to "save a tree".   Learner Response:  Verbalized understanding  Further Teaching Required:  Needs practice and Needs reinforcement      ASSESSMENT     Focus of session on initiation of  standing balance and gait activities for increased upright tolerance and standing balance.  Patient with increased proprioceptive  and visual reliance noted with activities, but reports decrease in anxiety and unsteadiness with practice.  Decreased gastroc/soleus ROM noted with sit to/from stand activity, and patient tolerates initiation of gastroc stretch for home program.Patient will benefit from continued skilled outpatient Physical Therapy services to address balance, gait and activity tolerance, in order to increase functional independence and safety with mobility in home and community.       PLAN/RECOMMENDATIONS      Treatment Frequency/Duration: Continue Outpatient Physical Therapy to achieve goals per previously established plan of care.   PT Frequency Recommended: 2x/week for 10 visits  DME Recommended for Discharge: Patient already has needed equipment  Recommended Consults: None.    Equipment Recommended: Patient already has needed equipment    Recommended Consults: None    Development of Plan of Care: No change to plan of care today. Patient and/or family continue to be in agreement with plan of care.     The patient/family has been instructed to contact the clinic if any questions or problems should arise.    Signature: Merrilyn Puma PT, DPT, MSCS

## 2021-03-09 NOTE — Progress Notes (Signed)
[facility]  Outpatient Neuro Rehabilitation Department  [address], [place] [address]  Phone: [phone]    Fax: [phone]                 NEUROPSYCHOLOGICAL PROGRESS NOTE      PATIENT: Jonathan [name] DOB: 1950-09-21   MRN: [id number]  AGE: 71 y.o.    Primary Language:English    Interpreter:          Referring Provider:         Date of Service: 03/09/2021       General Observations:    Jonathan [name] appeared alert and oriented to person, place, and time.  Jonathan [name] maintained eye contact and presented as cooperative and engaged throughout the course of the meeting.  Affect was appropriate for the situation.  Thought processes were logical and goal directed.  Expressive speech was fluent.  Conversational speech was intact.  There was no evidence of psychosis or delusions.  Jonathan [name] explicitly denied SI and HI.    Intervention:  Health and behavior intervention    Symptoms:  sadness  I provided cognitive reframing aimed at enhancing coping skills.    Treatment Plan/Recommendations: Weekly behavioral intervention

## 2021-03-11 ENCOUNTER — Ambulatory Visit
Admission: RE | Admit: 2021-03-11 | Discharge: 2021-03-11 | Disposition: A | Discharge status: Home / Self Care | Payer: Medicare Other | Source: Ambulatory Visit | Attending: Specialist | Admitting: Specialist

## 2021-03-11 DIAGNOSIS — S069X1D Unspecified intracranial injury with loss of consciousness of 30 minutes or less, subsequent encounter: Secondary | ICD-10-CM

## 2021-03-11 DIAGNOSIS — R209 Unspecified disturbances of skin sensation: Secondary | ICD-10-CM | POA: Insufficient documentation

## 2021-03-11 DIAGNOSIS — R2681 Unsteadiness on feet: Secondary | ICD-10-CM | POA: Insufficient documentation

## 2021-03-11 DIAGNOSIS — R278 Other lack of coordination: Secondary | ICD-10-CM

## 2021-03-11 DIAGNOSIS — Z9181 History of falling: Secondary | ICD-10-CM | POA: Insufficient documentation

## 2021-03-11 DIAGNOSIS — I4891 Unspecified atrial fibrillation: Secondary | ICD-10-CM | POA: Insufficient documentation

## 2021-03-11 LAB — PT/INR
PT INR: 2.2 — ABNORMAL HIGH (ref 0.9–1.1)
PT: 25.1 s — ABNORMAL HIGH (ref 10.1–12.9)

## 2021-03-11 NOTE — PT Progress Note (Signed)
[facility]  Outpatient Neuro Rehabilitation Department  [address], [place] [address]  Phone: [phone]    Fax: [phone]     Physical Therapy - Treatment Note      PATIENT: Jonathan [name] DOB: April 09, 1951   MRN: [id number]  AGE: 70 y.o.    Primary Language: English  Interpreter: N/A - English is preferred language     Referring Provider:  Roland Rack, MD   CERTIFICATION DATES: 02/25/2021 - 05/27/21        Medical Diagnosis: G91.9 Hydrocephalus; R26.81 Gait instability   Therapy Diagnosis:   1. Personal history of fall        2. Unsteady gait        3. Disturbance of skin sensation        4. Traumatic brain injury, with loss of consciousness of 30 minutes or less, subsequent encounter        5. Abnormal coordination              Date of Service: 03/11/21   Start Time: 1403   Stop Time: 1500   Time Calculation: 57 min   Visit #: 3     Rehabilitation Precautions/Restrictions:   Other Precautions: Falls, pacemaker    SUBJECTIVE     Patient Report: Describes feeling of unsteadiness similar to when you get off a boat and continue to feel the rocking sensation.     Patient/Caregiver Goals: 1. To improve my balance and my confidence with walking. 2. To not have to think so much about my balance or walking.      OBJECTIVE     General Observation: Patient received in lobby, ambulates to gym mod I with STC.  Slow, shuffling gait with forward head/head down posture.       Pain/ Re-Assessment: Mild headache; sinus pain. Unchanged throughout session.       Interventions: Neuromuscular Re-Education    Sensory Organization Test:   Condition Normal Trials Below Normal Trials Fall   1 3 0 0   2 3 0 0   3 3 0 0   4 2 1  0   5 1 2  0   6 2 1  0   Composite Score 71     Percent Below Normal N/A                    Issued Perfect Balance protocol as part of standing balance HEP and initiation of vestibular re-training. Pt completed at foot position 2. Provided printed hand-out for continued compliance at  home. Recommend pt complete 1x/day.       Long Term Goal: 10 visits from initial evaluation 02/25/21 Status:   LTG 1: Pt will improve Modified CTSIB score to 120/120 demosntrating improved standing balance and reducing risk of falls within his home and community. Progressing    LTG 2: Pt will be independent and compliant with home walking and balance exercise program to reduce falls risk within the home. Progressing    LTG 3: Pt will complete floor transfers independently for fall recovery. Progressing        Education  Audience: Patient  Topics:  safety, balance, falls risk, HEP     Mode of Education Provided:  Explanation, Demonstration, and Programme researcher, broadcasting/film/video (eg., article, handout)    Learner Response:  Verbalized understanding  Further Teaching Required:  Needs practice and Needs reinforcement      ASSESSMENT     Formal balance assessment via Sensory Organization  Test revealed an overall composite score that was WNL for patient's age-matched peers. He demonstrated a preference for somatosensory and presented with a mild impairment in vestibular and visual function. Provided Perfect Balance protocol at foot position 2 (romberg stance) to initiate vestibular retraining. Patient will benefit from continued skilled outpatient Physical Therapy services to address imbalance, in order to improve safety and confidence with gait and mobility within his home.       PLAN/RECOMMENDATIONS      Treatment Frequency/Duration: Continue Outpatient Physical Therapy to achieve goals per previously established plan of care.   PT Frequency Recommended: 2x/week for 10 visits  DME Recommended for Discharge: Patient already has needed equipment  Recommended Consults: None.    Equipment Recommended: Patient already has needed equipment    Recommended Consults: None    Development of Plan of Care: No change to plan of care today. Patient and/or family continue to be in agreement with plan of care.     The patient/family has been instructed  to contact the clinic if any questions or problems should arise.    Signature: Bertis Ruddy, PT, DPT, NCS

## 2021-03-16 ENCOUNTER — Ambulatory Visit
Admission: RE | Admit: 2021-03-16 | Discharge: 2021-03-16 | Disposition: A | Discharge status: Home / Self Care | Payer: Medicare Other | Source: Ambulatory Visit | Attending: Specialist | Admitting: Specialist

## 2021-03-16 ENCOUNTER — Ambulatory Visit
Admission: RE | Admit: 2021-03-16 | Discharge: 2021-03-16 | Disposition: A | Discharge status: Home / Self Care | Payer: Medicare Other | Source: Ambulatory Visit | Attending: Physician Assistant | Admitting: Physician Assistant

## 2021-03-16 DIAGNOSIS — R2681 Unsteadiness on feet: Secondary | ICD-10-CM | POA: Insufficient documentation

## 2021-03-16 DIAGNOSIS — R278 Other lack of coordination: Secondary | ICD-10-CM

## 2021-03-16 DIAGNOSIS — F419 Anxiety disorder, unspecified: Secondary | ICD-10-CM

## 2021-03-16 DIAGNOSIS — S069X1D Unspecified intracranial injury with loss of consciousness of 30 minutes or less, subsequent encounter: Secondary | ICD-10-CM

## 2021-03-16 DIAGNOSIS — R208 Other disturbances of skin sensation: Secondary | ICD-10-CM | POA: Insufficient documentation

## 2021-03-16 DIAGNOSIS — Z9181 History of falling: Secondary | ICD-10-CM | POA: Insufficient documentation

## 2021-03-16 DIAGNOSIS — R209 Unspecified disturbances of skin sensation: Secondary | ICD-10-CM

## 2021-03-16 NOTE — Progress Notes (Signed)
[facility]  Outpatient Neuro Rehabilitation Department  [address] [place] [address]  Phone: [phone]    Fax: [phone]     Physical Therapy - Treatment Note      PATIENT: Jonathan [name] DOB: [birth year]   MRN: [id number]  AGE: 70 y.o.    Primary Language: English  Interpreter: N/A - English is preferred language     Referring Provider:   Roland Rack, MD   CERTIFICATION DATES: 02/25/2021 - 05/27/21     Medical Diagnosis: G91.9 Hydrocephalus; R26.81 Gait instability   Therapy Diagnosis:   1. Anxiety        2. Personal history of fall        3. Unsteady gait        4. Disturbance of skin sensation        5. Traumatic brain injury, with loss of consciousness of 30 minutes or less, subsequent encounter        6. Abnormal coordination              Date of Service: 03/16/21   Start Time: 1230   Stop Time: 1330   Time Calculation: 60 min   Visit #: 4     Rehabilitation Precautions/Restrictions:   Other Precautions: Falls, pacemaker    SUBJECTIVE     Patient Report: Has been compliant with balance HEP over the weekend. Takes "focus" to do it well. Otherwise, stayed inside all weekend.       Patient/Caregiver Goals: 1. To improve my balance and my confidence with walking. 2. To not have to think so much about my balance or walking.      OBJECTIVE     General Observation: Patient received in lobby, ambulates to gym mod I with STC.  Slow, shuffling gait with forward head/head down posture.       Vitals:  Seated BP: 126/83; pt requested repeat reading of BP: 117/78; HR: 61     Pain/ Re-Assessment: No pain reported during or after session.     Interventions: Neuromuscular Re-Education and Therapeutic Activity    NMRE:    Focus of session on improving standing and dynamic balance with focus on vestibular system:     All activities were completed with close supervision; noted 2 instances of moderate instability requiring UE support to stabilize; otherwise, pt able to self-correct all losses of  balance without assistance; noted posterior LOB which improved with verbal cuing for abdominal activation and increasing weight-bearing through great toes.    Standing balance:   Foam:   Feet apart: eyes open, eyes closed; vertical and horizontal head turns; 2x10 each direction  Romberg stance: eyes open, eyes closed; vertical and horizontal head turns; 2x10 each direction  Cone reaching requiring trunk rotation and reaching outside BOS; 30 reps     Dynamic balance:  Walking with ball toss to self: fwd walk x4 laps; retro walk x4 laps;   Walking with ball toss to therapist: fwd walk x4 laps      TA:    Stair negotiation in hospital lobby:   Negotiated 2x16 stairs; single HR on LUE when ascending on first attempt; step-to pattern when ascending and descending with mod I.   On second attempt, no handrail utilized; reciprocal pattern when ascending with independence; step-to pattern when descending with casual supervision (pt facing sideways due to depth of stair).        Long Term Goal: 10 visits from initial evaluation 02/25/21 Status:   LTG  1: Pt will improve Modified CTSIB score to 120/120 demosntrating improved standing balance and reducing risk of falls within his home and community. Progressing    LTG 2: Pt will be independent and compliant with home walking and balance exercise program to reduce falls risk within the home. Progressing    LTG 3: Pt will complete floor transfers independently for fall recovery. Progressing        Education  Audience: Patient  Topics:  safety, balance, HEP, falls risk, precautions, plan of care     Mode of Education Provided:  Explanation and Demonstration    Learner Response:  Verbalized understanding  Further Teaching Required:  Needs practice and Needs reinforcement      ASSESSMENT     Pt demonstrates good postural responses to mild losses of balance and is able to self-correct balance without physical assistance 90% of the time. With use of handrail, he is capable of  negotiating stairs at a mod I functional level. Plan to focus next session on dynamic balance, particularly making turns and ambulating on uneven surfaces without AD. Patient will benefit from continued skilled outpatient Physical Therapy services to address imbalance, in order to improve safety and confidence with gait and mobility within his home.       PLAN/RECOMMENDATIONS      Treatment Frequency/Duration: Continue Outpatient Physical Therapy to achieve goals per previously established plan of care.   PT Frequency Recommended: 2x/week for 10 visits  DME Recommended for Discharge: Patient already has needed equipment  Recommended Consults: None.    Equipment Recommended: Patient already has needed equipment    Recommended Consults: None    Development of Plan of Care: No change to plan of care today. Patient and/or family continue to be in agreement with plan of care.     The patient/family has been instructed to contact the clinic if any questions or problems should arise.    Signature: Bertis Ruddy, PT, DPT, NCS

## 2021-03-16 NOTE — Progress Notes (Signed)
[facility]  Outpatient Neuro Rehabilitation Department  [address], [place] [address]  Phone: [phone]    Fax: [phone]                 NEUROPSYCHOLOGICAL PROGRESS NOTE      PATIENT: Jonathan [name] DOB: [birth year]   MRN: [id number]  AGE: 70 y.o.    Primary Language:English    Interpreter:          Referring Provider:         Date of Service: 03/16/2021       General Observations:    [name] appeared alert and oriented to person, place, and time.  [name] maintained eye contact and presented as cooperative and engaged throughout the course of the meeting.  Affect was appropriate for the situation.  Thought processes were logical and goal directed.  Expressive speech was fluent.  Conversational speech was intact.  There was no evidence of psychosis or delusions.  [name] explicitly denied SI and HI.    Intervention:  Health and behavior intervention    Symptoms:  Anxiety  I provided active listening, supportive statements, and positive reframing aimed at enhancing coping skills.    Treatment Plan/Recommendations: Weekly behavioral intervention

## 2021-03-18 ENCOUNTER — Ambulatory Visit
Admission: RE | Admit: 2021-03-18 | Discharge: 2021-03-18 | Disposition: A | Discharge status: Home / Self Care | Payer: Medicare Other | Source: Ambulatory Visit | Attending: Physician Assistant | Admitting: Physician Assistant

## 2021-03-18 ENCOUNTER — Ambulatory Visit
Admission: RE | Admit: 2021-03-18 | Discharge: 2021-03-18 | Disposition: A | Discharge status: Home / Self Care | Payer: Medicare Other | Source: Ambulatory Visit | Attending: Specialist | Admitting: Specialist

## 2021-03-18 DIAGNOSIS — R2681 Unsteadiness on feet: Secondary | ICD-10-CM | POA: Insufficient documentation

## 2021-03-18 DIAGNOSIS — R209 Unspecified disturbances of skin sensation: Secondary | ICD-10-CM | POA: Insufficient documentation

## 2021-03-18 DIAGNOSIS — I4891 Unspecified atrial fibrillation: Secondary | ICD-10-CM | POA: Insufficient documentation

## 2021-03-18 DIAGNOSIS — R278 Other lack of coordination: Secondary | ICD-10-CM

## 2021-03-18 DIAGNOSIS — Z9181 History of falling: Secondary | ICD-10-CM | POA: Insufficient documentation

## 2021-03-18 DIAGNOSIS — R4589 Other symptoms and signs involving emotional state: Secondary | ICD-10-CM | POA: Insufficient documentation

## 2021-03-18 DIAGNOSIS — F419 Anxiety disorder, unspecified: Secondary | ICD-10-CM | POA: Insufficient documentation

## 2021-03-18 DIAGNOSIS — S069X1D Unspecified intracranial injury with loss of consciousness of 30 minutes or less, subsequent encounter: Secondary | ICD-10-CM

## 2021-03-18 LAB — PT/INR
PT INR: 3 — ABNORMAL HIGH (ref 0.9–1.1)
PT: 34.8 s — ABNORMAL HIGH (ref 10.1–12.9)

## 2021-03-18 NOTE — Progress Notes (Signed)
[facility]  Outpatient Neuro Rehabilitation Department  [address], [place] [address]  Phone: [phone]    Fax: [phone]                 NEUROPSYCHOLOGICAL PROGRESS NOTE      PATIENT: Jonathan [name] DOB: 04/12/1951   MRN: [id number]  AGE: 70 y.o.    Primary Language:English    Interpreter:          Referring Provider:         Date of Service: 03/18/2021       General Observations:    Jonathan [name] appeared alert and oriented to person, place, and time.  Jonathan [name] maintained eye contact and presented as cooperative and engaged throughout the course of the meeting.  Affect was appropriate for the situation.  Thought processes were logical and goal directed.  Expressive speech was fluent.  Conversational speech was intact.  There was no evidence of psychosis or delusions.  Jonathan [name] explicitly denied SI and HI.    Intervention:  Health and behavior intervention    Symptoms:  sadness and anxiety  I provided cognitive reframing aimed at enhancing coping skills.    Treatment Plan/Recommendations: Weekly  behavioral intervention

## 2021-03-18 NOTE — PT Progress Note (Signed)
[facility]  Outpatient Neuro Rehabilitation Department  [address], [place] [address]  Phone: [phone]    Fax: [phone]     Physical Therapy - Treatment Note      PATIENT: Jonathan [name] DOB: [birth year]   MRN: [id number]  AGE: 70 y.o.    Primary Language: English  Interpreter: N/A - English is preferred language     Referring Provider:   Roland Rack, MD   CERTIFICATION DATES: 02/25/2021 - 05/27/21     Medical Diagnosis: G91.9 Hydrocephalus; R26.81 Gait instability   Therapy Diagnosis:   1. Personal history of fall        2. Unsteady gait        3. Traumatic brain injury, with loss of consciousness of 30 minutes or less, subsequent encounter        4. Abnormal coordination              Date of Service: 03/18/21   Start Time: 1102   Stop Time: 1158   Time Calculation: 56 min   Visit #: 5     Rehabilitation Precautions/Restrictions:   Other Precautions: Falls, pacemaker    SUBJECTIVE     Patient Report:   Feeling more anxious this morning; took Xanax which took the edge off.   Never feels confident with walking; does not like that he has to think about his posture when walking. Walked 11,000 steps yesterday though did it all inside as he didn't want neighbors to think of him as an "old man" while walking outside.       Patient/Caregiver Goals: 1. To improve my balance and my confidence with walking. 2. To not have to think so much about my balance or walking.      OBJECTIVE     General Observation: Patient received in lobby, ambulates to gym mod I with STC.  Slow, shuffling gait with forward head/head down posture. Pt dons mask for duration of session due to COVID precautions. +glasses donned throughout.     Vitals:  Seated BP: 125.81; HR: 60    Pain/ Re-Assessment: Mild pressure in head; consistent throughout session.       Interventions: Neuromuscular Re-Education and Gait Training    Focus of session on improving standing/dynamic balance and gait training without AD. Entire  session completed without AD.     NMRE:   Standing balance:   Rockerboard with A/P weight-shifting: 10 minutes total with static stance, controlled weight-shifting, standing with eyes closed, and mini squats completed without UE support; 2 instances of LOB requiring UE support and assistance from therapist noted during posterior LOB when standing with eyes closed; improved with verbal/tactile cuing from therapist for appropriate compensation.     Dynamic balance:   Fwd walk over orange hurdles: 50 reps; knocked over hurdle on 1 occasion; no LOB  Sidestepping over orange hurdles: 50 reps; knocked over hurdle on 2 occassions; no LOB  Cone weaving and making 180 degree turns: 50 reps; mild instability due to ambulation with narrow BOS when turning though able to complete without physical assistance   Walking with ball toss to self: fwd walk x4 laps; retro walk x4 laps    Gait Training:   8 minutes outdoors on uneven surfaces, walking on brick pathways, up/down curb steps, and on side-walks; emphasis on upright posture, arm swing, and increasing step length bilaterally; no LOB noted. Pt reports to lack of confidence outdoors and frustration with cognitive component of being required to "  think about walking."   Pt requesting to jog indoors and completed 4 laps in therapy gym without LOB; pt reporting to improved confidence with jogging vs. Walking.       Long Term Goal: 10 visits from initial evaluation 02/25/21 Status:   LTG 1: Pt will improve Modified CTSIB score to 120/120 demosntrating improved standing balance and reducing risk of falls within his home and community. Progressing    LTG 2: Pt will be independent and compliant with home walking and balance exercise program to reduce falls risk within the home. Progressing    LTG 3: Pt will complete floor transfers independently for fall recovery. Progressing          Education  Audience: Patient  Topics:  precautions, plan of care, safety, gait     Mode of Education  Provided:  Explanation and Demonstration    Learner Response:  Verbalized understanding  Further Teaching Required:  Needs practice and Needs reinforcement      ASSESSMENT     Pt's physical ability is much greater than his confidence with physical activities. For instance, pt requesting to try jogging this session and reported to feeling more confident with jogging vs. Walking. No LOB or staggering noted when walking x8 minutes outdoors without AD though community ambulation is limited due to pt's concern with neighbors/friends perception of his gait. Plan for 5 additional outpatient PT sessions to improve confidence with balance and gait without AD. Patient will benefit from continued skilled outpatient Physical Therapy services to address imbalance, in order to improve safety and confidence with gait and mobility within his home.       PLAN/RECOMMENDATIONS      Treatment Frequency/Duration: Continue Outpatient Physical Therapy to achieve goals per previously established plan of care.   PT Frequency Recommended: 2x/week for 10 visits  DME Recommended for Discharge: Patient already has needed equipment  Recommended Consults: None.    Equipment Recommended: Patient already has needed equipment    Recommended Consults: None    Development of Plan of Care: No change to plan of care today. Patient and/or family continue to be in agreement with plan of care.     The patient/family has been instructed to contact the clinic if any questions or problems should arise.    Signature: Bertis Ruddy, PT, DPT, NCS

## 2021-03-23 ENCOUNTER — Ambulatory Visit: Payer: Self-pay

## 2021-03-25 ENCOUNTER — Ambulatory Visit: Payer: Self-pay

## 2021-03-30 ENCOUNTER — Ambulatory Visit: Payer: Self-pay

## 2021-04-01 ENCOUNTER — Ambulatory Visit: Payer: Self-pay

## 2021-04-06 ENCOUNTER — Ambulatory Visit
Admission: RE | Admit: 2021-04-06 | Discharge: 2021-04-06 | Disposition: A | Discharge status: Home / Self Care | Payer: Medicare Other | Source: Ambulatory Visit | Attending: Specialist | Admitting: Specialist

## 2021-04-06 ENCOUNTER — Inpatient Hospital Stay
Admission: RE | Admit: 2021-04-06 | Discharge: 2021-04-06 | Disposition: A | Discharge status: Home / Self Care | Payer: Medicare Other | Source: Ambulatory Visit | Attending: Specialist | Admitting: Specialist

## 2021-04-06 DIAGNOSIS — Z9181 History of falling: Secondary | ICD-10-CM | POA: Insufficient documentation

## 2021-04-06 DIAGNOSIS — I4891 Unspecified atrial fibrillation: Secondary | ICD-10-CM | POA: Insufficient documentation

## 2021-04-06 DIAGNOSIS — R278 Other lack of coordination: Secondary | ICD-10-CM | POA: Insufficient documentation

## 2021-04-06 DIAGNOSIS — R2681 Unsteadiness on feet: Secondary | ICD-10-CM | POA: Insufficient documentation

## 2021-04-06 DIAGNOSIS — S069X1D Unspecified intracranial injury with loss of consciousness of 30 minutes or less, subsequent encounter: Secondary | ICD-10-CM | POA: Insufficient documentation

## 2021-04-06 DIAGNOSIS — X58XXXD Exposure to other specified factors, subsequent encounter: Secondary | ICD-10-CM | POA: Insufficient documentation

## 2021-04-06 DIAGNOSIS — R209 Unspecified disturbances of skin sensation: Secondary | ICD-10-CM | POA: Insufficient documentation

## 2021-04-06 DIAGNOSIS — F4323 Adjustment disorder with mixed anxiety and depressed mood: Secondary | ICD-10-CM

## 2021-04-06 DIAGNOSIS — R2689 Other abnormalities of gait and mobility: Secondary | ICD-10-CM | POA: Insufficient documentation

## 2021-04-06 LAB — PT/INR
PT INR: 3.8 — ABNORMAL HIGH (ref 0.9–1.1)
PT: 43.9 s — ABNORMAL HIGH (ref 10.1–12.9)

## 2021-04-06 NOTE — Progress Notes (Signed)
[facility]  Outpatient Neuro Rehabilitation Department  [address] [place] [address]  Phone: [phone]    Fax: [phone]     Physical Therapy - Treatment Note      PATIENT: Jonathan [name] DOB: 11/27/1950   MRN: [id number]  AGE: 70 y.o.    Primary Language: English  Interpreter: N/A - English is preferred language     Referring Provider:   Roland Rack, MD   CERTIFICATION DATES: 02/25/2021 - 05/27/21     Medical Diagnosis: G91.9 Hydrocephalus; R26.81 Gait instability   Therapy Diagnosis:   1. Personal history of fall        2. Unsteady gait        3. Abnormal coordination        4. Traumatic brain injury, with loss of consciousness of 30 minutes or less, subsequent encounter              Date of Service: 04/06/21   Start Time: 1105   Stop Time: 1159   Time Calculation: 54 min   Visit #: 6     Rehabilitation Precautions/Restrictions:   Other Precautions: Falls, pacemaker    SUBJECTIVE     Patient Report: Patient reports he hasn't been here because he had COVID.  Reports his last positive test was about a week ago, and he has had 2 negatives since.  Is still feeling a little tired, still has a bit of a cough.  Is going to get his blood tested after this.      Patient/Caregiver Goals: 1. To improve my balance and my confidence with walking. 2. To not have to think so much about my balance or walking.      OBJECTIVE     General Observation: Patient received in lobby, ambulates to gym mod I with STC.  Slow, shuffling gait with forward head/head down posture. Pt dons mask for duration of session due to COVID precautions. +glasses donned throughout.      Vitals:  86/61 at beginning of session, HR 60 bpm.  Pt denies lightheadedness. Following gait training BP 96/66, HR 59 bpm.  O2 saturation 99%.  O2 spotchecked throughout session via finger oximeter:  >95%.     Pain/ Re-Assessment: Patietn denies pain.       Interventions: Neuromuscular Re-Education    Focus of session on improving  standing/dynamic balance and gait training without AD. Entire session completed without AD.      Sit to/from stand:  x10 reps with cues for foot placement, control, hip hinge.  Initially performs with heels off floor, with LOB noted posteriorly. Improves with cueing.     Alternate step taps:  x20 reps to 6" step no Ue support.  1-2 posterior LOB requiring min A to correct.     Static standing:  semi-tandem.  2x30" B no UE support, cues for elevated gaze and posture.     Step clock:  5 point no UE support x5 reps B.  Close supervision with occasional imbalance noted in L>R stance.     FWD/BWD tandem gait:  no UE support with increased time, CGA to min A for balance.  20' x2 laps.   SS gait:  no UE support with cues for postuer and alignment.  50' x2 laps.      Gait training: up to 400' with close supervision to occasional CGA d/t lateral unsteadiness to R>L.  Frequent cues to increase step length, kupright posture.  No AD.  BP/O2 sats  following longer bout as noted above.     Rest breaks provided throughout session d/t fatigue and dyspnea noted; patient reporting h/a with sit to stand and standing activities which decreases with seated rest break.          Long Term Goal: 10 visits from initial evaluation 02/25/21 Status:   LTG 1: Pt will improve Modified CTSIB score to 120/120 demosntrating improved standing balance and reducing risk of falls within his home and community. Addressed   LTG 2: Pt will be independent and compliant with home walking and balance exercise program to reduce falls risk within the home. Addressed   LTG 3: Pt will complete floor transfers independently for fall recovery. Not addressed          Education  Audience: Patient  Topics:  Safety, home exercise, balance, posture     Mode of Education Provided:  Explanation and Demonstration    Learner Response:  Verbalized understanding  Further Teaching Required:  Needs practice and Needs reinforcement      ASSESSMENT     Patient participates in  standing balance and gait activities this date with overall tolerance of session limited by fatigue and dyspnea most likely related to recent COVID infection.  Vitals spotchecked thorughout session, BP improves with activity but remains low.  Note decreased R>L ankle DF motor activation and ROM with posterior LOB with sit to stand and dynamic standing activities. Patient will benefit from continued skilled outpatient Physical Therapy services to address impairments and functional limitations, in order to increase safety and independence with mobility.       PLAN/RECOMMENDATIONS      Treatment Frequency/Duration: Continue Outpatient Physical Therapy to achieve goals per previously established plan of care.   PT Frequency Recommended: 2x/week for 10 visits  DME Recommended for Discharge: Patient already has needed equipment  Recommended Consults: None.    Equipment Recommended: Patient already has needed equipment    Recommended Consults: None    Development of Plan of Care: No change to plan of care today. Patient and/or family continue to be in agreement with plan of care.     The patient/family has been instructed to contact the clinic if any questions or problems should arise.    Signature: Merrilyn Puma PT, DPT, MSCS

## 2021-04-06 NOTE — Progress Notes (Signed)
[facility]  Outpatient Neuro Rehabilitation Department  [address], [place] [address]  Phone: [phone]    Fax: [phone]                 NEUROPSYCHOLOGICAL PROGRESS NOTE      PATIENT: Jonathan [name] DOB: 07-29-1951   MRN: [id number]  AGE: 70 y.o.    Primary Language:English    Interpreter:          Referring Provider:         Date of Service: 04/06/2021       General Observations:    [name] appeared alert and oriented to person, place, and time.  [name] maintained eye contact and presented as cooperative and engaged throughout the course of the meeting.  Affect was appropriate for the situation.  Thought processes were logical and goal directed.  Expressive speech was fluent.  Conversational speech was intact.  There was no evidence of psychosis or delusions.  [name] explicitly denied SI and HI.    Intervention:  Health and behavior intervention    Symptoms:  sadness and Anxiety  I provided cognitive reframing aimed at increasing reality testing and coping    Treatment Plan/Recommendations: Weekly behavioral intervention

## 2021-04-08 ENCOUNTER — Ambulatory Visit: Payer: Self-pay

## 2021-04-08 ENCOUNTER — Ambulatory Visit
Admission: RE | Admit: 2021-04-08 | Discharge: 2021-04-08 | Disposition: A | Discharge status: Home / Self Care | Payer: Medicare Other | Source: Ambulatory Visit

## 2021-04-08 ENCOUNTER — Ambulatory Visit
Admission: RE | Admit: 2021-04-08 | Discharge: 2021-04-08 | Disposition: A | Discharge status: Home / Self Care | Payer: Medicare Other | Source: Ambulatory Visit | Attending: Specialist | Admitting: Specialist

## 2021-04-08 DIAGNOSIS — G919 Hydrocephalus, unspecified: Secondary | ICD-10-CM | POA: Insufficient documentation

## 2021-04-08 DIAGNOSIS — R4589 Other symptoms and signs involving emotional state: Secondary | ICD-10-CM | POA: Insufficient documentation

## 2021-04-08 DIAGNOSIS — R278 Other lack of coordination: Secondary | ICD-10-CM | POA: Insufficient documentation

## 2021-04-08 DIAGNOSIS — F419 Anxiety disorder, unspecified: Secondary | ICD-10-CM

## 2021-04-08 DIAGNOSIS — R2689 Other abnormalities of gait and mobility: Secondary | ICD-10-CM | POA: Insufficient documentation

## 2021-04-08 DIAGNOSIS — R2681 Unsteadiness on feet: Secondary | ICD-10-CM | POA: Insufficient documentation

## 2021-04-08 DIAGNOSIS — S069X1D Unspecified intracranial injury with loss of consciousness of 30 minutes or less, subsequent encounter: Secondary | ICD-10-CM

## 2021-04-08 DIAGNOSIS — Z9181 History of falling: Secondary | ICD-10-CM | POA: Insufficient documentation

## 2021-04-08 NOTE — PT Progress Note (Signed)
[facility]  Outpatient Neuro Rehabilitation Department  [address], [place] [address]  Phone: [phone]    Fax: [phone]     Physical Therapy - Treatment Note      PATIENT: Jonathan [name] DOB: [birth year]   MRN: [id number]  AGE: 70 y.o.    Primary Language: English  Interpreter: N/A - English is preferred language     Referring Provider:   Roland Rack, MD   CERTIFICATION DATES: 02/25/2021 - 05/27/21     Medical Diagnosis: G91.9 Hydrocephalus; R26.81 Gait instability   Therapy Diagnosis:   1. Personal history of fall        2. Unsteady gait        3. Abnormal coordination        4. Traumatic brain injury, with loss of consciousness of 30 minutes or less, subsequent encounter              Date of Service: 04/08/21   Start Time: 1103   Stop Time: 1200   Time Calculation: 57 min   Visit #: 7     Rehabilitation Precautions/Restrictions:   Other Precautions: Falls, pacemaker    SUBJECTIVE     Patient Report: Patient reports his son is getting married next weekend, so thinks he has some preparations that are going to be going on with that.  Just got his hair cut this morning.  Reports his blood was a little thin with his labs the other day, so is off the Warfarin for a bit.  Reports he has been given a home exercise program but has lost it and hasn't been doing anything.  Has been trying to do some walking and get his steps in until COVID, has been too tired.      Patient/Caregiver Goals: 1. To improve my balance and my confidence with walking. 2. To not have to think so much about my balance or walking.      OBJECTIVE     General Observation: Patient received in lobby, ambulates to gym mod I with STC.  Slow, shuffling gait with forward head/head down posture. Pt dons mask for duration of session due to COVID precautions. +glasses donned throughout.      Vitals:  108/73, HR 63 bpm.    Pain/ Re-Assessment: Patient denies pain.       Interventions: Neuromuscular Re-Education and Therapeutic  Activity    Focus of session on review and finalization of HEP for increased self-management of impairments:      TA:      -- Supine single knee to chest x30 sec B, active hamstring stretch 10 x10" B, piriformis stretch x30 sec B.       -- Wall stretch:  2x30 sec B.       -- sit to/from stand:  3x10 reps from standard height surface no UE support. Initial min A/CGA at hips for safety progressing to supervision.  Cues for hip hinge, foot placement, control.      NMRE:      -- Tandem standing:  head straight x30 sec B.  Attempted with head turns, but patient unable to perform safely.       -- Semi-tandem standing:  + head turns x10 repetitions bilaterally with single finger support.  Cues for posture/alignment.       --- FWD/BWD gait:  50' x3 laps.  Cues with FWD gait for heel strike and upright/elevated gaze with improvement in overall gait pattern noted; cues with retro gait  for step length and control.  Note initial posterior LOB d/t decreased terminal hip extension and ankle DF following retrogait.      --- SS gait:  50' x1 lap B.  Cues for alignment/decreased rotation with L>R stepping.        Long Term Goal: 10 visits from initial evaluation 02/25/21 Status:   LTG 1: Pt will improve Modified CTSIB score to 120/120 demosntrating improved standing balance and reducing risk of falls within his home and community. Addressed   LTG 2: Pt will be independent and compliant with home walking and balance exercise program to reduce falls risk within the home. Addressed   LTG 3: Pt will complete floor transfers independently for fall recovery. Not addressed          Education  Audience: Patient  Topics:  Home exercise, safety, balance, gait, tone management, posture     Mode of Education Provided:  Explanation, Demonstration, and Programme researcher, broadcasting/film/video (eg., article, handout)    Learner Response:  Verbalized understanding  Further Teaching Required:  Needs practice and Needs reinforcement      ASSESSMENT     Focus of session on  review and finalization of HEP for improved self-management of impairments.  Patient with noted decreased functional hip extension ROM and ankle DF ROM with standing and gait, initiated stretching routine and discussed relevance to impairments.  Demonstrates posterior LOB in standing especially following retro gait, with noted decreased terminal hip extension and ankle DF with decreased/delayed initiation of balance reaction.  Seated rest breaks provided throughout session to manage fatigue, but improved activity tolerance noted compared to previous visit.Patient will benefit from continued skilled outpatient Physical Therapy services to address impairments and functional limitations, in order to increase safety and independence with mobility.       PLAN/RECOMMENDATIONS      Treatment Frequency/Duration: Continue Outpatient Physical Therapy to achieve goals per previously established plan of care.   PT Frequency Recommended: 2x/week for 10 visits  DME Recommended for Discharge: Patient already has needed equipment  Recommended Consults: None.    Equipment Recommended: Patient already has needed equipment    Recommended Consults: None    Development of Plan of Care: No change to plan of care today. Patient and/or family continue to be in agreement with plan of care.     The patient/family has been instructed to contact the clinic if any questions or problems should arise.    Signature: Merrilyn Puma PT, DPT, MSCS

## 2021-04-08 NOTE — Progress Notes (Signed)
[facility]  Outpatient Neuro Rehabilitation Department  [address] [place] [address]  Phone: [phone]    Fax: [phone]                 NEUROPSYCHOLOGICAL PROGRESS NOTE      PATIENT: Jonathan [name] DOB: [birth year]   MRN: [id number]  AGE: 70 y.o.    Primary Language:English    Interpreter:          Referring Provider:         Date of Service: 04/08/2021       General Observations:    [name] appeared alert and oriented to person, place, and time.  [name] maintained eye contact and presented as cooperative and engaged throughout the course of the meeting.  Affect was appropriate for the situation.  Thought processes were logical and goal directed.  Expressive speech was fluent.  Conversational speech was intact.  There was no evidence of psychosis or delusions.  [name] explicitly denied SI and HI.    Intervention:  Health and behavior intervention    Symptoms:  sadness and anxiety  I provided cognitive reframing aimed at enhancing coping skills.    Treatment Plan/Recommendations: Weekly behavioral intervention

## 2021-04-13 ENCOUNTER — Ambulatory Visit
Admission: RE | Admit: 2021-04-13 | Discharge: 2021-04-13 | Disposition: A | Discharge status: Home / Self Care | Payer: Medicare Other | Source: Ambulatory Visit

## 2021-04-13 DIAGNOSIS — F419 Anxiety disorder, unspecified: Secondary | ICD-10-CM | POA: Insufficient documentation

## 2021-04-13 DIAGNOSIS — F4321 Adjustment disorder with depressed mood: Secondary | ICD-10-CM

## 2021-04-13 NOTE — Progress Notes (Signed)
[facility]  Outpatient Neuro Rehabilitation Department  [address] [place] [address]  Phone: [phone]    Fax: [phone]                 NEUROPSYCHOLOGICAL PROGRESS NOTE      PATIENT: Jonathan [name] DOB: [birth year]   MRN: [id number]  AGE: 70 y.o.    Primary Language:English    Interpreter:          Referring Provider:         Date of Service: 04/13/2021       General Observations:    Jonathan [name] appeared alert and oriented to person, place, and time.  Jonathan [name] maintained eye contact and presented as cooperative and engaged throughout the course of the meeting.  Affect was appropriate for the situation.  Thought processes were logical and goal directed.  Expressive speech was fluent.  Conversational speech was intact.  There was no evidence of psychosis or delusions.  Jonathan [name] explicitly denied SI and HI.    Intervention:  Health and behavior intervention    Symptoms:  Sadness and anxiety  I provided cognitive reframing and clarificaitons aimed at enhancing coping skills.    Treatment Plan/Recommendations: Weekly behavioral intervention

## 2021-04-15 ENCOUNTER — Ambulatory Visit
Admission: RE | Admit: 2021-04-15 | Discharge: 2021-04-15 | Disposition: A | Discharge status: Home / Self Care | Payer: Medicare Other | Source: Ambulatory Visit | Attending: Specialist | Admitting: Specialist

## 2021-04-15 ENCOUNTER — Inpatient Hospital Stay
Admission: RE | Admit: 2021-04-15 | Discharge: 2021-04-15 | Disposition: A | Discharge status: Home / Self Care | Payer: Medicare Other | Source: Ambulatory Visit

## 2021-04-15 ENCOUNTER — Ambulatory Visit
Admission: RE | Admit: 2021-04-15 | Discharge: 2021-04-15 | Disposition: A | Discharge status: Home / Self Care | Payer: Medicare Other | Source: Ambulatory Visit

## 2021-04-15 DIAGNOSIS — F4321 Adjustment disorder with depressed mood: Secondary | ICD-10-CM | POA: Insufficient documentation

## 2021-04-15 DIAGNOSIS — S069X1D Unspecified intracranial injury with loss of consciousness of 30 minutes or less, subsequent encounter: Secondary | ICD-10-CM

## 2021-04-15 DIAGNOSIS — Z9181 History of falling: Secondary | ICD-10-CM | POA: Insufficient documentation

## 2021-04-15 DIAGNOSIS — R278 Other lack of coordination: Secondary | ICD-10-CM

## 2021-04-15 DIAGNOSIS — R2689 Other abnormalities of gait and mobility: Secondary | ICD-10-CM | POA: Insufficient documentation

## 2021-04-15 DIAGNOSIS — R2681 Unsteadiness on feet: Secondary | ICD-10-CM | POA: Insufficient documentation

## 2021-04-15 DIAGNOSIS — R209 Unspecified disturbances of skin sensation: Secondary | ICD-10-CM | POA: Insufficient documentation

## 2021-04-15 LAB — PT/INR
PT INR: 2.8 — ABNORMAL HIGH (ref 0.9–1.1)
PT: 32.1 s — ABNORMAL HIGH (ref 10.1–12.9)

## 2021-04-15 NOTE — Progress Notes (Signed)
[facility]  Outpatient Neuro Rehabilitation Department  [address], [place] [address]  Phone: [phone]    Fax: [phone]     Physical Therapy - Treatment Note      PATIENT: Jonathan [name] DOB: 12-22-1950   MRN: [id number]  AGE: 70 y.o.    Primary Language: English  Interpreter: N/A - English is preferred language     Referring Provider:   Roland Rack, MD   CERTIFICATION DATES: 02/25/2021 - 05/27/21     Medical Diagnosis: G91.9 Hydrocephalus; R26.81 Gait instability   Therapy Diagnosis:   1. Personal history of fall        2. Unsteady gait        3. Abnormal coordination        4. Traumatic brain injury, with loss of consciousness of 30 minutes or less, subsequent encounter              Date of Service: 04/15/21   Start Time: 1103   Stop Time: 1158   Time Calculation: 55 min   Visit #: 8     Rehabilitation Precautions/Restrictions:   Other Precautions: Falls, pacemaker    SUBJECTIVE     Patient Report: Patient reports his son's wedding is tomorrow so will be busy.  Opened the email sent with his exercise program, but did not do his exercises this past week.  Reports he walks like an old man when he gets tired, feels like he shuffles; has had instances where he's gotten really tired and had to have someone drive him home, and thsi makes him not want to walk outside.  Was able to walk long steps for 200 laps of his hallway yesterday, took him about an hour to do 8000 steps.      Patient/Caregiver Goals: 1. To improve my balance and my confidence with walking. 2. To not have to think so much about my balance or walking.      OBJECTIVE     General Observation: Patient received in lobby, ambulates to gym mod I with STC.  Slow, shuffling gait with forward head/head down posture. Pt dons mask for duration of session due to COVID precautions. +glasses donned throughout.       Vitals:  93/63, HR 64 bpm    Pain/ Re-Assessment: Patient denies pain throughout session.       Interventions:  Neuromuscular Re-Education and Gait Training    Focus of session initially on functional ROM and proximal strengthening activities for increased function with standing and gait:       --- Prone lying:  x1 min static     --- Prone lying + knee flexion:  x10 reps B     --- Prone on elbows:  x2 mins.       --- Tall kneel <> kneel:  x20 reps no UE support.      --- Split kneeling:  x1 min B with min A in R stance, mod to max A in L stance.  UE support required thorughout.        --- Floor to/from sitting transfer:  using UE support on stationary surface with supervision.     Gait training:  focus on increasing step length and heel strike, arm swing, upright posture/elevated gaze.  Completes x500' with modeling and verbal cueing and supervision.     :  Per pt's request, completed for patient's understanding of distance completed in walking at home.  Completes 1300\' 3"  with frequent cues  to increase L>R step length and heel strike, upright gaze, arm swing.  No LOB, good safety throughout.      Education provided regarding safe initiation of walking program; discussed starting with outdoor walking program of half the number of steps taken yesterday (4000 steps), assessing and taking note of response, and then gradually increasing as able.  Discussed plan of care and upcoming progress note, advised patient to be thinking about goals and needs and readiness for discharge.        Long Term Goal: 10 visits from initial evaluation 02/25/21 Status:   LTG 1: Pt will improve Modified CTSIB score to 120/120 demosntrating improved standing balance and reducing risk of falls within his home and community. Addressed   LTG 2: Pt will be independent and compliant with home walking and balance exercise program to reduce falls risk within the home. Addressed   LTG 3: Pt will complete floor transfers independently for fall recovery. Addressed         Education  Audience: Patient  Topics:  Plan of care, gait training, home  activities, safety     Mode of Education Provided:  Explanation and Demonstration    Learner Response:  Verbalized understanding  Further Teaching Required:  Needs reinforcement      ASSESSMENT     Patient participates in activities in prone and kneeling for increased trunk and hip extension AROM for increased postural control with standing and gait.  Requires significant assist due to instability in L>R split kneeling, with facilitation provided at L glute and trunk for activation and upright posture.  Demonstrates good improvement in gait mechanics with modeling during gait, and with verbal  cueing to increase step length and heel strike; requires cues outside of focused gait practice to maintain corrections especially with distraction.Patient will benefit from continued skilled outpatient Physical Therapy services to address impairments and functional limitations, in order to increase independence and safety with mobility. Marland Kitchen       PLAN/RECOMMENDATIONS      Treatment Frequency/Duration: Continue Outpatient Physical Therapy to achieve goals per previously established plan of care.   PT Frequency Recommended: 2x/week for 10 visits  DME Recommended for Discharge: Patient already has needed equipment.    Equipment Recommended: Patient already has needed equipment    Recommended Consults:      Development of Plan of Care: No change to plan of care today. Patient and/or family continue to be in agreement with plan of care.     The patient/family has been instructed to contact the clinic if any questions or problems should arise.    Signature: Merrilyn Puma PT, DPT, MSCS

## 2021-04-15 NOTE — Progress Notes (Signed)
[facility]  Outpatient Neuro Rehabilitation Department  [address], [place] [address]  Phone: [phone]    Fax: [phone]                 NEUROPSYCHOLOGICAL PROGRESS NOTE      PATIENT: Jonathan [name] DOB: 09-22-1950   MRN: [id number]  AGE: 70 y.o.    Primary Language:English    Interpreter:          Referring Provider:         Date of Service: 04/15/2021       General Observations:    Jonathan [name] appeared alert and oriented to person, place, and time.  Jonathan [name] maintained eye contact and presented as cooperative and engaged throughout the course of the meeting.  Affect was appropriate for the situation.  Thought processes were logical and goal directed.  Expressive speech was fluent.  Conversational speech was intact.  There was no evidence of psychosis or delusions.  Jonathan [name] explicitly denied SI and HI.    Intervention:  Health and behavior intervention    Symptoms:  sadness, withdrawn, and anxiety  I provided cognitive reframing aimed at enhancing coping skills.    Treatment Plan/Recommendations: Weekly behavioral intervention

## 2021-04-20 ENCOUNTER — Ambulatory Visit
Admission: RE | Admit: 2021-04-20 | Discharge: 2021-04-20 | Disposition: A | Discharge status: Home / Self Care | Payer: Medicare Other | Source: Ambulatory Visit

## 2021-04-20 DIAGNOSIS — R4689 Other symptoms and signs involving appearance and behavior: Secondary | ICD-10-CM | POA: Insufficient documentation

## 2021-04-20 DIAGNOSIS — F4321 Adjustment disorder with depressed mood: Secondary | ICD-10-CM

## 2021-04-20 DIAGNOSIS — R4589 Other symptoms and signs involving emotional state: Secondary | ICD-10-CM | POA: Insufficient documentation

## 2021-04-20 DIAGNOSIS — F419 Anxiety disorder, unspecified: Secondary | ICD-10-CM | POA: Insufficient documentation

## 2021-04-20 NOTE — Progress Notes (Signed)
[facility]  Outpatient Neuro Rehabilitation Department  [address] [place] [address]  Phone: [phone]    Fax: [phone]                 NEUROPSYCHOLOGICAL PROGRESS NOTE      PATIENT: Jonathan [name] DOB: Jan 31, 1951   MRN: [id number]  AGE: 70 y.o.    Primary Language:English    Interpreter:          Referring Provider:         Date of Service: 04/20/2021       General Observations:    [name] appeared alert and oriented to person, place, and time.  [name] maintained eye contact and presented as cooperative and engaged throughout the course of the meeting.  Affect was appropriate for the situation.  Thought processes were logical and goal directed.  Expressive speech was fluent.  Conversational speech was intact.  There was no evidence of psychosis or delusions.  [name] explicitly denied SI and HI.    Intervention:  Health and behavior intervention    Symptoms:  sadness, stubborn, withdrawn, and anxiety  I provided cognitive reframing aimed at enhancing coping skills.    Treatment Plan/Recommendations: Weekly behavioral intervention

## 2021-04-22 ENCOUNTER — Ambulatory Visit
Admission: RE | Admit: 2021-04-22 | Discharge: 2021-04-22 | Disposition: A | Discharge status: Home / Self Care | Payer: Medicare Other | Source: Ambulatory Visit

## 2021-04-22 ENCOUNTER — Inpatient Hospital Stay
Admission: RE | Admit: 2021-04-22 | Discharge: 2021-04-22 | Disposition: A | Discharge status: Home / Self Care | Payer: Medicare Other | Source: Ambulatory Visit

## 2021-04-22 DIAGNOSIS — F419 Anxiety disorder, unspecified: Secondary | ICD-10-CM | POA: Insufficient documentation

## 2021-04-22 DIAGNOSIS — S069X1D Unspecified intracranial injury with loss of consciousness of 30 minutes or less, subsequent encounter: Secondary | ICD-10-CM

## 2021-04-22 DIAGNOSIS — R278 Other lack of coordination: Secondary | ICD-10-CM

## 2021-04-22 DIAGNOSIS — Z9181 History of falling: Secondary | ICD-10-CM

## 2021-04-22 DIAGNOSIS — R4589 Other symptoms and signs involving emotional state: Secondary | ICD-10-CM | POA: Insufficient documentation

## 2021-04-22 DIAGNOSIS — F4321 Adjustment disorder with depressed mood: Secondary | ICD-10-CM

## 2021-04-22 DIAGNOSIS — R2681 Unsteadiness on feet: Secondary | ICD-10-CM

## 2021-04-22 LAB — PT/INR
PT INR: 2.8 — ABNORMAL HIGH (ref 0.9–1.1)
PT: 32.9 s — ABNORMAL HIGH (ref 10.1–12.9)

## 2021-04-22 NOTE — Progress Notes (Signed)
[facility]  Outpatient Neuro Rehabilitation Department  [address], [place] [address]  Phone: [phone]    Fax: [phone]                 NEUROPSYCHOLOGICAL PROGRESS NOTE      PATIENT: Jonathan [name] DOB: July 18, 1951   MRN: [id number]  AGE: 70 y.o.    Primary Language:English    Interpreter:          Referring Provider:         Date of Service: 04/22/2021       General Observations:    [name] appeared alert and oriented to person, place, and time.  [name] maintained eye contact and presented as cooperative and engaged throughout the course of the meeting.  Affect was appropriate for the situation.  Thought processes were logical and goal directed.  Expressive speech was fluent.  Conversational speech was intact.  There was no evidence of psychosis or delusions.  [name] explicitly denied SI and HI.    Intervention:  Health and behavior intervention    Symptoms:  sadness and anxiety  I provided cognitive reframing aimed at enhancing coping skills.    Treatment Plan/Recommendations: Weekly behavioral intervention

## 2021-04-22 NOTE — PT Progress Note (Signed)
[facility]  Outpatient Neuro Rehabilitation Department  [address], [place] [address]  Phone: [phone]    Fax: [phone]     Physical Therapy - Treatment Note      PATIENT: Jonathan [name] DOB: 21-Oct-1950   MRN: [id number]  AGE: 70 y.o.    Primary Language: English  Interpreter: N/A - English is preferred language     Referring Provider:   Roland Rack, MD   CERTIFICATION DATES: 02/25/2021 - 05/27/21     Medical Diagnosis: G91.9 Hydrocephalus; R26.81 Gait instability   Therapy Diagnosis:   1. Unsteady gait        2. Personal history of fall        3. Abnormal coordination        4. Traumatic brain injury, with loss of consciousness of 30 minutes or less, subsequent encounter              Date of Service: 04/22/21   Start Time: 1101   Stop Time: 1201   Time Calculation: 60 min   Visit #: 9     Rehabilitation Precautions/Restrictions:   Other Precautions: Falls, pacemaker    SUBJECTIVE     Patient Report: I hope to reach 10,000 steps today.    Patient/Caregiver Goals: 1. To improve my balance and my confidence with walking. 2. To not have to think so much about my balance or walking.      OBJECTIVE     General Observation: Patient received in lobby, ambulates to gym mod I with SPC.  Increased forward head with increased cervical flexion    Pain/ Re-Assessment: denies pain before and after session      Interventions: Neuromuscular Re-Education  Interventions guided to improve dynamic balance and improve motor control/coordination    8" alternating toe taps 2x20    Completed in parallel bars:  foam standing, feet together  foam standing self ball toss   foam standing PT tossing ball to patient to catch   SLS B 20 seconds x3   hurdles, Single limb, reciprocal stepping  balance beam foam walk    gait with alternating speed and head turns  karaoke for coordination and balance  prone prop on elbows with B quad stretch      Long Term Goal: 10 visits from initial evaluation 02/25/21 Status:    LTG 1: Pt will improve Modified CTSIB score to 120/120 demosntrating improved standing balance and reducing risk of falls within his home and community. Progressing  and Addressed   LTG 2: Pt will be independent and compliant with home walking and balance exercise program to reduce falls risk within the home. Progressing  and Addressed   LTG 3: Pt will complete floor transfers independently for fall recovery. Progressing            Education  Audience: Patient  Topics:  HEP     Mode of Education Provided:  Explanation and Demonstration    Learner Response:  Applied knowledge and Verbalized understanding  Further Teaching Required:  Needs practice and Needs reinforcement      ASSESSMENT     Patient is making progress towards PT goals and has improved dynamic balance. Patient is motivated during therapy to improve and challenge himself. Patient continues to use [facility] outside of therapy session. Patient would continue to benefit from skilled Outpatient PT to improve patient's dynamic balance and gait pattern to allow patient to safety ambulate in community, ie. grocery store and mall.  PLAN/RECOMMENDATIONS      Treatment Frequency/Duration: Continue Outpatient Physical Therapy to achieve goals per previously established plan of care.   PT Frequency Recommended: 2x/week for 10 visits  DME Recommended for Discharge: Patient already has needed equipment  Recommended Consults: None.    Equipment Recommended: Patient already has needed equipment    Recommended Consults: None    Development of Plan of Care: No change to plan of care today. Patient and/or family continue to be in agreement with plan of care.     The patient/family has been instructed to contact the clinic if any questions or problems should arise.    Signature: Camelia Eng, PT, DPT  04/22/2021

## 2021-04-27 ENCOUNTER — Ambulatory Visit
Admission: RE | Admit: 2021-04-27 | Discharge: 2021-04-27 | Disposition: A | Discharge status: Home / Self Care | Payer: Medicare Other | Source: Ambulatory Visit | Attending: Physician Assistant | Admitting: Physician Assistant

## 2021-04-27 ENCOUNTER — Ambulatory Visit
Admission: RE | Admit: 2021-04-27 | Discharge: 2021-04-27 | Disposition: A | Discharge status: Home / Self Care | Payer: Medicare Other | Source: Ambulatory Visit

## 2021-04-27 DIAGNOSIS — Z9181 History of falling: Secondary | ICD-10-CM

## 2021-04-27 DIAGNOSIS — R2681 Unsteadiness on feet: Secondary | ICD-10-CM

## 2021-04-27 DIAGNOSIS — R278 Other lack of coordination: Secondary | ICD-10-CM

## 2021-04-27 DIAGNOSIS — S069X1D Unspecified intracranial injury with loss of consciousness of 30 minutes or less, subsequent encounter: Secondary | ICD-10-CM

## 2021-04-27 DIAGNOSIS — F4321 Adjustment disorder with depressed mood: Secondary | ICD-10-CM | POA: Insufficient documentation
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
## 2021-04-27 NOTE — Progress Notes (Signed)
[facility]  Outpatient Neuro Rehabilitation Department  [address] [place] [address]  Phone: [phone]    Fax: [phone]                 NEUROPSYCHOLOGICAL PROGRESS NOTE      PATIENT: Jonathan [name] DOB: 1951-07-30   MRN: [id number]  AGE: 70 y.o.    Primary Language:English    Interpreter:          Referring Provider:         Date of Service: 04/27/2021       General Observations:    [name] appeared alert and oriented to person, place, and time.  [name] maintained eye contact and presented as cooperative and engaged throughout the course of the meeting.  Affect was appropriate for the situation.  Thought processes were logical and goal directed.  Expressive speech was fluent.  Conversational speech was intact.  There was no evidence of psychosis or delusions.  [name] explicitly denied SI and HI.    Intervention:  Health and behavior intervention    Symptoms:  sadness and anxiety  I provided cognitive reframing aimed at enhancing coping skills.    Treatment Plan/Recommendations: Weekly behavioral intervention

## 2021-04-29 ENCOUNTER — Ambulatory Visit
Admission: RE | Admit: 2021-04-29 | Discharge: 2021-04-29 | Disposition: A | Discharge status: Home / Self Care | Payer: Medicare Other | Source: Ambulatory Visit | Attending: Specialist | Admitting: Specialist

## 2021-04-29 ENCOUNTER — Ambulatory Visit
Admission: RE | Admit: 2021-04-29 | Discharge: 2021-04-29 | Disposition: A | Discharge status: Home / Self Care | Payer: Medicare Other | Source: Ambulatory Visit | Attending: Physician Assistant | Admitting: Physician Assistant

## 2021-04-29 DIAGNOSIS — F419 Anxiety disorder, unspecified: Secondary | ICD-10-CM | POA: Insufficient documentation

## 2021-04-29 DIAGNOSIS — F4321 Adjustment disorder with depressed mood: Secondary | ICD-10-CM

## 2021-04-29 DIAGNOSIS — R278 Other lack of coordination: Secondary | ICD-10-CM | POA: Insufficient documentation

## 2021-04-29 DIAGNOSIS — R2681 Unsteadiness on feet: Secondary | ICD-10-CM

## 2021-04-29 DIAGNOSIS — G919 Hydrocephalus, unspecified: Secondary | ICD-10-CM | POA: Insufficient documentation

## 2021-04-29 DIAGNOSIS — Z9181 History of falling: Secondary | ICD-10-CM | POA: Insufficient documentation

## 2021-04-29 DIAGNOSIS — R2689 Other abnormalities of gait and mobility: Secondary | ICD-10-CM | POA: Insufficient documentation
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
## 2021-04-29 NOTE — Progress Notes (Signed)
[facility]  Outpatient Neuro Rehabilitation Department  [address], [place] [address]  Phone: [phone]    Fax: [phone]                 NEUROPSYCHOLOGICAL PROGRESS NOTE      PATIENT: Jonathan [name] DOB: Dec 20, 1950   MRN: [id number]  AGE: 70 y.o.    Primary Language:English    Interpreter:          Referring Provider:         Date of Service: 04/29/2021       General Observations:    Jonathan [name] appeared alert and oriented to person, place, and time.  Jonathan [name] maintained eye contact and presented as cooperative and engaged throughout the course of the meeting.  Affect was appropriate for the situation.  Thought processes were logical and goal directed.  Expressive speech was fluent.  Conversational speech was intact.  There was no evidence of psychosis or delusions.  Jonathan [name] explicitly denied SI and HI.    Intervention:  Health and behavior intervention    Symptoms:  Anxiety  I provided cognitive reframing aimed at facilitating the grieving process    Treatment Plan/Recommendations: Weekly behavioral intervention

## 2021-05-04 ENCOUNTER — Ambulatory Visit
Admission: RE | Admit: 2021-05-04 | Discharge: 2021-05-04 | Disposition: A | Discharge status: Home / Self Care | Payer: Medicare Other | Source: Ambulatory Visit | Attending: Specialist | Admitting: Specialist

## 2021-05-04 DIAGNOSIS — F4321 Adjustment disorder with depressed mood: Secondary | ICD-10-CM | POA: Insufficient documentation

## 2021-05-04 DIAGNOSIS — X58XXXS Exposure to other specified factors, sequela: Secondary | ICD-10-CM | POA: Insufficient documentation

## 2021-05-04 DIAGNOSIS — R278 Other lack of coordination: Secondary | ICD-10-CM | POA: Insufficient documentation

## 2021-05-04 DIAGNOSIS — H5330 Unspecified disorder of binocular vision: Secondary | ICD-10-CM | POA: Insufficient documentation

## 2021-05-04 DIAGNOSIS — S062X1S Diffuse traumatic brain injury with loss of consciousness of 30 minutes or less, sequela: Secondary | ICD-10-CM | POA: Insufficient documentation

## 2021-05-04 DIAGNOSIS — R41844 Frontal lobe and executive function deficit: Secondary | ICD-10-CM | POA: Insufficient documentation

## 2021-05-04 NOTE — Progress Notes (Signed)
[facility]  Outpatient Neuro Rehabilitation Department  [address] [place] [address]  Phone: [phone]    Fax: [phone]                 NEUROPSYCHOLOGICAL PROGRESS NOTE      PATIENT: Jonathan [name] DOB: Sep 27, 1950   MRN: [id number]  AGE: 70 y.o.    Primary Language:English    Interpreter:          Referring Provider: Roland Rack, MD       Date of Service: 05/04/2021       General Observations:    Jonathan [name] appeared alert and oriented to person, place, and time.  Jonathan [name] maintained eye contact and presented as cooperative and engaged throughout the course of the meeting.  Affect was appropriate for the situation.  Thought processes were logical and goal directed.  Expressive speech was fluent.  Conversational speech was intact.  There was no evidence of psychosis or delusions.  Jonathan [name] explicitly denied SI and HI.    Intervention:  Health and behavior intervention    Symptoms:  Irritability and anxiety  I provided cognitive reframing aimed at enhancing emotion regulation    Treatment Plan/Recommendations: Weekly behavioral intervention

## 2021-05-06 ENCOUNTER — Ambulatory Visit
Admission: RE | Admit: 2021-05-06 | Discharge: 2021-05-06 | Disposition: A | Discharge status: Home / Self Care | Payer: Medicare Other | Source: Ambulatory Visit

## 2021-05-06 DIAGNOSIS — F4321 Adjustment disorder with depressed mood: Secondary | ICD-10-CM

## 2021-05-06 NOTE — Progress Notes (Signed)
[facility]  Outpatient Neuro Rehabilitation Department  [address], [place] [address]  Phone: [phone]    Fax: [phone]                 NEUROPSYCHOLOGICAL PROGRESS NOTE      PATIENT: Jonathan [name] DOB: 09-14-1950   MRN: [id number]  AGE: 70 y.o.    Primary Language:English    Interpreter:          Referring Provider: Roland Rack, MD       Date of Service: 05/06/2021       General Observations:    Jonathan [name] appeared alert and oriented to person, place, and time.  Jonathan [name] maintained eye contact and presented as cooperative and engaged throughout the course of the meeting.  Affect was appropriate for the situation.  Thought processes were logical and goal directed.  Expressive speech was fluent.  Conversational speech was intact.  There was no evidence of psychosis or delusions.  Jonathan [name] explicitly denied SI and HI.    Intervention:  Health and behavior intervention    Symptoms:  sadness and anxiety  I provided cognitive reframing aimed at enhancing coping skills.    Treatment Plan/Recommendations: Weekly behavioral intervention

## 2021-05-11 ENCOUNTER — Ambulatory Visit
Admission: RE | Admit: 2021-05-11 | Discharge: 2021-05-11 | Disposition: A | Discharge status: Home / Self Care | Payer: Medicare Other | Source: Ambulatory Visit

## 2021-05-11 DIAGNOSIS — F4321 Adjustment disorder with depressed mood: Secondary | ICD-10-CM

## 2021-05-11 NOTE — Progress Notes (Signed)
[facility]  Outpatient Neuro Rehabilitation Department  [address], [place] [address]  Phone: [phone]    Fax: [phone]                 NEUROPSYCHOLOGICAL PROGRESS NOTE      PATIENT: Jonathan [name] DOB: [birth year]   MRN: [id number]  AGE: 70 y.o.    Primary Language:English    Interpreter:          Referring Provider: Roland Rack, MD       Date of Service: 05/11/2021       General Observations:    [name] appeared alert and oriented to person, place, and time.  [name] maintained eye contact and presented as cooperative and engaged throughout the course of the meeting.  Affect was appropriate for the situation.  Thought processes were logical and goal directed.  Expressive speech was fluent.  Conversational speech was intact.  There was no evidence of psychosis or delusions.  [name] explicitly denied SI and HI.    Intervention:  Health and behavior intervention    Symptoms:  sadness and Anxiety  I provided cognitive reframing aimed at enhancing coping skills.    Treatment Plan/Recommendations: Weekly behavioral intervention

## 2021-05-13 ENCOUNTER — Ambulatory Visit
Admission: RE | Admit: 2021-05-13 | Discharge: 2021-05-13 | Disposition: A | Discharge status: Home / Self Care | Payer: Medicare Other | Source: Ambulatory Visit

## 2021-05-13 ENCOUNTER — Ambulatory Visit
Admission: RE | Admit: 2021-05-13 | Discharge: 2021-05-13 | Disposition: A | Discharge status: Home / Self Care | Payer: Medicare Other | Source: Ambulatory Visit | Attending: Specialist | Admitting: Specialist

## 2021-05-13 DIAGNOSIS — I4891 Unspecified atrial fibrillation: Secondary | ICD-10-CM | POA: Insufficient documentation

## 2021-05-13 DIAGNOSIS — F4321 Adjustment disorder with depressed mood: Secondary | ICD-10-CM

## 2021-05-13 LAB — PT/INR
PT INR: 1.7 — ABNORMAL HIGH (ref 0.9–1.1)
PT: 19.3 s — ABNORMAL HIGH (ref 10.1–12.9)

## 2021-05-13 NOTE — Progress Notes (Signed)
[facility]  Outpatient Neuro Rehabilitation Department  [address], [place] [address]  Phone: [phone]    Fax: [phone]                 NEUROPSYCHOLOGICAL PROGRESS NOTE      PATIENT: Jonathan [name] DOB: 05-30-1951   MRN: [id number]  AGE: 70 y.o.    Primary Language:English    Interpreter:          Referring Provider: Roland Rack, MD       Date of Service: 05/13/2021       General Observations:    [name] appeared alert and oriented to person, place, and time.  [name] maintained eye contact and presented as cooperative and engaged throughout the course of the meeting.  Affect was appropriate for the situation.  Thought processes were logical and goal directed.  Expressive speech was fluent.  Conversational speech was intact.  There was no evidence of psychosis or delusions.  [name] explicitly denied SI and HI.    Intervention:  Health and behavior intervention    Symptoms:  hyperactive and anxiety  I provided active listening and cognitive reframing aimed at enhancing coping skills.    Treatment Plan/Recommendations: Weekly behavioral intervention

## 2021-05-18 ENCOUNTER — Ambulatory Visit
Admission: RE | Admit: 2021-05-18 | Discharge: 2021-05-18 | Disposition: A | Discharge status: Home / Self Care | Payer: Medicare Other | Source: Ambulatory Visit

## 2021-05-18 DIAGNOSIS — F4321 Adjustment disorder with depressed mood: Secondary | ICD-10-CM

## 2021-05-18 NOTE — Progress Notes (Signed)
[facility]  Outpatient Neuro Rehabilitation Department  [address] [place] [address]  Phone: [phone]    Fax: [phone]                 NEUROPSYCHOLOGICAL PROGRESS NOTE      PATIENT: Jonathan [name] DOB: [birth year]   MRN: [id number]  AGE: 70 y.o.    Primary Language:English    Interpreter:          Referring Provider: Roland Rack, MD       Date of Service: 05/18/2021       General Observations:    [name] appeared alert and oriented to person, place, and time.  [name] maintained eye contact and presented as cooperative and engaged throughout the course of the meeting.  Affect was appropriate for the situation.  Thought processes were logical and goal directed.  Expressive speech was fluent.  Conversational speech was intact.  There was no evidence of psychosis or delusions.  [name] explicitly denied SI and HI.    Intervention:  Health and behavior intervention    Symptoms:  Anxiety and worry  I provided cognitive reframing aimed at enhancing coping skills.    Treatment Plan/Recommendations: Weekly behavioral intervention

## 2021-05-20 ENCOUNTER — Ambulatory Visit
Admission: RE | Admit: 2021-05-20 | Discharge: 2021-05-20 | Disposition: A | Discharge status: Home / Self Care | Payer: Medicare Other | Source: Ambulatory Visit

## 2021-05-20 DIAGNOSIS — F4321 Adjustment disorder with depressed mood: Secondary | ICD-10-CM

## 2021-05-20 NOTE — Progress Notes (Signed)
[facility]  Outpatient Neuro Rehabilitation Department  [address], [place] [address]  Phone: [phone]    Fax: [phone]                 NEUROPSYCHOLOGICAL PROGRESS NOTE      PATIENT: Jonathan [name] DOB: 11-23-1950   MRN: [id number]  AGE: 70 y.o.    Primary Language:English    Interpreter:          Referring Provider: Roland Rack, MD       Date of Service: 05/20/2021       General Observations:    [name] appeared alert and oriented to person, place, and time.  [name] maintained eye contact and presented as cooperative and engaged throughout the course of the meeting.  Affect was appropriate for the situation.  Thought processes were logical and goal directed.  Expressive speech was fluent.  Conversational speech was intact.  There was no evidence of psychosis or delusions.  [name] explicitly denied SI and HI.    Intervention:  Health and behavior intervention    Symptoms:  Improving depression continued anxiety  I provided active listening and supportive statements aimed at reinforcing positive emotional changes    Treatment Plan/Recommendations: Weekly behavioral intervention

## 2021-05-25 ENCOUNTER — Ambulatory Visit
Admission: RE | Admit: 2021-05-25 | Discharge: 2021-05-25 | Disposition: A | Discharge status: Home / Self Care | Payer: Medicare Other | Source: Ambulatory Visit

## 2021-05-25 ENCOUNTER — Ambulatory Visit
Admission: RE | Admit: 2021-05-25 | Discharge: 2021-05-25 | Disposition: A | Discharge status: Home / Self Care | Payer: Medicare Other | Source: Ambulatory Visit | Attending: Specialist | Admitting: Specialist

## 2021-05-25 DIAGNOSIS — F4321 Adjustment disorder with depressed mood: Secondary | ICD-10-CM

## 2021-05-25 DIAGNOSIS — I4891 Unspecified atrial fibrillation: Secondary | ICD-10-CM | POA: Insufficient documentation

## 2021-05-25 LAB — PT/INR
PT INR: 2 — ABNORMAL HIGH (ref 0.9–1.1)
PT: 23.3 s — ABNORMAL HIGH (ref 10.1–12.9)

## 2021-05-25 NOTE — Progress Notes (Signed)
[facility]  Outpatient Neuro Rehabilitation Department  [address] [place] [address]  Phone: [phone]    Fax: [phone]                 NEUROPSYCHOLOGICAL PROGRESS NOTE      PATIENT: Jonathan [name] DOB: [birth year]   MRN: [id number]  AGE: 70 y.o.    Primary Language:English    Interpreter:          Referring Provider: Roland Rack, MD       Date of Service: 05/25/2021       General Observations:    Jonathan [name] appeared alert and oriented to person, place, and time.  Jonathan [name] maintained eye contact and presented as cooperative and engaged throughout the course of the meeting.  Affect was appropriate for the situation.  Thought processes were logical and goal directed.  Expressive speech was fluent.  Conversational speech was intact.  There was no evidence of psychosis or delusions.  Jonathan [name] explicitly denied SI and HI.    Intervention:  Health and behavior intervention    Symptoms:  sadness and anxiety  I provided cognitive reframing aimed at enhancing coping skills.    Treatment Plan/Recommendations: Weekly behavioral intervention

## 2021-05-26 IMAGING — CR DX Chest X-Ray Portable
1 series · 1 of 1 positions shown · non-contrast
Comparison: 02/22/19

PORTABLE SEMIUPRIGHT CHEST 1 VIEW
INDICATION: Ventilated

[portable]
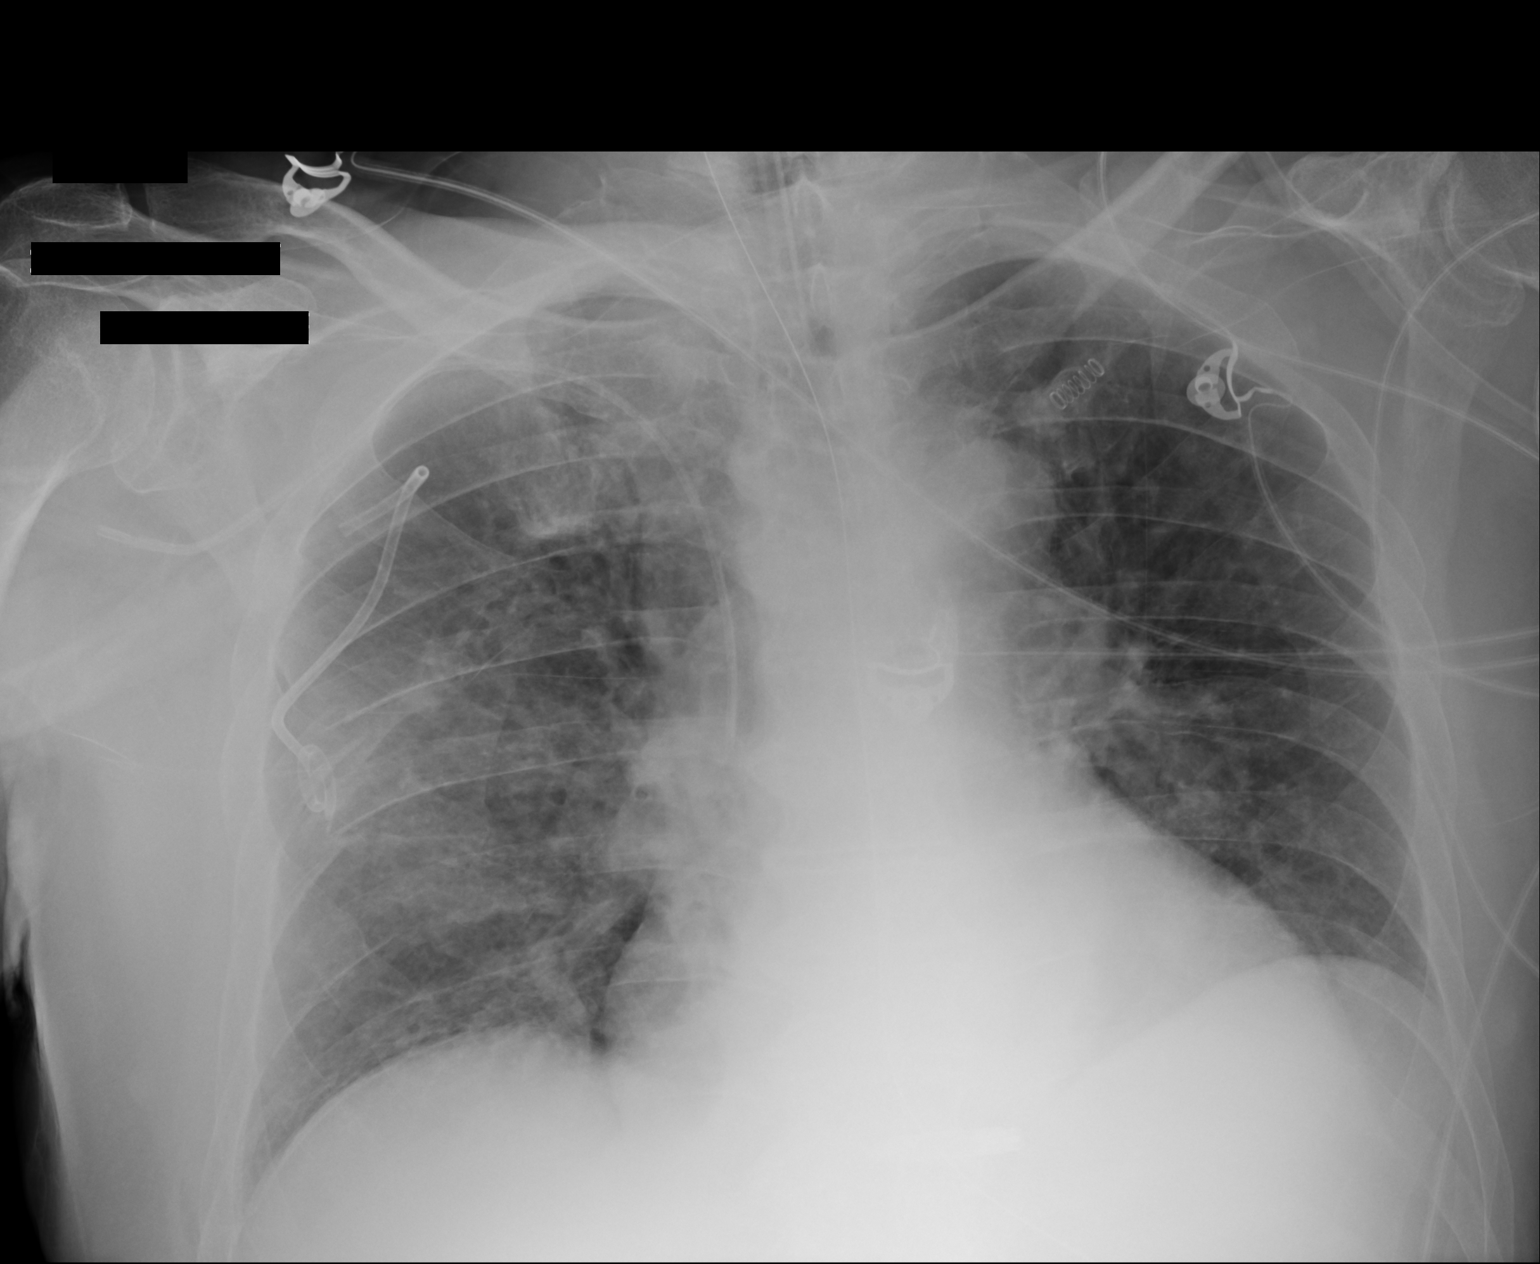

[1 of 1 positions shown; findings below may reference images not displayed]

IMPRESSION: Similar ET tube, NG tube, right chest tube, right PICC line.                              
 Cardiac silhouette is stable.                                                             
 Similar patchy airspace opacities throughout the right lung.                              
 Trace right apical pneumothorax.                                                          
 Right rib fractures are better delineated on CT.

## 2021-05-27 ENCOUNTER — Ambulatory Visit
Admission: RE | Admit: 2021-05-27 | Discharge: 2021-05-27 | Disposition: A | Discharge status: Home / Self Care | Payer: Medicare Other | Source: Ambulatory Visit | Attending: Specialist | Admitting: Specialist

## 2021-05-27 DIAGNOSIS — F4321 Adjustment disorder with depressed mood: Secondary | ICD-10-CM | POA: Insufficient documentation

## 2021-05-27 NOTE — Progress Notes (Signed)
[facility]  Outpatient Neuro Rehabilitation Department  [address], [place] [address]  Phone: [phone]    Fax: [phone]                 NEUROPSYCHOLOGICAL PROGRESS NOTE      PATIENT: Jonathan [name] DOB: 26-Dec-1950   MRN: [id number]  AGE: 70 y.o.    Primary Language:English    Interpreter:          Referring Provider: Roland Rack, MD       Date of Service: 05/27/2021       General Observations:    [name] appeared alert and oriented to person, place, and time.  [name] maintained eye contact and presented as cooperative and engaged throughout the course of the meeting.  Affect was appropriate for the situation.  Thought processes were logical and goal directed.  Expressive speech was fluent.  Conversational speech was intact.  There was no evidence of psychosis or delusions.  [name] explicitly denied SI and HI.    Intervention:  Health and behavior intervention    Symptoms:  hyperactive and flight of ideas, pressured speech, reduced turn taking skills.   Feeling more happy and socially engaged  I provide active listening, cognitive reframing, and education aimed at increasing frustration tolerance and self-awareness    Treatment Plan/Recommendations: Weekly behavioral intervention

## 2021-05-28 IMAGING — CR DX Chest X-Ray Portable
1 series · 1 of 1 positions shown · non-contrast
Comparison: [DATE], 6060 0900

EXAM: Frontal chest x-ray
INDICATION: Pneumothorax

[AP]
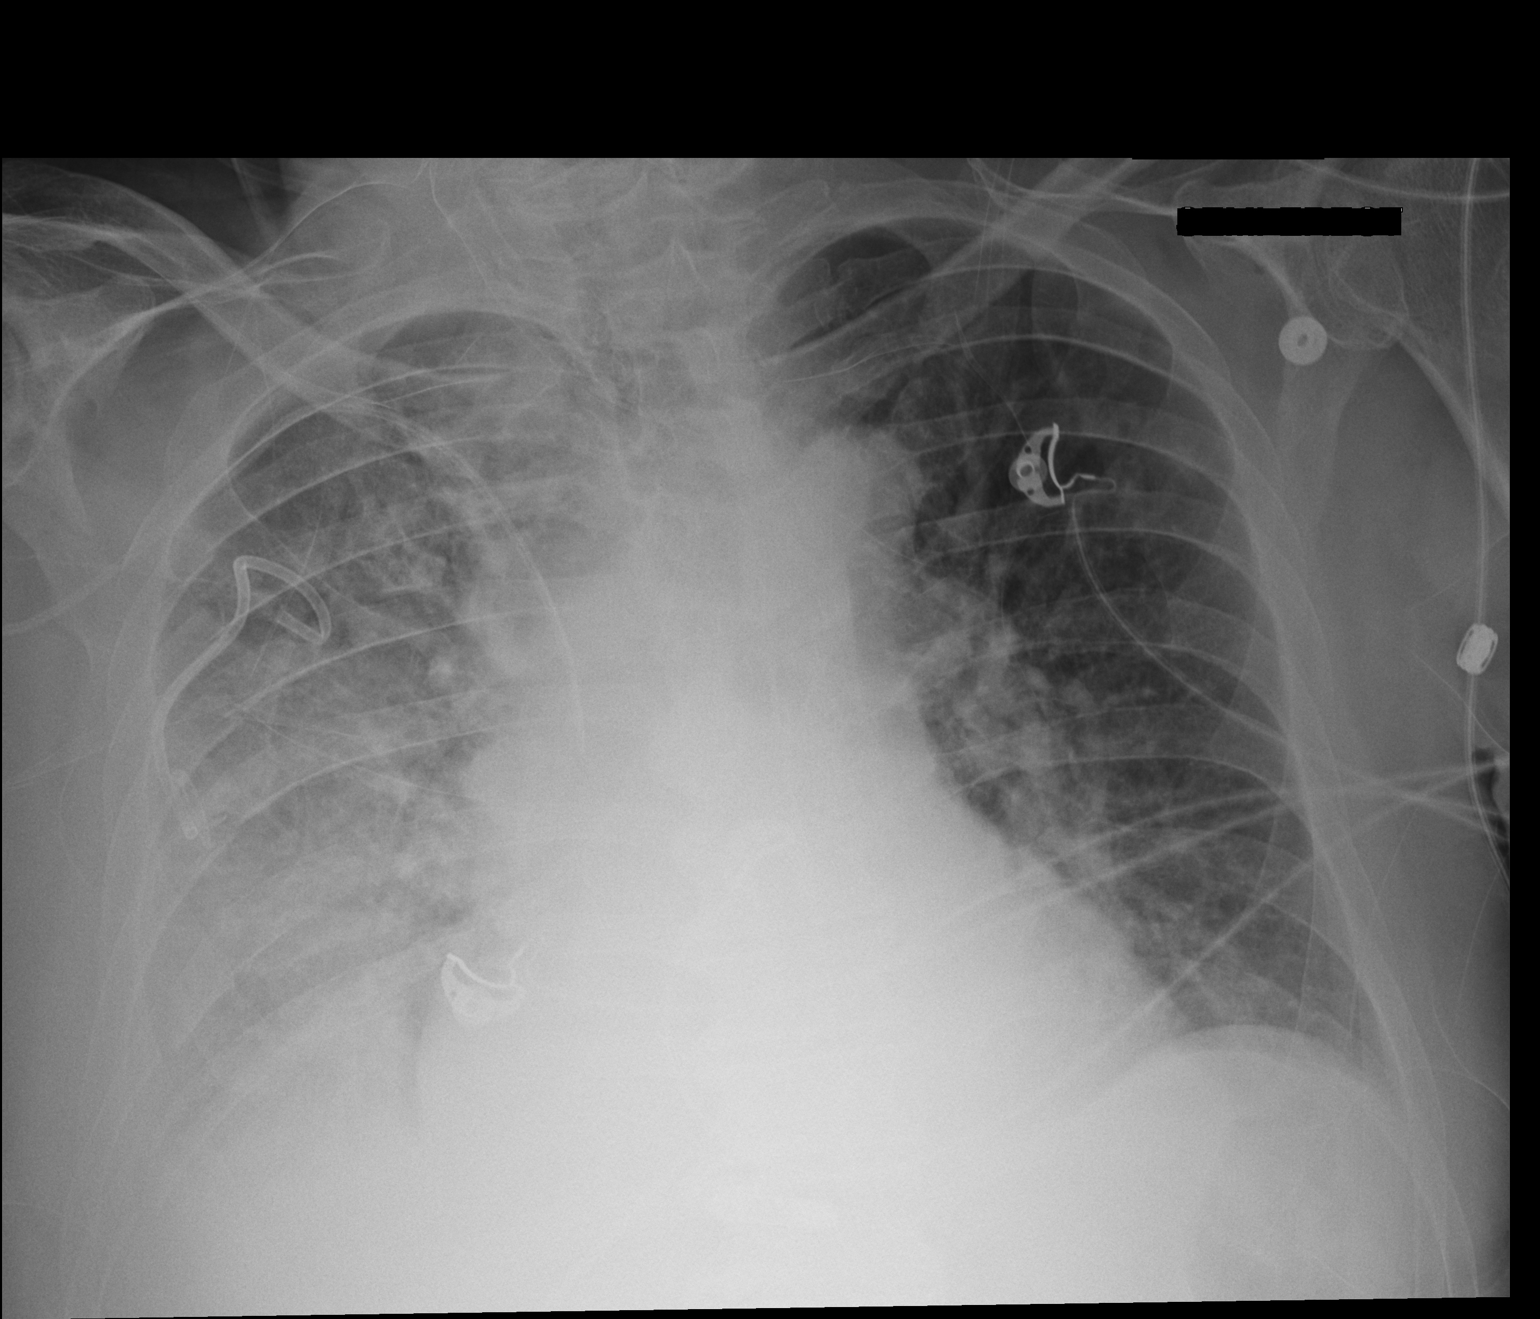

[1 of 1 positions shown; findings below may reference images not displayed]

IMPRESSION: Right pigtail chest tube catheter is in similar position. Right central line tip          
 overlies the SVC.                                                                         
 Similar cardiomediastinal silhouette.                                                     
 Similar lucency along the right heart border, possible small amount of residual           
 pleural gas. Mild worsening of right pleural fluid and adjacent airspace                  
 opacities. Left lung is likely clear.                                                     
 Multiple right rib fractures are again seen.

## 2021-06-01 ENCOUNTER — Ambulatory Visit
Admission: RE | Admit: 2021-06-01 | Discharge: 2021-06-01 | Disposition: A | Discharge status: Home / Self Care | Payer: Medicare Other | Source: Ambulatory Visit | Attending: Specialist | Admitting: Specialist

## 2021-06-01 ENCOUNTER — Inpatient Hospital Stay
Admission: RE | Admit: 2021-06-01 | Discharge: 2021-06-01 | Disposition: A | Discharge status: Home / Self Care | Payer: Medicare Other | Source: Ambulatory Visit | Attending: Specialist | Admitting: Specialist

## 2021-06-01 DIAGNOSIS — R278 Other lack of coordination: Secondary | ICD-10-CM | POA: Insufficient documentation

## 2021-06-01 DIAGNOSIS — I4891 Unspecified atrial fibrillation: Secondary | ICD-10-CM | POA: Insufficient documentation

## 2021-06-01 DIAGNOSIS — F4321 Adjustment disorder with depressed mood: Secondary | ICD-10-CM | POA: Insufficient documentation

## 2021-06-01 DIAGNOSIS — X58XXXS Exposure to other specified factors, sequela: Secondary | ICD-10-CM | POA: Insufficient documentation

## 2021-06-01 DIAGNOSIS — H5330 Unspecified disorder of binocular vision: Secondary | ICD-10-CM | POA: Insufficient documentation

## 2021-06-01 DIAGNOSIS — S062X1S Diffuse traumatic brain injury with loss of consciousness of 30 minutes or less, sequela: Secondary | ICD-10-CM | POA: Insufficient documentation

## 2021-06-01 DIAGNOSIS — R41844 Frontal lobe and executive function deficit: Secondary | ICD-10-CM | POA: Insufficient documentation

## 2021-06-01 LAB — PT/INR
PT INR: 1.8 — ABNORMAL HIGH (ref 0.9–1.1)
PT: 20.4 s — ABNORMAL HIGH (ref 10.1–12.9)

## 2021-06-03 ENCOUNTER — Ambulatory Visit: Payer: Medicare Other

## 2021-06-08 ENCOUNTER — Ambulatory Visit
Admission: RE | Admit: 2021-06-08 | Discharge: 2021-06-08 | Disposition: A | Discharge status: Home / Self Care | Payer: Medicare Other | Source: Ambulatory Visit | Attending: Specialist | Admitting: Specialist

## 2021-06-08 DIAGNOSIS — F4321 Adjustment disorder with depressed mood: Secondary | ICD-10-CM | POA: Insufficient documentation

## 2021-06-08 NOTE — Progress Notes (Signed)
[facility]  Outpatient Neuro Rehabilitation Department  [address] [place] [address]  Phone: [phone]    Fax: [phone]                 NEUROPSYCHOLOGICAL PROGRESS NOTE      PATIENT: Jonathan [name] DOB: Jun 09, 1951   MRN: [id number]  AGE: 70 y.o.    Primary Language:English    Interpreter:          Referring Provider: Roland Rack, MD       Date of Service: 06/08/2021       General Observations:    [name] appeared alert and oriented to person, place, and time.  [name] maintained eye contact and presented as cooperative and engaged throughout the course of the meeting.  Affect was appropriate for the situation.  Thought processes were logical and goal directed.  Expressive speech was fluent.  Conversational speech was intact.  There was no evidence of psychosis or delusions.  [name] explicitly denied SI and HI.    Intervention:  Health and behavior intervention    Symptoms:  hyperactive  I provided cognitive reframing aimed at enhancing good decision making and impulse control    Treatment Plan/Recommendations: Weekly behavioral intervention

## 2021-06-10 ENCOUNTER — Ambulatory Visit
Admission: RE | Admit: 2021-06-10 | Discharge: 2021-06-10 | Disposition: A | Discharge status: Home / Self Care | Payer: Medicare Other | Source: Ambulatory Visit

## 2021-06-10 DIAGNOSIS — F4321 Adjustment disorder with depressed mood: Secondary | ICD-10-CM

## 2021-06-10 NOTE — Progress Notes (Signed)
[facility]  Outpatient Neuro Rehabilitation Department  [address], [place] [address]  Phone: [phone]    Fax: [phone]                 NEUROPSYCHOLOGICAL PROGRESS NOTE      PATIENT: Jonathan [name] DOB: [birth year]   MRN: [id number]  AGE: 70 y.o.    Primary Language:English    Interpreter:          Referring Provider: Roland Rack, MD       Date of Service: 06/10/2021       General Observations:    [name] appeared alert and oriented to person, place, and time.  [name] maintained eye contact and presented as cooperative and engaged throughout the course of the meeting.  Affect was appropriate for the situation.  Thought processes were logical and goal directed.  Expressive speech was fluent.  Conversational speech was intact.  There was no evidence of psychosis or delusions.  [name] explicitly denied SI and HI.    Intervention:  Health and behavior intervention    Symptoms:  hyperactive, sadness, and anxiety  I provided interpretations, clarifications, and cognitive reframing aimed at enhancing coping skills.    Treatment Plan/Recommendations: Weekly behavioral intervention

## 2021-06-15 ENCOUNTER — Ambulatory Visit: Payer: Medicare Other

## 2021-06-17 ENCOUNTER — Ambulatory Visit: Payer: Medicare Other

## 2021-06-22 ENCOUNTER — Ambulatory Visit
Admission: RE | Admit: 2021-06-22 | Discharge: 2021-06-22 | Disposition: A | Discharge status: Home / Self Care | Payer: Medicare Other | Source: Ambulatory Visit

## 2021-06-22 DIAGNOSIS — F4321 Adjustment disorder with depressed mood: Secondary | ICD-10-CM

## 2021-06-22 NOTE — Progress Notes (Signed)
[facility]  Outpatient Neuro Rehabilitation Department  [address], [place] [address]  Phone: [phone]    Fax: [phone]                 NEUROPSYCHOLOGICAL PROGRESS NOTE      PATIENT: Jonathan [name] DOB: [birth year]   MRN: [id number]  AGE: 70 y.o.    Primary Language:English    Interpreter:          Referring Provider: Roland Rack, MD       Date of Service: 06/22/2021       General Observations:    [name] appeared alert and oriented to person, place, and time.  [name] maintained eye contact and presented as cooperative and engaged throughout the course of the meeting.  Affect was appropriate for the situation.  Thought processes were logical and goal directed.  Expressive speech was fluent.  Conversational speech was intact.  There was no evidence of psychosis or delusions.  [name] explicitly denied SI and HI    Intervention:  Health and behavior intervention    Symptoms:  hyperactive and sadness  I provided cognitive reframing aimed at reinforcing positive coping    Treatment Plan/Recommendations: Weekly behavioral intervention

## 2021-06-29 ENCOUNTER — Ambulatory Visit: Payer: Medicare Other

## 2021-07-01 ENCOUNTER — Ambulatory Visit
Admission: RE | Admit: 2021-07-01 | Discharge: 2021-07-01 | Disposition: A | Discharge status: Home / Self Care | Payer: Medicare Other | Source: Ambulatory Visit | Attending: Specialist | Admitting: Specialist

## 2021-07-01 DIAGNOSIS — R278 Other lack of coordination: Secondary | ICD-10-CM | POA: Insufficient documentation

## 2021-07-01 DIAGNOSIS — H5330 Unspecified disorder of binocular vision: Secondary | ICD-10-CM | POA: Insufficient documentation

## 2021-07-01 DIAGNOSIS — S062X1S Diffuse traumatic brain injury with loss of consciousness of 30 minutes or less, sequela: Secondary | ICD-10-CM | POA: Insufficient documentation

## 2021-07-01 DIAGNOSIS — R41844 Frontal lobe and executive function deficit: Secondary | ICD-10-CM | POA: Insufficient documentation

## 2021-07-01 DIAGNOSIS — F4321 Adjustment disorder with depressed mood: Secondary | ICD-10-CM | POA: Insufficient documentation

## 2021-07-01 DIAGNOSIS — X58XXXS Exposure to other specified factors, sequela: Secondary | ICD-10-CM | POA: Insufficient documentation

## 2021-07-01 NOTE — Progress Notes (Signed)
[facility]  Outpatient Neuro Rehabilitation Department  [address], [place] [address]  Phone: [phone]    Fax: [phone]                 NEUROPSYCHOLOGICAL PROGRESS NOTE      PATIENT: Jonathan [name] DOB: Nov 07, 1950   MRN: [id number]  AGE: 70 y.o.    Primary Language:English    Interpreter:          Referring Provider: Roland Rack, MD       Date of Service: 07/01/2021       General Observations:    [name] appeared alert and oriented to person, place, and time.  [name] maintained eye contact and presented as cooperative and engaged throughout the course of the meeting.  Affect was appropriate for the situation.  Thought processes were logical and goal directed.  Expressive speech was fluent.  Conversational speech was intact.  There was no evidence of psychosis or delusions.  [name] explicitly denied SI and HI.    Intervention:  Health and behavior intervention    Symptoms:  hyperactive and restlessness, and reduced sleep  I provided active listening, education, brain storming, and cognitive reframing aimed at enhancing coping skills.    Treatment Plan/Recommendations:    Weekly behavioral intervention  Consult with PCP to determine if he is a good candidate for medication to support sleep.

## 2021-07-02 ENCOUNTER — Inpatient Hospital Stay
Admission: RE | Admit: 2021-07-02 | Discharge: 2021-07-02 | Disposition: A | Discharge status: Home / Self Care | Payer: Medicare Other | Source: Ambulatory Visit | Attending: Specialist | Admitting: Specialist

## 2021-07-02 DIAGNOSIS — I4891 Unspecified atrial fibrillation: Secondary | ICD-10-CM | POA: Insufficient documentation

## 2021-07-02 LAB — PT/INR
PT INR: 4.6 — ABNORMAL HIGH (ref 0.9–1.1)
PT: 53.7 s — ABNORMAL HIGH (ref 10.1–12.9)

## 2021-07-06 ENCOUNTER — Ambulatory Visit
Admission: RE | Admit: 2021-07-06 | Discharge: 2021-07-06 | Disposition: A | Discharge status: Home / Self Care | Payer: Medicare Other | Source: Ambulatory Visit

## 2021-07-06 DIAGNOSIS — F4321 Adjustment disorder with depressed mood: Secondary | ICD-10-CM

## 2021-07-06 NOTE — Progress Notes (Signed)
[facility]  Outpatient Neuro Rehabilitation Department  [address] [place] [address]  Phone: [phone]    Fax: [phone]                 NEUROPSYCHOLOGICAL PROGRESS NOTE      PATIENT: Jonathan [name] DOB: 22-Jul-1951   MRN: [id number]  AGE: 70 y.o.    Primary Language:English    Interpreter:          Referring Provider: Roland Rack, MD       Date of Service: 07/06/2021       General Observations:    [name] appeared alert and oriented to person, place, and time.  [name] maintained eye contact and presented as cooperative and engaged throughout the course of the meeting.  Affect was appropriate for the situation.  Thought processes were logical and goal directed.  Expressive speech was fluent.  Conversational speech was intact.  There was no evidence of psychosis or delusions.  [name] explicitly denied SI and HI.    Intervention:  Health and behavior intervention    Symptoms:  hyperactive and anxiety, but over all improving emotionally.  He feels stable enough to reduce treatment to once per week.  I provided cognitive reframing aimed at reinforcing positive emotional gains    Treatment Plan/Recommendations: Weekly behavioral intervention

## 2021-07-08 ENCOUNTER — Ambulatory Visit: Payer: Medicare Other

## 2021-07-13 ENCOUNTER — Ambulatory Visit
Admission: RE | Admit: 2021-07-13 | Discharge: 2021-07-13 | Disposition: A | Discharge status: Home / Self Care | Payer: Medicare Other | Source: Ambulatory Visit

## 2021-07-13 ENCOUNTER — Inpatient Hospital Stay
Admission: RE | Admit: 2021-07-13 | Discharge: 2021-07-13 | Disposition: A | Discharge status: Home / Self Care | Payer: Medicare Other | Source: Ambulatory Visit

## 2021-07-13 DIAGNOSIS — F4321 Adjustment disorder with depressed mood: Secondary | ICD-10-CM

## 2021-07-13 LAB — PT/INR
PT INR: 1.5 — ABNORMAL HIGH (ref 0.9–1.1)
PT: 17.8 s — ABNORMAL HIGH (ref 10.1–12.9)

## 2021-07-13 NOTE — Progress Notes (Signed)
[facility]  Outpatient Neuro Rehabilitation Department  [address], [place] [address]  Phone: [phone]    Fax: [phone]                 NEUROPSYCHOLOGICAL PROGRESS NOTE      PATIENT: Jonathan [name] DOB: [birth year]   MRN: [id number]  AGE: 70 y.o.    Primary Language:English    Interpreter:          Referring Provider: Roland Rack, MD       Date of Service: 07/13/2021       General Observations:    Jonathan [name] appeared alert and oriented to person, place, and time.  Jonathan [name] maintained eye contact and presented as cooperative and engaged throughout the course of the meeting.  Affect was appropriate for the situation.  Thought processes were logical and goal directed.  Expressive speech was fluent.  Conversational speech was intact.  There was no evidence of psychosis or delusions.  Jonathan [name] explicitly denied SI and HI.    Intervention:  Health and behavior intervention    Symptoms:  Improving sadness  I provided cognitive reframing and active listening aimed at reinforcing positive emotional gains    Treatment Plan/Recommendations: Weekly behavioral intervention

## 2021-07-15 ENCOUNTER — Ambulatory Visit: Payer: Self-pay

## 2021-07-20 ENCOUNTER — Ambulatory Visit: Payer: Medicare Other

## 2021-07-22 ENCOUNTER — Ambulatory Visit: Payer: Self-pay

## 2021-07-27 ENCOUNTER — Ambulatory Visit: Payer: Self-pay

## 2021-07-29 ENCOUNTER — Ambulatory Visit: Payer: Self-pay

## 2021-08-03 ENCOUNTER — Ambulatory Visit: Payer: Medicare Other

## 2021-08-05 ENCOUNTER — Ambulatory Visit: Payer: Medicare Other

## 2021-08-10 ENCOUNTER — Ambulatory Visit
Admission: RE | Admit: 2021-08-10 | Discharge: 2021-08-10 | Disposition: A | Discharge status: Home / Self Care | Payer: Medicare Other | Source: Ambulatory Visit | Attending: Specialist | Admitting: Specialist

## 2021-08-10 DIAGNOSIS — F4321 Adjustment disorder with depressed mood: Secondary | ICD-10-CM | POA: Insufficient documentation

## 2021-08-10 DIAGNOSIS — R278 Other lack of coordination: Secondary | ICD-10-CM | POA: Insufficient documentation

## 2021-08-10 DIAGNOSIS — H5330 Unspecified disorder of binocular vision: Secondary | ICD-10-CM | POA: Insufficient documentation

## 2021-08-10 DIAGNOSIS — S062X1S Diffuse traumatic brain injury with loss of consciousness of 30 minutes or less, sequela: Secondary | ICD-10-CM | POA: Insufficient documentation

## 2021-08-10 DIAGNOSIS — X58XXXS Exposure to other specified factors, sequela: Secondary | ICD-10-CM | POA: Insufficient documentation

## 2021-08-10 DIAGNOSIS — R41844 Frontal lobe and executive function deficit: Secondary | ICD-10-CM | POA: Insufficient documentation

## 2021-08-10 NOTE — Progress Notes (Signed)
[facility]  Outpatient Neuro Rehabilitation Department  [address] [place] [address]  Phone: [phone]    Fax: [phone]                 NEUROPSYCHOLOGICAL PROGRESS NOTE      PATIENT: Jonathan [name] DOB: 10-18-1950   MRN: [id number]  AGE: 71 y.o.    Primary Language:English    Interpreter:          Referring Provider: Roland Rack, MD       Date of Service: 08/10/2021       General Observations:    [name] appeared alert and oriented to person, place, and time.  [name] maintained eye contact and presented as cooperative and engaged throughout the course of the meeting.  Affect was appropriate for the situation.  Thought processes were logical and goal directed.  Expressive speech was fluent.  Conversational speech was intact.  There was no evidence of psychosis or delusions.  [name] explicitly denied SI and HI.    Intervention:  Health and behavior intervention    Symptoms:  sadness and tearfulness  I provided active listening, education, and mentalization aimed at enhancing coping skills.    Treatment Plan/Recommendations: Weekly behavioral intervention

## 2021-08-19 ENCOUNTER — Ambulatory Visit: Payer: Medicare Other

## 2021-08-24 ENCOUNTER — Ambulatory Visit
Admission: RE | Admit: 2021-08-24 | Discharge: 2021-08-24 | Disposition: A | Discharge status: Home / Self Care | Payer: Medicare Other | Source: Ambulatory Visit

## 2021-08-24 DIAGNOSIS — F4321 Adjustment disorder with depressed mood: Secondary | ICD-10-CM

## 2021-08-24 NOTE — Progress Notes (Signed)
[facility]  Outpatient Neuro Rehabilitation Department  [address] [place] [address]  Phone: [phone]    Fax: [phone]                 NEUROPSYCHOLOGICAL PROGRESS NOTE      PATIENT: Jonathan [name] DOB: [birth year]   MRN: [id number]  AGE: 71 y.o.    Primary Language:English    Interpreter:          Referring Provider: Roland Rack, MD       Date of Service: 08/24/2021       General Observations:    [name] appeared alert and oriented to person, place, and time.  [name] maintained eye contact and presented as cooperative and engaged throughout the course of the meeting.  Affect was appropriate for the situation.  Thought processes were logical and goal directed.  Expressive speech was fluent.  Conversational speech was intact.  There was no evidence of psychosis or delusions.  [name] explicitly denied SI and HI.    Intervention:  Health and behavior intervention    Symptoms:  [name] described feeling tearful, angry, and scared related to a family crisis.  He described coping with realistic thinking and family support  I provided active listening and mentalization aimed at enhancing coping skills.    Treatment Plan/Recommendations: Weekly behavioral intervention

## 2021-08-26 ENCOUNTER — Ambulatory Visit: Payer: Medicare Other

## 2021-08-31 ENCOUNTER — Ambulatory Visit: Payer: Medicare Other

## 2021-09-02 ENCOUNTER — Ambulatory Visit: Payer: Medicare Other

## 2021-09-07 ENCOUNTER — Ambulatory Visit
Admission: RE | Admit: 2021-09-07 | Discharge: 2021-09-07 | Disposition: A | Discharge status: Home / Self Care | Payer: Medicare Other | Source: Ambulatory Visit | Attending: Specialist | Admitting: Specialist

## 2021-09-07 DIAGNOSIS — F4321 Adjustment disorder with depressed mood: Secondary | ICD-10-CM | POA: Insufficient documentation

## 2021-09-07 DIAGNOSIS — R41844 Frontal lobe and executive function deficit: Secondary | ICD-10-CM | POA: Insufficient documentation

## 2021-09-07 DIAGNOSIS — H5330 Unspecified disorder of binocular vision: Secondary | ICD-10-CM | POA: Insufficient documentation

## 2021-09-07 DIAGNOSIS — R278 Other lack of coordination: Secondary | ICD-10-CM | POA: Insufficient documentation

## 2021-09-07 DIAGNOSIS — S062X1S Diffuse traumatic brain injury with loss of consciousness of 30 minutes or less, sequela: Secondary | ICD-10-CM | POA: Insufficient documentation

## 2021-09-07 DIAGNOSIS — X58XXXS Exposure to other specified factors, sequela: Secondary | ICD-10-CM | POA: Insufficient documentation

## 2021-09-07 NOTE — Progress Notes (Signed)
[facility]  Outpatient Neuro Rehabilitation Department  [address], [place] [address]  Phone: [phone]    Fax: [phone]                 NEUROPSYCHOLOGICAL PROGRESS NOTE      PATIENT: Jonathan [name] DOB: Mar 10, 1951   MRN: [id number]  AGE: 71 y.o.    Primary Language:English   Interpreter:         Referring Provider: Roland Rack, MD      Date of Service: 09/07/2021       General Observations:    Jonathan [name] appeared alert and oriented to person, place, and time.  Jonathan [name] maintained eye contact and presented as cooperative and engaged throughout the course of the meeting.  Affect was appropriate for the situation.  Thought processes were logical and goal directed.  Expressive speech was fluent.  Conversational speech was intact.  There was no evidence of psychosis or delusions.  Jonathan [name] explicitly denied SI and HI.    Intervention:  Health and behavior intervention    Symptoms:  Feeling less anxious.  Feeling less worried about the future.  Feeling parenting stress.  I provided mentalization aimed at reinforcing positive emotional coping    Treatment Plan/Recommendations: Weekly behavioral intervention

## 2021-09-09 ENCOUNTER — Ambulatory Visit: Payer: Medicare Other

## 2021-09-14 ENCOUNTER — Ambulatory Visit
Admission: RE | Admit: 2021-09-14 | Discharge: 2021-09-14 | Disposition: A | Discharge status: Home / Self Care | Payer: Medicare Other | Source: Ambulatory Visit

## 2021-09-14 DIAGNOSIS — F4321 Adjustment disorder with depressed mood: Secondary | ICD-10-CM

## 2021-09-14 NOTE — Progress Notes (Signed)
[facility]  Outpatient Neuro Rehabilitation Department  [address] [place] [address]  Phone: [phone]    Fax: [phone]                 NEUROPSYCHOLOGICAL PROGRESS NOTE      PATIENT: Jonathan [name] DOB: [birth year]   MRN: [id number]  AGE: 71 y.o.    Primary Language:English   Interpreter:         Referring Provider: Roland Rack, MD      Date of Service: 09/14/2021       General Observations:    [name] appeared alert and oriented to person, place, and time.  [name] maintained eye contact and presented as cooperative and engaged throughout the course of the meeting.  Affect was appropriate for the situation.  Thought processes were logical and goal directed.  Expressive speech was fluent.  Conversational speech was intact.  There was no evidence of psychosis or delusions.  [name] explicitly denied SI and HI.    Intervention:  Health and behavior intervention    Symptoms:  sadness and anxiety  I provided cognitive reframing aimed at facilitating the grieving process    Treatment Plan/Recommendations: Weekly behavioral intervention

## 2021-09-21 ENCOUNTER — Ambulatory Visit: Payer: Medicare Other

## 2021-09-28 ENCOUNTER — Ambulatory Visit
Admission: RE | Admit: 2021-09-28 | Discharge: 2021-09-28 | Disposition: A | Discharge status: Home / Self Care | Payer: Medicare Other | Source: Ambulatory Visit

## 2021-09-28 DIAGNOSIS — F4321 Adjustment disorder with depressed mood: Secondary | ICD-10-CM

## 2021-09-28 NOTE — Progress Notes (Signed)
[facility]  Outpatient Neuro Rehabilitation Department  [address] [place] [address]  Phone: [phone]    Fax: [phone]                 NEUROPSYCHOLOGICAL PROGRESS NOTE      PATIENT: Jonathan [name] DOB: [birth year]   MRN: [id number]  AGE: 71 y.o.    Primary Language:English   Interpreter:         Referring Provider: Roland Rack, MD      Date of Service: 09/28/2021       General Observations:    [name] appeared alert and oriented to person, place, and time.  [name] maintained eye contact and presented as cooperative and engaged throughout the course of the meeting.  Affect was appropriate for the situation.  Thought processes were logical and goal directed.  Expressive speech was fluent.  Conversational speech was intact.  There was no evidence of psychosis or delusions.  [name] explicitly denied SI and HI.    Intervention:  Health and behavior intervention    Symptoms:  Improving sadness.  He noted still feeling depressed, but not overwhelmed by it.   I provided cognitive reframing aimed at enhancing coping skills.    Treatment Plan/Recommendations: Weekly behavioral intervention

## 2021-10-05 ENCOUNTER — Ambulatory Visit
Admission: RE | Admit: 2021-10-05 | Discharge: 2021-10-05 | Disposition: A | Discharge status: Home / Self Care | Payer: Medicare Other | Source: Ambulatory Visit | Attending: Specialist | Admitting: Specialist

## 2021-10-05 DIAGNOSIS — X58XXXS Exposure to other specified factors, sequela: Secondary | ICD-10-CM | POA: Insufficient documentation

## 2021-10-05 DIAGNOSIS — R278 Other lack of coordination: Secondary | ICD-10-CM | POA: Insufficient documentation

## 2021-10-05 DIAGNOSIS — F4321 Adjustment disorder with depressed mood: Secondary | ICD-10-CM | POA: Insufficient documentation

## 2021-10-05 DIAGNOSIS — H5330 Unspecified disorder of binocular vision: Secondary | ICD-10-CM | POA: Insufficient documentation

## 2021-10-05 DIAGNOSIS — S062X1S Diffuse traumatic brain injury with loss of consciousness of 30 minutes or less, sequela: Secondary | ICD-10-CM | POA: Insufficient documentation

## 2021-10-05 DIAGNOSIS — R41844 Frontal lobe and executive function deficit: Secondary | ICD-10-CM | POA: Insufficient documentation

## 2021-10-05 NOTE — Progress Notes (Signed)
[facility]  Outpatient Neuro Rehabilitation Department  [address], [place] [address]  Phone: [phone]    Fax: [phone]                 NEUROPSYCHOLOGICAL PROGRESS NOTE      PATIENT: Jonathan [name] DOB: 13-Jul-1951   MRN: [id number]  AGE: 71 y.o.    Primary Language:English   Interpreter:         Referring Provider: Roland Rack, MD      Date of Service: 10/05/2021       General Observations:    Jonathan [name] appeared alert and oriented to person, place, and time.  Jonathan [name] maintained eye contact and presented as cooperative and engaged throughout the course of the meeting.  Affect was appropriate for the situation.  Thought processes were logical and goal directed.  Expressive speech was fluent.  Conversational speech was intact.  There was no evidence of psychosis or delusions.  Jonathan [name] explicitly denied SI and HI.    Intervention:  Health and behavior intervention    Symptoms:  sadness  I provided cognitive reframing aimed at facilitating the grieving process    Treatment Plan/Recommendations: Weekly behavioral intervention

## 2021-10-12 ENCOUNTER — Ambulatory Visit
Admission: RE | Admit: 2021-10-12 | Discharge: 2021-10-12 | Disposition: A | Discharge status: Home / Self Care | Payer: Medicare Other | Source: Ambulatory Visit

## 2021-10-12 DIAGNOSIS — F4321 Adjustment disorder with depressed mood: Secondary | ICD-10-CM

## 2021-10-12 NOTE — Progress Notes (Signed)
[facility]  Outpatient Neuro Rehabilitation Department  [address] [place] [address]  Phone: [phone]    Fax: [phone]                 NEUROPSYCHOLOGICAL PROGRESS NOTE      PATIENT: KHOI HAMBERGER DOB: 06-Jun-1951   MRN: [id number]  AGE: 71 y.o.    Primary Language:English   Interpreter:         Referring Provider: Roland Rack, MD      Date of Service: 10/12/2021       General Observations:    [name] appeared alert and oriented to person, place, and time.  [name] maintained eye contact and presented as cooperative and engaged throughout the course of the meeting.  Affect was appropriate for the situation.  Thought processes were logical and goal directed.  Expressive speech was fluent.  Conversational speech was intact.  There was no evidence of psychosis or delusions.  [name] explicitly denied SI and HI.    Intervention:  Health and behavior intervention    Symptoms:  sadness  I provided positive reframing aimed at enhancing coping skills.    Treatment Plan/Recommendations: Weekly behavioral intervention

## 2021-10-19 ENCOUNTER — Ambulatory Visit
Admission: RE | Admit: 2021-10-19 | Discharge: 2021-10-19 | Disposition: A | Discharge status: Home / Self Care | Payer: Medicare Other | Source: Ambulatory Visit

## 2021-10-19 DIAGNOSIS — F4321 Adjustment disorder with depressed mood: Secondary | ICD-10-CM

## 2021-10-19 NOTE — Progress Notes (Signed)
[facility]  Outpatient Neuro Rehabilitation Department  [address] [place] [address]  Phone: [phone]    Fax: [phone]                 NEUROPSYCHOLOGICAL PROGRESS NOTE      PATIENT: Jonathan [name] DOB: [birth year]   MRN: [id number]  AGE: 71 y.o.    Primary Language:English   Interpreter:         Referring Provider: Roland Rack, MD      Date of Service: 10/19/2021       General Observations:    [name] appeared alert and oriented to person, place, and time.  [name] maintained eye contact and presented as cooperative and engaged throughout the course of the meeting.  Affect was appropriate for the situation.  Thought processes were logical and goal directed.  Expressive speech was fluent.  Conversational speech was intact.  There was no evidence of psychosis or delusions.  [name] explicitly denied SI and HI.    Intervention:  Health and behavior intervention    Symptoms:  sadness  I provided cognitive reframing targeted at establishing a positive view of self    Treatment Plan/Recommendations: Weekly behavioral intervention

## 2021-10-26 ENCOUNTER — Ambulatory Visit
Admission: RE | Admit: 2021-10-26 | Discharge: 2021-10-26 | Disposition: A | Discharge status: Home / Self Care | Payer: Medicare Other | Source: Ambulatory Visit

## 2021-10-26 DIAGNOSIS — F4321 Adjustment disorder with depressed mood: Secondary | ICD-10-CM

## 2021-10-26 NOTE — Progress Notes (Signed)
[facility]  Outpatient Neuro Rehabilitation Department  [address] [place] [address]  Phone: [phone]    Fax: [phone]                 NEUROPSYCHOLOGICAL PROGRESS NOTE      PATIENT: Jonathan [name] DOB: 13-May-1951   MRN: [id number]  AGE: 71 y.o.    Primary Language:English   Interpreter:         Referring Provider: Roland Rack, MD      Date of Service: 10/26/2021       General Observations:    Jonathan [name] appeared alert and oriented to person, place, and time.  Jonathan [name] maintained eye contact and presented as cooperative and engaged throughout the course of the meeting.  Affect was appropriate for the situation.  Thought processes were logical and goal directed.  Expressive speech was fluent.  Conversational speech was intact.  There was no evidence of psychosis or delusions.  Jonathan [name] explicitly denied SI and HI.    Intervention:  Health and behavior intervention    Symptoms:  Low self-regard, self-criticism, sadness  I provided mentalization targeted at establishing a positive view of self    Treatment Plan/Recommendations: Weekly behavioral intervention

## 2021-11-02 ENCOUNTER — Ambulatory Visit
Admission: RE | Admit: 2021-11-02 | Discharge: 2021-11-02 | Disposition: A | Discharge status: Home / Self Care | Payer: Medicare Other | Source: Ambulatory Visit | Attending: Specialist | Admitting: Specialist

## 2021-11-02 DIAGNOSIS — F4321 Adjustment disorder with depressed mood: Secondary | ICD-10-CM | POA: Insufficient documentation

## 2021-11-02 DIAGNOSIS — H5330 Unspecified disorder of binocular vision: Secondary | ICD-10-CM | POA: Insufficient documentation

## 2021-11-02 DIAGNOSIS — R278 Other lack of coordination: Secondary | ICD-10-CM | POA: Insufficient documentation

## 2021-11-02 DIAGNOSIS — R41844 Frontal lobe and executive function deficit: Secondary | ICD-10-CM | POA: Insufficient documentation

## 2021-11-02 DIAGNOSIS — S062X1S Diffuse traumatic brain injury with loss of consciousness of 30 minutes or less, sequela: Secondary | ICD-10-CM | POA: Insufficient documentation

## 2021-11-02 DIAGNOSIS — X58XXXS Exposure to other specified factors, sequela: Secondary | ICD-10-CM | POA: Insufficient documentation

## 2021-11-02 NOTE — Progress Notes (Signed)
[facility]  Outpatient Neuro Rehabilitation Department  [address] [place] [address]  Phone: [phone]    Fax: [phone]                 NEUROPSYCHOLOGICAL PROGRESS NOTE      PATIENT: Jonathan [name] DOB: Feb 21, 1951   MRN: [id number]  AGE: 71 y.o.    Primary Language:English   Interpreter:         Referring Provider: Roland Rack, MD      Date of Service: 11/02/2021       General Observations:    Jonathan [name] appeared alert and oriented to person, place, and time.  Jonathan [name] maintained eye contact and presented as cooperative and engaged throughout the course of the meeting.  Affect was appropriate for the situation.  Thought processes were logical and goal directed.  Expressive speech was fluent.  Conversational speech was intact.  There was no evidence of psychosis or delusions.  Jonathan [name] explicitly denied SI and HI.    Intervention:  Health and behavior intervention    Symptoms:  sadness  I provided cognitive reframing aimed at enhancing coping skills.    Treatment Plan/Recommendations: Weekly behavioral intervention

## 2021-11-09 ENCOUNTER — Ambulatory Visit
Admission: RE | Admit: 2021-11-09 | Discharge: 2021-11-09 | Disposition: A | Discharge status: Home / Self Care | Payer: Medicare Other | Source: Ambulatory Visit

## 2021-11-09 DIAGNOSIS — F4321 Adjustment disorder with depressed mood: Secondary | ICD-10-CM

## 2021-11-09 NOTE — Progress Notes (Signed)
[facility]  Outpatient Neuro Rehabilitation Department  [address] [place] [address]  Phone: [phone]    Fax: [phone]                 NEUROPSYCHOLOGICAL PROGRESS NOTE      PATIENT: Jonathan [name] DOB: 03-02-1951   MRN: [id number]  AGE: 71 y.o.    Primary Language:English   Interpreter:         Referring Provider: Roland Rack, MD      Date of Service: 11/09/2021       General Observations:    [name] appeared alert and oriented to person, place, and time.  [name] maintained eye contact and presented as cooperative and engaged throughout the course of the meeting.  Affect was appropriate for the situation.  Thought processes were logical and goal directed.  Expressive speech was fluent.  Conversational speech was intact.  There was no evidence of psychosis or delusions.  [name] explicitly denied SI and HI.    Intervention:  Health and behavior intervention    Symptoms:  sadness  I provided cognitive reframing aimed at enhancing coping skills    Treatment Plan/Recommendations: Weekly behavioral intervention

## 2021-11-16 ENCOUNTER — Ambulatory Visit: Payer: Medicare Other

## 2021-11-23 ENCOUNTER — Ambulatory Visit: Payer: Medicare Other

## 2021-11-30 ENCOUNTER — Ambulatory Visit
Admission: RE | Admit: 2021-11-30 | Discharge: 2021-11-30 | Disposition: A | Discharge status: Home / Self Care | Payer: Medicare Other | Source: Ambulatory Visit | Attending: Specialist | Admitting: Specialist

## 2021-11-30 DIAGNOSIS — X58XXXS Exposure to other specified factors, sequela: Secondary | ICD-10-CM | POA: Insufficient documentation

## 2021-11-30 DIAGNOSIS — S062X1S Diffuse traumatic brain injury with loss of consciousness of 30 minutes or less, sequela: Secondary | ICD-10-CM | POA: Insufficient documentation

## 2021-11-30 DIAGNOSIS — H5330 Unspecified disorder of binocular vision: Secondary | ICD-10-CM | POA: Insufficient documentation

## 2021-11-30 DIAGNOSIS — R41844 Frontal lobe and executive function deficit: Secondary | ICD-10-CM | POA: Insufficient documentation

## 2021-11-30 DIAGNOSIS — R278 Other lack of coordination: Secondary | ICD-10-CM | POA: Insufficient documentation

## 2021-11-30 DIAGNOSIS — F4321 Adjustment disorder with depressed mood: Secondary | ICD-10-CM | POA: Insufficient documentation

## 2021-11-30 NOTE — Progress Notes (Signed)
[facility]  Outpatient Neuro Rehabilitation Department  [address] [place] [address]  Phone: [phone]    Fax: [phone]                 NEUROPSYCHOLOGICAL PROGRESS NOTE      PATIENT: Jonathan [name] DOB: April 11, 1951   MRN: [id number]  AGE: 71 y.o.    Primary Language:English   Interpreter:         Referring Provider: Roland Rack, MD      Date of Service: 11/30/2021       General Observations:    Jonathan [name] appeared alert and oriented to person, place, and time.  Jonathan [name] maintained eye contact and presented as cooperative and engaged throughout the course of the meeting.  Affect was appropriate for the situation.  Thought processes were logical and goal directed.  Expressive speech was fluent.  Conversational speech was intact.  There was no evidence of psychosis or delusions.  Jonathan [name] explicitly denied SI and HI.    Intervention:  Health and behavior intervention    Symptoms:  sadness  I provided cognitive reframing aimed at establishing a positive view of self and future    Treatment Plan/Recommendations: Weekly behavioral intervention

## 2021-12-07 ENCOUNTER — Ambulatory Visit
Admission: RE | Admit: 2021-12-07 | Discharge: 2021-12-07 | Disposition: A | Discharge status: Home / Self Care | Payer: Medicare Other | Source: Ambulatory Visit

## 2021-12-07 DIAGNOSIS — F4321 Adjustment disorder with depressed mood: Secondary | ICD-10-CM

## 2021-12-07 NOTE — Progress Notes (Signed)
[facility]  Outpatient Neuro Rehabilitation Department  [address], [place] [address]  Phone: [phone]    Fax: [phone]                 NEUROPSYCHOLOGICAL PROGRESS NOTE      PATIENT: Jonathan [name] DOB: September 14, 1950   MRN: [id number]  AGE: 71 y.o.    Primary Language:English   Interpreter:         Referring Provider: Roland Rack, MD      Date of Service: 12/07/2021       General Observations:    Jonathan [name] appeared alert and oriented to person, place, and time.  Jonathan [name] maintained eye contact and presented as cooperative and engaged throughout the course of the meeting.  Affect was appropriate for the situation.  Thought processes were logical and goal directed.  Expressive speech was fluent.  Conversational speech was intact.  There was no evidence of psychosis or delusions.  Jonathan [name] explicitly denied SI and HI.    Intervention:  Health and behavior intervention    Symptoms:  Anxiety about memory problems  I provided education about memory disorders aimed at enhancing coping skills.    Treatment Plan/Recommendations: Weekly behavioral intervention

## 2021-12-14 ENCOUNTER — Ambulatory Visit
Admission: RE | Admit: 2021-12-14 | Discharge: 2021-12-14 | Disposition: A | Discharge status: Home / Self Care | Payer: Medicare Other | Source: Ambulatory Visit

## 2021-12-14 DIAGNOSIS — F4321 Adjustment disorder with depressed mood: Secondary | ICD-10-CM

## 2021-12-14 NOTE — Progress Notes (Signed)
[facility]  Outpatient Neuro Rehabilitation Department  [address] [place] [address]  Phone: [phone]    Fax: [phone]                 NEUROPSYCHOLOGICAL PROGRESS NOTE      PATIENT: Jonathan [name] DOB: [birth year]   MRN: [id number]  AGE: 71 y.o.    Primary Language:English   Interpreter:         Referring Provider: Roland Rack, MD      Date of Service: 12/14/2021       General Observations:    [name] appeared alert and oriented to person, place, and time.  [name] maintained eye contact and presented as cooperative and engaged throughout the course of the meeting.  Affect was appropriate for the situation.  Thought processes were logical and goal directed.  Expressive speech was fluent.  Conversational speech was intact.  There was no evidence of psychosis or delusions.  [name] explicitly denied SI and HI.    Intervention:  Health and behavior intervention    Symptoms:  sadness  I provided cognitive reframing aimed at establishing a positive view of self    Treatment Plan/Recommendations: Weekly behavioral intervention

## 2021-12-21 ENCOUNTER — Ambulatory Visit
Admission: RE | Admit: 2021-12-21 | Discharge: 2021-12-21 | Disposition: A | Discharge status: Home / Self Care | Payer: Medicare Other | Source: Ambulatory Visit

## 2021-12-21 DIAGNOSIS — F4321 Adjustment disorder with depressed mood: Secondary | ICD-10-CM

## 2021-12-21 NOTE — Progress Notes (Signed)
[facility]  Outpatient Neuro Rehabilitation Department  [address] [place] [address]  Phone: [phone]    Fax: [phone]                 NEUROPSYCHOLOGICAL PROGRESS NOTE      PATIENT: [name] DOB: August 06, 1950   MRN: [id number]  AGE: 71 y.o.    Primary Language:English   Interpreter:         Referring Provider: Roland Rack, MD      Date of Service: 12/21/2021       General Observations:    [name] appeared alert and oriented to person, place, and time.  [name] maintained eye contact and presented as cooperative and engaged throughout the course of the meeting.  Affect was appropriate for the situation.  Thought processes were logical and goal directed.  Expressive speech was fluent.  Conversational speech was intact.  There was no evidence of psychosis or delusions.  [name] explicitly denied SI and HI.    Intervention:  Health and behavior intervention    Symptoms:  sadness  I provided cognitive reframing aimed at facilitating the grieving process    Treatment Plan/Recommendations: Weekly behavioral intervention

## 2021-12-28 ENCOUNTER — Ambulatory Visit: Payer: Medicare Other

## 2022-01-04 ENCOUNTER — Ambulatory Visit: Payer: Medicare Other

## 2022-01-11 ENCOUNTER — Ambulatory Visit
Admission: RE | Admit: 2022-01-11 | Discharge: 2022-01-11 | Disposition: A | Discharge status: Home / Self Care | Payer: Medicare Other | Source: Ambulatory Visit | Attending: Specialist | Admitting: Specialist

## 2022-01-11 DIAGNOSIS — X58XXXS Exposure to other specified factors, sequela: Secondary | ICD-10-CM | POA: Insufficient documentation

## 2022-01-11 DIAGNOSIS — R41844 Frontal lobe and executive function deficit: Secondary | ICD-10-CM | POA: Insufficient documentation

## 2022-01-11 DIAGNOSIS — H5330 Unspecified disorder of binocular vision: Secondary | ICD-10-CM | POA: Insufficient documentation

## 2022-01-11 DIAGNOSIS — R278 Other lack of coordination: Secondary | ICD-10-CM | POA: Insufficient documentation

## 2022-01-11 DIAGNOSIS — F4321 Adjustment disorder with depressed mood: Secondary | ICD-10-CM | POA: Insufficient documentation

## 2022-01-11 DIAGNOSIS — S062X1S Diffuse traumatic brain injury with loss of consciousness of 30 minutes or less, sequela: Secondary | ICD-10-CM | POA: Insufficient documentation

## 2022-01-11 NOTE — Progress Notes (Signed)
[facility]  Outpatient Neuro Rehabilitation Department  [address], [place] [address]  Phone: [phone]    Fax: [phone]                 NEUROPSYCHOLOGICAL PROGRESS NOTE      PATIENT: Jonathan [name] DOB: [birth year]   MRN: [id number]  AGE: 71 y.o.    Primary Language:English   Interpreter:         Referring Provider: Roland Rack, MD      Date of Service: 01/11/2022       General Observations:    [name] appeared alert and oriented to person, place, and time.  [name] maintained eye contact and presented as cooperative and engaged throughout the course of the meeting.  Affect was appropriate for the situation.  Thought processes were logical and goal directed.  Expressive speech was fluent.  Conversational speech was intact.  There was no evidence of psychosis or delusions.  [name] explicitly denied SI and HI.    Intervention:  Health and behavior intervention    Symptoms:  Sadness, tearfulness, and self-criticism  I provided cognitive reframing and supportive statements aimed at enhancing a positive view of self    Treatment Plan/Recommendations:  Weekly behavioral intervention

## 2022-01-12 ENCOUNTER — Ambulatory Visit (INDEPENDENT_AMBULATORY_CARE_PROVIDER_SITE_OTHER): Payer: Medicare Other | Admitting: Cardiology

## 2022-01-12 ENCOUNTER — Encounter (INDEPENDENT_AMBULATORY_CARE_PROVIDER_SITE_OTHER): Payer: Self-pay | Admitting: Cardiology

## 2022-01-12 VITALS — BP 147/82 | HR 52 | Resp 18 | Ht 74.0 in | Wt 198.2 lb

## 2022-01-12 DIAGNOSIS — I482 Chronic atrial fibrillation, unspecified: Secondary | ICD-10-CM

## 2022-01-12 LAB — ECG 12-LEAD
Atrial Rate: 45 {beats}/min
Q-T Interval: 552 ms
QRS Duration: 162 ms
QTC Calculation (Bezet): 488 ms
R Axis: 29 degrees
T Axis: 123 degrees
Ventricular Rate: 47 {beats}/min

## 2022-01-12 NOTE — Progress Notes (Signed)
Jonathan [name] 71 y.o. with a history of atrial fibrillation with slow ventricular response presents for cardiac follow-up.    He has history of atrial fibrillation with slow ventricular spots.  He underwent leadless pacemaker and 2015.  The device malfunctioned, it was recalled.  It was recommended to get replaced, but patient refused.  Unable to interrogate the pacemaker.  He did see EP ( Dr Lujean Amel) in the past, but subsequently he transferred care to another electrophysiologist in [place].  Recently he is noted his heart rate has been low at home on his Apple Watch.  He contacted his primary electrophysiologist and a Zio patch was placed.  Patient denies syncope.  Patient self stopped his Coumadin without the recommendation of a medical professional.    He denies syncope or chest pain.        Current Outpatient Medications   Medication Sig Dispense Refill    ALPRAZolam (XANAX) 0.5 MG tablet TAKE 1 TABLET (0.5 MG) BY ORAL ROUTE 3 TIMES PER DAY AS NEEDED      Melatonin 10 MG Cap Take 1 capsule (10 mg) by mouth daily      sertraline (ZOLOFT) 100 MG tablet Take 1 tablet (100 mg) by mouth daily      busPIRone (BUSPAR) 15 MG tablet Take 1 tablet (15 mg) by mouth 2 (two) times daily (Patient not taking: Reported on 01/12/2022)       No current facility-administered medications for this visit.        PE:    Vitals:    01/12/22 0923   BP: 147/82   Pulse: (!) 52   Resp: 18   SpO2: 98%     Body mass index is 25.45 kg/m.    Physical Examination: General appearance - alert, well appearing, and in no distress  Chest - clear to auscultation, no wheezes, rales or rhonchi, symmetric air entry  Heart - irregularly irregular rhythm with rate   Abdomen - nontender  Extremities - no pedal edema noted     EKG:    Intermittently ventricular pacing, atrial fibrillation    Labs:  Lipid Panel   Cholesterol   Date/Time Value Ref Range Status   11/28/2017 01:20 PM 137 0 - 199 mg/dL Final     Triglycerides   Date/Time Value Ref Range  Status   11/28/2017 01:20 PM 67 34 - 149 mg/dL Final     HDL   Date/Time Value Ref Range Status   11/28/2017 01:20 PM 35 (L) 40 - 9,999 mg/dL Final     Comment:     An HDL cholesterol <40 mg/dL is low and constitutes a  coronary heart disease risk factor, and HDL-C>59 mg/dL is  a negative risk factor for CHD.  Ref: American Heart Association; Circulation 2004         CMP:   Sodium   Date/Time Value Ref Range Status   10/31/2019 03:50 PM 136 136 - 145 mEq/L Final     Potassium   Date/Time Value Ref Range Status   10/31/2019 03:50 PM 4.0 3.5 - 5.1 mEq/L Final     Chloride   Date/Time Value Ref Range Status   10/31/2019 03:50 PM 99 (L) 100 - 111 mEq/L Final     CO2   Date/Time Value Ref Range Status   10/31/2019 03:50 PM 27 21 - 29 mEq/L Final     Glucose   Date/Time Value Ref Range Status   10/31/2019 03:50 PM 95 70 - 100 mg/dL Final  Comment:     ADA guidelines for diabetes mellitus:  Fasting:  Equal to or greater than 126 mg/dL  Random:   Equal to or greater than 200 mg/dL       BUN   Date/Time Value Ref Range Status   10/31/2019 03:50 PM 11.0 9.0 - 28.0 mg/dL Final     Protein, Total   Date/Time Value Ref Range Status   10/31/2019 03:50 PM 7.5 6.0 - 8.3 g/dL Final     Alkaline Phosphatase   Date/Time Value Ref Range Status   10/31/2019 03:50 PM 103 38 - 106 U/L Final     AST (SGOT)   Date/Time Value Ref Range Status   10/31/2019 03:50 PM 48 (H) 5 - 34 U/L Final     ALT   Date/Time Value Ref Range Status   10/31/2019 03:50 PM 20 0 - 55 U/L Final     Anion Gap   Date/Time Value Ref Range Status   10/31/2019 03:50 PM 10.0 5.0 - 15.0 Final     Comment:     Calculated AGAP = Na - (CL + CO2)  Interpret with caution; calculated AGAP may not reflect patient's  true clinical status.         CBC:   WBC   Date/Time Value Ref Range Status   03/20/2019 06:11 AM 7.18 3.10 - 9.50 x10 3/uL Final     RBC   Date/Time Value Ref Range Status   03/20/2019 06:11 AM 4.00 (L) 4.20 - 5.90 x10 6/uL Final     Hgb   Date/Time Value Ref  Range Status   03/20/2019 06:11 AM 11.7 (L) 12.5 - 17.1 g/dL Final     Hematocrit   Date/Time Value Ref Range Status   03/20/2019 06:11 AM 35.0 (L) 37.6 - 49.6 % Final     MCV   Date/Time Value Ref Range Status   03/20/2019 06:11 AM 87.5 78.0 - 96.0 fL Final     MCHC   Date/Time Value Ref Range Status   03/20/2019 06:11 AM 33.4 31.5 - 35.8 g/dL Final     RDW   Date/Time Value Ref Range Status   03/20/2019 06:11 AM 14 11 - 15 % Final     Platelets   Date/Time Value Ref Range Status   03/20/2019 06:11 AM 355 (H) 142 - 346 x10 3/uL Final           Impression /plan    Atrial fibrillation with slow ventricular response: I recommended immediate follow-up with his electrophysiologist.  He does have a Zio patch on.  Further recommendation per his primary cardiologist.  Patient self stopped his Coumadin without discussion with a medical professional.  We will obtain echocardiogram      Nelda Bucks, MD  01/12/2022

## 2022-01-18 ENCOUNTER — Ambulatory Visit
Admission: RE | Admit: 2022-01-18 | Discharge: 2022-01-18 | Disposition: A | Discharge status: Home / Self Care | Payer: Medicare Other | Source: Ambulatory Visit

## 2022-01-18 DIAGNOSIS — F4321 Adjustment disorder with depressed mood: Secondary | ICD-10-CM

## 2022-01-18 NOTE — Progress Notes (Signed)
[facility]  Outpatient Neuro Rehabilitation Department  [address] [place] [address]  Phone: [phone]    Fax: [phone]                 NEUROPSYCHOLOGICAL PROGRESS NOTE      PATIENT: Jonathan [name] DOB: May 03, 1951   MRN: [id number]  AGE: 71 y.o.    Primary Language:English   Interpreter:         Referring Provider: Roland Rack, MD      Date of Service: 01/18/2022       General Observations:    Jonathan [name] appeared alert and oriented to person, place, and time.  Jonathan [name] maintained eye contact and presented as cooperative and engaged throughout the course of the meeting.  Affect was appropriate for the situation.  Thought processes were logical and goal directed.  Expressive speech was fluent.  Conversational speech was intact.  There was no evidence of psychosis or delusions.  Jonathan [name] explicitly denied SI and HI.    Intervention:  Health and behavior intervention    Symptoms:  sadness and tearfulness  I provided cognitive reframing and mentalization aimed enhancing a positive view of self and others    Treatment Plan/Recommendations: Weekly behavioral intervention

## 2022-02-15 ENCOUNTER — Ambulatory Visit: Payer: Medicare Other

## 2022-02-22 ENCOUNTER — Ambulatory Visit
Admission: RE | Admit: 2022-02-22 | Discharge: 2022-02-22 | Disposition: A | Discharge status: Home / Self Care | Payer: Medicare Other | Source: Ambulatory Visit | Attending: Specialist | Admitting: Specialist

## 2022-02-22 DIAGNOSIS — H5330 Unspecified disorder of binocular vision: Secondary | ICD-10-CM | POA: Insufficient documentation

## 2022-02-22 DIAGNOSIS — X58XXXS Exposure to other specified factors, sequela: Secondary | ICD-10-CM | POA: Insufficient documentation

## 2022-02-22 DIAGNOSIS — R41844 Frontal lobe and executive function deficit: Secondary | ICD-10-CM | POA: Insufficient documentation

## 2022-02-22 DIAGNOSIS — R278 Other lack of coordination: Secondary | ICD-10-CM | POA: Insufficient documentation

## 2022-02-22 DIAGNOSIS — S062X1S Diffuse traumatic brain injury with loss of consciousness of 30 minutes or less, sequela: Secondary | ICD-10-CM | POA: Insufficient documentation

## 2022-02-22 DIAGNOSIS — F4321 Adjustment disorder with depressed mood: Secondary | ICD-10-CM | POA: Insufficient documentation

## 2022-02-22 NOTE — Progress Notes (Signed)
[facility]  Outpatient Neuro Rehabilitation Department  [address], [place] [address]  Phone: [phone]    Fax: [phone]                 NEUROPSYCHOLOGICAL PROGRESS NOTE      PATIENT: Jonathan [name] DOB: [birth year]   MRN: [id number]  AGE: 71 y.o.    Primary Language:English   Interpreter:         Referring Provider: Roland Rack, MD      Date of Service: 02/22/2022       General Observations:    Jonathan [name] appeared alert and oriented to person, place, and time.  Jonathan [name] maintained eye contact and presented as cooperative and engaged throughout the course of the meeting.  Affect was appropriate for the situation.  Thought processes were logical and goal directed.  Expressive speech was fluent.  Conversational speech was intact.  There was no evidence of psychosis or delusions.  Jonathan [name] explicitly denied SI and HI.    Intervention:  Health and behavior intervention    Symptoms:  sadness and tearfulness and worry  I provided cognitive reframing aimed at enhancing coping skills.    Treatment Plan/Recommendations: Weekly behavioral intervention

## 2022-03-01 ENCOUNTER — Ambulatory Visit: Payer: Medicare Other

## 2022-03-08 ENCOUNTER — Ambulatory Visit
Admission: RE | Admit: 2022-03-08 | Discharge: 2022-03-08 | Disposition: A | Discharge status: Home / Self Care | Payer: Medicare Other | Source: Ambulatory Visit | Attending: Specialist | Admitting: Specialist

## 2022-03-08 DIAGNOSIS — R278 Other lack of coordination: Secondary | ICD-10-CM | POA: Insufficient documentation

## 2022-03-08 DIAGNOSIS — R41844 Frontal lobe and executive function deficit: Secondary | ICD-10-CM | POA: Insufficient documentation

## 2022-03-08 DIAGNOSIS — X58XXXS Exposure to other specified factors, sequela: Secondary | ICD-10-CM | POA: Insufficient documentation

## 2022-03-08 DIAGNOSIS — S062X1S Diffuse traumatic brain injury with loss of consciousness of 30 minutes or less, sequela: Secondary | ICD-10-CM | POA: Insufficient documentation

## 2022-03-08 DIAGNOSIS — F4321 Adjustment disorder with depressed mood: Secondary | ICD-10-CM | POA: Insufficient documentation

## 2022-03-08 DIAGNOSIS — H5330 Unspecified disorder of binocular vision: Secondary | ICD-10-CM | POA: Insufficient documentation

## 2022-03-08 NOTE — Progress Notes (Signed)
[facility]  Outpatient Neuro Rehabilitation Department  [address] [place] [address]  Phone: [phone]    Fax: [phone]                 NEUROPSYCHOLOGICAL PROGRESS NOTE      PATIENT: Jonathan [name] DOB: [birth year]   MRN: [id number]  AGE: 71 y.o.    Primary Language:English   Interpreter:         Referring Provider: Roland Rack, MD      Date of Service: 03/08/2022       General Observations:    [name] appeared alert and oriented to person, place, and time.  [name] maintained eye contact and presented as cooperative and engaged throughout the course of the meeting.  Affect was appropriate for the situation.  Thought processes were logical and goal directed.  Expressive speech was fluent.  Conversational speech was intact.  There was no evidence of psychosis or delusions.  [name] explicitly denied SI and HI.    Intervention:  Health and behavior intervention    Symptoms:  sadness  I provided cognitive reframing aimed a enhancing a positive view of self    Treatment Plan/Recommendations: Weekly behavioral intervention

## 2022-03-14 ENCOUNTER — Ambulatory Visit (INDEPENDENT_AMBULATORY_CARE_PROVIDER_SITE_OTHER): Payer: Medicare Other | Admitting: Cardiology

## 2022-03-15 ENCOUNTER — Ambulatory Visit: Payer: Medicare Other

## 2022-03-22 ENCOUNTER — Ambulatory Visit
Admission: RE | Admit: 2022-03-22 | Discharge: 2022-03-22 | Disposition: A | Discharge status: Home / Self Care | Payer: Medicare Other | Source: Ambulatory Visit

## 2022-03-22 DIAGNOSIS — F4321 Adjustment disorder with depressed mood: Secondary | ICD-10-CM

## 2022-03-22 NOTE — Progress Notes (Signed)
[facility]  Outpatient Neuro Rehabilitation Department  [address] [place] [address]  Phone: [phone]    Fax: [phone]                 NEUROPSYCHOLOGICAL PROGRESS NOTE      PATIENT: DAQUANE AGUILAR DOB: March 20, 1951   MRN: [id number]  AGE: 71 y.o.    Primary Language:English   Interpreter:         Referring Provider: Roland Rack, MD      Date of Service: 03/22/2022       General Observations:    [name] appeared alert and oriented to person, place, and time.  [name] maintained eye contact and presented as cooperative and engaged throughout the course of the meeting.  Affect was appropriate for the situation.  Thought processes were logical and goal directed.  Expressive speech was fluent.  Conversational speech was intact.  There was no evidence of psychosis or delusions.  [name] explicitly denied SI and HI.    Intervention:  Health and behavior intervention    Symptoms:  sadness and tearfulness  I provided cognitive reframing aimed at facilitating the grieving process    Treatment Plan/Recommendations: Weekly behavioral intervention

## 2022-03-29 ENCOUNTER — Ambulatory Visit
Admission: RE | Admit: 2022-03-29 | Discharge: 2022-03-29 | Disposition: A | Discharge status: Home / Self Care | Payer: Medicare Other | Source: Ambulatory Visit

## 2022-03-29 DIAGNOSIS — F4321 Adjustment disorder with depressed mood: Secondary | ICD-10-CM

## 2022-03-29 NOTE — Progress Notes (Signed)
[facility]  Outpatient Neuro Rehabilitation Department  [address], [place] [address]  Phone: [phone]    Fax: [phone]                 NEUROPSYCHOLOGICAL PROGRESS NOTE      PATIENT: Jonathan [name] DOB: 01/26/1951   MRN: [id number]  AGE: 71 y.o.    Primary Language:English   Interpreter:         Referring Provider: Roland Rack, MD      Date of Service: 03/29/2022       General Observations:    [name] appeared alert and oriented to person, place, and time.  [name] maintained eye contact and presented as cooperative and engaged throughout the course of the meeting.  Affect was appropriate for the situation.  Thought processes were logical and goal directed.  Expressive speech was fluent.  Conversational speech was intact.  There was no evidence of psychosis or delusions.  [name] explicitly denied SI and HI.    Intervention:  Health and behavior intervention    Symptoms:  sadness  I provided cognitive reframing aimed at enhancing a positive view of self    Treatment Plan/Recommendations: Weekly behavioral intervention

## 2022-04-05 ENCOUNTER — Ambulatory Visit
Admission: RE | Admit: 2022-04-05 | Discharge: 2022-04-05 | Disposition: A | Discharge status: Home / Self Care | Payer: Medicare Other | Source: Ambulatory Visit | Attending: Specialist | Admitting: Specialist

## 2022-04-05 DIAGNOSIS — R41844 Frontal lobe and executive function deficit: Secondary | ICD-10-CM | POA: Insufficient documentation

## 2022-04-05 DIAGNOSIS — F4321 Adjustment disorder with depressed mood: Secondary | ICD-10-CM | POA: Insufficient documentation

## 2022-04-05 DIAGNOSIS — H5330 Unspecified disorder of binocular vision: Secondary | ICD-10-CM | POA: Insufficient documentation

## 2022-04-05 DIAGNOSIS — R278 Other lack of coordination: Secondary | ICD-10-CM | POA: Insufficient documentation

## 2022-04-05 DIAGNOSIS — X58XXXS Exposure to other specified factors, sequela: Secondary | ICD-10-CM | POA: Insufficient documentation

## 2022-04-05 DIAGNOSIS — S062X1S Diffuse traumatic brain injury with loss of consciousness of 30 minutes or less, sequela: Secondary | ICD-10-CM | POA: Insufficient documentation

## 2022-04-05 NOTE — Progress Notes (Signed)
[facility]  Outpatient Neuro Rehabilitation Department  [address], [place] [address]  Phone: [phone]    Fax: [phone]                 NEUROPSYCHOLOGICAL PROGRESS NOTE      PATIENT: Jonathan [name] DOB: 1950/12/28   MRN: [id number]  AGE: 71 y.o.    Primary Language:English   Interpreter:         Referring Provider: Roland Rack, MD      Date of Service: 04/05/2022       General Observations:    [name] appeared alert and oriented to person, place, and time.  [name] maintained eye contact and presented as cooperative and engaged throughout the course of the meeting.  Affect was appropriate for the situation.  Thought processes were logical and goal directed.  Expressive speech was fluent.  Conversational speech was intact.  There was no evidence of psychosis or delusions.  [name] explicitly denied SI and HI.    Intervention:  Health and behavior intervention    Symptoms:  sadness  I provided cognitive reframing aimed at establishing a positive view of self and reducing self-criticism    Treatment Plan/Recommendations: Weekly behavioral intervention

## 2022-04-12 ENCOUNTER — Ambulatory Visit
Admission: RE | Admit: 2022-04-12 | Discharge: 2022-04-12 | Disposition: A | Discharge status: Home / Self Care | Payer: Medicare Other | Source: Ambulatory Visit

## 2022-04-12 DIAGNOSIS — F4321 Adjustment disorder with depressed mood: Secondary | ICD-10-CM

## 2022-04-12 NOTE — Progress Notes (Signed)
[facility]  Outpatient Neuro Rehabilitation Department  [address], [place] [address]  Phone: [phone]    Fax: [phone]                 NEUROPSYCHOLOGICAL PROGRESS NOTE      PATIENT: LENELL LAMA DOB: 1950-08-13   MRN: [id number]  AGE: 71 y.o.    Primary Language:English   Interpreter:         Referring Provider: Roland Rack, MD      Date of Service: 04/12/2022       General Observations:    [name] appeared alert and oriented to person, place, and time.  [name] maintained eye contact and presented as cooperative and engaged throughout the course of the meeting.  Affect was appropriate for the situation.  Thought processes were logical and goal directed.  Expressive speech was fluent.  Conversational speech was intact.  There was no evidence of psychosis or delusions.  [name] explicitly denied SI and HI.    Intervention:  Health and behavior intervention    Symptoms:  sadness  I provided cognitive reframing and normalization aimed at facilitating the grieving process    Treatment Plan/Recommendations: Weekly behavioral intervention

## 2022-04-19 ENCOUNTER — Ambulatory Visit
Admission: RE | Admit: 2022-04-19 | Discharge: 2022-04-19 | Disposition: A | Discharge status: Home / Self Care | Payer: Medicare Other | Source: Ambulatory Visit

## 2022-04-19 DIAGNOSIS — F4321 Adjustment disorder with depressed mood: Secondary | ICD-10-CM

## 2022-04-19 NOTE — Progress Notes (Signed)
[facility]  Outpatient Neuro Rehabilitation Department  [address] [place] [address]  Phone: [phone]    Fax: [phone]                 NEUROPSYCHOLOGICAL PROGRESS NOTE      PATIENT: NEYTHAN KOZLOV DOB: [birth year]   MRN: [id number]  AGE: 71 y.o.    Primary Language:English   Interpreter:         Referring Provider: Roland Rack, MD      Date of Service: 04/19/2022       General Observations:    [name] appeared alert and oriented to person, place, and time.  [name] maintained eye contact and presented as cooperative and engaged throughout the course of the meeting.  Affect was appropriate for the situation.  Thought processes were logical and goal directed.  Expressive speech was fluent.  Conversational speech was intact.  There was no evidence of psychosis or delusions.  [name] explicitly denied SI and HI.    Intervention:  Health and behavior intervention    Symptoms:  sadness  I provided cognitive reframing aimed at enhancing a balanced view of self    Treatment Plan/Recommendations: Weekly behavioral intervention

## 2022-04-26 ENCOUNTER — Ambulatory Visit: Payer: Medicare Other

## 2022-05-03 ENCOUNTER — Ambulatory Visit
Admission: RE | Admit: 2022-05-03 | Discharge: 2022-05-03 | Disposition: A | Discharge status: Home / Self Care | Payer: Medicare Other | Source: Ambulatory Visit | Attending: Specialist | Admitting: Specialist

## 2022-05-03 DIAGNOSIS — X58XXXS Exposure to other specified factors, sequela: Secondary | ICD-10-CM | POA: Insufficient documentation

## 2022-05-03 DIAGNOSIS — R278 Other lack of coordination: Secondary | ICD-10-CM | POA: Insufficient documentation

## 2022-05-03 DIAGNOSIS — H5330 Unspecified disorder of binocular vision: Secondary | ICD-10-CM | POA: Insufficient documentation

## 2022-05-03 DIAGNOSIS — S062X1S Diffuse traumatic brain injury with loss of consciousness of 30 minutes or less, sequela: Secondary | ICD-10-CM | POA: Insufficient documentation

## 2022-05-03 DIAGNOSIS — R41844 Frontal lobe and executive function deficit: Secondary | ICD-10-CM | POA: Insufficient documentation

## 2022-05-03 DIAGNOSIS — F4321 Adjustment disorder with depressed mood: Secondary | ICD-10-CM | POA: Insufficient documentation

## 2022-05-03 NOTE — Progress Notes (Signed)
[facility]  Outpatient Neuro Rehabilitation Department  [address] [place] [address]  Phone: [phone]    Fax: [phone]                 NEUROPSYCHOLOGICAL PROGRESS NOTE      PATIENT: Jonathan [name] DOB: 02/03/1951   MRN: [id number]  AGE: 71 y.o.    Primary Language:English   Interpreter:         Referring Provider: Roland Rack, MD      Date of Service: 05/03/2022       General Observations:    Jonathan [name] appeared alert and oriented to person, place, and time.  Jonathan [name] maintained eye contact and presented as cooperative and engaged throughout the course of the meeting.  Affect was appropriate for the situation.  Thought processes were logical and goal directed.  Expressive speech was fluent.  Conversational speech was intact.  There was no evidence of psychosis or delusions.  Jonathan [name] explicitly denied SI and HI.    Intervention:  Health and behavior intervention    Symptoms:  sadness  I provided cognitive reframing aimed at facilitating the grieving process    Treatment Plan/Recommendations: Weekly behavioral intervention

## 2022-05-10 ENCOUNTER — Ambulatory Visit
Admission: RE | Admit: 2022-05-10 | Discharge: 2022-05-10 | Disposition: A | Discharge status: Home / Self Care | Payer: Medicare Other | Source: Ambulatory Visit

## 2022-05-10 DIAGNOSIS — F4321 Adjustment disorder with depressed mood: Secondary | ICD-10-CM

## 2022-05-10 NOTE — Progress Notes (Signed)
[facility]  Outpatient Neuro Rehabilitation Department  [address], [place] [address]  Phone: [phone]    Fax: [phone]                 NEUROPSYCHOLOGICAL PROGRESS NOTE      PATIENT: GEOGE LAWRANCE DOB: 10/23/1950   MRN: [id number]  AGE: 71 y.o.    Primary Language:English   Interpreter:         Referring Provider: Roland Rack, MD      Date of Service: 05/10/2022       General Observations:    [name] appeared alert and oriented to person, place, and time.  [name] maintained eye contact and presented as cooperative and engaged throughout the course of the meeting.  Affect was appropriate for the situation.  Thought processes were logical and goal directed.  Expressive speech was fluent.  Conversational speech was intact.  There was no evidence of psychosis or delusions.  [name] explicitly denied SI and HI.    Intervention:  Health and behavior intervention    Symptoms:  Low self-regard, feeling inferior, feeling self-critical  I provided cognitive reframing aimed at establishing a positive view of self    Treatment Plan/Recommendations: Weekly behavioral intervention

## 2022-05-17 ENCOUNTER — Ambulatory Visit
Admission: RE | Admit: 2022-05-17 | Discharge: 2022-05-17 | Disposition: A | Discharge status: Home / Self Care | Payer: Medicare Other | Source: Ambulatory Visit

## 2022-05-17 DIAGNOSIS — F4321 Adjustment disorder with depressed mood: Secondary | ICD-10-CM

## 2022-05-17 NOTE — Progress Notes (Signed)
[facility]  Outpatient Neuro Rehabilitation Department  [address], [place] [address]  Phone: [phone]    Fax: [phone]                 NEUROPSYCHOLOGICAL PROGRESS NOTE      PATIENT: Jonathan [name] DOB: 1951/05/01   MRN: [id number]  AGE: 71 y.o.    Primary Language:English   Interpreter:         Referring Provider: Roland Rack, MD      Date of Service: 05/17/2022       General Observations:    [name] appeared alert and oriented to person, place, and time.  [name] maintained eye contact and presented as cooperative and engaged throughout the course of the meeting.  Affect was appropriate for the situation.  Thought processes were logical and goal directed.  Expressive speech was fluent.  Conversational speech was intact.  There was no evidence of psychosis or delusions.  [name] explicitly denied SI and HI.    Intervention:  Health and behavior intervention    Symptoms:  Feeling less depressed.  Feeling more active and social.  I provided cognitive reframing aimed at reinforcing positive coping    Treatment Plan/Recommendations: Weekly behavioral intervention

## 2022-05-24 ENCOUNTER — Ambulatory Visit
Admission: RE | Admit: 2022-05-24 | Discharge: 2022-05-24 | Disposition: A | Discharge status: Home / Self Care | Payer: Medicare Other | Source: Ambulatory Visit

## 2022-05-24 DIAGNOSIS — F4321 Adjustment disorder with depressed mood: Secondary | ICD-10-CM

## 2022-05-24 NOTE — Progress Notes (Signed)
[facility]  Outpatient Neuro Rehabilitation Department  [address], [place] [address]  Phone: [phone]    Fax: [phone]                 NEUROPSYCHOLOGICAL PROGRESS NOTE      PATIENT: Jonathan [name] DOB: 04/15/1951   MRN: [id number]  AGE: 72 y.o.    Primary Language:English   Interpreter:         Referring Provider: Roland Rack, MD      Date of Service: 05/24/2022       General Observations:    [name] appeared alert and oriented to person, place, and time.  [name] maintained eye contact and presented as cooperative and engaged throughout the course of the meeting.  Affect was appropriate for the situation.  Thought processes were logical and goal directed.  Expressive speech was fluent.  Conversational speech was intact.  There was no evidence of psychosis or delusions.  [name] explicitly denied SI and HI.    Intervention:  Health and behavior intervention    Symptoms:  Feeling less depressed.  Some mild self-criticism  I provided cognitive reframing aimed at reinforcing positive coping    Treatment Plan/Recommendations: Weekly behavioral intervention

## 2022-05-31 ENCOUNTER — Ambulatory Visit
Admission: RE | Admit: 2022-05-31 | Discharge: 2022-05-31 | Disposition: A | Discharge status: Home / Self Care | Payer: Medicare Other | Source: Ambulatory Visit

## 2022-05-31 DIAGNOSIS — F4321 Adjustment disorder with depressed mood: Secondary | ICD-10-CM

## 2022-05-31 NOTE — Progress Notes (Signed)
[facility]  Outpatient Neuro Rehabilitation Department  [address], [place] [address]  Phone: [phone]    Fax: [phone]                 NEUROPSYCHOLOGICAL PROGRESS NOTE      PATIENT: Jonathan [name] DOB: 1950/09/03   MRN: [id number]  AGE: 71 y.o.    Primary Language:English   Interpreter:         Referring Provider: Roland Rack, MD      Date of Service: 05/31/2022       General Observations:    Jonathan [name] appeared alert and oriented to person, place, and time.  Jonathan [name] maintained eye contact and presented as cooperative and engaged throughout the course of the meeting.  Affect was appropriate for the situation.  Thought processes were logical and goal directed.  Expressive speech was fluent.  Conversational speech was intact.  There was no evidence of psychosis or delusions.  Jonathan [name] explicitly denied SI and HI.    Intervention:  Health and behavior intervention    Symptoms:  Feeling less depressed.  Feeling more social.  I provided cognitive reframing aimed at enhancing coping skills.    Treatment Plan/Recommendations: Weekly behavioral intervention

## 2022-06-07 ENCOUNTER — Ambulatory Visit: Payer: Medicare Other

## 2022-06-14 ENCOUNTER — Ambulatory Visit
Admission: RE | Admit: 2022-06-14 | Discharge: 2022-06-14 | Disposition: A | Discharge status: Home / Self Care | Payer: Medicare Other | Source: Ambulatory Visit | Attending: Specialist | Admitting: Specialist

## 2022-06-14 DIAGNOSIS — X58XXXS Exposure to other specified factors, sequela: Secondary | ICD-10-CM | POA: Insufficient documentation

## 2022-06-14 DIAGNOSIS — F4321 Adjustment disorder with depressed mood: Secondary | ICD-10-CM | POA: Insufficient documentation

## 2022-06-14 DIAGNOSIS — S062X1S Diffuse traumatic brain injury with loss of consciousness of 30 minutes or less, sequela: Secondary | ICD-10-CM | POA: Insufficient documentation

## 2022-06-14 DIAGNOSIS — H5330 Unspecified disorder of binocular vision: Secondary | ICD-10-CM | POA: Insufficient documentation

## 2022-06-14 DIAGNOSIS — R278 Other lack of coordination: Secondary | ICD-10-CM | POA: Insufficient documentation

## 2022-06-14 DIAGNOSIS — R41844 Frontal lobe and executive function deficit: Secondary | ICD-10-CM | POA: Insufficient documentation

## 2022-06-14 NOTE — Progress Notes (Signed)
[facility]  Outpatient Neuro Rehabilitation Department  [address], [place] [address]  Phone: [phone]    Fax: [phone]                 NEUROPSYCHOLOGICAL PROGRESS NOTE      PATIENT: Jonathan [name] DOB: July 29, 1951   MRN: [id number]  AGE: 71 y.o.    Primary Language:English   Interpreter:         Referring Provider: Roland Rack, MD      Date of Service: 06/14/2022       General Observations:    [name] appeared alert and oriented to person, place, and time.  [name] maintained eye contact and presented as cooperative and engaged throughout the course of the meeting.  Affect was appropriate for the situation.  Thought processes were logical and goal directed.  Expressive speech was fluent.  Conversational speech was intact.  There was no evidence of psychosis or delusions.  [name] explicitly denied SI and HI.    Intervention:  Health and behavior intervention    Symptoms:  Improving sadness.  Some worry about having PD.  I provided normalization and active listening aimed at reinforcing positive coping    Treatment Plan/Recommendations: Weekly behavioral intervention

## 2022-06-21 ENCOUNTER — Ambulatory Visit: Payer: Medicare Other

## 2022-06-28 ENCOUNTER — Ambulatory Visit: Payer: Medicare Other

## 2022-07-05 ENCOUNTER — Ambulatory Visit
Admission: RE | Admit: 2022-07-05 | Discharge: 2022-07-05 | Disposition: A | Discharge status: Home / Self Care | Payer: Medicare Other | Source: Ambulatory Visit | Attending: Specialist | Admitting: Specialist

## 2022-07-05 DIAGNOSIS — S062X1S Diffuse traumatic brain injury with loss of consciousness of 30 minutes or less, sequela: Secondary | ICD-10-CM | POA: Insufficient documentation

## 2022-07-05 DIAGNOSIS — H5330 Unspecified disorder of binocular vision: Secondary | ICD-10-CM | POA: Insufficient documentation

## 2022-07-05 DIAGNOSIS — R41844 Frontal lobe and executive function deficit: Secondary | ICD-10-CM | POA: Insufficient documentation

## 2022-07-05 DIAGNOSIS — F4321 Adjustment disorder with depressed mood: Secondary | ICD-10-CM | POA: Insufficient documentation

## 2022-07-05 DIAGNOSIS — R278 Other lack of coordination: Secondary | ICD-10-CM | POA: Insufficient documentation

## 2022-07-05 DIAGNOSIS — X58XXXS Exposure to other specified factors, sequela: Secondary | ICD-10-CM | POA: Insufficient documentation

## 2022-07-05 NOTE — Progress Notes (Signed)
[facility]  Outpatient Neuro Rehabilitation Department  [address], [place] [address]  Phone: [phone]    Fax: [phone]                 NEUROPSYCHOLOGICAL PROGRESS NOTE      PATIENT: Jonathan [name] DOB: [birth year]   MRN: [id number]  AGE: 71 y.o.    Primary Language:English   Interpreter:         Referring Provider: Roland Rack, MD      Date of Service: 07/05/2022       General Observations:    Jonathan [name] appeared alert and oriented to person, place, and time.  Jonathan [name] maintained eye contact and presented as cooperative and engaged throughout the course of the meeting.  Affect was appropriate for the situation.  Thought processes were logical and goal directed.  Expressive speech was fluent.  Conversational speech was intact.  There was no evidence of psychosis or delusions.  Jonathan [name] explicitly denied SI and HI.    Intervention:  Health and behavior intervention    Symptoms:  sadness  I provided cognitive reframing aimed at enhancing coping skills.    Treatment Plan/Recommendations: Weekly  behavioral intervention

## 2022-07-12 ENCOUNTER — Ambulatory Visit
Admission: RE | Admit: 2022-07-12 | Discharge: 2022-07-12 | Disposition: A | Discharge status: Home / Self Care | Payer: Medicare Other | Source: Ambulatory Visit

## 2022-07-12 DIAGNOSIS — F4321 Adjustment disorder with depressed mood: Secondary | ICD-10-CM

## 2022-07-12 NOTE — Progress Notes (Signed)
[facility]  Outpatient Neuro Rehabilitation Department  [address] [place] [address]  Phone: [phone]    Fax: [phone]                 NEUROPSYCHOLOGICAL PROGRESS NOTE      PATIENT: Jonathan [name] DOB: Oct 31, 1950   MRN: [id number]  AGE: 71 y.o.    Primary Language:English   Interpreter:         Referring Provider: Roland Rack, MD      Date of Service: 07/12/2022       General Observations:    Jonathan [name] appeared alert and oriented to person, place, and time.  Jonathan [name] maintained eye contact and presented as cooperative and engaged throughout the course of the meeting.  Affect was appropriate for the situation.  Thought processes were logical and goal directed.  Expressive speech was fluent.  Conversational speech was intact.  There was no evidence of psychosis or delusions.  Jonathan [name] explicitly denied SI and HI.    Intervention:  Health and behavior intervention    Symptoms:  sadness and self-criticism  I provided cognitive reframing aimed at establishing a positive view of self    Treatment Plan/Recommendations: Weekly behavioral intervention

## 2022-07-19 ENCOUNTER — Ambulatory Visit
Admission: RE | Admit: 2022-07-19 | Discharge: 2022-07-19 | Disposition: A | Discharge status: Home / Self Care | Payer: Medicare Other | Source: Ambulatory Visit

## 2022-07-19 DIAGNOSIS — F4321 Adjustment disorder with depressed mood: Secondary | ICD-10-CM

## 2022-07-19 NOTE — Progress Notes (Signed)
[facility]  Outpatient Neuro Rehabilitation Department  [address] [place] [address]  Phone: [phone]    Fax: [phone]                 NEUROPSYCHOLOGICAL PROGRESS NOTE      PATIENT: Jonathan [name] DOB: [birth year]   MRN: [id number]  AGE: 71 y.o.    Primary Language:English   Interpreter:         Referring Provider: Roland Rack, MD      Date of Service: 07/19/2022       General Observations:    [name] appeared alert and oriented to person, place, and time.  [name] maintained eye contact and presented as cooperative and engaged throughout the course of the meeting.  Affect was appropriate for the situation.  Thought processes were logical and goal directed.  Expressive speech was fluent.  Conversational speech was intact.  There was no evidence of psychosis or delusions.  [name] explicitly denied SI and HI.    Intervention:  Health and behavior intervention    Symptoms:  sadness  I provided cognitive reframing aimed at enhancing coping skills.    Treatment Plan/Recommendations: Weekly behavioral intervention

## 2022-07-26 ENCOUNTER — Ambulatory Visit: Payer: Medicare Other

## 2022-08-16 ENCOUNTER — Ambulatory Visit
Admission: RE | Admit: 2022-08-16 | Discharge: 2022-08-16 | Disposition: A | Discharge status: Home / Self Care | Payer: Medicare Other | Source: Ambulatory Visit | Attending: Specialist | Admitting: Specialist

## 2022-08-16 DIAGNOSIS — X58XXXS Exposure to other specified factors, sequela: Secondary | ICD-10-CM | POA: Insufficient documentation

## 2022-08-16 DIAGNOSIS — F4321 Adjustment disorder with depressed mood: Secondary | ICD-10-CM | POA: Insufficient documentation

## 2022-08-16 DIAGNOSIS — R41844 Frontal lobe and executive function deficit: Secondary | ICD-10-CM | POA: Insufficient documentation

## 2022-08-16 DIAGNOSIS — S062X1S Diffuse traumatic brain injury with loss of consciousness of 30 minutes or less, sequela: Secondary | ICD-10-CM | POA: Insufficient documentation

## 2022-08-16 DIAGNOSIS — H5330 Unspecified disorder of binocular vision: Secondary | ICD-10-CM | POA: Insufficient documentation

## 2022-08-16 DIAGNOSIS — R278 Other lack of coordination: Secondary | ICD-10-CM | POA: Insufficient documentation

## 2022-08-16 NOTE — Progress Notes (Signed)
[facility] [place] [facility]  Outpatient Neuro Rehabilitation Department  [address], [place] [address]  Phone: [phone]    Fax: [phone]                 NEUROPSYCHOLOGICAL PROGRESS NOTE      PATIENT: Jonathan [name] DOB: February 09, 1951   MRN: [id number]  AGE: 72 y.o.    Primary [place]   Interpreter:         Referring Provider: Loyal Buba, MD      Date of Service: 08/16/2022       General Observations:    Jonathan [name] appeared alert and oriented to person, place, and time.  Jonathan [name] maintained eye contact and presented as cooperative and engaged throughout the course of the meeting.  Affect was appropriate for the situation.  Thought processes were logical and goal directed.  Expressive speech was fluent.  Conversational speech was intact.  There was no evidence of psychosis or delusions.  Jonathan [name] explicitly denied SI and HI.    Intervention:  Health and behavior intervention    Symptoms:  sadness  I provided cognitive reframing and reflective functioning aimed at establishing a positive view of self    Treatment Plan/Recommendations: Weekly behavioral intervention

## 2022-08-19 ENCOUNTER — Ambulatory Visit: Payer: Medicare Other

## 2022-08-23 ENCOUNTER — Ambulatory Visit
Admission: RE | Admit: 2022-08-23 | Discharge: 2022-08-23 | Disposition: A | Discharge status: Home / Self Care | Payer: Medicare Other | Source: Ambulatory Visit

## 2022-08-23 DIAGNOSIS — F4321 Adjustment disorder with depressed mood: Secondary | ICD-10-CM

## 2022-08-23 NOTE — Progress Notes (Signed)
[facility]  Outpatient Neuro Rehabilitation Department  [address] [place] [address]  Phone: [phone]    Fax: [phone]                 NEUROPSYCHOLOGICAL PROGRESS NOTE      PATIENT: Jonathan [name] DOB: 24-Dec-1950   MRN: [id number]  AGE: 72 y.o.    Primary [place]   Interpreter:         Referring Provider: Loyal Buba, MD      Date of Service: 08/23/2022       General Observations:    Jonathan [name] appeared alert and oriented to person, place, and time.  Jonathan [name] maintained eye contact and presented as cooperative and engaged throughout the course of the meeting.  Affect was appropriate for the situation.  Thought processes were logical and goal directed.  Expressive speech was fluent.  Conversational speech was intact.  There was no evidence of psychosis or delusions.  Jonathan [name] explicitly denied SI and HI.    Intervention:  Health and behavior intervention    Symptoms:  sadness  I provided cognitive reframing aimed at enhancing coping skills.    Treatment Plan/Recommendations: Weekly behavioral intervention

## 2022-08-30 ENCOUNTER — Ambulatory Visit: Admission: RE | Admit: 2022-08-30 | Payer: Medicare Other | Source: Ambulatory Visit

## 2022-09-06 ENCOUNTER — Ambulatory Visit
Admission: RE | Admit: 2022-09-06 | Discharge: 2022-09-06 | Disposition: A | Discharge status: Home / Self Care | Payer: Medicare Other | Source: Ambulatory Visit | Attending: Specialist | Admitting: Specialist

## 2022-09-06 DIAGNOSIS — S062X1S Diffuse traumatic brain injury with loss of consciousness of 30 minutes or less, sequela: Secondary | ICD-10-CM | POA: Insufficient documentation

## 2022-09-06 DIAGNOSIS — X58XXXS Exposure to other specified factors, sequela: Secondary | ICD-10-CM | POA: Insufficient documentation

## 2022-09-06 DIAGNOSIS — F4321 Adjustment disorder with depressed mood: Secondary | ICD-10-CM | POA: Insufficient documentation

## 2022-09-06 DIAGNOSIS — R41844 Frontal lobe and executive function deficit: Secondary | ICD-10-CM | POA: Insufficient documentation

## 2022-09-06 DIAGNOSIS — H5330 Unspecified disorder of binocular vision: Secondary | ICD-10-CM | POA: Insufficient documentation

## 2022-09-06 DIAGNOSIS — R278 Other lack of coordination: Secondary | ICD-10-CM | POA: Insufficient documentation

## 2022-09-06 NOTE — Progress Notes (Signed)
[facility]  Outpatient Neuro Rehabilitation Department  [address] [place] [address]  Phone: [phone]    Fax: [phone]                 NEUROPSYCHOLOGICAL PROGRESS NOTE      PATIENT: Jonathan [name] DOB: 02-16-1951   MRN: [id number]  AGE: 72 y.o.    Primary [place]   Interpreter:         Referring Provider: Loyal Buba, MD      Date of Service: 09/06/2022       General Observations:    [name] appeared alert and oriented to person, place, and time.  [name] maintained eye contact and presented as cooperative and engaged throughout the course of the meeting.  Affect was appropriate for the situation.  Thought processes were logical and goal directed.  Expressive speech was fluent.  Conversational speech was intact.  There was no evidence of psychosis or delusions.  [name] explicitly denied SI and HI.    Intervention:  Health and behavior intervention    Symptoms:  sadness  I provided cognitive reframing aimed at facilitating the grieving process    Treatment Plan/Recommendations: Weekly behavioral intervention

## 2022-09-13 ENCOUNTER — Ambulatory Visit
Admission: RE | Admit: 2022-09-13 | Discharge: 2022-09-13 | Disposition: A | Discharge status: Home / Self Care | Payer: Medicare Other | Source: Ambulatory Visit

## 2022-09-13 DIAGNOSIS — F4321 Adjustment disorder with depressed mood: Secondary | ICD-10-CM

## 2022-09-13 NOTE — Progress Notes (Signed)
[facility]  Outpatient Neuro Rehabilitation Department  [address] [place] [address]  Phone: [phone]    Fax: [phone]                 NEUROPSYCHOLOGICAL PROGRESS NOTE      PATIENT: Jonathan [name] DOB: 03-02-1951   MRN: [id number]  AGE: 72 y.o.    Primary [place]   Interpreter:         Referring Provider: Loyal Buba, MD      Date of Service: 09/13/2022       General Observations:    Jonathan [name] appeared alert and oriented to person, place, and time.  Jonathan [name] maintained eye contact and presented as cooperative and engaged throughout the course of the meeting.  Affect was appropriate for the situation.  Thought processes were logical and goal directed.  Expressive speech was fluent.  Conversational speech was intact.  There was no evidence of psychosis or delusions.  Jonathan [name] explicitly denied SI and HI.    Intervention:  Health and behavior intervention    Symptoms:  sadness  I provided cognitive reframing aimed at facilitating the grieving process    Treatment Plan/Recommendations: Weekly behavioral intervention

## 2022-09-20 ENCOUNTER — Ambulatory Visit: Payer: Medicare Other

## 2022-09-27 ENCOUNTER — Ambulatory Visit
Admission: RE | Admit: 2022-09-27 | Discharge: 2022-09-27 | Disposition: A | Discharge status: Home / Self Care | Payer: Medicare Other | Source: Ambulatory Visit

## 2022-09-27 DIAGNOSIS — F329 Major depressive disorder, single episode, unspecified: Secondary | ICD-10-CM

## 2022-09-27 NOTE — Progress Notes (Signed)
[facility]  Outpatient Neuro Rehabilitation Department  [address] [place] [address]  Phone: [phone]    Fax: [phone]                 NEUROPSYCHOLOGICAL PROGRESS NOTE      PATIENT: Jonathan [name] DOB: 01/12/1951   MRN: [id number]  AGE: 72 y.o.    Primary [place]   Interpreter:         Referring Provider: Loyal Buba, MD      Date of Service: 09/27/2022       General Observations:    [name] appeared alert and oriented to person, place, and time.  [name] maintained eye contact and presented as cooperative and engaged throughout the course of the meeting.  Affect was appropriate for the situation.  Thought processes were logical and goal directed.  Expressive speech was fluent.  Conversational speech was intact.  There was no evidence of psychosis or delusions.  [name] explicitly denied SI and HI.    Intervention:  Health and behavior intervention    Symptoms:  sadness  I provided cognitive reframing aimed at facilitating the grieving process    Treatment Plan/Recommendations: Weekly behavioral intervention

## 2022-10-11 ENCOUNTER — Ambulatory Visit: Payer: Medicare Other

## 2022-10-18 ENCOUNTER — Ambulatory Visit
Admission: RE | Admit: 2022-10-18 | Discharge: 2022-10-18 | Disposition: A | Discharge status: Home / Self Care | Payer: Medicare Other | Source: Ambulatory Visit | Attending: Specialist | Admitting: Specialist

## 2022-10-18 DIAGNOSIS — R41844 Frontal lobe and executive function deficit: Secondary | ICD-10-CM | POA: Insufficient documentation

## 2022-10-18 DIAGNOSIS — F4321 Adjustment disorder with depressed mood: Secondary | ICD-10-CM | POA: Insufficient documentation

## 2022-10-18 DIAGNOSIS — F329 Major depressive disorder, single episode, unspecified: Secondary | ICD-10-CM

## 2022-10-18 DIAGNOSIS — X58XXXS Exposure to other specified factors, sequela: Secondary | ICD-10-CM | POA: Insufficient documentation

## 2022-10-18 DIAGNOSIS — H5330 Unspecified disorder of binocular vision: Secondary | ICD-10-CM | POA: Insufficient documentation

## 2022-10-18 DIAGNOSIS — S062X1S Diffuse traumatic brain injury with loss of consciousness of 30 minutes or less, sequela: Secondary | ICD-10-CM | POA: Insufficient documentation

## 2022-10-18 DIAGNOSIS — R278 Other lack of coordination: Secondary | ICD-10-CM | POA: Insufficient documentation

## 2022-10-18 NOTE — Progress Notes (Signed)
[facility]  Outpatient Neuro Rehabilitation Department  [address], [place] [address]  Phone: [phone]    Fax: [phone]                 NEUROPSYCHOLOGICAL PROGRESS NOTE      PATIENT: Jonathan [name] DOB: 1951-05-30   MRN: [id number]  AGE: 72 y.o.    Primary [place]   Interpreter:         Referring Provider: Loyal Buba, MD      Date of Service: 10/18/2022       General Observations:    Jonathan [name] appeared alert and oriented to person, place, and time.  Jonathan [name] maintained eye contact and presented as cooperative and engaged throughout the course of the meeting.  Affect was appropriate for the situation.  Thought processes were logical and goal directed.  Expressive speech was fluent.  Conversational speech was intact.  There was no evidence of psychosis or delusions.  Jonathan [name] explicitly denied SI and HI.    Intervention:  Health and behavior intervention    Symptoms:  sadness  I provided cognitive reframing aimed at facilitating the grieving process    Treatment Plan/Recommendations: Weekly behavioral intervention

## 2022-10-25 ENCOUNTER — Ambulatory Visit
Admission: RE | Admit: 2022-10-25 | Discharge: 2022-10-25 | Disposition: A | Discharge status: Home / Self Care | Payer: Medicare Other | Source: Ambulatory Visit

## 2022-10-25 DIAGNOSIS — F329 Major depressive disorder, single episode, unspecified: Secondary | ICD-10-CM

## 2022-10-25 NOTE — Progress Notes (Signed)
[facility]  Outpatient Neuro Rehabilitation Department  [address] [place] [address]  Phone: [phone]    Fax: [phone]                 NEUROPSYCHOLOGICAL PROGRESS NOTE      PATIENT: Jonathan [name] DOB: 02/15/1951   MRN: [id number]  AGE: 72 y.o.    Primary [place]   Interpreter:         Referring Provider: Loyal Buba, MD      Date of Service: 10/25/2022       General Observations:    Jonathan [name] appeared alert and oriented to person, place, and time.  Jonathan [name] maintained eye contact and presented as cooperative and engaged throughout the course of the meeting.  Affect was appropriate for the situation.  Thought processes were logical and goal directed.  Expressive speech was fluent.  Conversational speech was intact.  There was no evidence of psychosis or delusions.  Jonathan [name] explicitly denied SI and HI.    Intervention:  Health and behavior intervention    Symptoms:  sadness  I provided cognitive reframing aimed at establishing a positive view of self    Treatment Plan/Recommendations: Weekly behavioral intervention

## 2022-11-08 ENCOUNTER — Ambulatory Visit: Payer: Medicare Other | Admitting: Clinical Neuropsychologist

## 2022-11-15 ENCOUNTER — Ambulatory Visit: Payer: Medicare Other | Admitting: Clinical Neuropsychologist

## 2022-11-22 ENCOUNTER — Ambulatory Visit
Admission: RE | Admit: 2022-11-22 | Discharge: 2022-11-22 | Disposition: A | Discharge status: Home / Self Care | Payer: Medicare Other | Source: Ambulatory Visit | Attending: Specialist | Admitting: Specialist

## 2022-11-22 DIAGNOSIS — H5330 Unspecified disorder of binocular vision: Secondary | ICD-10-CM | POA: Insufficient documentation

## 2022-11-22 DIAGNOSIS — F329 Major depressive disorder, single episode, unspecified: Secondary | ICD-10-CM

## 2022-11-22 DIAGNOSIS — S062X1S Diffuse traumatic brain injury with loss of consciousness of 30 minutes or less, sequela: Secondary | ICD-10-CM | POA: Insufficient documentation

## 2022-11-22 DIAGNOSIS — R41844 Frontal lobe and executive function deficit: Secondary | ICD-10-CM | POA: Insufficient documentation

## 2022-11-22 DIAGNOSIS — X58XXXS Exposure to other specified factors, sequela: Secondary | ICD-10-CM | POA: Insufficient documentation

## 2022-11-22 DIAGNOSIS — F4321 Adjustment disorder with depressed mood: Secondary | ICD-10-CM | POA: Insufficient documentation

## 2022-11-22 DIAGNOSIS — R278 Other lack of coordination: Secondary | ICD-10-CM | POA: Insufficient documentation

## 2022-11-22 NOTE — Progress Notes (Signed)
[facility]  Outpatient Neuro Rehabilitation Department  [address] [place] [address]  Phone: [phone]    Fax: [phone]                 NEUROPSYCHOLOGICAL PROGRESS NOTE      PATIENT: Jonathan [name] DOB: [birth year]   MRN: [id number]  AGE: 72 y.o.    Primary Language:English   Interpreter:         Referring Provider: Roland Rack, MD      Date of Service: 11/22/2022       General Observations:    [name] appeared alert and oriented to person, place, and time.  [name] maintained eye contact and presented as cooperative and engaged throughout the course of the meeting.  Affect was appropriate for the situation.  Thought processes were logical and goal directed.  Expressive speech was fluent.  Conversational speech was intact.  There was no evidence of psychosis or delusions.  [name] explicitly denied SI and HI.    Intervention:  Health and behavior intervention    Symptoms:  sadness  I provided cognitive reframing aimed at enhancing coping skills.    Treatment Plan/Recommendations: Weekly behavioral intervention

## 2022-11-29 ENCOUNTER — Ambulatory Visit: Payer: Medicare Other | Admitting: Clinical Neuropsychologist

## 2022-12-06 ENCOUNTER — Ambulatory Visit: Payer: Medicare Other | Admitting: Clinical Neuropsychologist

## 2022-12-13 ENCOUNTER — Ambulatory Visit: Payer: Medicare Other | Admitting: Clinical Neuropsychologist

## 2022-12-20 ENCOUNTER — Ambulatory Visit
Admission: RE | Admit: 2022-12-20 | Discharge: 2022-12-20 | Disposition: A | Discharge status: Home / Self Care | Payer: Medicare Other | Source: Ambulatory Visit | Attending: Specialist | Admitting: Specialist

## 2022-12-20 DIAGNOSIS — S062X1S Diffuse traumatic brain injury with loss of consciousness of 30 minutes or less, sequela: Secondary | ICD-10-CM | POA: Insufficient documentation

## 2022-12-20 DIAGNOSIS — F4321 Adjustment disorder with depressed mood: Secondary | ICD-10-CM | POA: Insufficient documentation

## 2022-12-20 DIAGNOSIS — H5330 Unspecified disorder of binocular vision: Secondary | ICD-10-CM | POA: Insufficient documentation

## 2022-12-20 DIAGNOSIS — F329 Major depressive disorder, single episode, unspecified: Secondary | ICD-10-CM | POA: Insufficient documentation

## 2022-12-20 DIAGNOSIS — R278 Other lack of coordination: Secondary | ICD-10-CM | POA: Insufficient documentation

## 2022-12-20 DIAGNOSIS — R41844 Frontal lobe and executive function deficit: Secondary | ICD-10-CM | POA: Insufficient documentation

## 2022-12-20 DIAGNOSIS — X58XXXS Exposure to other specified factors, sequela: Secondary | ICD-10-CM | POA: Insufficient documentation

## 2022-12-20 DIAGNOSIS — S069XAA Unspecified intracranial injury with loss of consciousness status unknown, initial encounter: Secondary | ICD-10-CM | POA: Insufficient documentation

## 2022-12-20 NOTE — Progress Notes (Signed)
[facility]  Outpatient Neuro Rehabilitation Department  [address] [place] [address]  Phone: [phone]    Fax: [phone]                 NEUROPSYCHOLOGICAL TREATMENT PROGRESS NOTE      PATIENT: Jonathan [name] DOB: 06-08-1951   MRN: [id number]  AGE: 72 y.o.    Primary Language:English   Interpreter:         Referring Provider: Roland Rack, MD   Therapy Diagnosis:   1. Major depressive disorder with single episode, remission status unspecified              Date of Service: 12/20/2022       General Observations:    Jonathan [name] appeared alert and oriented to person, place, and time.  Jonathan [name] maintained eye contact and presented as cooperative and engaged throughout the course of the meeting.  Affect was appropriate for the situation.  Thought processes were logical and goal directed.  Expressive speech was fluent.  Conversational speech was intact.  There was no evidence of psychosis or delusions.  Jonathan [name] explicitly denied SI and HI.    Intervention:  Health and behavior intervention    Symptoms:  sadness  I provided cognitive reframing aimed at enhancing coping skills.    Treatment Plan/Recommendations: Weekly behavioral intervention

## 2022-12-27 ENCOUNTER — Ambulatory Visit
Admission: RE | Admit: 2022-12-27 | Discharge: 2022-12-27 | Disposition: A | Discharge status: Home / Self Care | Payer: Medicare Other | Source: Ambulatory Visit | Admitting: Clinical Neuropsychologist

## 2022-12-27 DIAGNOSIS — F329 Major depressive disorder, single episode, unspecified: Secondary | ICD-10-CM

## 2022-12-27 NOTE — Progress Notes (Signed)
[facility]  Outpatient Neuro Rehabilitation Department  [address] [place] [address]  Phone: [phone]    Fax: [phone]                 NEUROPSYCHOLOGICAL TREATMENT PROGRESS NOTE      PATIENT: Jonathan [name] DOB: 1950-08-05   MRN: [id number]  AGE: 72 y.o.    Primary Language:English   Interpreter:         Referring Provider: Roland Rack, MD   Therapy Diagnosis:   1. Major depressive disorder with single episode, remission status unspecified              Date of Service: 12/27/2022       General Observations:    Jonathan [name] appeared alert and oriented to person, place, and time.  Jonathan [name] maintained eye contact and presented as cooperative and engaged throughout the course of the meeting.  Affect was appropriate for the situation.  Thought processes were logical and goal directed.  Expressive speech was fluent.  Conversational speech was intact.  There was no evidence of psychosis or delusions.  Jonathan [name] explicitly denied SI and HI.    Intervention:  Health and behavior intervention    Symptoms:  sadness  I provided cognitive reframing aimed at enhancing coping skills.    Treatment Plan/Recommendations: Weekly behavioral intervention

## 2023-01-05 ENCOUNTER — Ambulatory Visit
Admission: RE | Admit: 2023-01-05 | Discharge: 2023-01-05 | Disposition: A | Discharge status: Home / Self Care | Payer: Medicare Other | Source: Ambulatory Visit | Attending: Specialist | Admitting: Specialist

## 2023-01-05 DIAGNOSIS — F4321 Adjustment disorder with depressed mood: Secondary | ICD-10-CM | POA: Insufficient documentation

## 2023-01-05 DIAGNOSIS — H5330 Unspecified disorder of binocular vision: Secondary | ICD-10-CM | POA: Insufficient documentation

## 2023-01-05 DIAGNOSIS — F329 Major depressive disorder, single episode, unspecified: Secondary | ICD-10-CM | POA: Insufficient documentation

## 2023-01-05 DIAGNOSIS — R278 Other lack of coordination: Secondary | ICD-10-CM | POA: Insufficient documentation

## 2023-01-05 DIAGNOSIS — S062X1S Diffuse traumatic brain injury with loss of consciousness of 30 minutes or less, sequela: Secondary | ICD-10-CM | POA: Insufficient documentation

## 2023-01-05 DIAGNOSIS — X58XXXS Exposure to other specified factors, sequela: Secondary | ICD-10-CM | POA: Insufficient documentation

## 2023-01-05 DIAGNOSIS — S069XAA Unspecified intracranial injury with loss of consciousness status unknown, initial encounter: Secondary | ICD-10-CM | POA: Insufficient documentation

## 2023-01-05 DIAGNOSIS — R41844 Frontal lobe and executive function deficit: Secondary | ICD-10-CM | POA: Insufficient documentation

## 2023-01-05 NOTE — Progress Notes (Signed)
[facility]  Outpatient Neuro Rehabilitation Department  [address] [place] [address]  Phone: [phone]    Fax: [phone]                 NEUROPSYCHOLOGICAL TREATMENT PROGRESS NOTE      PATIENT: GERAL MALPASS DOB: 11/04/1950   MRN: [id number]  AGE: 72 y.o.    Primary Language:English   Interpreter:         Referring Provider:     Therapy Diagnosis:   1. Major depressive disorder with single episode, remission status unspecified              Date of Service: 01/05/2023       General Observations:    [name] appeared alert and oriented to person, place, and time.  [name] maintained eye contact and presented as cooperative and engaged throughout the course of the meeting.  Affect was appropriate for the situation.  Thought processes were logical and goal directed.  Expressive speech was fluent.  Conversational speech was intact.  There was no evidence of psychosis or delusions.  [name] explicitly denied SI and HI.    Intervention:  Health and behavior intervention    Symptoms:  sadness  I provided cognitive reframing aimed at facilitating the grieving process    Treatment Plan/Recommendations: Weekly behavioral intervention

## 2023-02-14 ENCOUNTER — Ambulatory Visit
Admission: RE | Admit: 2023-02-14 | Discharge: 2023-02-14 | Disposition: A | Discharge status: Home / Self Care | Payer: Medicare Other | Source: Ambulatory Visit | Attending: Specialist | Admitting: Specialist

## 2023-02-14 DIAGNOSIS — S062X1S Diffuse traumatic brain injury with loss of consciousness of 30 minutes or less, sequela: Secondary | ICD-10-CM | POA: Insufficient documentation

## 2023-02-14 DIAGNOSIS — X58XXXS Exposure to other specified factors, sequela: Secondary | ICD-10-CM | POA: Insufficient documentation

## 2023-02-14 DIAGNOSIS — R41844 Frontal lobe and executive function deficit: Secondary | ICD-10-CM | POA: Insufficient documentation

## 2023-02-14 DIAGNOSIS — H5330 Unspecified disorder of binocular vision: Secondary | ICD-10-CM | POA: Insufficient documentation

## 2023-02-14 DIAGNOSIS — F4321 Adjustment disorder with depressed mood: Secondary | ICD-10-CM | POA: Insufficient documentation

## 2023-02-14 DIAGNOSIS — R278 Other lack of coordination: Secondary | ICD-10-CM | POA: Insufficient documentation

## 2023-02-14 DIAGNOSIS — F329 Major depressive disorder, single episode, unspecified: Secondary | ICD-10-CM | POA: Insufficient documentation

## 2023-02-14 DIAGNOSIS — S069XAA Unspecified intracranial injury with loss of consciousness status unknown, initial encounter: Secondary | ICD-10-CM | POA: Insufficient documentation

## 2023-02-14 NOTE — Progress Notes (Signed)
[facility]  Outpatient Neuro Rehabilitation Department  [address], [place] [address]  Phone: [phone]    Fax: [phone]                 NEUROPSYCHOLOGICAL TREATMENT PROGRESS NOTE      PATIENT: Jonathan [name] DOB: 12/25/1950   MRN: [id number]  AGE: 72 y.o.    Primary Language:English   Interpreter:         Referring Provider: Referring, Not On File,*   Therapy Diagnosis:   1. Major depressive disorder with single episode, remission status unspecified              Date of Service: 02/14/2023       General Observations:    Jonathan [name] appeared alert and oriented to person, place, and time.  Jonathan [name] maintained eye contact and presented as cooperative and engaged throughout the course of the meeting.  Affect was appropriate for the situation.  Thought processes were logical and goal directed.  Expressive speech was fluent.  Conversational speech was intact.  There was no evidence of psychosis or delusions.  Jonathan [name] explicitly denied SI and HI.    Intervention:  Health and behavior intervention    Symptoms:  sadness  I provided cognitive reframing aimed at enhancing coping skills.    Treatment Plan/Recommendations: Weekly behavioral intervention

## 2023-02-28 ENCOUNTER — Ambulatory Visit: Payer: Medicare Other | Admitting: Clinical Neuropsychologist

## 2023-03-02 ENCOUNTER — Ambulatory Visit
Admission: RE | Admit: 2023-03-02 | Discharge: 2023-03-02 | Disposition: A | Discharge status: Home / Self Care | Payer: Medicare Other | Source: Ambulatory Visit | Attending: Specialist | Admitting: Specialist

## 2023-03-02 DIAGNOSIS — R41844 Frontal lobe and executive function deficit: Secondary | ICD-10-CM | POA: Insufficient documentation

## 2023-03-02 DIAGNOSIS — F4321 Adjustment disorder with depressed mood: Secondary | ICD-10-CM | POA: Insufficient documentation

## 2023-03-02 DIAGNOSIS — F329 Major depressive disorder, single episode, unspecified: Secondary | ICD-10-CM | POA: Insufficient documentation

## 2023-03-02 DIAGNOSIS — H5330 Unspecified disorder of binocular vision: Secondary | ICD-10-CM | POA: Insufficient documentation

## 2023-03-02 DIAGNOSIS — X58XXXS Exposure to other specified factors, sequela: Secondary | ICD-10-CM | POA: Insufficient documentation

## 2023-03-02 DIAGNOSIS — S069XAA Unspecified intracranial injury with loss of consciousness status unknown, initial encounter: Secondary | ICD-10-CM | POA: Insufficient documentation

## 2023-03-02 DIAGNOSIS — S062X1S Diffuse traumatic brain injury with loss of consciousness of 30 minutes or less, sequela: Secondary | ICD-10-CM | POA: Insufficient documentation

## 2023-03-02 DIAGNOSIS — R278 Other lack of coordination: Secondary | ICD-10-CM | POA: Insufficient documentation

## 2023-03-02 NOTE — Progress Notes (Signed)
[facility]  Outpatient Neuro Rehabilitation Department  [address] [place] [address]  Phone: [phone]    Fax: [phone]                 NEUROPSYCHOLOGICAL TREATMENT PROGRESS NOTE      PATIENT: Jonathan [name] DOB: 1951-04-15   MRN: [id number]  AGE: 72 y.o.    Primary Language:English   Interpreter:         Referring Provider:     Therapy Diagnosis:   1. Major depressive disorder with single episode, remission status unspecified              Date of Service: 03/02/2023       General Observations:    Jonathan [name] appeared alert and oriented to person, place, and time.  Jonathan [name] maintained eye contact and presented as cooperative and engaged throughout the course of the meeting.  Affect was appropriate for the situation.  Thought processes were logical and goal directed.  Expressive speech was fluent.  Conversational speech was intact.  There was no evidence of psychosis or delusions.  Jonathan [name] explicitly denied SI and HI.    Intervention:  Health and behavior intervention    Symptoms:  sadness  I provided cognitive reframing aimed at enhancing mentalization    Treatment Plan/Recommendations: Weekly behavioral intervention

## 2023-03-09 ENCOUNTER — Ambulatory Visit
Admission: RE | Admit: 2023-03-09 | Discharge: 2023-03-09 | Disposition: A | Discharge status: Home / Self Care | Payer: Medicare Other | Source: Ambulatory Visit | Admitting: Clinical Neuropsychologist

## 2023-03-09 DIAGNOSIS — F329 Major depressive disorder, single episode, unspecified: Secondary | ICD-10-CM

## 2023-03-09 NOTE — Progress Notes (Signed)
[facility]  Outpatient Neuro Rehabilitation Department  [address] [place] [address]  Phone: [phone]    Fax: [phone]                 NEUROPSYCHOLOGICAL TREATMENT PROGRESS NOTE      PATIENT: SNOW FLEEGER DOB: 03/30/1951   MRN: [id number]  AGE: 72 y.o.    Primary Language:English   Interpreter:         Referring Provider: Roland Rack, MD   Therapy Diagnosis:   1. Major depressive disorder with single episode, remission status unspecified              Date of Service: 03/09/2023       General Observations:    [name] appeared alert and oriented to person, place, and time.  [name] maintained eye contact and presented as cooperative and engaged throughout the course of the meeting.  Affect was appropriate for the situation.  Thought processes were logical and goal directed.  Expressive speech was fluent.  Conversational speech was intact.  There was no evidence of psychosis or delusions.  [name] explicitly denied SI and HI.    Intervention:  Health and behavior intervention    Symptoms:  sadness  I provided cognitive reframing aimed at enhancing coping skills.    Treatment Plan/Recommendations: Weekly behavioral intervention

## 2023-03-16 ENCOUNTER — Ambulatory Visit
Admission: RE | Admit: 2023-03-16 | Discharge: 2023-03-16 | Disposition: A | Discharge status: Home / Self Care | Payer: Medicare Other | Source: Ambulatory Visit | Admitting: Clinical Neuropsychologist

## 2023-03-16 DIAGNOSIS — F329 Major depressive disorder, single episode, unspecified: Secondary | ICD-10-CM

## 2023-03-16 NOTE — Progress Notes (Signed)
[facility]  Outpatient Neuro Rehabilitation Department  [address], [place] [address]  Phone: [phone]    Fax: [phone]                 NEUROPSYCHOLOGICAL TREATMENT PROGRESS NOTE      PATIENT: Jonathan [name] DOB: Oct 15, 1950   MRN: [id number]  AGE: 72 y.o.    Primary Language:English   Interpreter:         Referring Provider: Roland Rack, MD   Therapy Diagnosis:   1. Major depressive disorder with single episode, remission status unspecified              Date of Service: 03/16/2023       General Observations:    [name] appeared alert and oriented to person, place, and time.  [name] maintained eye contact and presented as cooperative and engaged throughout the course of the meeting.  Affect was appropriate for the situation.  Thought processes were logical and goal directed.  Expressive speech was fluent.  Conversational speech was intact.  There was no evidence of psychosis or delusions.  [name] explicitly denied SI and HI.    Intervention:  Health and behavior intervention    Symptoms:  sadness  I provided cognitive reframing aimed at facilitating the grieving process    Treatment Plan/Recommendations: Weekly behavioral intervention

## 2023-03-23 ENCOUNTER — Ambulatory Visit: Payer: Medicare Other | Admitting: Clinical Neuropsychologist

## 2023-03-30 ENCOUNTER — Ambulatory Visit
Admission: RE | Admit: 2023-03-30 | Discharge: 2023-03-30 | Disposition: A | Discharge status: Home / Self Care | Payer: Medicare Other | Source: Ambulatory Visit | Admitting: Clinical Neuropsychologist

## 2023-03-30 DIAGNOSIS — F329 Major depressive disorder, single episode, unspecified: Secondary | ICD-10-CM

## 2023-03-30 NOTE — Progress Notes (Signed)
[facility]  Outpatient Neuro Rehabilitation Department  [address], [place] [address]  Phone: [phone]    Fax: [phone]                 NEUROPSYCHOLOGICAL TREATMENT PROGRESS NOTE      PATIENT: Jonathan [name] DOB: 1951/06/20   MRN: [id number]  AGE: 72 y.o.    Primary Language:English   Interpreter:         Referring Provider: Roland Rack, MD   Therapy Diagnosis:   1. Major depressive disorder with single episode, remission status unspecified              Date of Service: 03/30/2023       General Observations:    Jonathan [name] appeared alert and oriented to person, place, and time.  Jonathan [name] maintained eye contact and presented as cooperative and engaged throughout the course of the meeting.  Affect was appropriate for the situation.  Thought processes were logical and goal directed.  Expressive speech was fluent.  Conversational speech was intact.  There was no evidence of psychosis or delusions.  Jonathan [name] explicitly denied SI and HI.    Intervention:  Health and behavior intervention    Symptoms:  sadness  I provided cognitive reframing aimed at facilitating the grieving process    Treatment Plan/Recommendations: Weekly behavioral intervention    Concha Pyo, PsyD

## 2023-04-06 ENCOUNTER — Ambulatory Visit
Admission: RE | Admit: 2023-04-06 | Discharge: 2023-04-06 | Disposition: A | Discharge status: Home / Self Care | Payer: Medicare Other | Source: Ambulatory Visit | Attending: Specialist | Admitting: Specialist

## 2023-04-06 DIAGNOSIS — F329 Major depressive disorder, single episode, unspecified: Secondary | ICD-10-CM | POA: Insufficient documentation

## 2023-04-06 DIAGNOSIS — F4321 Adjustment disorder with depressed mood: Secondary | ICD-10-CM | POA: Insufficient documentation

## 2023-04-06 DIAGNOSIS — X58XXXS Exposure to other specified factors, sequela: Secondary | ICD-10-CM | POA: Insufficient documentation

## 2023-04-06 DIAGNOSIS — S062X1S Diffuse traumatic brain injury with loss of consciousness of 30 minutes or less, sequela: Secondary | ICD-10-CM | POA: Insufficient documentation

## 2023-04-06 DIAGNOSIS — R278 Other lack of coordination: Secondary | ICD-10-CM | POA: Insufficient documentation

## 2023-04-06 DIAGNOSIS — H5330 Unspecified disorder of binocular vision: Secondary | ICD-10-CM | POA: Insufficient documentation

## 2023-04-06 DIAGNOSIS — R41844 Frontal lobe and executive function deficit: Secondary | ICD-10-CM | POA: Insufficient documentation

## 2023-04-06 DIAGNOSIS — S069XAA Unspecified intracranial injury with loss of consciousness status unknown, initial encounter: Secondary | ICD-10-CM | POA: Insufficient documentation

## 2023-04-06 NOTE — Progress Notes (Signed)
[facility]  Outpatient Neuro Rehabilitation Department  [address] [place] [address]  Phone: [phone]    Fax: [phone]                 NEUROPSYCHOLOGICAL TREATMENT PROGRESS NOTE      PATIENT: Jonathan [name] DOB: 11-17-1950   MRN: [id number]  AGE: 72 y.o.    Primary Language:English   Interpreter:         Referring Provider: Norris Cross, NP   Therapy Diagnosis:   1. Major depressive disorder with single episode, remission status unspecified              Date of Service: 04/06/2023       General Observations:    Jonathan [name] appeared alert and oriented to person, place, and time.  Jonathan [name] maintained eye contact and presented as cooperative and engaged throughout the course of the meeting.  Affect was appropriate for the situation.  Thought processes were logical and goal directed.  Expressive speech was fluent.  Conversational speech was intact.  There was no evidence of psychosis or delusions.  Jonathan [name] explicitly denied SI and HI.    Intervention:  Health and behavior intervention    Symptoms:  sadness  I provided cognitive reframing aimed at supporting coping skills.    Treatment Plan/Recommendations: Weekly behavioral intervention    Concha Pyo, PsyD

## 2023-04-13 ENCOUNTER — Ambulatory Visit
Admission: RE | Admit: 2023-04-13 | Discharge: 2023-04-13 | Disposition: A | Discharge status: Home / Self Care | Payer: Medicare Other | Source: Ambulatory Visit | Admitting: Clinical Neuropsychologist

## 2023-04-13 DIAGNOSIS — F329 Major depressive disorder, single episode, unspecified: Secondary | ICD-10-CM

## 2023-04-13 NOTE — Progress Notes (Signed)
[facility]  Outpatient Neuro Rehabilitation Department  [address] [place] [address]  Phone: [phone]    Fax: [phone]                 NEUROPSYCHOLOGICAL TREATMENT PROGRESS NOTE      PATIENT: Jonathan [name] DOB: [birth year]   MRN: [id number]  AGE: 72 y.o.    Primary Language:English   Interpreter:         Referring Provider: Roland Rack, MD   Therapy Diagnosis:   1. Major depressive disorder with single episode, remission status unspecified              Date of Service: 04/13/2023  Time Spent: 60 mins.       General Observations:    [name] appeared alert and oriented to person, place, and time.  [name] maintained eye contact and presented as cooperative and engaged throughout the course of the meeting.  Affect was appropriate for the situation.  Thought processes were logical and goal directed.  Expressive speech was fluent.  Conversational speech was intact.  There was no evidence of psychosis or delusions.  [name] explicitly denied SI and HI.    Intervention:  Health and behavior intervention    Symptoms:  sadness  I provided cognitive reframing aimed at supporting coping skills.    Treatment Plan/Recommendations: Weekly behavioral intervention    Concha Pyo, PsyD

## 2023-04-20 ENCOUNTER — Ambulatory Visit
Admission: RE | Admit: 2023-04-20 | Discharge: 2023-04-20 | Disposition: A | Discharge status: Home / Self Care | Payer: Medicare Other | Source: Ambulatory Visit | Admitting: Clinical Neuropsychologist

## 2023-04-20 DIAGNOSIS — F329 Major depressive disorder, single episode, unspecified: Secondary | ICD-10-CM

## 2023-04-20 NOTE — Progress Notes (Signed)
[facility]  Outpatient Neuro Rehabilitation Department  [address] [place] [address]  Phone: [phone]    Fax: [phone]                 NEUROPSYCHOLOGICAL TREATMENT PROGRESS NOTE      PATIENT: JITENDER HANDEL DOB: 1950/11/20   MRN: [id number]  AGE: 72 y.o.    Primary Language:English   Interpreter:         Referring Provider: Roland Rack, MD   Therapy Diagnosis:   1. Major depressive disorder with single episode, remission status unspecified              Date of Service: 04/20/2023  Time Spent: 60 mins.       General Observations:    [name] appeared alert and oriented to person, place, and time.  [name] maintained eye contact and presented as cooperative and engaged throughout the course of the meeting.  Affect was appropriate for the situation.  Thought processes were logical and goal directed.  Expressive speech was fluent.  Conversational speech was intact.  There was no evidence of psychosis or delusions.  [name] explicitly denied SI and HI.    Intervention:  Health and behavior intervention    Symptoms:  sadness  I provided cognitive reframing aimed at facilitating the grieving process    Treatment Plan/Recommendations: Weekly behavioral intervention    Concha Pyo, PsyD

## 2023-04-27 ENCOUNTER — Ambulatory Visit
Admission: RE | Admit: 2023-04-27 | Discharge: 2023-04-27 | Disposition: A | Discharge status: Home / Self Care | Payer: Medicare Other | Source: Ambulatory Visit | Admitting: Clinical Neuropsychologist

## 2023-04-27 DIAGNOSIS — F329 Major depressive disorder, single episode, unspecified: Secondary | ICD-10-CM

## 2023-04-27 NOTE — Progress Notes (Signed)
[facility]  Outpatient Neuro Rehabilitation Department  [address], [place] [address]  Phone: [phone]    Fax: [phone]                 NEUROPSYCHOLOGICAL TREATMENT PROGRESS NOTE      PATIENT: Jonathan [name] DOB: 10/05/1950   MRN: [id number]  AGE: 72 y.o.    Primary Language:English   Interpreter:         Referring Provider: Roland Rack, MD   Therapy Diagnosis:   1. Major depressive disorder with single episode, remission status unspecified              Date of Service: 04/27/2023  Time Spent: 60 mins.       General Observations:    Jonathan [name] appeared alert and oriented to person, place, and time.  Jonathan [name] maintained eye contact and presented as cooperative and engaged throughout the course of the meeting.  Affect was appropriate for the situation.  Thought processes were logical and goal directed.  Expressive speech was fluent.  Conversational speech was intact.  There was no evidence of psychosis or delusions.  Jonathan [name].    Intervention:  Health and behavior intervention    Symptoms:  Holding a positive view of future  I provided cognitive reframing aimed at reinforcing positive emotional gains    Treatment Plan/Recommendations: Rana Snare behavioral intervention    Concha Pyo, PsyD

## 2023-05-04 ENCOUNTER — Ambulatory Visit
Admission: RE | Admit: 2023-05-04 | Discharge: 2023-05-04 | Disposition: A | Discharge status: Home / Self Care | Payer: Medicare Other | Source: Ambulatory Visit | Attending: Specialist | Admitting: Specialist

## 2023-05-04 DIAGNOSIS — R278 Other lack of coordination: Secondary | ICD-10-CM | POA: Insufficient documentation

## 2023-05-04 DIAGNOSIS — X58XXXS Exposure to other specified factors, sequela: Secondary | ICD-10-CM | POA: Insufficient documentation

## 2023-05-04 DIAGNOSIS — S069XAA Unspecified intracranial injury with loss of consciousness status unknown, initial encounter: Secondary | ICD-10-CM | POA: Insufficient documentation

## 2023-05-04 DIAGNOSIS — S062X1S Diffuse traumatic brain injury with loss of consciousness of 30 minutes or less, sequela: Secondary | ICD-10-CM | POA: Insufficient documentation

## 2023-05-04 DIAGNOSIS — F4321 Adjustment disorder with depressed mood: Secondary | ICD-10-CM | POA: Insufficient documentation

## 2023-05-04 DIAGNOSIS — H5330 Unspecified disorder of binocular vision: Secondary | ICD-10-CM | POA: Insufficient documentation

## 2023-05-04 DIAGNOSIS — F329 Major depressive disorder, single episode, unspecified: Secondary | ICD-10-CM | POA: Insufficient documentation

## 2023-05-04 DIAGNOSIS — R41844 Frontal lobe and executive function deficit: Secondary | ICD-10-CM | POA: Insufficient documentation

## 2023-05-04 NOTE — Progress Notes (Signed)
[facility]  Outpatient Neuro Rehabilitation Department  [address], [place] [address]  Phone: [phone]    Fax: [phone]                 NEUROPSYCHOLOGICAL TREATMENT PROGRESS NOTE      PATIENT: Jonathan [name] DOB: 1951-04-07   MRN: [id number]  AGE: 72 y.o.    Primary Language:English   Interpreter:         Referring Provider: Roland Rack, MD   Therapy Diagnosis:   1. Major depressive disorder with single episode, remission status unspecified              Date of Service: 05/04/2023  Time Spent: 60 mins.       General Observations:    [name] appeared alert and oriented to person, place, and time.  [name] maintained eye contact and presented as cooperative and engaged throughout the course of the meeting.  Affect was appropriate for the situation.  Thought processes were logical and goal directed.  Expressive speech was fluent.  Conversational speech was intact.  There was no evidence of psychosis or delusions.  [name] explicitly denied SI and HI.    Intervention:  Health and behavior intervention    Symptoms:  sadness  I provided cognitive reframing aimed at enhancing coping skills.    Treatment Plan/Recommendations: Weekly behavioral intervention    Concha Pyo, PsyD

## 2023-05-11 ENCOUNTER — Ambulatory Visit
Admission: RE | Admit: 2023-05-11 | Discharge: 2023-05-11 | Disposition: A | Discharge status: Home / Self Care | Payer: Medicare Other | Source: Ambulatory Visit | Admitting: Clinical Neuropsychologist

## 2023-05-11 DIAGNOSIS — F329 Major depressive disorder, single episode, unspecified: Secondary | ICD-10-CM

## 2023-05-11 NOTE — Progress Notes (Signed)
[facility]  Outpatient Neuro Rehabilitation Department  [address], [place] [address]  Phone: [phone]    Fax: [phone]                 NEUROPSYCHOLOGICAL TREATMENT PROGRESS NOTE      PATIENT: Jonathan [name] DOB: 1951/04/25   MRN: [id number]  AGE: 72 y.o.    Primary Language:English   Interpreter:         Referring Provider: Roland Rack, MD   Therapy Diagnosis:   1. Major depressive disorder with single episode, remission status unspecified              Date of Service: 05/11/2023  Time Spent: 60 mins.       General Observations:    Jonathan [name] appeared alert and oriented to person, place, and time.  Jonathan [name] maintained eye contact and presented as cooperative and engaged throughout the course of the meeting.  Affect was appropriate for the situation.  Thought processes were logical and goal directed.  Expressive speech was fluent.  Conversational speech was intact.  There was no evidence of psychosis or delusions.  Jonathan [name] explicitly denied SI and HI.    Intervention:  Health and behavior intervention    Symptoms:  sadness  I provided active listening, cognitive reframing, and supportive statements aimed at enhancing coping skills.    Treatment Plan/Recommendations: Weekly  behavioral intervention    Concha Pyo, PsyD

## 2023-05-18 ENCOUNTER — Ambulatory Visit: Payer: Medicare Other | Admitting: Clinical Neuropsychologist

## 2023-05-25 ENCOUNTER — Ambulatory Visit
Admission: RE | Admit: 2023-05-25 | Discharge: 2023-05-25 | Disposition: A | Discharge status: Home / Self Care | Payer: Medicare Other | Source: Ambulatory Visit | Admitting: Clinical Neuropsychologist

## 2023-05-25 DIAGNOSIS — F329 Major depressive disorder, single episode, unspecified: Secondary | ICD-10-CM

## 2023-05-25 NOTE — Progress Notes (Signed)
[facility]  Outpatient Neuro Rehabilitation Department  [address] [place] [address]  Phone: [phone]    Fax: [phone]                 NEUROPSYCHOLOGICAL TREATMENT PROGRESS NOTE      PATIENT: Jonathan [name] DOB: [birth year]   MRN: [id number]  AGE: 72 y.o.    Primary Language:English   Interpreter:         Referring Provider: Roland Rack, MD   Therapy Diagnosis:   1. Major depressive disorder with single episode, remission status unspecified              Date of Service: 05/25/2023  Time Spent: 60 mins.       General Observations:    Jonathan [name] appeared alert and oriented to person, place, and time.  Jonathan [name] maintained eye contact and presented as cooperative and engaged throughout the course of the meeting.  Affect was appropriate for the situation.  Thought processes were logical and goal directed.  Expressive speech was fluent.  Conversational speech was intact.  There was no evidence of psychosis or delusions.  Jonathan [name] explicitly denied SI and HI.    Intervention:  Health and behavior intervention    Symptoms:  sadness  I provided cognitive reframing    Treatment Plan/Recommendations: Weekly behavioral intervention    Jesson Foskey Genene Churn, PsyD

## 2023-06-01 ENCOUNTER — Ambulatory Visit
Admission: RE | Admit: 2023-06-01 | Discharge: 2023-06-01 | Disposition: A | Discharge status: Home / Self Care | Payer: Medicare Other | Source: Ambulatory Visit | Admitting: Clinical Neuropsychologist

## 2023-06-01 DIAGNOSIS — F329 Major depressive disorder, single episode, unspecified: Secondary | ICD-10-CM

## 2023-06-01 NOTE — Progress Notes (Signed)
 [facility]  Outpatient Neuro Rehabilitation Department  [address] [place] [address]  Phone: [phone]    Fax: [phone]                 NEUROPSYCHOLOGICAL TREATMENT PROGRESS NOTE      PATIENT: Jonathan [name] DOB: [birth year]

## 2023-06-08 ENCOUNTER — Ambulatory Visit
Admission: RE | Admit: 2023-06-08 | Discharge: 2023-06-08 | Disposition: A | Discharge status: Home / Self Care | Payer: Medicare Other | Source: Ambulatory Visit | Attending: Specialist | Admitting: Specialist

## 2023-06-08 DIAGNOSIS — F4321 Adjustment disorder with depressed mood: Secondary | ICD-10-CM | POA: Insufficient documentation

## 2023-06-08 DIAGNOSIS — S069XAA Unspecified intracranial injury with loss of consciousness status unknown, initial encounter: Secondary | ICD-10-CM | POA: Insufficient documentation

## 2023-06-08 DIAGNOSIS — R41844 Frontal lobe and executive function deficit: Secondary | ICD-10-CM | POA: Insufficient documentation

## 2023-06-08 DIAGNOSIS — X58XXXS Exposure to other specified factors, sequela: Secondary | ICD-10-CM | POA: Insufficient documentation

## 2023-06-08 DIAGNOSIS — H5330 Unspecified disorder of binocular vision: Secondary | ICD-10-CM | POA: Insufficient documentation

## 2023-06-08 DIAGNOSIS — F329 Major depressive disorder, single episode, unspecified: Secondary | ICD-10-CM | POA: Insufficient documentation

## 2023-06-08 DIAGNOSIS — S062X1S Diffuse traumatic brain injury with loss of consciousness of 30 minutes or less, sequela: Secondary | ICD-10-CM | POA: Insufficient documentation

## 2023-06-08 DIAGNOSIS — R278 Other lack of coordination: Secondary | ICD-10-CM | POA: Insufficient documentation

## 2023-06-08 NOTE — Progress Notes (Signed)
 [facility]  Outpatient Neuro Rehabilitation Department  [address] [place] [address]  Phone: [phone]    Fax: [phone]                 NEUROPSYCHOLOGICAL TREATMENT PROGRESS NOTE      PATIENT: Jonathan [name] DOB: [birth year]

## 2023-06-15 ENCOUNTER — Ambulatory Visit: Payer: Medicare Other | Admitting: Clinical Neuropsychologist

## 2023-06-22 ENCOUNTER — Ambulatory Visit: Payer: Medicare Other | Admitting: Clinical Neuropsychologist

## 2023-06-29 ENCOUNTER — Ambulatory Visit: Payer: Medicare Other | Admitting: Clinical Neuropsychologist

## 2023-07-06 ENCOUNTER — Ambulatory Visit: Payer: Medicare Other | Admitting: Clinical Neuropsychologist

## 2023-07-13 ENCOUNTER — Ambulatory Visit
Admission: RE | Admit: 2023-07-13 | Discharge: 2023-07-13 | Disposition: A | Discharge status: Home / Self Care | Payer: Medicare Other | Source: Ambulatory Visit | Attending: Specialist | Admitting: Specialist

## 2023-07-13 DIAGNOSIS — X58XXXS Exposure to other specified factors, sequela: Secondary | ICD-10-CM | POA: Insufficient documentation

## 2023-07-13 DIAGNOSIS — F329 Major depressive disorder, single episode, unspecified: Secondary | ICD-10-CM | POA: Insufficient documentation

## 2023-07-13 DIAGNOSIS — S062X1S Diffuse traumatic brain injury with loss of consciousness of 30 minutes or less, sequela: Secondary | ICD-10-CM | POA: Insufficient documentation

## 2023-07-13 DIAGNOSIS — H5330 Unspecified disorder of binocular vision: Secondary | ICD-10-CM | POA: Insufficient documentation

## 2023-07-13 DIAGNOSIS — S069XAA Unspecified intracranial injury with loss of consciousness status unknown, initial encounter: Secondary | ICD-10-CM | POA: Insufficient documentation

## 2023-07-13 DIAGNOSIS — F4321 Adjustment disorder with depressed mood: Secondary | ICD-10-CM | POA: Insufficient documentation

## 2023-07-13 DIAGNOSIS — R41844 Frontal lobe and executive function deficit: Secondary | ICD-10-CM | POA: Insufficient documentation

## 2023-07-13 DIAGNOSIS — R278 Other lack of coordination: Secondary | ICD-10-CM | POA: Insufficient documentation

## 2023-07-13 NOTE — Progress Notes (Signed)
[facility]  Outpatient Neuro Rehabilitation Department  [address] [place] [address]  Phone: [phone]    Fax: [phone]                 NEUROPSYCHOLOGICAL TREATMENT PROGRESS NOTE      PATIENT: Jonathan [name] DOB: Mar 25, 1951   MRN: [id number]  AGE: 72 y.o.    Primary Language:English   Interpreter:         Referring Provider: Roland Rack, MD   Therapy Diagnosis:   1. Major depressive disorder with single episode, remission status unspecified              Date of Service: 07/13/2023  Time Spent: 60 mins.       General Observations:    [name] appeared alert and oriented to person, place, and time.  [name] maintained eye contact and presented as cooperative and engaged throughout the course of the meeting.  Affect was appropriate for the situation.  Thought processes were logical and goal directed.  Expressive speech was fluent.  Conversational speech was intact.  There was no evidence of psychosis or delusions.  [name] explicitly denied SI and HI.    Intervention:  Health and behavior intervention    Symptoms:  sadness  I provided cognitive reframing aimed at facilitating the grieving process    Treatment Plan/Recommendations: Weekly behavioral intervention    Concha Pyo, PsyD

## 2023-07-20 ENCOUNTER — Ambulatory Visit
Admission: RE | Admit: 2023-07-20 | Discharge: 2023-07-20 | Disposition: A | Discharge status: Home / Self Care | Payer: Medicare Other | Source: Ambulatory Visit | Admitting: Clinical Neuropsychologist

## 2023-07-20 DIAGNOSIS — F329 Major depressive disorder, single episode, unspecified: Secondary | ICD-10-CM

## 2023-07-20 NOTE — Progress Notes (Signed)
 [facility]  Outpatient Neuro Rehabilitation Department  [address] [place] [address]  Phone: [phone]    Fax: [phone]                 NEUROPSYCHOLOGICAL TREATMENT PROGRESS NOTE      PATIENT: Jonathan [name] DOB: [birth year]

## 2023-07-27 ENCOUNTER — Ambulatory Visit: Payer: Medicare Other | Admitting: Clinical Neuropsychologist

## 2023-08-03 ENCOUNTER — Ambulatory Visit: Payer: Medicare Other | Admitting: Clinical Neuropsychologist

## 2023-08-07 ENCOUNTER — Telehealth: Payer: Self-pay

## 2023-08-07 NOTE — Telephone Encounter (Signed)
 Copied from CRM [id number]. Topic: Clinical Support - Medical Question  >> Aug 07, 2023  2:30 PM Veva PARAS wrote:  Jonathan [name] called about Clinical Support - Medical Question.  Additional details:    Patient has been referred by Primary Care Provider for a Screening Colonoscopy. ([facility] SCREENING PROCEDURE POOL) Patient is interested in having Screening Colonoscopy with [facility].   Please call patient to get the process started. Patient was made aware that Procedure Team will get back to them as soon as they can but the turnaround time is roughly 5-6 months for the interview and scheduling.      Agent confirmed this patient is having no other symptoms at the time of this call. (For other issues like GERD, Abdominal Pain, Anemia etc. a consult is required)      Please send MyChart Pre-Op questionnaire if applicable to this chart.    No positive FIT at this time

## 2023-08-10 ENCOUNTER — Encounter (INDEPENDENT_AMBULATORY_CARE_PROVIDER_SITE_OTHER): Payer: Self-pay

## 2023-08-10 ENCOUNTER — Ambulatory Visit
Admission: RE | Admit: 2023-08-10 | Discharge: 2023-08-10 | Disposition: A | Discharge status: Home / Self Care | Payer: Medicare Other | Source: Ambulatory Visit | Attending: Specialist | Admitting: Specialist

## 2023-08-10 DIAGNOSIS — H5330 Unspecified disorder of binocular vision: Secondary | ICD-10-CM | POA: Insufficient documentation

## 2023-08-10 DIAGNOSIS — F329 Major depressive disorder, single episode, unspecified: Secondary | ICD-10-CM | POA: Insufficient documentation

## 2023-08-10 DIAGNOSIS — R41844 Frontal lobe and executive function deficit: Secondary | ICD-10-CM | POA: Insufficient documentation

## 2023-08-10 DIAGNOSIS — F4321 Adjustment disorder with depressed mood: Secondary | ICD-10-CM | POA: Insufficient documentation

## 2023-08-10 DIAGNOSIS — R413 Other amnesia: Secondary | ICD-10-CM | POA: Insufficient documentation

## 2023-08-10 DIAGNOSIS — S062X1S Diffuse traumatic brain injury with loss of consciousness of 30 minutes or less, sequela: Secondary | ICD-10-CM | POA: Insufficient documentation

## 2023-08-10 DIAGNOSIS — R278 Other lack of coordination: Secondary | ICD-10-CM | POA: Insufficient documentation

## 2023-08-10 DIAGNOSIS — S069XAA Unspecified intracranial injury with loss of consciousness status unknown, initial encounter: Secondary | ICD-10-CM | POA: Insufficient documentation

## 2023-08-10 DIAGNOSIS — X58XXXS Exposure to other specified factors, sequela: Secondary | ICD-10-CM | POA: Insufficient documentation

## 2023-08-10 NOTE — Progress Notes (Signed)
 [facility]  Outpatient Neuro Rehabilitation Department  [address] [place] [address]  Phone: [phone]    Fax: [phone]                 NEUROPSYCHOLOGICAL TREATMENT PROGRESS NOTE      PATIENT: Jonathan [name] DOB: 12/10/1950   MRN: [id number]  AGE: 73 y.o.    Primary Language:English   Interpreter:         Referring Provider: Diona Elspeth BIRCH, MD   Therapy Diagnosis:   1. Major depressive disorder with single episode, remission status unspecified              Date of Service: 08/10/2023  Time Spent: 60 mins.       General Observations:    Lynwood JAYSON Kerns appeared alert and oriented to person, place, and time.  Lynwood JAYSON Kerns maintained eye contact and presented as cooperative and engaged throughout the course of the meeting.  Affect was appropriate for the situation.  Thought processes were logical and goal directed.  Expressive speech was fluent.  Conversational speech was intact.  There was no evidence of psychosis or delusions.  Lynwood JAYSON Kerns explicitly denied SI and HI.    Intervention:  Health and behavior intervention    Symptoms:  sadness  I provided cognitive reframing aimed at enhancing coping skills.    Treatment Plan/Recommendations: Weekly behavioral intervention    Hulda Norleen Ahle, PsyD

## 2023-08-14 ENCOUNTER — Other Ambulatory Visit: Payer: Self-pay | Admitting: Specialist

## 2023-08-17 ENCOUNTER — Ambulatory Visit
Admission: RE | Admit: 2023-08-17 | Discharge: 2023-08-17 | Disposition: A | Discharge status: Home / Self Care | Payer: Medicare Other | Source: Ambulatory Visit | Admitting: Clinical Neuropsychologist

## 2023-08-17 DIAGNOSIS — F329 Major depressive disorder, single episode, unspecified: Secondary | ICD-10-CM

## 2023-08-17 NOTE — Progress Notes (Signed)
 [facility]  Outpatient Neuro Rehabilitation Department  [address], [place] [address]  Phone: [phone]    Fax: [phone]                 NEUROPSYCHOLOGICAL TREATMENT PROGRESS NOTE      PATIENT: Jonathan [name] DOB: [birth year]   MRN: [id number]  AGE: 73 y.o.    Primary Language:English   Interpreter:         Referring Provider: Eppie Delroy NOVAK, NP   Therapy Diagnosis:   1. Major depressive disorder with single episode, remission status unspecified              Date of Service: 08/17/2023  Time Spent: 60 mins.       General Observations:    Jonathan [name] appeared alert and oriented to person, place, and time.  Jonathan [name] maintained eye contact and presented as cooperative and engaged throughout the course of the meeting.  Affect was appropriate for the situation.  Thought processes were logical and goal directed.  Expressive speech was fluent.  Conversational speech was intact.  There was no evidence of psychosis or delusions.  Jonathan [name] explicitly denied SI and HI.    Intervention:  Health and behavior intervention    Symptoms:  sadness  I provided cognitive reframing aimed at establishing a positive view of self and others    Treatment Plan/Recommendations: Weekly behavioral intervention    Hulda Norleen Ahle, PsyD

## 2023-08-24 ENCOUNTER — Ambulatory Visit
Admission: RE | Admit: 2023-08-24 | Discharge: 2023-08-24 | Disposition: A | Discharge status: Home / Self Care | Payer: Medicare Other | Source: Ambulatory Visit | Admitting: Clinical Neuropsychologist

## 2023-08-24 DIAGNOSIS — F329 Major depressive disorder, single episode, unspecified: Secondary | ICD-10-CM

## 2023-08-24 NOTE — Progress Notes (Signed)
 [facility]  Outpatient Neuro Rehabilitation Department  [address], [place] [address]  Phone: [phone]    Fax: [phone]                 NEUROPSYCHOLOGICAL TREATMENT PROGRESS NOTE      PATIENT: Jonathan [name] DOB: July 09, 1951   MRN: [id number]  AGE: 73 y.o.    Primary Language:English   Interpreter:         Referring Provider: Eppie Delroy NOVAK, NP   Therapy Diagnosis:   1. Major depressive disorder with single episode, remission status unspecified              Date of Service: 08/24/2023  Time Spent: 60 mins.       General Observations:    Lynwood JAYSON Kerns appeared alert and oriented to person, place, and time.  Lynwood JAYSON Kerns maintained eye contact and presented as cooperative and engaged throughout the course of the meeting.  Affect was appropriate for the situation.  Thought processes were logical and goal directed.  Expressive speech was fluent.  Conversational speech was intact.  There was no evidence of psychosis or delusions.  Lynwood JAYSON Kerns explicitly denied SI and HI.    Intervention:  Health and behavior intervention    Symptoms:  sadness  I provided cognitive reframing aimed at enhancing coping skills.    Treatment Plan/Recommendations: Weekly behavioral intervention    Hulda Norleen Ahle, PsyD

## 2023-08-28 ENCOUNTER — Ambulatory Visit
Admission: RE | Admit: 2023-08-28 | Discharge: 2023-08-28 | Disposition: A | Discharge status: Home / Self Care | Payer: Medicare Other | Source: Ambulatory Visit | Admitting: Clinical Neuropsychologist

## 2023-08-28 DIAGNOSIS — R413 Other amnesia: Secondary | ICD-10-CM

## 2023-08-28 DIAGNOSIS — F329 Major depressive disorder, single episode, unspecified: Secondary | ICD-10-CM
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
## 2023-08-31 ENCOUNTER — Ambulatory Visit
Admission: RE | Admit: 2023-08-31 | Discharge: 2023-08-31 | Disposition: A | Discharge status: Home / Self Care | Payer: Medicare Other | Source: Ambulatory Visit | Admitting: Clinical Neuropsychologist

## 2023-08-31 DIAGNOSIS — F329 Major depressive disorder, single episode, unspecified: Secondary | ICD-10-CM

## 2023-08-31 NOTE — Progress Notes (Signed)
 [facility]  Outpatient Neuro Rehabilitation Department  [address] [place] [address]  Phone: [phone]    Fax: [phone]                 NEUROPSYCHOLOGICAL TREATMENT PROGRESS NOTE      PATIENT: Jonathan [name] DOB: 1951/07/13   MRN: [id number]  AGE: 73 y.o.    Primary Language:English   Interpreter:         Referring Provider: Diona Elspeth BIRCH, MD   Therapy Diagnosis:   1. Major depressive disorder with single episode, remission status unspecified              Date of Service: 08/31/2023  Time Spent: 60 mins.       General Observations:    Jonathan [name] appeared alert and oriented to person, place, and time.  Jonathan [name] maintained eye contact and presented as cooperative and engaged throughout the course of the meeting.  Affect was appropriate for the situation.  Thought processes were logical and goal directed.  Expressive speech was fluent.  Conversational speech was intact.  There was no evidence of psychosis or delusions.  Jonathan [name] explicitly denied SI and HI.    Intervention:  Health and behavior intervention    Symptoms:  sadness  I provided cognitive reframing aimed at enhancing coping skills.    Treatment Plan/Recommendations: Weekly behavioral intervention    Hulda Norleen Ahle, PsyD

## 2023-09-07 ENCOUNTER — Ambulatory Visit
Admission: RE | Admit: 2023-09-07 | Discharge: 2023-09-07 | Disposition: A | Discharge status: Home / Self Care | Payer: Medicare Other | Source: Ambulatory Visit | Attending: Specialist | Admitting: Specialist

## 2023-09-07 DIAGNOSIS — F4321 Adjustment disorder with depressed mood: Secondary | ICD-10-CM | POA: Insufficient documentation

## 2023-09-07 DIAGNOSIS — F329 Major depressive disorder, single episode, unspecified: Secondary | ICD-10-CM | POA: Insufficient documentation

## 2023-09-07 DIAGNOSIS — H5330 Unspecified disorder of binocular vision: Secondary | ICD-10-CM | POA: Insufficient documentation

## 2023-09-07 DIAGNOSIS — S069XAA Unspecified intracranial injury with loss of consciousness status unknown, initial encounter: Secondary | ICD-10-CM | POA: Insufficient documentation

## 2023-09-07 DIAGNOSIS — R41844 Frontal lobe and executive function deficit: Secondary | ICD-10-CM | POA: Insufficient documentation

## 2023-09-07 DIAGNOSIS — R413 Other amnesia: Secondary | ICD-10-CM | POA: Insufficient documentation

## 2023-09-07 DIAGNOSIS — X58XXXS Exposure to other specified factors, sequela: Secondary | ICD-10-CM | POA: Insufficient documentation

## 2023-09-07 DIAGNOSIS — R278 Other lack of coordination: Secondary | ICD-10-CM | POA: Insufficient documentation

## 2023-09-07 DIAGNOSIS — S062X1S Diffuse traumatic brain injury with loss of consciousness of 30 minutes or less, sequela: Secondary | ICD-10-CM | POA: Insufficient documentation

## 2023-09-07 NOTE — Progress Notes (Signed)
 [facility]  Outpatient Neuro Rehabilitation Department  [address], [place] [address]  Phone: [phone]    Fax: [phone]                 NEUROPSYCHOLOGICAL TREATMENT PROGRESS NOTE      PATIENT: Jonathan [name] DOB: 11-Apr-1951   MRN: [id number]  AGE: 73 y.o.    Primary Language:English   Interpreter:         Referring Provider:     Therapy Diagnosis:   1. Major depressive disorder with single episode, remission status unspecified              Date of Service: 09/07/2023  Time Spent: 60 mins.       General Observations:    Jonathan [name] appeared alert and oriented to person, place, and time.  Jonathan [name] maintained eye contact and presented as cooperative and engaged throughout the course of the meeting.  Affect was appropriate for the situation.  Thought processes were logical and goal directed.  Expressive speech was fluent.  Conversational speech was intact.  There was no evidence of psychosis or delusions.  Jonathan [name] explicitly denied SI and HI.    Intervention:  Health and behavior intervention    Symptoms:  sadness  I provided cognitive reframing aimed at enhancing coping skills.    Treatment Plan/Recommendations: Weekly behavioral intervention    Hulda Norleen Ahle, PsyD

## 2023-09-14 ENCOUNTER — Ambulatory Visit: Payer: Medicare Other | Admitting: Clinical Neuropsychologist

## 2023-09-21 ENCOUNTER — Ambulatory Visit: Payer: Medicare Other | Admitting: Clinical Neuropsychologist

## 2023-09-28 ENCOUNTER — Ambulatory Visit
Admission: RE | Admit: 2023-09-28 | Discharge: 2023-09-28 | Disposition: A | Discharge status: Home / Self Care | Payer: Medicare Other | Source: Ambulatory Visit | Admitting: Clinical Neuropsychologist

## 2023-09-28 DIAGNOSIS — F329 Major depressive disorder, single episode, unspecified: Secondary | ICD-10-CM

## 2023-09-28 NOTE — Progress Notes (Signed)
 [facility]  Outpatient Neuro Rehabilitation Department  [address] [place] [address]  Phone: [phone]    Fax: [phone]                 NEUROPSYCHOLOGICAL TREATMENT PROGRESS NOTE      PATIENT: Jonathan [name] DOB: 1950/08/20   MRN: [id number]  AGE: 73 y.o.    Primary Language:English   Interpreter:         Referring Provider:     Therapy Diagnosis:   1. Major depressive disorder with single episode, remission status unspecified              Date of Service: 09/28/2023  Time Spent: 60 mins.       General Observations:    Lynwood JAYSON Kerns appeared alert and oriented to person, place, and time.  Lynwood JAYSON Kerns maintained eye contact and presented as cooperative and engaged throughout the course of the meeting.  Affect was appropriate for the situation.  Thought processes were logical and goal directed.  Expressive speech was fluent.  Conversational speech was intact.  There was no evidence of psychosis or delusions.  Lynwood JAYSON Kerns explicitly denied SI and HI.    Intervention:  Health and behavior intervention    Symptoms:  sadness  I provided cognitive reframing aimed at enhancing coping skills.    Treatment Plan/Recommendations: Weekly behavioral intervention    Hulda Norleen Ahle, PsyD

## 2023-10-05 ENCOUNTER — Ambulatory Visit
Admission: RE | Admit: 2023-10-05 | Discharge: 2023-10-05 | Disposition: A | Discharge status: Home / Self Care | Payer: Medicare Other | Source: Ambulatory Visit | Attending: Specialist | Admitting: Specialist

## 2023-10-05 DIAGNOSIS — F4321 Adjustment disorder with depressed mood: Secondary | ICD-10-CM | POA: Insufficient documentation

## 2023-10-05 DIAGNOSIS — S062X1S Diffuse traumatic brain injury with loss of consciousness of 30 minutes or less, sequela: Secondary | ICD-10-CM | POA: Insufficient documentation

## 2023-10-05 DIAGNOSIS — R278 Other lack of coordination: Secondary | ICD-10-CM | POA: Insufficient documentation

## 2023-10-05 DIAGNOSIS — R41844 Frontal lobe and executive function deficit: Secondary | ICD-10-CM | POA: Insufficient documentation

## 2023-10-05 DIAGNOSIS — S069XAA Unspecified intracranial injury with loss of consciousness status unknown, initial encounter: Secondary | ICD-10-CM | POA: Insufficient documentation

## 2023-10-05 DIAGNOSIS — F329 Major depressive disorder, single episode, unspecified: Secondary | ICD-10-CM | POA: Insufficient documentation

## 2023-10-05 DIAGNOSIS — R413 Other amnesia: Secondary | ICD-10-CM | POA: Insufficient documentation

## 2023-10-05 DIAGNOSIS — X58XXXS Exposure to other specified factors, sequela: Secondary | ICD-10-CM | POA: Insufficient documentation

## 2023-10-05 DIAGNOSIS — H5330 Unspecified disorder of binocular vision: Secondary | ICD-10-CM | POA: Insufficient documentation

## 2023-10-06 NOTE — Progress Notes (Signed)
 [facility]  Outpatient Neuro Rehabilitation Department  [address], [place] [address]  Phone: [phone]    Fax: [phone]                 NEUROPSYCHOLOGICAL TREATMENT PROGRESS NOTE      PATIENT: Jonathan [name] DOB: 09-Nov-1950   MRN: [id number]  AGE: 73 y.o.    Primary Language:English   Interpreter:         Referring Provider:     Therapy Diagnosis:   1. Major depressive disorder with single episode, remission status unspecified              Date of Service: 10/06/2023  Time Spent: 60 mins.       General Observations:    Lynwood JAYSON Kerns appeared alert and oriented to person, place, and time.  Lynwood JAYSON Kerns maintained eye contact and presented as cooperative and engaged throughout the course of the meeting.  Affect was appropriate for the situation.  Thought processes were logical and goal directed.  Expressive speech was fluent.  Conversational speech was intact.  There was no evidence of psychosis or delusions.  Lynwood JAYSON Kerns explicitly denied SI and HI.    Intervention:  Health and behavior intervention    Symptoms:  Improving sadness  I provided cognitive reframing aimed at reinforcing positive emotional gains    Treatment Plan/Recommendations: Weekly behavioral intervention    Hulda Norleen Ahle, PsyD

## 2023-10-12 ENCOUNTER — Ambulatory Visit
Admission: RE | Admit: 2023-10-12 | Discharge: 2023-10-12 | Disposition: A | Discharge status: Home / Self Care | Payer: Medicare Other | Source: Ambulatory Visit | Admitting: Clinical Neuropsychologist

## 2023-10-12 DIAGNOSIS — F329 Major depressive disorder, single episode, unspecified: Secondary | ICD-10-CM

## 2023-10-12 NOTE — Progress Notes (Signed)
 [facility]  Outpatient Neuro Rehabilitation Department  [address], [place] [address]  Phone: [phone]    Fax: [phone]                 NEUROPSYCHOLOGICAL TREATMENT PROGRESS NOTE      PATIENT: Jonathan [name] DOB: 02-02-1951   MRN: [id number]  AGE: 73 y.o.    Primary Language:English   Interpreter:         Referring Provider:     Therapy Diagnosis:   1. Major depressive disorder with single episode, remission status unspecified              Date of Service: 10/12/2023  Time Spent: 60 mins.       General Observations:    Jonathan [name] appeared alert and oriented to person, place, and time.  Jonathan [name] maintained eye contact and presented as cooperative and engaged throughout the course of the meeting.  Affect was appropriate for the situation.  Thought processes were logical and goal directed.  Expressive speech was fluent.  Conversational speech was intact.  There was no evidence of psychosis or delusions.  Jonathan [name] explicitly denied SI and HI.    Intervention:  Health and behavior intervention    Symptoms:  sadness  I provided cognitive reframing aimed at enhancing coping skills.    Treatment Plan/Recommendations: Weekly behavioral intervention    Hulda Norleen Ahle, PsyD

## 2023-10-19 ENCOUNTER — Ambulatory Visit
Admission: RE | Admit: 2023-10-19 | Discharge: 2023-10-19 | Disposition: A | Discharge status: Home / Self Care | Payer: Medicare Other | Source: Ambulatory Visit | Admitting: Clinical Neuropsychologist

## 2023-10-19 DIAGNOSIS — F329 Major depressive disorder, single episode, unspecified: Secondary | ICD-10-CM

## 2023-10-19 NOTE — Progress Notes (Signed)
 [facility]  Outpatient Neuro Rehabilitation Department  [address] [place] [address]  Phone: [phone]    Fax: [phone]                 NEUROPSYCHOLOGICAL TREATMENT PROGRESS NOTE      PATIENT: Jonathan [name] DOB: May 17, 1951   MRN: [id number]  AGE: 73 y.o.    Primary Language:English   Interpreter:         Referring Provider:     Therapy Diagnosis:   1. Major depressive disorder with single episode, remission status unspecified              Date of Service: 10/19/2023  Time Spent: 60 mins.       General Observations:    [name] appeared alert and oriented to person, place, and time.  [name] maintained eye contact and presented as cooperative and engaged throughout the course of the meeting.  Affect was appropriate for the situation.  Thought processes were logical and goal directed.  Expressive speech was fluent.  Conversational speech was intact.  There was no evidence of psychosis or delusions.  [name] explicitly denied SI and HI.    Intervention:  Health and behavior intervention    Symptoms:  sadness  I provided cognitive reframing aimed at enhancing coping skills.    Treatment Plan/Recommendations: Weekly behavioral intervention    Janann August, PsyD

## 2023-10-23 LAB — UNMAPPED LAB RESULTS: Cologuard Result: NEGATIVE

## 2023-10-26 ENCOUNTER — Ambulatory Visit
Admission: RE | Admit: 2023-10-26 | Discharge: 2023-10-26 | Disposition: A | Discharge status: Home / Self Care | Payer: Medicare Other | Source: Ambulatory Visit | Admitting: Clinical Neuropsychologist

## 2023-10-26 DIAGNOSIS — F329 Major depressive disorder, single episode, unspecified: Secondary | ICD-10-CM

## 2023-10-27 NOTE — Progress Notes (Signed)
 [facility]  Outpatient Neuro Rehabilitation Department  [address], [place] [address]  Phone: [phone]    Fax: [phone]                 NEUROPSYCHOLOGICAL TREATMENT PROGRESS NOTE      PATIENT: Jonathan [name] DOB: October 31, 1950   MRN: [id number]  AGE: 73 y.o.    Primary Language:English   Interpreter:         Referring Provider:     Therapy Diagnosis:   1. Major depressive disorder with single episode, remission status unspecified              Date of Service: 10/27/2023  Time Spent: 60 mins.       General Observations:    Jonathan [name] appeared alert and oriented to person, place, and time.  Jonathan [name] maintained eye contact and presented as cooperative and engaged throughout the course of the meeting.  Affect was appropriate for the situation.  Thought processes were logical and goal directed.  Expressive speech was fluent.  Conversational speech was intact.  There was no evidence of psychosis or delusions.  Jonathan [name] explicitly denied SI and HI.    Intervention:  Health and behavior intervention    Symptoms:  sadness  I provided cognitive reframing aimed at enhancing coping skills.    Treatment Plan/Recommendations: Weekly behavioral intervention    Hulda JINNY Ahle, PsyD

## 2023-11-02 ENCOUNTER — Ambulatory Visit
Admission: RE | Admit: 2023-11-02 | Discharge: 2023-11-02 | Discharge status: Home / Self Care | Payer: Medicare Other | Source: Ambulatory Visit | Attending: Specialist | Admitting: Clinical Neuropsychologist

## 2023-11-02 DIAGNOSIS — R413 Other amnesia: Secondary | ICD-10-CM | POA: Insufficient documentation

## 2023-11-02 DIAGNOSIS — S062X1S Diffuse traumatic brain injury with loss of consciousness of 30 minutes or less, sequela: Secondary | ICD-10-CM | POA: Insufficient documentation

## 2023-11-02 DIAGNOSIS — F4321 Adjustment disorder with depressed mood: Secondary | ICD-10-CM | POA: Insufficient documentation

## 2023-11-02 DIAGNOSIS — S069XAA Unspecified intracranial injury with loss of consciousness status unknown, initial encounter: Secondary | ICD-10-CM | POA: Insufficient documentation

## 2023-11-02 DIAGNOSIS — R41844 Frontal lobe and executive function deficit: Secondary | ICD-10-CM | POA: Insufficient documentation

## 2023-11-02 DIAGNOSIS — F329 Major depressive disorder, single episode, unspecified: Secondary | ICD-10-CM | POA: Insufficient documentation

## 2023-11-02 DIAGNOSIS — H5330 Unspecified disorder of binocular vision: Secondary | ICD-10-CM | POA: Insufficient documentation

## 2023-11-02 DIAGNOSIS — R278 Other lack of coordination: Secondary | ICD-10-CM | POA: Insufficient documentation

## 2023-11-02 DIAGNOSIS — X58XXXS Exposure to other specified factors, sequela: Secondary | ICD-10-CM | POA: Insufficient documentation

## 2023-11-03 NOTE — Progress Notes (Signed)
 [facility]  Outpatient Neuro Rehabilitation Department  [address] [place] [address]  Phone: [phone]    Fax: [phone]                 NEUROPSYCHOLOGICAL TREATMENT PROGRESS NOTE      PATIENT: [name] DOB: 1950-08-28   MRN: [id number]  AGE: 73 y.o.    Primary Language:English   Interpreter:         Referring Provider: Diona Elspeth BIRCH, MD   Therapy Diagnosis:   1. Major depressive disorder with single episode, remission status unspecified              Date of Service: 11/03/2023  Time Spent: 60 mins.       General Observations:    Jonathan [name] appeared alert and oriented to person, place, and time.  Jonathan [name] maintained eye contact and presented as cooperative and engaged throughout the course of the meeting.  Affect was appropriate for the situation.  Thought processes were logical and goal directed.  Expressive speech was fluent.  Conversational speech was intact.  There was no evidence of psychosis or delusions.  Jonathan [name] explicitly denied SI and HI.    Intervention:  Health and behavior intervention    Symptoms:  sadness  I provided cognitive reframing aimed at enhancing coping skills.    Treatment Plan/Recommendations: Weekly behavioral intervention    Hulda JINNY Ahle, PsyD

## 2023-11-09 ENCOUNTER — Ambulatory Visit
Admission: RE | Admit: 2023-11-09 | Discharge: 2023-11-09 | Disposition: A | Discharge status: Home / Self Care | Payer: Medicare Other | Source: Ambulatory Visit | Admitting: Clinical Neuropsychologist

## 2023-11-09 DIAGNOSIS — F329 Major depressive disorder, single episode, unspecified: Secondary | ICD-10-CM

## 2023-11-10 NOTE — Progress Notes (Signed)
 [facility]  Outpatient Neuro Rehabilitation Department  [address] [place] [address]  Phone: [phone]    Fax: [phone]                 NEUROPSYCHOLOGICAL TREATMENT PROGRESS NOTE      PATIENT: Jonathan [name] DOB: 06-Feb-1951   MRN: [id number]  AGE: 73 y.o.    Primary Language:English   Interpreter:         Referring Provider: Lannie Pizza, NP   Therapy Diagnosis:   1. Major depressive disorder with single episode, remission status unspecified              Date of Service: 11/10/2023  Time Spent: 60 mins.       General Observations:    Jonathan [name] appeared alert and oriented to person, place, and time.  Jonathan [name] maintained eye contact and presented as cooperative and engaged throughout the course of the meeting.  Affect was appropriate for the situation.  Thought processes were logical and goal directed.  Expressive speech was fluent.  Conversational speech was intact.  There was no evidence of psychosis or delusions.  Jonathan [name] explicitly denied SI and HI.    Intervention:  Health and behavior intervention    Symptoms:  sadness  I provided cognitive reframing aimed at enhancing coping skills.    Treatment Plan/Recommendations: Weekly behavioral intervention    Ronni Colace, PsyD

## 2023-11-16 ENCOUNTER — Ambulatory Visit
Admission: RE | Admit: 2023-11-16 | Discharge: 2023-11-16 | Disposition: A | Discharge status: Home / Self Care | Payer: Medicare Other | Source: Ambulatory Visit | Admitting: Clinical Neuropsychologist

## 2023-11-16 DIAGNOSIS — F329 Major depressive disorder, single episode, unspecified: Secondary | ICD-10-CM

## 2023-11-16 NOTE — Progress Notes (Signed)
 [facility]  Outpatient Neuro Rehabilitation Department  [address] [place] [address]  Phone: [phone]    Fax: [phone]                 NEUROPSYCHOLOGICAL TREATMENT PROGRESS NOTE      PATIENT: Jonathan [name] DOB: 12-03-1950   MRN: [id number]  AGE: 73 y.o.    Primary Language:English   Interpreter:         Referring Provider: Lannie Pizza, NP   Therapy Diagnosis:   1. Major depressive disorder with single episode, remission status unspecified              Date of Service: 11/16/2023  Time Spent: 60 mins.       General Observations:    Jonathan [name] appeared alert and oriented to person, place, and time.  Jonathan [name] maintained eye contact and presented as cooperative and engaged throughout the course of the meeting.  Affect was appropriate for the situation.  Thought processes were logical and goal directed.  Expressive speech was fluent.  Conversational speech was intact.  There was no evidence of psychosis or delusions.  Jonathan [name] explicitly denied SI and HI.    Intervention:  Health and behavior intervention    Symptoms:  sadness  I provided cognitive reframing aimed at enhancing coping skills.    Treatment Plan/Recommendations: Weekly behavioral intervention    Ronni Colace, PsyD

## 2023-11-18 ENCOUNTER — Encounter (INDEPENDENT_AMBULATORY_CARE_PROVIDER_SITE_OTHER): Payer: Self-pay

## 2023-11-18 NOTE — Telephone Encounter (Signed)
 Colonoscopy questionnaire sent to the patient.

## 2023-11-23 ENCOUNTER — Ambulatory Visit
Admission: RE | Admit: 2023-11-23 | Discharge: 2023-11-23 | Disposition: A | Discharge status: Home / Self Care | Payer: Medicare Other | Source: Ambulatory Visit | Admitting: Clinical Neuropsychologist

## 2023-11-23 DIAGNOSIS — F329 Major depressive disorder, single episode, unspecified: Secondary | ICD-10-CM

## 2023-11-23 NOTE — Progress Notes (Signed)
 [facility]  Outpatient Neuro Rehabilitation Department  [address] [place] [address]  Phone: [phone]    Fax: [phone]                 NEUROPSYCHOLOGICAL TREATMENT PROGRESS NOTE      PATIENT: LYNCOLN LEDGERWOOD DOB: 05-04-1951   MRN: [id number]  AGE: 73 y.o.    Primary Language:English   Interpreter:         Referring Provider: Lannie Pizza, NP   Therapy Diagnosis:   1. Major depressive disorder with single episode, remission status unspecified              Date of Service: 11/23/2023  Time Spent: 60 mins.       General Observations:    Seabron Cypress appeared alert and oriented to person, place, and time.  Seabron Cypress maintained eye contact and presented as cooperative and engaged throughout the course of the meeting.  Affect was appropriate for the situation.  Thought processes were logical and goal directed.  Expressive speech was fluent.  Conversational speech was intact.  There was no evidence of psychosis or delusions.  Seabron Cypress explicitly denied SI and HI.    Intervention:  Health and behavior intervention    Symptoms:  sadness  I provided cognitive reframing aimed at enhancing coping skills.    Treatment Plan/Recommendations: Weekly behavioral intervention    Ronni Colace, PsyD

## 2023-11-30 ENCOUNTER — Ambulatory Visit
Admission: RE | Admit: 2023-11-30 | Discharge: 2023-11-30 | Disposition: A | Discharge status: Home / Self Care | Payer: Medicare Other | Source: Ambulatory Visit | Attending: Specialist | Admitting: Specialist

## 2023-11-30 DIAGNOSIS — H5330 Unspecified disorder of binocular vision: Secondary | ICD-10-CM | POA: Insufficient documentation

## 2023-11-30 DIAGNOSIS — F329 Major depressive disorder, single episode, unspecified: Secondary | ICD-10-CM | POA: Insufficient documentation

## 2023-11-30 DIAGNOSIS — R413 Other amnesia: Secondary | ICD-10-CM | POA: Insufficient documentation

## 2023-11-30 DIAGNOSIS — R41844 Frontal lobe and executive function deficit: Secondary | ICD-10-CM | POA: Insufficient documentation

## 2023-11-30 DIAGNOSIS — S062X1S Diffuse traumatic brain injury with loss of consciousness of 30 minutes or less, sequela: Secondary | ICD-10-CM | POA: Insufficient documentation

## 2023-11-30 DIAGNOSIS — S069XAA Unspecified intracranial injury with loss of consciousness status unknown, initial encounter: Secondary | ICD-10-CM | POA: Insufficient documentation

## 2023-11-30 DIAGNOSIS — R278 Other lack of coordination: Secondary | ICD-10-CM | POA: Insufficient documentation

## 2023-11-30 DIAGNOSIS — F4321 Adjustment disorder with depressed mood: Secondary | ICD-10-CM | POA: Insufficient documentation

## 2023-11-30 DIAGNOSIS — X58XXXS Exposure to other specified factors, sequela: Secondary | ICD-10-CM | POA: Insufficient documentation

## 2023-12-01 NOTE — Progress Notes (Signed)
 [facility]  Outpatient Neuro Rehabilitation Department  [address] [place] [address]  Phone: [phone]    Fax: [phone]                 NEUROPSYCHOLOGICAL TREATMENT PROGRESS NOTE      PATIENT: Jonathan [name] DOB: 1950/11/06   MRN: [id number]  AGE: 73 y.o.    Primary Language:English   Interpreter:         Referring Provider: Lannie Pizza, NP   Therapy Diagnosis:   1. Major depressive disorder with single episode, remission status unspecified              Date of Service: 12/01/2023  Time Spent: 60 mins.       General Observations:    Jonathan [name] appeared alert and oriented to person, place, and time.  Jonathan [name] maintained eye contact and presented as cooperative and engaged throughout the course of the meeting.  Affect was appropriate for the situation.  Thought processes were logical and goal directed.  Expressive speech was fluent.  Conversational speech was intact.  There was no evidence of psychosis or delusions.  Jonathan [name] explicitly denied SI and HI.    Intervention:  Health and behavior intervention    Symptoms:  sadness  I provided cognitive reframing aimed at reducing self-criticism and increasing self-regard    Treatment Plan/Recommendations: Weekly behavioral intervention    Ronni Colace, PsyD

## 2023-12-07 ENCOUNTER — Ambulatory Visit
Admission: RE | Admit: 2023-12-07 | Discharge: 2023-12-07 | Disposition: A | Discharge status: Home / Self Care | Payer: Medicare Other | Source: Ambulatory Visit | Admitting: Clinical Neuropsychologist

## 2023-12-07 DIAGNOSIS — F329 Major depressive disorder, single episode, unspecified: Secondary | ICD-10-CM

## 2023-12-07 NOTE — Progress Notes (Signed)
 [facility]  Outpatient Neuro Rehabilitation Department  [address] [place] [address]  Phone: [phone]    Fax: [phone]                 NEUROPSYCHOLOGICAL TREATMENT PROGRESS NOTE      PATIENT: Jonathan [name] DOB: Dec 14, 1950   MRN: [id number]  AGE: 73 y.o.    Primary Language:English   Interpreter:         Referring Provider: Eppie Delroy NOVAK, NP   Therapy Diagnosis:   1. Major depressive disorder with single episode, remission status unspecified              Date of Service: 12/07/2023  Time Spent: 60 mins.       General Observations:    Jonathan [name] appeared alert and oriented to person, place, and time.  Jonathan [name] maintained eye contact and presented as cooperative and engaged throughout the course of the meeting.  Affect was appropriate for the situation.  Thought processes were logical and goal directed.  Expressive speech was fluent.  Conversational speech was intact.  There was no evidence of psychosis or delusions.  Jonathan [name] explicitly denied SI and HI.    Intervention:  Health and behavior intervention    Symptoms:  sadness  I provided cognitive reframing aimed at enhancing coping skills.    Treatment Plan/Recommendations: Weekly behavioral intervention    Hulda JINNY Ahle, PsyD

## 2023-12-14 ENCOUNTER — Ambulatory Visit: Payer: Medicare Other | Admitting: Clinical Neuropsychologist

## 2023-12-21 ENCOUNTER — Ambulatory Visit
Admission: RE | Admit: 2023-12-21 | Discharge: 2023-12-21 | Disposition: A | Discharge status: Home / Self Care | Payer: Medicare Other | Source: Ambulatory Visit | Admitting: Clinical Neuropsychologist

## 2023-12-21 DIAGNOSIS — F329 Major depressive disorder, single episode, unspecified: Secondary | ICD-10-CM

## 2023-12-22 NOTE — Progress Notes (Signed)
 [facility]  Outpatient Neuro Rehabilitation Department  [address] [place] [address]  Phone: [phone]    Fax: [phone]                 NEUROPSYCHOLOGICAL TREATMENT PROGRESS NOTE      PATIENT: Jonathan [name] DOB: [birth year]   MRN: [id number]  AGE: 73 y.o.    Primary Language:English   Interpreter:         Referring Provider: Eppie Delroy NOVAK, NP   Therapy Diagnosis:   1. Major depressive disorder with single episode, remission status unspecified              Date of Service: 12/22/2023  Time Spent: 60 mins.       General Observations:    Lynwood JAYSON Kerns appeared alert and oriented to person, place, and time.  Lynwood JAYSON Kerns maintained eye contact and presented as cooperative and engaged throughout the course of the meeting.  Affect was appropriate for the situation.  Thought processes were logical and goal directed.  Expressive speech was fluent.  Conversational speech was intact.  There was no evidence of psychosis or delusions.  Lynwood JAYSON Kerns explicitly denied SI and HI.    Intervention:  Health and behavior intervention    Symptoms:  sadness  I provided cognitive reframing aimed at enhancing coping skills.    Treatment Plan/Recommendations: Weekly behavioral intervention    Hulda JINNY Ahle, PsyD

## 2023-12-28 ENCOUNTER — Ambulatory Visit
Admission: RE | Admit: 2023-12-28 | Discharge: 2023-12-28 | Disposition: A | Discharge status: Home / Self Care | Payer: Medicare Other | Source: Ambulatory Visit | Admitting: Clinical Neuropsychologist

## 2023-12-28 DIAGNOSIS — F329 Major depressive disorder, single episode, unspecified: Secondary | ICD-10-CM

## 2023-12-28 NOTE — Progress Notes (Signed)
 [facility]  Outpatient Neuro Rehabilitation Department  [address] [place] [address]  Phone: [phone]    Fax: [phone]                 NEUROPSYCHOLOGICAL TREATMENT PROGRESS NOTE      PATIENT: Jonathan [name] DOB: [birth year]   MRN: [id number]  AGE: 73 y.o.    Primary Language:English   Interpreter:         Referring Provider: Eppie Delroy NOVAK, NP   Therapy Diagnosis:   1. Major depressive disorder with single episode, remission status unspecified              Date of Service: 12/28/2023  Time Spent: 60 mins.       General Observations:    Lynwood JAYSON Kerns appeared alert and oriented to person, place, and time.  Lynwood JAYSON Kerns maintained eye contact and presented as cooperative and engaged throughout the course of the meeting.  Affect was appropriate for the situation.  Thought processes were logical and goal directed.  Expressive speech was fluent.  Conversational speech was intact.  There was no evidence of psychosis or delusions.  Lynwood JAYSON Kerns explicitly denied SI and HI.    Intervention:  Health and behavior intervention    Symptoms:  sadness  I provided cognitive reframing aimed at enhancing coping skills.    Treatment Plan/Recommendations: Weekly behavioral intervention    Hulda JINNY Ahle, PsyD

## 2024-01-04 ENCOUNTER — Ambulatory Visit
Admission: RE | Admit: 2024-01-04 | Discharge: 2024-01-04 | Disposition: A | Discharge status: Home / Self Care | Payer: Medicare Other | Source: Ambulatory Visit | Attending: Specialist | Admitting: Specialist

## 2024-01-04 DIAGNOSIS — R41844 Frontal lobe and executive function deficit: Secondary | ICD-10-CM | POA: Insufficient documentation

## 2024-01-04 DIAGNOSIS — R278 Other lack of coordination: Secondary | ICD-10-CM | POA: Insufficient documentation

## 2024-01-04 DIAGNOSIS — S069XAA Unspecified intracranial injury with loss of consciousness status unknown, initial encounter: Secondary | ICD-10-CM | POA: Insufficient documentation

## 2024-01-04 DIAGNOSIS — X58XXXS Exposure to other specified factors, sequela: Secondary | ICD-10-CM | POA: Insufficient documentation

## 2024-01-04 DIAGNOSIS — S062X1S Diffuse traumatic brain injury with loss of consciousness of 30 minutes or less, sequela: Secondary | ICD-10-CM | POA: Insufficient documentation

## 2024-01-04 DIAGNOSIS — R413 Other amnesia: Secondary | ICD-10-CM | POA: Insufficient documentation

## 2024-01-04 DIAGNOSIS — F4321 Adjustment disorder with depressed mood: Secondary | ICD-10-CM | POA: Insufficient documentation

## 2024-01-04 DIAGNOSIS — F329 Major depressive disorder, single episode, unspecified: Secondary | ICD-10-CM | POA: Insufficient documentation

## 2024-01-04 DIAGNOSIS — H5330 Unspecified disorder of binocular vision: Secondary | ICD-10-CM | POA: Insufficient documentation

## 2024-01-04 NOTE — Progress Notes (Signed)
 [facility]  Outpatient Neuro Rehabilitation Department  [address] [place] [address]  Phone: [phone]    Fax: [phone]                 NEUROPSYCHOLOGICAL TREATMENT PROGRESS NOTE      PATIENT: Jonathan [name] DOB: Sep 05, 1950   MRN: [id number]  AGE: 73 y.o.    Primary Language:English   Interpreter:         Referring Provider: Eppie Delroy NOVAK, NP   Therapy Diagnosis:   1. Major depressive disorder with single episode, remission status unspecified              Date of Service: 01/04/2024  Time Spent: 60 mins.       General Observations:    Jonathan [name] appeared alert and oriented to person, place, and time.  Jonathan [name] maintained eye contact and presented as cooperative and engaged throughout the course of the meeting.  Affect was appropriate for the situation.  Thought processes were logical and goal directed.  Expressive speech was fluent.  Conversational speech was intact.  There was no evidence of psychosis or delusions.  Jonathan [name] explicitly denied SI and HI.    Intervention:  Health and behavior intervention    Symptoms:  sadness  I provided cognitive reframing aimed at enhancing coping skills.    Treatment Plan/Recommendations: Weekly behavioral intervention    Hulda JINNY Ahle, PsyD

## 2024-01-11 ENCOUNTER — Ambulatory Visit
Admission: RE | Admit: 2024-01-11 | Discharge: 2024-01-11 | Disposition: A | Discharge status: Home / Self Care | Payer: Medicare Other | Source: Ambulatory Visit | Admitting: Clinical Neuropsychologist

## 2024-01-11 DIAGNOSIS — F329 Major depressive disorder, single episode, unspecified: Secondary | ICD-10-CM

## 2024-01-11 NOTE — Progress Notes (Signed)
 [facility]  Outpatient Neuro Rehabilitation Department  [address], [place] [address]  Phone: [phone]    Fax: [phone]                 NEUROPSYCHOLOGICAL TREATMENT PROGRESS NOTE      PATIENT: DIONTA LARKE DOB: 02-06-1951   MRN: [id number]  AGE: 73 y.o.    Primary Language:English   Interpreter:         Referring Provider: Eppie Delroy NOVAK, NP   Therapy Diagnosis:   1. Major depressive disorder with single episode, remission status unspecified              Date of Service: 01/11/2024  Time Spent: 60 mins.       General Observations:    Lynwood JAYSON Kerns appeared alert and oriented to person, place, and time.  Lynwood JAYSON Kerns maintained eye contact and presented as cooperative and engaged throughout the course of the meeting.  Affect was appropriate for the situation.  Thought processes were logical and goal directed.  Expressive speech was fluent.  Conversational speech was intact.  There was no evidence of psychosis or delusions.  Lynwood JAYSON Kerns explicitly denied SI and HI.    Intervention:  Health and behavior intervention    Symptoms:  sadness  I provided cognitive reframing aimed at enhancing coping skills.    Treatment Plan/Recommendations: Weekly  behavioral intervention    Hulda JINNY Ahle, PsyD

## 2024-01-18 ENCOUNTER — Ambulatory Visit: Payer: Medicare Other | Admitting: Clinical Neuropsychologist

## 2024-01-25 ENCOUNTER — Ambulatory Visit: Payer: Medicare Other | Admitting: Clinical Neuropsychologist

## 2024-02-01 ENCOUNTER — Ambulatory Visit: Payer: Medicare Other | Admitting: Clinical Neuropsychologist

## 2024-02-08 ENCOUNTER — Ambulatory Visit: Payer: Medicare Other | Admitting: Clinical Neuropsychologist

## 2024-02-15 ENCOUNTER — Ambulatory Visit
Admission: RE | Admit: 2024-02-15 | Discharge: 2024-02-15 | Disposition: A | Discharge status: Home / Self Care | Source: Ambulatory Visit | Attending: Specialist | Admitting: Specialist

## 2024-02-15 DIAGNOSIS — R41844 Frontal lobe and executive function deficit: Secondary | ICD-10-CM | POA: Insufficient documentation

## 2024-02-15 DIAGNOSIS — R413 Other amnesia: Secondary | ICD-10-CM | POA: Insufficient documentation

## 2024-02-15 DIAGNOSIS — X58XXXS Exposure to other specified factors, sequela: Secondary | ICD-10-CM | POA: Insufficient documentation

## 2024-02-15 DIAGNOSIS — S069XAA Unspecified intracranial injury with loss of consciousness status unknown, initial encounter: Secondary | ICD-10-CM | POA: Insufficient documentation

## 2024-02-15 DIAGNOSIS — S062X1S Diffuse traumatic brain injury with loss of consciousness of 30 minutes or less, sequela: Secondary | ICD-10-CM | POA: Insufficient documentation

## 2024-02-15 DIAGNOSIS — F4321 Adjustment disorder with depressed mood: Secondary | ICD-10-CM | POA: Insufficient documentation

## 2024-02-15 DIAGNOSIS — H5330 Unspecified disorder of binocular vision: Secondary | ICD-10-CM | POA: Insufficient documentation

## 2024-02-15 DIAGNOSIS — F329 Major depressive disorder, single episode, unspecified: Secondary | ICD-10-CM | POA: Insufficient documentation

## 2024-02-15 DIAGNOSIS — R278 Other lack of coordination: Secondary | ICD-10-CM | POA: Insufficient documentation

## 2024-02-15 NOTE — Progress Notes (Signed)
 [facility]  Outpatient Neuro Rehabilitation Department  [address], [place] [address]  Phone: [phone]    Fax: [phone]                 NEUROPSYCHOLOGICAL TREATMENT PROGRESS NOTE      PATIENT: Jonathan [name] DOB: Feb 05, 1951   MRN: [id number]  AGE: 73 y.o.    Primary Language:English   Interpreter:         Referring Provider: Eppie Delroy NOVAK, NP   Therapy Diagnosis:   1. Major depressive disorder with single episode, remission status unspecified              Date of Service: 02/15/2024  Time Spent: 60 mins.       General Observations:    Jonathan [name] appeared alert and oriented to person, place, and time.  Jonathan [name] maintained eye contact and presented as cooperative and engaged throughout the course of the meeting.  Affect was appropriate for the situation.  Thought processes were logical and goal directed.  Expressive speech was fluent.  Conversational speech was intact.  There was no evidence of psychosis or delusions.  Jonathan [name] explicitly denied SI and HI.    Intervention:  Health and behavior intervention    Symptoms:  sadness  I provided cognitive reframing aimed at enhancing coping skills.    Treatment Plan/Recommendations: Weekly behavioral intervention    Jonathan JINNY Ahle, PsyD

## 2024-02-27 ENCOUNTER — Ambulatory Visit
Admission: RE | Admit: 2024-02-27 | Discharge: 2024-02-27 | Disposition: A | Discharge status: Home / Self Care | Source: Ambulatory Visit | Admitting: Clinical Neuropsychologist

## 2024-02-27 DIAGNOSIS — F329 Major depressive disorder, single episode, unspecified: Secondary | ICD-10-CM

## 2024-02-27 NOTE — Progress Notes (Signed)
 [facility]  Outpatient Neuro Rehabilitation Department  [address] [place] [address]  Phone: [phone]    Fax: [phone]                 NEUROPSYCHOLOGICAL TREATMENT PROGRESS NOTE      PATIENT: Jonathan [name] DOB: [birth year]   MRN: [id number]  AGE: 73 y.o.    Primary Language:English   Interpreter:         Referring Provider: Eppie Delroy NOVAK, NP   Therapy Diagnosis:   1. Major depressive disorder with single episode, remission status unspecified              Date of Service: 02/27/2024  Time Spent: 60 mins.       General Observations:    Jonathan [name] appeared alert and oriented to person, place, and time.  Jonathan [name] maintained eye contact and presented as cooperative and engaged throughout the course of the meeting.  Affect was appropriate for the situation.  Thought processes were logical and goal directed.  Expressive speech was fluent.  Conversational speech was intact.  There was no evidence of psychosis or delusions.  Jonathan [name] explicitly denied SI and HI.    Intervention:  Health and behavior intervention    Symptoms:  sadness  I provided cognitive reframing aimed at enhancing coping skills.    Treatment Plan/Recommendations: Weekly behavioral intervention    Hulda JINNY Ahle, PsyD

## 2024-03-18 ENCOUNTER — Ambulatory Visit
Admission: RE | Admit: 2024-03-18 | Discharge: 2024-03-18 | Disposition: A | Discharge status: Home / Self Care | Source: Ambulatory Visit | Attending: Specialist | Admitting: Specialist

## 2024-03-18 DIAGNOSIS — R278 Other lack of coordination: Secondary | ICD-10-CM | POA: Insufficient documentation

## 2024-03-18 DIAGNOSIS — R41844 Frontal lobe and executive function deficit: Secondary | ICD-10-CM | POA: Insufficient documentation

## 2024-03-18 DIAGNOSIS — H5330 Unspecified disorder of binocular vision: Secondary | ICD-10-CM | POA: Insufficient documentation

## 2024-03-18 DIAGNOSIS — S069XAA Unspecified intracranial injury with loss of consciousness status unknown, initial encounter: Secondary | ICD-10-CM | POA: Insufficient documentation

## 2024-03-18 DIAGNOSIS — R413 Other amnesia: Secondary | ICD-10-CM | POA: Insufficient documentation

## 2024-03-18 DIAGNOSIS — F329 Major depressive disorder, single episode, unspecified: Secondary | ICD-10-CM | POA: Insufficient documentation

## 2024-03-18 DIAGNOSIS — X58XXXS Exposure to other specified factors, sequela: Secondary | ICD-10-CM | POA: Insufficient documentation

## 2024-03-18 DIAGNOSIS — F4321 Adjustment disorder with depressed mood: Secondary | ICD-10-CM | POA: Insufficient documentation

## 2024-03-18 DIAGNOSIS — S062X1S Diffuse traumatic brain injury with loss of consciousness of 30 minutes or less, sequela: Secondary | ICD-10-CM | POA: Insufficient documentation

## 2024-03-18 NOTE — Progress Notes (Signed)
 [facility]  Outpatient Neuro Rehabilitation Department  [address], [place] [address]  Phone: [phone]    Fax: [phone]                 NEUROPSYCHOLOGICAL TREATMENT PROGRESS NOTE      PATIENT: Jonathan [name] DOB: 06-Aug-1950   MRN: [id number]  AGE: 74 y.o.    Primary Language:English   Interpreter:         Referring Provider: Eppie Delroy NOVAK, NP   Therapy Diagnosis:   1. Major depressive disorder with single episode, remission status unspecified              Date of Service: 03/18/2024  Time Spent: 60 mins.       General Observations:    Jonathan [name] appeared alert and oriented to person, place, and time.  Jonathan [name] maintained eye contact and presented as cooperative and engaged throughout the course of the meeting.  Affect was appropriate for the situation.  Thought processes were logical and goal directed.  Expressive speech was fluent.  Conversational speech was intact.  There was no evidence of psychosis or delusions.  Jonathan [name] explicitly denied SI and HI.    Intervention:  Health and behavior intervention    Symptoms:  sadness  I provided cognitive reframing aimed at facilitating the grieving process    Treatment Plan/Recommendations: Weekly behavioral intervention    Hulda JINNY Ahle, PsyD

## 2024-03-26 ENCOUNTER — Ambulatory Visit: Admitting: Clinical Neuropsychologist

## 2024-03-28 ENCOUNTER — Ambulatory Visit
Admission: RE | Admit: 2024-03-28 | Discharge: 2024-03-28 | Discharge status: Home / Self Care | Source: Ambulatory Visit | Admitting: Clinical Neuropsychologist

## 2024-03-28 ENCOUNTER — Ambulatory Visit: Admitting: Clinical Neuropsychologist

## 2024-03-28 DIAGNOSIS — F329 Major depressive disorder, single episode, unspecified: Secondary | ICD-10-CM

## 2024-03-28 NOTE — Progress Notes (Signed)
 [facility]  Outpatient Neuro Rehabilitation Department  [address], [place] [address]  Phone: [phone]    Fax: [phone]                 NEUROPSYCHOLOGICAL TREATMENT PROGRESS NOTE      PATIENT: DAMAN STEFFENHAGEN DOB: Jun 22, 1951   MRN: [id number]  AGE: 73 y.o.    Primary Language:English   Interpreter:         Referring Provider: Eppie Delroy NOVAK, NP   Therapy Diagnosis:   1. Major depressive disorder with single episode, remission status unspecified              Date of Service: 03/28/2024  Time Spent: 60 mins.       General Observations:    Lynwood JAYSON Kerns appeared alert and oriented to person, place, and time.  Lynwood JAYSON Kerns maintained eye contact and presented as cooperative and engaged throughout the course of the meeting.  Affect was appropriate for the situation.  Thought processes were logical and goal directed.  Expressive speech was fluent.  Conversational speech was intact.  There was no evidence of psychosis or delusions.  Lynwood JAYSON Kerns explicitly denied SI and HI.    Intervention:  Health and behavior intervention    Symptoms:  sadness  I provided cognitive reframing aimed at facilitating the grieving process    Treatment Plan/Recommendations: Weekly behavioral intervention    Hulda JINNY Ahle, PsyD

## 2024-04-02 ENCOUNTER — Ambulatory Visit
Admission: RE | Admit: 2024-04-02 | Discharge: 2024-04-02 | Disposition: A | Discharge status: Home / Self Care | Source: Ambulatory Visit | Attending: Specialist | Admitting: Specialist

## 2024-04-02 DIAGNOSIS — H5330 Unspecified disorder of binocular vision: Secondary | ICD-10-CM | POA: Insufficient documentation

## 2024-04-02 DIAGNOSIS — F4321 Adjustment disorder with depressed mood: Secondary | ICD-10-CM | POA: Insufficient documentation

## 2024-04-02 DIAGNOSIS — F329 Major depressive disorder, single episode, unspecified: Secondary | ICD-10-CM | POA: Insufficient documentation

## 2024-04-02 DIAGNOSIS — X58XXXS Exposure to other specified factors, sequela: Secondary | ICD-10-CM | POA: Insufficient documentation

## 2024-04-02 DIAGNOSIS — R413 Other amnesia: Secondary | ICD-10-CM | POA: Insufficient documentation

## 2024-04-02 DIAGNOSIS — S062X1S Diffuse traumatic brain injury with loss of consciousness of 30 minutes or less, sequela: Secondary | ICD-10-CM | POA: Insufficient documentation

## 2024-04-02 DIAGNOSIS — S069XAA Unspecified intracranial injury with loss of consciousness status unknown, initial encounter: Secondary | ICD-10-CM | POA: Insufficient documentation

## 2024-04-02 DIAGNOSIS — R41844 Frontal lobe and executive function deficit: Secondary | ICD-10-CM | POA: Insufficient documentation

## 2024-04-02 DIAGNOSIS — R278 Other lack of coordination: Secondary | ICD-10-CM | POA: Insufficient documentation

## 2024-04-02 NOTE — Progress Notes (Signed)
 [facility]  Outpatient Neuro Rehabilitation Department  [address] [place] [address]  Phone: [phone]    Fax: [phone]                 NEUROPSYCHOLOGICAL TREATMENT PROGRESS NOTE      PATIENT: Jonathan [name] DOB: [birth year]   MRN: [id number]  AGE: 73 y.o.    Primary Language:English   Interpreter:         Referring Provider: Eppie Delroy NOVAK, NP   Therapy Diagnosis:   1. Major depressive disorder with single episode, remission status unspecified              Date of Service: 04/02/2024  Time Spent: 60 mins.       General Observations:    Lynwood JAYSON Kerns appeared alert and oriented to person, place, and time.  Lynwood JAYSON Kerns maintained eye contact and presented as cooperative and engaged throughout the course of the meeting.  Affect was appropriate for the situation.  Thought processes were logical and goal directed.  Expressive speech was fluent.  Conversational speech was intact.  There was no evidence of psychosis or delusions.  Lynwood JAYSON Kerns explicitly denied SI and HI.    Intervention:  Health and behavior intervention    Symptoms:  sadness  I provided cognitive reframing aimed at facilitating the grieving process    Treatment Plan/Recommendations: Weekly behavioral intervention    Hulda JINNY Ahle, PsyD

## 2024-04-04 ENCOUNTER — Ambulatory Visit: Admitting: Clinical Neuropsychologist

## 2024-04-09 ENCOUNTER — Ambulatory Visit
Admission: RE | Admit: 2024-04-09 | Discharge: 2024-04-09 | Disposition: A | Discharge status: Home / Self Care | Source: Ambulatory Visit | Admitting: Clinical Neuropsychologist

## 2024-04-09 DIAGNOSIS — F329 Major depressive disorder, single episode, unspecified: Secondary | ICD-10-CM

## 2024-04-09 NOTE — Progress Notes (Signed)
 [facility]  Outpatient Neuro Rehabilitation Department  [address], [place] [address]  Phone: [phone]    Fax: [phone]                 NEUROPSYCHOLOGICAL TREATMENT PROGRESS NOTE      PATIENT: Jonathan [name] DOB: 29-Jun-1951   MRN: [id number]  AGE: 73 y.o.    Primary Language:English   Interpreter:         Referring Provider: Eppie Delroy NOVAK, NP   Therapy Diagnosis:   1. Major depressive disorder with single episode, remission status unspecified              Date of Service: 04/09/2024  Time Spent: 60 mins.       General Observations:    Jonathan [name] alert and oriented to person, place, and time.  Jonathan [name] maintained eye contact and presented as cooperative and engaged throughout the course of the meeting.  Affect was appropriate for the situation.  Thought processes were logical and goal directed.  Expressive speech was fluent.  Conversational speech was intact.  There was no evidence of psychosis or delusions.  Jonathan [name] explicitly denied SI and HI.    Intervention:  Health and behavior intervention    Symptoms:  sadness  I provided cognitive reframing aimed at enhancing coping skills.    Treatment Plan/Recommendations: Weekly behavioral intervention    Hulda JINNY Ahle, PsyD

## 2024-04-11 ENCOUNTER — Ambulatory Visit: Admitting: Clinical Neuropsychologist

## 2024-04-16 ENCOUNTER — Ambulatory Visit: Admitting: Clinical Neuropsychologist

## 2024-04-18 ENCOUNTER — Ambulatory Visit: Admitting: Clinical Neuropsychologist

## 2024-04-23 ENCOUNTER — Ambulatory Visit
Admission: RE | Admit: 2024-04-23 | Discharge: 2024-04-23 | Disposition: A | Discharge status: Home / Self Care | Source: Ambulatory Visit | Admitting: Clinical Neuropsychologist

## 2024-04-23 DIAGNOSIS — F329 Major depressive disorder, single episode, unspecified: Secondary | ICD-10-CM

## 2024-04-23 NOTE — Progress Notes (Signed)
 [facility]  Outpatient Neuro Rehabilitation Department  [address] [place] [address]  Phone: [phone]    Fax: [phone]                 NEUROPSYCHOLOGICAL TREATMENT PROGRESS NOTE      PATIENT: Jonathan [name] DOB: 03/29/1951   MRN: [id number]  AGE: 73 y.o.    Primary Language:English   Interpreter:         Referring Provider: Eppie Delroy NOVAK, NP   Therapy Diagnosis:   1. Major depressive disorder with single episode, remission status unspecified              Date of Service: 04/23/2024  Time Spent: 60 mins.       General Observations:    Jonathan [name] appeared alert and oriented to person, place, and time.  Jonathan [name] maintained eye contact and presented as cooperative and engaged throughout the course of the meeting.  Affect was appropriate for the situation.  Thought processes were logical and goal directed.  Expressive speech was fluent.  Conversational speech was intact.  There was no evidence of psychosis or delusions.  Jonathan [name] explicitly denied SI and HI.    Intervention:  Health and behavior intervention    Symptoms:  sadness  I provided cognitive reframing aimed at establishing a positive view of self    Treatment Plan/Recommendations: Weekly behavioral intervention    Jonathan JINNY Ahle, PsyD

## 2024-04-30 ENCOUNTER — Ambulatory Visit
Admission: RE | Admit: 2024-04-30 | Discharge: 2024-04-30 | Disposition: A | Discharge status: Home / Self Care | Source: Ambulatory Visit | Admitting: Clinical Neuropsychologist

## 2024-04-30 DIAGNOSIS — F329 Major depressive disorder, single episode, unspecified: Secondary | ICD-10-CM

## 2024-04-30 NOTE — Progress Notes (Signed)
 [facility] [place] [facility]  Outpatient Neuro Rehabilitation Department  [address] [place] [address]  Phone: [phone]    Fax: [phone]                 NEUROPSYCHOLOGICAL TREATMENT PROGRESS NOTE      PATIENT: Jonathan [name] DOB: 1950/08/30   MRN: [id number]  AGE: 73 y.o.    Primary Language:English   Interpreter:         Referring Provider: Eppie Delroy NOVAK, NP   Therapy Diagnosis:   1. Major depressive disorder with single episode, remission status unspecified              Date of Service: 04/30/2024  Time Spent: 60 mins.       General Observations:    Jonathan [name] appeared alert and oriented to person, place, and time.  Jonathan [name] maintained eye contact and presented as cooperative and engaged throughout the course of the meeting.  Affect was appropriate for the situation.  Thought processes were logical and goal directed.  Expressive speech was fluent.  Conversational speech was intact.  There was no evidence of psychosis or delusions.  Jonathan [name] explicitly denied SI and HI.    Intervention:  Health and behavior intervention    Symptoms:  sadness  I provided cognitive reframing aimed at enhancing coping skills.    Treatment Plan/Recommendations: Weekly behavioral intervention    Hulda JINNY Ahle, PsyD

## 2024-05-07 ENCOUNTER — Ambulatory Visit
Admission: RE | Admit: 2024-05-07 | Discharge: 2024-05-07 | Disposition: A | Discharge status: Home / Self Care | Source: Ambulatory Visit | Attending: Specialist | Admitting: Specialist

## 2024-05-07 DIAGNOSIS — R413 Other amnesia: Secondary | ICD-10-CM | POA: Insufficient documentation

## 2024-05-07 DIAGNOSIS — S069XAA Unspecified intracranial injury with loss of consciousness status unknown, initial encounter: Secondary | ICD-10-CM | POA: Insufficient documentation

## 2024-05-07 DIAGNOSIS — H5330 Unspecified disorder of binocular vision: Secondary | ICD-10-CM | POA: Insufficient documentation

## 2024-05-07 DIAGNOSIS — R278 Other lack of coordination: Secondary | ICD-10-CM | POA: Insufficient documentation

## 2024-05-07 DIAGNOSIS — X58XXXS Exposure to other specified factors, sequela: Secondary | ICD-10-CM | POA: Insufficient documentation

## 2024-05-07 DIAGNOSIS — F329 Major depressive disorder, single episode, unspecified: Secondary | ICD-10-CM | POA: Insufficient documentation

## 2024-05-07 DIAGNOSIS — F4321 Adjustment disorder with depressed mood: Secondary | ICD-10-CM | POA: Insufficient documentation

## 2024-05-07 DIAGNOSIS — R41844 Frontal lobe and executive function deficit: Secondary | ICD-10-CM | POA: Insufficient documentation

## 2024-05-07 DIAGNOSIS — S062X1S Diffuse traumatic brain injury with loss of consciousness of 30 minutes or less, sequela: Secondary | ICD-10-CM | POA: Insufficient documentation

## 2024-05-07 NOTE — Progress Notes (Signed)
 [facility]  Outpatient Neuro Rehabilitation Department  [address], [place] [address]  Phone: [phone]    Fax: [phone]                 NEUROPSYCHOLOGICAL TREATMENT PROGRESS NOTE      PATIENT: Jonathan [name] DOB: 1951-07-20   MRN: [id number]  AGE: 73 y.o.    Primary Language:English   Interpreter:         Referring Provider: Eppie Delroy NOVAK, NP   Therapy Diagnosis:   1. Major depressive disorder with single episode, remission status unspecified              Date of Service: 05/07/2024  Time Spent: 60 mins.       General Observations:    Jonathan [name] appeared alert and oriented to person, place, and time.  Jonathan [name] maintained eye contact and presented as cooperative and engaged throughout the course of the meeting.  Affect was appropriate for the situation.  Thought processes were logical and goal directed.  Expressive speech was fluent.  Conversational speech was intact.  There was no evidence of psychosis or delusions.  Jonathan [name] explicitly denied SI and HI.    Intervention:  Health and behavior intervention    Symptoms:  sadness  I provided cognitive reframing aimed at enhancing coping skills.    Treatment Plan/Recommendations: Weekly behavioral intervention    Hulda JINNY Ahle, PsyD

## 2024-05-14 ENCOUNTER — Ambulatory Visit
Admission: RE | Admit: 2024-05-14 | Discharge: 2024-05-14 | Disposition: A | Discharge status: Home / Self Care | Source: Ambulatory Visit | Admitting: Clinical Neuropsychologist

## 2024-05-14 DIAGNOSIS — F329 Major depressive disorder, single episode, unspecified: Secondary | ICD-10-CM

## 2024-05-14 NOTE — Progress Notes (Signed)
 [facility]  Outpatient Neuro Rehabilitation Department  [address] [place] [address]  Phone: [phone]    Fax: [phone]                 NEUROPSYCHOLOGICAL TREATMENT PROGRESS NOTE      PATIENT: Jonathan [name] DOB: 31-Jan-1951   MRN: [id number]  AGE: 73 y.o.    Primary Language:English   Interpreter:         Referring Provider: Eppie Delroy NOVAK, NP   Therapy Diagnosis:   1. Major depressive disorder with single episode, remission status unspecified              Date of Service: 05/14/2024  Time Spent: 60 mins.       General Observations:    Jonathan [name] appeared alert and oriented to person, place, and time.  Jonathan [name] maintained eye contact and presented as cooperative and engaged throughout the course of the meeting.  Affect was appropriate for the situation.  Thought processes were logical and goal directed.  Expressive speech was fluent.  Conversational speech was intact.  There was no evidence of psychosis or delusions.  Jonathan [name] explicitly denied SI and HI.    Intervention:  Health and behavior intervention    Symptoms:  sadness  I provided cognitive reframing aimed at facilitating the grieving process    Treatment Plan/Recommendations: Weekly behavioral intervention    Hulda JINNY Ahle, PsyD

## 2024-05-21 ENCOUNTER — Ambulatory Visit: Admitting: Clinical Neuropsychologist

## 2024-05-28 ENCOUNTER — Ambulatory Visit
Admission: RE | Admit: 2024-05-28 | Discharge: 2024-05-28 | Disposition: A | Discharge status: Home / Self Care | Source: Ambulatory Visit | Admitting: Clinical Neuropsychologist

## 2024-05-28 DIAGNOSIS — F329 Major depressive disorder, single episode, unspecified: Secondary | ICD-10-CM

## 2024-05-28 NOTE — Progress Notes (Signed)
 [facility]  Outpatient Neuro Rehabilitation Department  [address], [place] [address]  Phone: [phone]    Fax: [phone]                 NEUROPSYCHOLOGICAL TREATMENT PROGRESS NOTE      PATIENT: Jonathan [name] DOB: 1950/11/23   MRN: [id number]  AGE: 73 y.o.    Primary Language:English   Interpreter:         Referring Provider: Eppie Delroy NOVAK, NP   Therapy Diagnosis:   1. Major depressive disorder with single episode, remission status unspecified              Date of Service: 05/28/2024  Time Spent: 60 mins.       General Observations:    Lynwood JAYSON Kerns appeared alert and oriented to person, place, and time.  Lynwood JAYSON Kerns maintained eye contact and presented as cooperative and engaged throughout the course of the meeting.  Affect was appropriate for the situation.  Thought processes were logical and goal directed.  Expressive speech was fluent.  Conversational speech was intact.  There was no evidence of psychosis or delusions.  Lynwood JAYSON Kerns explicitly denied SI and HI.    Intervention:  Health and behavior intervention    Symptoms:  sadness  I provided cognitive reframing aimed at facilitating the grieving process    Treatment Plan/Recommendations: Weekly behavioral intervention    Hulda JINNY Ahle, PsyD

## 2024-06-04 ENCOUNTER — Ambulatory Visit: Admission: RE | Admit: 2024-06-04 | Source: Ambulatory Visit | Admitting: Clinical Neuropsychologist

## 2024-06-04 DIAGNOSIS — F4321 Adjustment disorder with depressed mood: Secondary | ICD-10-CM | POA: Insufficient documentation

## 2024-06-04 DIAGNOSIS — R413 Other amnesia: Secondary | ICD-10-CM | POA: Insufficient documentation

## 2024-06-04 DIAGNOSIS — R278 Other lack of coordination: Secondary | ICD-10-CM | POA: Insufficient documentation

## 2024-06-04 DIAGNOSIS — H5330 Unspecified disorder of binocular vision: Secondary | ICD-10-CM | POA: Insufficient documentation

## 2024-06-04 DIAGNOSIS — X58XXXS Exposure to other specified factors, sequela: Secondary | ICD-10-CM | POA: Insufficient documentation

## 2024-06-04 DIAGNOSIS — R41844 Frontal lobe and executive function deficit: Secondary | ICD-10-CM | POA: Insufficient documentation

## 2024-06-04 DIAGNOSIS — S069XAA Unspecified intracranial injury with loss of consciousness status unknown, initial encounter: Secondary | ICD-10-CM | POA: Insufficient documentation

## 2024-06-04 DIAGNOSIS — F329 Major depressive disorder, single episode, unspecified: Secondary | ICD-10-CM | POA: Insufficient documentation

## 2024-06-04 DIAGNOSIS — S062X1S Diffuse traumatic brain injury with loss of consciousness of 30 minutes or less, sequela: Secondary | ICD-10-CM | POA: Insufficient documentation

## 2024-06-11 ENCOUNTER — Ambulatory Visit: Admitting: Clinical Neuropsychologist

## 2024-06-18 ENCOUNTER — Ambulatory Visit
Admission: RE | Admit: 2024-06-18 | Discharge: 2024-06-18 | Disposition: A | Discharge status: Home / Self Care | Source: Ambulatory Visit | Attending: Specialist | Admitting: Specialist

## 2024-06-18 DIAGNOSIS — F329 Major depressive disorder, single episode, unspecified: Secondary | ICD-10-CM

## 2024-06-18 NOTE — Progress Notes (Signed)
 [facility]  Outpatient Neuro Rehabilitation Department  [address], [place] [address]  Phone: [phone]    Fax: [phone]                 NEUROPSYCHOLOGICAL TREATMENT PROGRESS NOTE      PATIENT: Jonathan [name] DOB: [birth year]   MRN: [id number]  AGE: 73 y.o.    Primary Language:English   Interpreter:         Referring Provider: Eppie Delroy NOVAK, NP   Therapy Diagnosis:   1. Major depressive disorder with single episode, remission status unspecified              Date of Service: 06/18/2024  Time Spent: 60 mins.       General Observations:    Lynwood JAYSON Kerns appeared alert and oriented to person, place, and time.  Lynwood JAYSON Kerns maintained eye contact and presented as cooperative and engaged throughout the course of the meeting.  Affect was appropriate for the situation.  Thought processes were logical and goal directed.  Expressive speech was fluent.  Conversational speech was intact.  There was no evidence of psychosis or delusions.  Lynwood JAYSON Kerns explicitly denied SI and HI.    Intervention:  Health and behavior intervention    Symptoms:  sadness  I provided cognitive reframing aimed at facilitating the grieving process    Treatment Plan/Recommendations: Weekly behavioral intervention    Hulda JINNY Ahle, PsyD

## 2024-06-25 ENCOUNTER — Ambulatory Visit
Admission: RE | Admit: 2024-06-25 | Discharge: 2024-06-25 | Disposition: A | Discharge status: Home / Self Care | Source: Ambulatory Visit | Admitting: Clinical Neuropsychologist

## 2024-06-25 DIAGNOSIS — F329 Major depressive disorder, single episode, unspecified: Secondary | ICD-10-CM

## 2024-06-25 NOTE — Progress Notes (Signed)
 [facility]  Outpatient Neuro Rehabilitation Department  [address] [place] [address]  Phone: [phone]    Fax: [phone]                 NEUROPSYCHOLOGICAL TREATMENT PROGRESS NOTE      PATIENT: Jonathan [name] DOB: [birth year]   MRN: [id number]  AGE: 73 y.o.    Primary Language:English   Interpreter:         Referring Provider: Eppie Delroy NOVAK, NP   Therapy Diagnosis:   1. Major depressive disorder with single episode, remission status unspecified              Date of Service: 06/25/2024  Time Spent: 60 mins.       General Observations:    Lynwood JAYSON Kerns appeared alert and oriented to person, place, and time.  Lynwood JAYSON Kerns maintained eye contact and presented as cooperative and engaged throughout the course of the meeting.  Affect was appropriate for the situation.  Thought processes were logical and goal directed.  Expressive speech was fluent.  Conversational speech was intact.  There was no evidence of psychosis or delusions.  Lynwood JAYSON Kerns explicitly denied SI and HI.    Intervention:  Health and behavior intervention    Symptoms:  sadness  I provided cognitive reframing aimed at enhancing coping skills.    Treatment Plan/Recommendations: Weekly behavioral intervention    Hulda JINNY Ahle, PsyD

## 2024-07-02 ENCOUNTER — Ambulatory Visit
Admission: RE | Admit: 2024-07-02 | Discharge: 2024-07-02 | Disposition: A | Discharge status: Home / Self Care | Source: Ambulatory Visit | Attending: Specialist | Admitting: Specialist

## 2024-07-02 DIAGNOSIS — X58XXXS Exposure to other specified factors, sequela: Secondary | ICD-10-CM | POA: Insufficient documentation

## 2024-07-02 DIAGNOSIS — R278 Other lack of coordination: Secondary | ICD-10-CM | POA: Insufficient documentation

## 2024-07-02 DIAGNOSIS — R413 Other amnesia: Secondary | ICD-10-CM | POA: Insufficient documentation

## 2024-07-02 DIAGNOSIS — F4321 Adjustment disorder with depressed mood: Secondary | ICD-10-CM | POA: Insufficient documentation

## 2024-07-02 DIAGNOSIS — R41844 Frontal lobe and executive function deficit: Secondary | ICD-10-CM | POA: Insufficient documentation

## 2024-07-02 DIAGNOSIS — S069XAA Unspecified intracranial injury with loss of consciousness status unknown, initial encounter: Secondary | ICD-10-CM | POA: Insufficient documentation

## 2024-07-02 DIAGNOSIS — S062X1S Diffuse traumatic brain injury with loss of consciousness of 30 minutes or less, sequela: Secondary | ICD-10-CM | POA: Insufficient documentation

## 2024-07-02 DIAGNOSIS — F329 Major depressive disorder, single episode, unspecified: Secondary | ICD-10-CM | POA: Insufficient documentation

## 2024-07-02 DIAGNOSIS — H5330 Unspecified disorder of binocular vision: Secondary | ICD-10-CM | POA: Insufficient documentation

## 2024-07-02 NOTE — Progress Notes (Signed)
 [facility]  Outpatient Neuro Rehabilitation Department  [address] [place] [address]  Phone: [phone]    Fax: [phone]                 NEUROPSYCHOLOGICAL TREATMENT PROGRESS NOTE      PATIENT: Jonathan [name] DOB: 09-Dec-1950   MRN: [id number]  AGE: 73 y.o.    Primary Language:English   Interpreter:         Referring Provider: Eppie Delroy NOVAK, NP   Therapy Diagnosis:   1. Major depressive disorder with single episode, remission status unspecified              Date of Service: 07/02/2024  Time Spent: 60 mins.       General Observations:    Jonathan [name] appeared alert and oriented to person, place, and time.  Jonathan [name] maintained eye contact and presented as cooperative and engaged throughout the course of the meeting.  Affect was appropriate for the situation.  Thought processes were logical and goal directed.  Expressive speech was fluent.  Conversational speech was intact.  There was no evidence of psychosis or delusions.  Jonathan [name] explicitly denied SI and HI.    Intervention:  Health and behavior intervention    Symptoms:  sadness  I provided cognitive reframing aimed at establishing a positive view of self    Treatment Plan/Recommendations: Weekly behavioral intervention    Hulda JINNY Ahle, PsyD

## 2024-07-09 ENCOUNTER — Ambulatory Visit: Admitting: Clinical Neuropsychologist

## 2024-07-16 ENCOUNTER — Ambulatory Visit: Admission: RE | Admit: 2024-07-16 | Discharge: 2024-07-16 | Admitting: Clinical Neuropsychologist

## 2024-07-16 DIAGNOSIS — F329 Major depressive disorder, single episode, unspecified: Secondary | ICD-10-CM

## 2024-07-16 NOTE — Progress Notes (Signed)
 [facility]  Outpatient Neuro Rehabilitation Department  [address], [place] [address]  Phone: [phone]    Fax: [phone]                 NEUROPSYCHOLOGICAL TREATMENT PROGRESS NOTE      PATIENT: ELO MARMOLEJOS DOB: 1950-12-14   MRN: [id number]  AGE: 73 y.o.    Primary Language:English   Interpreter:         Referring Provider: Eppie Delroy NOVAK, NP   Therapy Diagnosis:   1. Major depressive disorder with single episode, remission status unspecified              Date of Service: 07/16/2024  Time Spent: 60 mins.       General Observations:    Lynwood JAYSON Kerns appeared alert and oriented to person, place, and time.  Lynwood JAYSON Kerns maintained eye contact and presented as cooperative and engaged throughout the course of the meeting.  Affect was appropriate for the situation.  Thought processes were logical and goal directed.  Expressive speech was fluent.  Conversational speech was intact.  There was no evidence of psychosis or delusions.  Lynwood JAYSON Kerns explicitly denied SI and HI.    Intervention:  Health and behavior intervention    Symptoms:  sadness  I provided cognitive reframing aimed at enhancing coping skills.    Treatment Plan/Recommendations: Weekly behavioral intervention    Hulda JINNY Ahle, PsyD

## 2024-07-23 ENCOUNTER — Ambulatory Visit: Admitting: Clinical Neuropsychologist

## 2024-07-30 ENCOUNTER — Ambulatory Visit: Admitting: Clinical Neuropsychologist

## 2024-08-06 ENCOUNTER — Ambulatory Visit: Attending: Specialist | Admitting: Clinical Neuropsychologist

## 2024-08-13 ENCOUNTER — Ambulatory Visit
Admission: RE | Admit: 2024-08-13 | Discharge: 2024-08-13 | Attending: Specialist | Admitting: Clinical Neuropsychologist

## 2024-08-13 DIAGNOSIS — S062X1S Diffuse traumatic brain injury with loss of consciousness of 30 minutes or less, sequela: Secondary | ICD-10-CM | POA: Insufficient documentation

## 2024-08-13 DIAGNOSIS — H5330 Unspecified disorder of binocular vision: Secondary | ICD-10-CM | POA: Insufficient documentation

## 2024-08-13 DIAGNOSIS — R278 Other lack of coordination: Secondary | ICD-10-CM | POA: Insufficient documentation

## 2024-08-13 DIAGNOSIS — F4321 Adjustment disorder with depressed mood: Secondary | ICD-10-CM | POA: Insufficient documentation

## 2024-08-13 DIAGNOSIS — F329 Major depressive disorder, single episode, unspecified: Secondary | ICD-10-CM | POA: Insufficient documentation

## 2024-08-13 DIAGNOSIS — R413 Other amnesia: Secondary | ICD-10-CM | POA: Insufficient documentation

## 2024-08-13 DIAGNOSIS — F0781 Postconcussional syndrome: Secondary | ICD-10-CM | POA: Insufficient documentation

## 2024-08-13 DIAGNOSIS — X58XXXS Exposure to other specified factors, sequela: Secondary | ICD-10-CM | POA: Insufficient documentation

## 2024-08-13 DIAGNOSIS — R41844 Frontal lobe and executive function deficit: Secondary | ICD-10-CM | POA: Insufficient documentation

## 2024-08-13 DIAGNOSIS — S069XAA Unspecified intracranial injury with loss of consciousness status unknown, initial encounter: Secondary | ICD-10-CM | POA: Insufficient documentation

## 2024-08-13 NOTE — Progress Notes (Signed)
 [facility]  Outpatient Neuro Rehabilitation Department  [address] [place] [address]  Phone: [phone]    Fax: [phone]                 NEUROPSYCHOLOGICAL TREATMENT PROGRESS NOTE      PATIENT: Jonathan [name] DOB: [birth year]   MRN: [id number]  AGE: 74 y.o.    Primary Language:English   Interpreter:         Referring Provider: Eppie Delroy NOVAK, NP   Therapy Diagnosis:   1. Major depressive disorder with single episode, remission status unspecified              Date of Service: 08/13/2024  Time Spent: 60 mins.       General Observations:    Jonathan [name] appeared alert and oriented to person, place, and time.  Jonathan [name] maintained eye contact and presented as cooperative and engaged throughout the course of the meeting.  Affect was appropriate for the situation.  Thought processes were logical and goal directed.  Expressive speech was fluent.  Conversational speech was intact.  There was no evidence of psychosis or delusions.  Jonathan [name] explicitly denied SI and HI.    Intervention:  Health and behavior intervention    Symptoms:  sadness  I provided cognitive reframing aimed at enhancing coping skills.    Treatment Plan/Recommendations: Weekly behavioral intervention    Hulda JINNY Ahle, PsyD

## 2024-08-20 ENCOUNTER — Ambulatory Visit: Admitting: Clinical Neuropsychologist

## 2024-08-27 ENCOUNTER — Ambulatory Visit
Admission: RE | Admit: 2024-08-27 | Discharge: 2024-08-27 | Disposition: A | Discharge status: Home / Self Care | Source: Ambulatory Visit | Admitting: Clinical Neuropsychologist

## 2024-08-27 DIAGNOSIS — F329 Major depressive disorder, single episode, unspecified: Secondary | ICD-10-CM

## 2024-08-27 NOTE — Progress Notes (Signed)
 [facility]  Outpatient Neuro Rehabilitation Department  [address] [place] [address]  Phone: [phone]    Fax: [phone]                 NEUROPSYCHOLOGICAL TREATMENT PROGRESS NOTE      PATIENT: Jonathan [name] DOB: 09-Nov-1950   MRN: [id number]  AGE: 74 y.o.    Primary Language:English   Interpreter:         Referring Provider: Eppie Delroy NOVAK, NP   Therapy Diagnosis:   1. Major depressive disorder with single episode, remission status unspecified              Date of Service: 08/27/2024  Time Spent: 60 mins.       General Observations:    Jonathan [name] appeared alert and oriented to person, place, and time.  Jonathan [name] maintained eye contact and presented as cooperative and engaged throughout the course of the meeting.  Affect was appropriate for the situation.  Thought processes were logical and goal directed.  Expressive speech was fluent.  Conversational speech was intact.  There was no evidence of psychosis or delusions.  Jonathan [name] explicitly denied SI and HI.    Intervention:  Health and behavior intervention    Symptoms:  sadness  I provided cognitive reframing aimed at enhancing coping skills.    Treatment Plan/Recommendations: Weekly  behavioral intervention    Hulda JINNY Ahle, PsyD

## 2024-09-03 ENCOUNTER — Ambulatory Visit
Admission: RE | Admit: 2024-09-03 | Discharge: 2024-09-03 | Disposition: A | Discharge status: Home / Self Care | Source: Ambulatory Visit | Attending: Specialist | Admitting: Specialist

## 2024-09-03 DIAGNOSIS — F329 Major depressive disorder, single episode, unspecified: Secondary | ICD-10-CM

## 2024-09-03 NOTE — Progress Notes (Signed)
 [facility]  Outpatient Neuro Rehabilitation Department  [address], [place] [address]  Phone: [phone]    Fax: [phone]                 NEUROPSYCHOLOGICAL TREATMENT PROGRESS NOTE      PATIENT: Jonathan [name] DOB: Feb 03, 1951   MRN: [id number]  AGE: 74 y.o.    Primary Language:English   Interpreter:         Referring Provider: Eppie Delroy NOVAK, NP   Therapy Diagnosis:   1. Major depressive disorder with single episode, remission status unspecified              Date of Service: 09/03/2024  Time Spent: 60 mins.       General Observations:    Jonathan [name] appeared alert and oriented to person, place, and time.  Jonathan [name] maintained eye contact and presented as cooperative and engaged throughout the course of the meeting.  Affect was appropriate for the situation.  Thought processes were logical and goal directed.  Expressive speech was fluent.  Conversational speech was intact.  There was no evidence of psychosis or delusions.  Jonathan [name] explicitly denied SI and HI.    Intervention:  Health and behavior intervention    Symptoms:  sadness  I provided cognitive reframing aimed at enhancing coping skills.    Treatment Plan/Recommendations: Weekly behavioral intervention    Hulda JINNY Ahle, PsyD

## 2024-09-10 ENCOUNTER — Ambulatory Visit: Admitting: Clinical Neuropsychologist

## 2024-09-17 ENCOUNTER — Ambulatory Visit: Admitting: Clinical Neuropsychologist

## 2024-09-24 ENCOUNTER — Ambulatory Visit: Admitting: Clinical Neuropsychologist

## 2024-10-01 ENCOUNTER — Ambulatory Visit: Attending: Specialist | Admitting: Clinical Neuropsychologist

## 2024-10-08 ENCOUNTER — Ambulatory Visit: Admitting: Clinical Neuropsychologist

## 2024-10-15 ENCOUNTER — Ambulatory Visit: Admitting: Clinical Neuropsychologist

## 2024-10-22 ENCOUNTER — Ambulatory Visit: Admitting: Clinical Neuropsychologist

## 2024-10-29 ENCOUNTER — Ambulatory Visit: Admitting: Clinical Neuropsychologist

## 2024-11-05 ENCOUNTER — Ambulatory Visit: Attending: Specialist | Admitting: Clinical Neuropsychologist

## 2024-11-12 ENCOUNTER — Ambulatory Visit: Admitting: Clinical Neuropsychologist

## 2024-11-19 ENCOUNTER — Ambulatory Visit: Admitting: Clinical Neuropsychologist

## 2024-11-26 ENCOUNTER — Ambulatory Visit: Admitting: Clinical Neuropsychologist

## 2024-12-03 ENCOUNTER — Ambulatory Visit: Attending: Specialist | Admitting: Clinical Neuropsychologist

## 2024-12-10 ENCOUNTER — Ambulatory Visit: Admitting: Clinical Neuropsychologist

## 2024-12-17 ENCOUNTER — Ambulatory Visit: Admitting: Clinical Neuropsychologist

## 2024-12-24 ENCOUNTER — Ambulatory Visit: Admitting: Clinical Neuropsychologist

## 2024-12-31 ENCOUNTER — Ambulatory Visit: Attending: Specialist | Admitting: Clinical Neuropsychologist

## 2025-01-07 ENCOUNTER — Ambulatory Visit: Admitting: Clinical Neuropsychologist

## 2025-01-14 ENCOUNTER — Ambulatory Visit: Admitting: Clinical Neuropsychologist

## 2025-01-21 ENCOUNTER — Ambulatory Visit: Admitting: Clinical Neuropsychologist

## 2025-01-28 ENCOUNTER — Ambulatory Visit: Admitting: Clinical Neuropsychologist

## 2025-02-04 ENCOUNTER — Ambulatory Visit: Attending: Specialist | Admitting: Clinical Neuropsychologist

## 2025-02-11 ENCOUNTER — Ambulatory Visit: Admitting: Clinical Neuropsychologist

## 2025-02-18 ENCOUNTER — Ambulatory Visit: Admitting: Clinical Neuropsychologist

## 2025-02-25 ENCOUNTER — Ambulatory Visit: Admitting: Clinical Neuropsychologist

## 2025-03-04 ENCOUNTER — Ambulatory Visit: Attending: Specialist | Admitting: Clinical Neuropsychologist

## 2025-03-11 ENCOUNTER — Ambulatory Visit: Admitting: Clinical Neuropsychologist

## 2025-03-18 ENCOUNTER — Ambulatory Visit: Admitting: Clinical Neuropsychologist

## 2025-03-25 ENCOUNTER — Ambulatory Visit: Admitting: Clinical Neuropsychologist
